# Patient Record
Sex: Female | Born: 1970 | Race: Black or African American | Hispanic: No | Smoking: Never smoker
Health system: Southern US, Community
[De-identification: ages and names within clinical notes are randomized; demographics above are authoritative.]

## PROBLEM LIST (undated history)

## (undated) DIAGNOSIS — E739 Lactose intolerance, unspecified: Secondary | ICD-10-CM

## (undated) DIAGNOSIS — I10 Essential (primary) hypertension: Secondary | ICD-10-CM

## (undated) DIAGNOSIS — K429 Umbilical hernia without obstruction or gangrene: Secondary | ICD-10-CM

## (undated) DIAGNOSIS — D259 Leiomyoma of uterus, unspecified: Secondary | ICD-10-CM

## (undated) DIAGNOSIS — Z86010 Personal history of colonic polyps: Secondary | ICD-10-CM

## (undated) DIAGNOSIS — G43109 Migraine with aura, not intractable, without status migrainosus: Secondary | ICD-10-CM

## (undated) DIAGNOSIS — D126 Benign neoplasm of colon, unspecified: Secondary | ICD-10-CM

## (undated) DIAGNOSIS — R109 Unspecified abdominal pain: Secondary | ICD-10-CM

## (undated) DIAGNOSIS — K589 Irritable bowel syndrome without diarrhea: Secondary | ICD-10-CM

## (undated) DIAGNOSIS — D649 Anemia, unspecified: Secondary | ICD-10-CM

## (undated) DIAGNOSIS — A749 Chlamydial infection, unspecified: Secondary | ICD-10-CM

## (undated) DIAGNOSIS — K219 Gastro-esophageal reflux disease without esophagitis: Secondary | ICD-10-CM

## (undated) DIAGNOSIS — K449 Diaphragmatic hernia without obstruction or gangrene: Secondary | ICD-10-CM

## (undated) DIAGNOSIS — A599 Trichomoniasis, unspecified: Secondary | ICD-10-CM

## (undated) DIAGNOSIS — J45909 Unspecified asthma, uncomplicated: Secondary | ICD-10-CM

## (undated) DIAGNOSIS — T7840XA Allergy, unspecified, initial encounter: Secondary | ICD-10-CM

## (undated) DIAGNOSIS — G4700 Insomnia, unspecified: Secondary | ICD-10-CM

## (undated) DIAGNOSIS — K5792 Diverticulitis of intestine, part unspecified, without perforation or abscess without bleeding: Secondary | ICD-10-CM

## (undated) DIAGNOSIS — K5909 Other constipation: Secondary | ICD-10-CM

## (undated) HISTORY — PX: DILATION AND CURETTAGE OF UTERUS: SHX78

## (undated) HISTORY — DX: Unspecified asthma, uncomplicated: J45.909

## (undated) HISTORY — PX: COLONOSCOPY: SHX174

## (undated) HISTORY — DX: Diverticulitis of intestine, part unspecified, without perforation or abscess without bleeding: K57.92

## (undated) HISTORY — DX: Gastro-esophageal reflux disease without esophagitis: K21.9

## (undated) HISTORY — DX: Unspecified abdominal pain: R10.9

## (undated) HISTORY — DX: Umbilical hernia without obstruction or gangrene: K42.9

## (undated) HISTORY — PX: TUBAL LIGATION: SHX77

## (undated) HISTORY — DX: Lactose intolerance, unspecified: E73.9

## (undated) HISTORY — DX: Migraine with aura, not intractable, without status migrainosus: G43.109

## (undated) HISTORY — DX: Chlamydial infection, unspecified: A74.9

## (undated) HISTORY — DX: Personal history of colonic polyps: Z86.010

## (undated) HISTORY — DX: Essential (primary) hypertension: I10

## (undated) HISTORY — DX: Anemia, unspecified: D64.9

## (undated) HISTORY — PX: APPENDECTOMY: SHX54

## (undated) HISTORY — DX: Trichomoniasis, unspecified: A59.9

## (undated) HISTORY — DX: Irritable bowel syndrome, unspecified: K58.9

## (undated) HISTORY — DX: Allergy, unspecified, initial encounter: T78.40XA

## (undated) HISTORY — DX: Diaphragmatic hernia without obstruction or gangrene: K44.9

## (undated) HISTORY — DX: Other constipation: K59.09

## (undated) HISTORY — DX: Benign neoplasm of colon, unspecified: D12.6

## (undated) HISTORY — DX: Leiomyoma of uterus, unspecified: D25.9

---

## 2006-06-07 ENCOUNTER — Emergency Department (HOSPITAL_COMMUNITY): Admission: EM | Admit: 2006-06-07 | Discharge: 2006-06-08 | Payer: Self-pay | Admitting: Emergency Medicine

## 2006-07-14 ENCOUNTER — Ambulatory Visit: Payer: Self-pay | Admitting: Obstetrics & Gynecology

## 2006-07-22 ENCOUNTER — Emergency Department (HOSPITAL_COMMUNITY): Admission: EM | Admit: 2006-07-22 | Discharge: 2006-07-22 | Payer: Self-pay | Admitting: Emergency Medicine

## 2006-08-13 ENCOUNTER — Ambulatory Visit: Payer: Self-pay | Admitting: Gastroenterology

## 2006-08-13 LAB — CONVERTED CEMR LAB
AST: 19 units/L (ref 0–37)
BUN: 10 mg/dL (ref 6–23)
Basophils Absolute: 0.1 10*3/uL (ref 0.0–0.1)
CO2: 27 meq/L (ref 19–32)
Calcium: 9.1 mg/dL (ref 8.4–10.5)
Chloride: 107 meq/L (ref 96–112)
Creatinine, Ser: 0.7 mg/dL (ref 0.4–1.2)
Folate: 9.2 ng/mL
GFR calc non Af Amer: 101 mL/min
Glucose, Bld: 111 mg/dL — ABNORMAL HIGH (ref 70–99)
Hemoglobin: 12.5 g/dL (ref 12.0–15.0)
MCV: 84.3 fL (ref 78.0–100.0)
Potassium: 3.8 meq/L (ref 3.5–5.1)
RBC: 4.34 M/uL (ref 3.87–5.11)
RDW: 12.8 % (ref 11.5–14.6)
Sed Rate: 29 mm/hr — ABNORMAL HIGH (ref 0–25)
Sodium: 141 meq/L (ref 135–145)
TSH: 1.19 microintl units/mL (ref 0.35–5.50)
Vitamin B-12: 300 pg/mL (ref 211–911)

## 2006-10-21 ENCOUNTER — Ambulatory Visit: Payer: Self-pay | Admitting: Obstetrics & Gynecology

## 2006-10-21 ENCOUNTER — Other Ambulatory Visit: Admission: RE | Admit: 2006-10-21 | Discharge: 2006-10-21 | Payer: Self-pay | Admitting: Obstetrics & Gynecology

## 2006-10-22 ENCOUNTER — Encounter: Payer: Self-pay | Admitting: Obstetrics & Gynecology

## 2006-11-19 ENCOUNTER — Ambulatory Visit: Payer: Self-pay | Admitting: Obstetrics & Gynecology

## 2007-01-05 ENCOUNTER — Ambulatory Visit: Payer: Self-pay | Admitting: Obstetrics and Gynecology

## 2007-01-10 ENCOUNTER — Ambulatory Visit (HOSPITAL_COMMUNITY): Admission: RE | Admit: 2007-01-10 | Discharge: 2007-01-10 | Payer: Self-pay | Admitting: Obstetrics & Gynecology

## 2007-05-11 DIAGNOSIS — R109 Unspecified abdominal pain: Secondary | ICD-10-CM | POA: Insufficient documentation

## 2007-05-11 DIAGNOSIS — K5909 Other constipation: Secondary | ICD-10-CM

## 2009-04-24 ENCOUNTER — Ambulatory Visit: Payer: Self-pay | Admitting: Obstetrics and Gynecology

## 2010-03-13 ENCOUNTER — Emergency Department (HOSPITAL_COMMUNITY): Payer: Medicaid Other

## 2010-03-13 ENCOUNTER — Emergency Department (HOSPITAL_COMMUNITY)
Admission: EM | Admit: 2010-03-13 | Discharge: 2010-03-13 | Disposition: A | Discharge status: Home / Self Care | Payer: Medicaid Other | Attending: Emergency Medicine | Admitting: Emergency Medicine

## 2010-03-13 DIAGNOSIS — M79609 Pain in unspecified limb: Secondary | ICD-10-CM | POA: Insufficient documentation

## 2010-03-13 DIAGNOSIS — X58XXXA Exposure to other specified factors, initial encounter: Secondary | ICD-10-CM | POA: Insufficient documentation

## 2010-03-13 DIAGNOSIS — M6580 Other synovitis and tenosynovitis, unspecified site: Secondary | ICD-10-CM | POA: Insufficient documentation

## 2010-06-10 NOTE — Assessment & Plan Note (Signed)
[facility]                         GASTROENTEROLOGY OFFICE NOTE   NAME:Briana Wade, Briana Wade                     MRN:          [id number]  DATE:08/13/2006                            DOB:          Oct 20, 1970    NEW PATIENT CONSULTATION   Briana Wade is a 40 year old African-American female referred by Dr.  Penne Lash for evaluation of lower abdominal discomfort.   HISTORY OF PRESENT ILLNESS:  This patient has had recurrent spells of  crampy lower abdominal pain with gas, bloating, mild constipation for  the last several years.  She has apparently been in the emergency room  on multiple occasions without any diagnosis being determined.  She has  recently been seen at the [facility] by Dr. Elsie Lincoln on July 14, 2006.  She was felt to have some uterine fibroids.  She placed her  on Depo-Provera as a symptomatic trial.  The patient apparently has had  recent pelvic ultrasound, which has shown fibroids.   The patient is status post appendectomy some 20 years ago and has had a  tubal ligation.  She never had other gastrointestinal surgeries,  endoscopic procedures, or x-rays.  She has some mild acid reflux  symptoms and nausea, but has had no associated dysphagia, clay-colored  stools, dark urine, icterus, et Karie Soda.  She denies any history of  hepatitis or pancreatitis.  She follows a regular diet, but does have  known lactose intolerance.  She denies use of sorbitol or fructose.  She  has had no anorexia, weight loss, fever, chills, skin rashes, joint  pains, or other systemic symptoms.   PAST MEDICAL HISTORY:  Otherwise, noncontributory.   MEDICATIONS:  None.   No allergies.   FAMILY HISTORY:  Noncontributory.   SOCIAL HISTORY:  The patient is single and has 3 children.  She denies  ever using alcohol or cigarettes.  She gives no history of drug  dependency.   REVIEW OF SYSTEMS:  Noncontributory, except for some painful periods and  deconditioning with shortness of breath on exertion.   EXAMINATION:  She is a healthy-appearing black female appearing her  stated age in no distress.  I cannot appreciate stigmata of chronic  liver disease.  There is no thyromegaly or lymphadenopathy.  She is 5 feet 2 inches tall and weighs 197 pounds.  Blood pressure  120/84, pulse 80 and regular.  CHEST:  Entirely clear and she was in a regular rhythm without murmurs,  gallops, or rubs.  ABDOMEN:  Obese, but I could appreciate no definite organomegaly, mass,  or significant tenderness.  Peripheral extremities were unremarkable.  Mental status was clear.  Inspection of the rectum was unremarkable, as was rectal exam.  Soft  stool present.  Guaiac negative.   ASSESSMENT:  I think Mrs. 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   RECOMMENDATIONS:  1. High fiber diet with daily Benefiber and liberal p.o. fluids.  2. Scheduled her for outpatient colonoscopy exam.  3. Check screening laboratory parameters.  4. Trial of p.r.n. Bentyl 10 mg every 6 to 8 hours.  5. P.r.n.  Lactaid tablet use with major dairy products.  6. Continue other medications per Dr. Penne Lash.     Vania Rea. Jarold Motto, MD, Caleen Essex, FAGA  Electronically Signed    DRP/MedQ  DD: 08/13/2006  DT: 08/13/2006  Job #: [id number]   cc:   Lesly Dukes, M.D.

## 2010-06-10 NOTE — Group Therapy Note (Signed)
NAME:  Briana Wade, Briana Wade              ACCOUNT NO.:  [id number]   MEDICAL RECORD NO.:  [id number]          PATIENT TYPE:  WOC   LOCATION:  [facility]                   FACILITY:  [facility]   PHYSICIAN:  Johnella Moloney, MD        DATE OF BIRTH:  [birth year]   DATE OF SERVICE:  10/21/2006                                  CLINIC NOTE   HISTORY OF PRESENT ILLNESS:  Patient is a 40 year old G 10, P 3-0-7-3,  who presents for followup.  She was last seen by Dr. Elsie Lincoln on  July 14, 2006, for pelvic pain.  Of note, patient had an ultrasound  which showed multiple uterine fibroids, the largest measuring 2 cm, and  a posterior uterine fundus.  At disposition was complaining of getting  very bloated and had alternating episodes of constipation and diarrhea.  An appointment was made for her with a gastroenterologist, and patient  did follow up on this appointment but was told to follow up for a  colonoscopy, which she did not follow up for.  Patient denies any  further pelvic pain or gastrointestinal symptoms at this visit.  However, given her pelvic pain, the patient was started on Depo-Provera  and got Depo-Provera 150 mg at her last visit.  She was told to come  back in six weeks for evaluation, but patient missed this visit.  Patient now reports that she has been having irregular bleeding for the  past five weeks where she has bleeding every day, it has been running  from spotting to using 3-4 pads a day.  She feels like this has  something to do with her Depo-Provera; this bleeding is not associated  with any pain or any other symptoms.  No interval change in medical  history.   PHYSICAL EXAMINATION:  VITAL SIGNS:  Temperature is 98.1, pulse 82,  blood pressure 134/90, weight 200.4 pounds.  GENERAL:  No apparent distress.  ABDOMEN:  Soft, obese, nontender.   Given patient's irregular bleeding, she was counseled regarding an  endometrial biopsy, and all questions were answered, informed  consent  was signed.   PELVIC:  She had normal external female genitalia.  Cervix was closed,  nontender.  Uterine size difficult to appreciate secondary to habitus.   ENDOMETRIAL BIOPSY PROCEDURE:  Patient was consented for endometrial  biopsy.  The 3-mm Pipelle was introduced into the patient's cervix to a  depth of 9 cm.  One pass was made, and a moderate amount of tissue was  recovered and sent to pathology for analysis.  Small amount of bleeding  noted at end of procedure.   ASSESSMENT AND PLAN:  Patient is a 40 year old with a fibroid uterus,  now with irregular bleeding.  Patient is now status post endometrial  biopsy.  Will follow up results.  If there is not a concern for  endometrial hyperplasia or further concerns on the endometrial biopsy,  patient will be a good candidate for the Mirena IUD.  She was given  information to review at home regarding the Mirena IUD and will decide  by her next visit if she wants this to  be placed.  The other options  that were discussed with the patient were continuing on her Depo-  Provera, further  hormonal treatment, or surgical management.  She will think about these  options and decide by her next visit.  Of note, patient reported that  she was not currently bleeding at this visit.           ______________________________  Johnella Moloney, MD     UD/MEDQ  D:  10/21/2006  T:  10/22/2006  Job:  [id number]
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
## 2010-06-10 NOTE — Group Therapy Note (Signed)
NAME:  Briana Wade, Briana Wade              ACCOUNT NO.:  [id number]   MEDICAL RECORD NO.:  [id number]          PATIENT TYPE:  WOC   LOCATION:  [facility]                   FACILITY:  [facility]   PHYSICIAN:  Johnella Moloney, MD        DATE OF BIRTH:  1970/06/04   DATE OF SERVICE:  11/19/2006                                  CLINIC NOTE   CHIEF COMPLAINT:  Followup results of endometrial biopsy and pelvic  pain.   HISTORY OF PRESENT ILLNESS:  This is a 40 year old gravida 10, para 3-0-  7-3, presenting for followup.  She was seen last by Dr. Silas Flood on  October 21, 2006.  Since that time she has had one visit which she  failed to keep on November 04, 2006.  Patient has a history of uterine  fibroids, the largest measuring 2 cm.  Patient has been seen in the past  for pelvic pain related to her menstrual cycles and was started on Depo-  Provera in June, 2008.  Patient reports that after her Depo shot she  began bleeding every day for the next 5 weeks.  She was assessed by Dr.  Silas Flood with an endometrial biopsy showing fragmented proliferative type  endometrium with no hyperplasia or malignancy identified.  Patient  states she has not had any bleeding since she was last seen here in  clinic.  She just began spotting yesterday and believes she is beginning  her menstrual cycle.  Prior to being on Depo she was having monthly  cycles in which she would bleed for 5 days and uses 3 to 4 pads a day.  She states that she currently would like to see how her periods progress  after she has been off Depo for 1 month.  At this time she would not  like any interventions, including oral contraceptive pills, Mirena IUD  or surgery.  The patient states that she would like to follow up if her  bleeding continues to worsen or pelvic pain returns.   PAST MEDICAL HISTORY:  1. Uterine fibroids.  2. No other medical problems.  3. Medications:  Tylenol p.r.n.   ALLERGIES:  No known diagnosed allergies.   PHYSICAL  EXAMINATION:  GENERAL:  This is a well-appearing African-  American female in no distress.  VITALS:  Pulse of 77, blood pressure 130/83, weight is 206 pounds.  CARDIOVASCULAR:  Heart is regular rate and rhythm.  No murmurs, rubs or  gallops.  RESPIRATORY:  Lungs are clear to auscultation bilaterally.  ABDOMEN:  Soft, obese, nontender to palpation.  EXTREMITIES:  No tenderness or edema.   ASSESSMENT AND PLAN:  This is a 40 year old female with a fibroid uterus  with history of irregular bleeding after Depo-Provera.  She has now been  without bleeding for the past four weeks.  Patient states she would not  like to try any new interventions at this time but if her pelvic pain  returns or if she develops more regular bleeding would like to follow up  to discuss her options.  Options discussed today included hormonal  therapy, Mirena intrauterine  device, and the need to  follow up for consideration of surgery.  The  patient states she will follow up as needed based on her symptoms.     ______________________________  Karlton Lemon, MD    ______________________________  Johnella Moloney, MD    NS/MEDQ  D:  11/19/2006  T:  11/19/2006  Job:  [id number]

## 2010-06-10 NOTE — Group Therapy Note (Signed)
NAME:  Briana Wade, Briana Wade NO.:  [id number]   MEDICAL RECORD NO.:  [id number]          PATIENT TYPE:  WOC   LOCATION:  [facility]                   FACILITY:  [facility]   PHYSICIAN:  Argentina Donovan, MD        DATE OF BIRTH:  February 08, 1970   DATE OF SERVICE:                                  CLINIC NOTE   The patient is a 40 year old African American female gravida 10, para 3-  0-7-3 with a history of tubal ligation who has had intractable  menometrorrhagia for almost 1 year now.  She has been tried on Depo-  Provera, which seem to have made it worse.  She has had an ultrasound  which showed a normal-sized uterus with a couple of small leiomyomata,  the largest 2 cm in diameter.   She has no other medical problems in the past.  She also has been  complaining of some left upper quadrant pain.  In an examination, her  abdomen is distended, tympanic.  She has no history of diarrhea,  constipation.  She does not pass more than normal flatus.  Her diet does  not seem to be something that would increase her intestinal gas to that  degree.  We are going to get a CT of the abdomen.  We are scheduling the  patient after giving her the full literature discussing in detail the  possible failures and complications of endometrial ablation.  This  uterus looks pretty normal, so were going to schedule her for a NovaSure  to try and see if that will relieve that problem for her.  If we find  nothing on the CT, we would probably have to refer the patient to  gastroenterologist, as she says she has had this problem with bloating  for almost 2 years.   IMPRESSION:  1. Intractable menometrorrhagia.  2. Abdominal bloating.           ______________________________  Argentina Donovan, MD     PR/MEDQ  D:  01/05/2007  T:  01/06/2007  Job:  [id number]

## 2010-07-02 ENCOUNTER — Other Ambulatory Visit: Payer: Self-pay | Admitting: Obstetrics and Gynecology

## 2010-07-02 ENCOUNTER — Ambulatory Visit (INDEPENDENT_AMBULATORY_CARE_PROVIDER_SITE_OTHER): Payer: Medicaid Other | Admitting: Obstetrics and Gynecology

## 2010-07-02 DIAGNOSIS — Z01419 Encounter for gynecological examination (general) (routine) without abnormal findings: Secondary | ICD-10-CM

## 2010-07-02 DIAGNOSIS — Z124 Encounter for screening for malignant neoplasm of cervix: Secondary | ICD-10-CM

## 2010-07-02 DIAGNOSIS — Z1231 Encounter for screening mammogram for malignant neoplasm of breast: Secondary | ICD-10-CM

## 2010-07-03 NOTE — Group Therapy Note (Signed)
NAME:  Briana Wade, Briana Wade NO.:  [id number]  MEDICAL RECORD NO.:  [id number]           PATIENT TYPE:  A  LOCATION:  [facility]                   FACILITY:  [facility]  PHYSICIAN:  Argentina Donovan, MD        DATE OF BIRTH:  Oct 25, 1970  DATE OF SERVICE:  07/02/2010                                 CLINIC NOTE  The patient is a 40 year old African American female gravida 10, para 3- 0-7-3 in for annual examination.  Her biggest concern is abdominal bloating.  She was scheduled for a colonoscopy 2 years ago but never got it done because she got ill.  So, we are going to go ahead and reschedule her to see a gastroenterologist.  At that time, we told her she had irritable colon.  She has had no abnormal bleeding and no other significant complaints.  PHYSICAL EXAMINATION:  NECK:  Her thyroid is symmetrical with no dominant masses. BREASTS:  Symmetrical.  No dominant masses.  No nipple discharge.  No supraclavicular or axillary nodes. ABDOMEN:  Soft, obtunded, tympanic, nontender.  No masses or organomegaly palpated. GENITALIA:  External os is normal.  BUS within normal limits.  Vagina is clean and well rugated.  The cervix is clean and parous.  Pap smear was taken.  The uterus appears to be of normal size, shape, consistency.  The patient's blood pressure today is 145/105 and 145/97.  It has always been normal one taken before.  So, I am going to repeat that prior to her discharge and if necessary refer her to the medical clinic.  IMPRESSION:  Normal gynecological examination, possible hypertension.          ______________________________ Argentina Donovan, MD    PR/MEDQ  D:  07/02/2010  T:  07/03/2010  Job:  [id number]

## 2010-07-16 ENCOUNTER — Ambulatory Visit (HOSPITAL_COMMUNITY)
Admission: RE | Admit: 2010-07-16 | Discharge: 2010-07-16 | Disposition: A | Discharge status: Home / Self Care | Payer: Medicaid Other | Source: Ambulatory Visit | Attending: Obstetrics and Gynecology | Admitting: Obstetrics and Gynecology

## 2010-07-16 DIAGNOSIS — Z1231 Encounter for screening mammogram for malignant neoplasm of breast: Secondary | ICD-10-CM | POA: Insufficient documentation

## 2010-07-21 ENCOUNTER — Encounter: Payer: Self-pay | Admitting: Gastroenterology

## 2010-07-22 ENCOUNTER — Inpatient Hospital Stay (HOSPITAL_COMMUNITY)
Admission: EM | Admit: 2010-07-22 | Discharge: 2010-07-24 | DRG: 103 | Disposition: A | Discharge status: Home / Self Care | Payer: Medicaid Other | Attending: Internal Medicine | Admitting: Internal Medicine

## 2010-07-22 DIAGNOSIS — I10 Essential (primary) hypertension: Secondary | ICD-10-CM | POA: Diagnosis present

## 2010-07-22 DIAGNOSIS — G44209 Tension-type headache, unspecified, not intractable: Principal | ICD-10-CM | POA: Diagnosis present

## 2010-07-23 ENCOUNTER — Emergency Department (HOSPITAL_COMMUNITY): Payer: Medicaid Other

## 2010-07-23 ENCOUNTER — Encounter (HOSPITAL_COMMUNITY): Payer: Self-pay

## 2010-07-23 ENCOUNTER — Inpatient Hospital Stay (HOSPITAL_COMMUNITY): Payer: Medicaid Other

## 2010-07-23 LAB — DIFFERENTIAL
Basophils Relative: 0 % (ref 0–1)
Eosinophils Absolute: 0.1 10*3/uL (ref 0.0–0.7)
Eosinophils Relative: 2 % (ref 0–5)
Lymphs Abs: 2.4 10*3/uL (ref 0.7–4.0)
Monocytes Relative: 9 % (ref 3–12)

## 2010-07-23 LAB — BASIC METABOLIC PANEL
Calcium: 9.5 mg/dL (ref 8.4–10.5)
Creatinine, Ser: 0.77 mg/dL (ref 0.50–1.10)
Glucose, Bld: 98 mg/dL (ref 70–99)
Sodium: 135 mEq/L (ref 135–145)

## 2010-07-23 LAB — CBC
Hemoglobin: 11.9 g/dL — ABNORMAL LOW (ref 12.0–15.0)
MCH: 27.9 pg (ref 26.0–34.0)
MCHC: 33 g/dL (ref 30.0–36.0)
RDW: 13.3 % (ref 11.5–15.5)

## 2010-07-23 LAB — TSH: TSH: 2.52 u[IU]/mL (ref 0.350–4.500)

## 2010-07-23 MED ORDER — GADOBENATE DIMEGLUMINE 529 MG/ML IV SOLN
20.0000 mL | Freq: Once | INTRAVENOUS | Status: AC | PRN
  Administered 2010-07-23: 20 mL via INTRAVENOUS

## 2010-07-24 LAB — BASIC METABOLIC PANEL
GFR calc Af Amer: >60 mL/min (ref >60)
GFR calc non Af Amer: >60 mL/min (ref >60)
Glucose, Bld: 126 mg/dL — ABNORMAL HIGH (ref 70–99)
Sodium: 137 mEq/L (ref 135–145)

## 2010-07-26 NOTE — H&P (Signed)
  NAMEMACEY, WURTZ NO.:  [id number]  MEDICAL RECORD NO.:  [id number]  LOCATION:  1337                         FACILITY:  [facility]  PHYSICIAN:  Briana Siren, MD           DATE OF BIRTH:  [birth year]  DATE OF ADMISSION:  07/22/2010 DATE OF DISCHARGE:                             HISTORY & PHYSICAL   PRIMARY CARE PHYSICIAN:  None.  REASON FOR ADMISSION:  Intractable headache.  ADVANCE DIRECTIVE:  Full code.  HISTORY OF PRESENT ILLNESS:  [name] is a 40 year old female with a history of hypertension, on no chronic medication; migraine headache; question of prior CVA who presented to the emergency room with 4 days of vertex headache.  She also has mild nausea, but not having severe vomiting.  Her complaint overall was rather vague, but she complained of no focal weakness, stiff neck, visual changes, or slurred speech.  She has not had any fever or chills.  She denied any distant travel or any ill contacts.  Evaluation in the emergency room showed an old lacunar infarct with her head CT.  She has a normal creatinine and a normal Hegna count.  She was given pain medication; however, continued to have headache and nausea, and hospitalist was thus asked to admit her for intractable headache.  In the past, she has much milder headaches and much rarer.  PAST MEDICAL HISTORY:  Hypertension.  SOCIAL HISTORY:  She is currently unemployed.  She is not married.  She has children.  She denied tobacco, alcohol, or drug use.  ALLERGIES:  No known drug allergies.  CURRENT MEDICATIONS:  On no chronic medications.  FAMILY HISTORY:  Noncontributory.  PHYSICAL EXAMINATION:  VITAL SIGNS:  Blood pressure 115 over 103, original blood pressure 136/85; pulse 80, respiratory rate of 16; temperature 98.3. GENERAL:  She is alert and conversing. HEENT:  She has facial symmetry with her speech is slightly slurred. Tongue is midline.  Uvula elevated with phonation.  Pupils equal,  round, reactive to light and accommodation.  Funduscopic exam is benign. Throat is clear. NECK:  Supple. CARDIAC:  S1, S2.  Regular.  I did not hear any murmur, rub, or gallop. LUNGS:  Clear. ABDOMEN:  Soft, nondistended, nontender. EXTREMITIES:  No edema.  She has good distal pulses bilaterally.  Her strength equal. NEURO:  Cerebellar exam is intact.  OBJECTIVE FINDINGS:  As above.  IMPRESSION:  This is a 40 year old female with known history of migraine and hypertension presenting with intractable headache.  I suspect that she has status migrainous. It is highly unlikely that she has an intracranial infection.  We will get her admitted for pain control.  I like to get an MRI with and without contrast of her head, given that her pattern of her migraine has changed.  We will admit her to [facility].  She is stable.     Briana Siren, MD     PL/MEDQ  D:  07/23/2010  T:  07/23/2010  Job:  [id number]  Electronically Signed by Briana Wade  on 07/26/2010 01:22:22 AM

## 2010-08-05 ENCOUNTER — Emergency Department (HOSPITAL_COMMUNITY): Payer: Medicaid Other

## 2010-08-05 ENCOUNTER — Emergency Department (HOSPITAL_COMMUNITY)
Admission: EM | Admit: 2010-08-05 | Discharge: 2010-08-05 | Disposition: A | Discharge status: Home / Self Care | Payer: Medicaid Other | Attending: Emergency Medicine | Admitting: Emergency Medicine

## 2010-08-05 DIAGNOSIS — I10 Essential (primary) hypertension: Secondary | ICD-10-CM | POA: Insufficient documentation

## 2010-08-05 DIAGNOSIS — R05 Cough: Secondary | ICD-10-CM | POA: Insufficient documentation

## 2010-08-05 DIAGNOSIS — G4440 Drug-induced headache, not elsewhere classified, not intractable: Secondary | ICD-10-CM | POA: Insufficient documentation

## 2010-08-05 DIAGNOSIS — R059 Cough, unspecified: Secondary | ICD-10-CM | POA: Insufficient documentation

## 2010-08-05 DIAGNOSIS — R0602 Shortness of breath: Secondary | ICD-10-CM | POA: Insufficient documentation

## 2010-08-21 ENCOUNTER — Encounter: Payer: Self-pay | Admitting: *Deleted

## 2010-08-26 ENCOUNTER — Ambulatory Visit (INDEPENDENT_AMBULATORY_CARE_PROVIDER_SITE_OTHER): Payer: Medicaid Other | Admitting: Gastroenterology

## 2010-08-26 ENCOUNTER — Other Ambulatory Visit: Payer: Medicaid Other

## 2010-08-26 ENCOUNTER — Other Ambulatory Visit (INDEPENDENT_AMBULATORY_CARE_PROVIDER_SITE_OTHER): Payer: Medicaid Other

## 2010-08-26 ENCOUNTER — Other Ambulatory Visit: Payer: Self-pay | Admitting: *Deleted

## 2010-08-26 ENCOUNTER — Encounter: Payer: Self-pay | Admitting: Gastroenterology

## 2010-08-26 DIAGNOSIS — E739 Lactose intolerance, unspecified: Secondary | ICD-10-CM

## 2010-08-26 DIAGNOSIS — R141 Gas pain: Secondary | ICD-10-CM

## 2010-08-26 DIAGNOSIS — R143 Flatulence: Secondary | ICD-10-CM

## 2010-08-26 DIAGNOSIS — R140 Abdominal distension (gaseous): Secondary | ICD-10-CM | POA: Insufficient documentation

## 2010-08-26 DIAGNOSIS — K589 Irritable bowel syndrome without diarrhea: Secondary | ICD-10-CM

## 2010-08-26 DIAGNOSIS — R109 Unspecified abdominal pain: Secondary | ICD-10-CM

## 2010-08-26 LAB — BASIC METABOLIC PANEL
BUN: 11 mg/dL (ref 6–23)
CO2: 28 mEq/L (ref 19–32)
Chloride: 103 mEq/L (ref 96–112)
Glucose, Bld: 82 mg/dL (ref 70–99)
Potassium: 4 mEq/L (ref 3.5–5.1)

## 2010-08-26 LAB — HEPATIC FUNCTION PANEL
AST: 17 U/L (ref 0–37)
Albumin: 4 g/dL (ref 3.5–5.2)
Alkaline Phosphatase: 58 U/L (ref 39–117)
Total Protein: 7.2 g/dL (ref 6.0–8.3)

## 2010-08-26 LAB — FERRITIN: Ferritin: 36.3 ng/mL (ref 10.0–291.0)

## 2010-08-26 LAB — CBC WITH DIFFERENTIAL/PLATELET
Basophils Absolute: 0 10*3/uL (ref 0.0–0.1)
Eosinophils Absolute: 0.1 10*3/uL (ref 0.0–0.7)
HCT: 40.7 % (ref 36.0–46.0)
Lymphocytes Relative: 36.8 % (ref 12.0–46.0)
Lymphs Abs: 1.6 10*3/uL (ref 0.7–4.0)
Monocytes Relative: 7.9 % (ref 3.0–12.0)
Platelets: 298 10*3/uL (ref 150.0–400.0)
RDW: 14.3 % (ref 11.5–14.6)

## 2010-08-26 LAB — AMYLASE: Amylase: 64 U/L (ref 27–131)

## 2010-08-26 LAB — LIPASE: Lipase: 30 U/L (ref 11.0–59.0)

## 2010-08-26 MED ORDER — RABEPRAZOLE SODIUM 20 MG PO TBEC: 20.0000 mg | DELAYED_RELEASE_TABLET | Freq: Every day | ORAL | Status: DC

## 2010-08-26 MED ORDER — ESOMEPRAZOLE MAGNESIUM 40 MG PO CPDR: 40.0000 mg | DELAYED_RELEASE_CAPSULE | Freq: Every day | ORAL | Status: DC

## 2010-08-26 MED ORDER — PEG-KCL-NACL-NASULF-NA ASC-C 100 G PO SOLR: 1.0000 | Freq: Once | ORAL | Status: DC

## 2010-08-26 MED ORDER — HYOSCYAMINE SULFATE 0.125 MG SL SUBL: 0.1250 mg | SUBLINGUAL_TABLET | SUBLINGUAL | Status: AC | PRN

## 2010-08-26 NOTE — Telephone Encounter (Signed)
Insurance will not cover aciphex but will cover Neixum and omeprazole. I have sent a rx for Nexium.

## 2010-08-26 NOTE — Progress Notes (Signed)
History of Present Illness:  This is a very nice 40 year old African American female self-referred for evaluation of diffuse abdominal pain with gas and bloating over the last several years. She was initially seen in consultation for her by Dr. Elsie Lincoln. At that time she was felt to have constipation predominant IBS, but did not keep her colonoscopy appointment. She is status post appendectomy and tubal ligation. Reviewing her record, she has had multiple emergency room visits for abdominal pain and recurrent headaches. Her history is positive for lactose intolerance. Previous labs were unremarkable. She now describes diffuse abdominal bloating, gas, and episodes of nausea with some vomiting. She has minimal reflux symptoms and denies dysphagia, or any specific hepatobiliary complaints. Her appetite is good her weight is stable. She does avoid major lactose products. There is no history of melena, hematochezia, fever, chills, or other systemic complaints. Family history is noncontributory. She denies abuse of NSAIDs, alcohol, or cigarettes. She is on regular oxycodone for headaches. Also Norvasc 10 mg a day for essential hypertension.    I have reviewed this patient's present history, medical and surgical past history, allergies and medications.     ROS: The remainder of the 10 point ROS is negative.. she does complain of chronic back pain, frequent headaches, and deconditioning with some peripheral edema. I cannot elicit any other specific cardiovascular, pulmonary, neurologic or genitourinary problems. Her last menstrual period was over yesterday.  Past Medical History  Diagnosis Date  . Other constipation   . Abdominal pain, unspecified site   . Hypertension   . Migraine   . IBS (irritable bowel syndrome)   . Umbilical hernia   . Uterine fibroid   . Chlamydia    Past Surgical History  Procedure Date  . Appendectomy   . Tubal ligation     reports that she has never smoked. She has  never used smokeless tobacco. She reports that she does not drink alcohol or use illicit drugs. family history is negative for Colon cancer. No Known Allergies      Physical Exam: General well developed well nourished patient in no acute distress, appearing her stated age..obese Eyes PERRLA, no icterus, fundoscopic exam per opthamologist Skin no lesions noted Neck supple, no adenopathy, no thyroid enlargement, no tenderness Chest clear to percussion and auscultation Heart no significant murmurs, gallops or rubs noted Abdomen no hepatosplenomegaly masses or tenderness, BS normal.  Rectal inspection normal no fissures, or fistulae noted.  No masses or tenderness on digital exam. Stool guaiac negative. Extremities no acute joint lesions, edema, phlebitis or evidence of cellulitis. Neurologic patient oriented x 3, cranial nerves intact, no focal neurologic deficits noted. Psychological mental status normal and normal affect.  Assessment and plan: Rule out cholelithiasis with associated biliary colic. I suspect she has lactose intolerance with irritable bowel syndrome and alternating diarrhea and constipation. I have scheduled upper abdominal ultrasound exam, laboratory review, and also followup colonoscopy and endoscopy as previously recommended. She's been placed on AcipHex 20 mg a day with when necessary sublingual Levsin to use, and a lactose free diet. Have also asked her to avoid sorbitol and fructose in her diet. Her procedures will be done with propofol because of her chronic oxycodone use. Patient also has morbid obesity and we may need to consider bariatric surgery.  Encounter Diagnoses  Name Primary?  . Abdominal pain   . Bloating

## 2010-08-26 NOTE — Patient Instructions (Signed)
Your procedure has been scheduled for 09/12/2010, please follow the seperate instructions.  Please go to the basement today for your labs.  Your prescription(s) have been sent to you pharmacy.  Your abdominal ultrasound is scheduled for 08/28/2010 arrive at [facility] at 9:00am, please have nothing to eat or drink after midnight.   Lactose Free Diet Lactose is a carbohydrate that is found mainly in milk and milk products, as well as in foods with added milk or whey. Lactose must be digested by the enzyme in order to be used by the body. Lactose intolerance occurs when there is a shortage of lactase. When your body is not able to digest lactose, you may feel sick to your stomach (nausea), bloating, cramping, gas and diarrhea. TYPES OF LACTASE DEFICIENCY  Primary lactase deficiency. This is the most common type. It is characterized by a slow decrease in lactase activity.   Secondary lactase deficiency. This occurs following injury to the small intestinal mucosa as a result of diseases such as celiac disease, nontropical sprue, infectious gastroenteritis (stomach virus), malnutrition, parasites, or inflammatory bowel disease. It can also occur after treatment with medications that kill germs (antibiotics) or cancer drugs, or as a result of surgery.  Tolerance to lactose varies widely, and each person must determine how much milk can be consumed without developing symptoms. Drinking smaller portions of milk throughout the day may be helpful. Some studies suggest that slowing gastric emptying may help increase tolerance of milk products. This may be done by:  Consuming milk or milk products with a meal rather than alone.   Using milk with a higher fat content.  There are many dairy products that may be tolerated better than milk by some people:  Cheese (especially aged cheese) - the lactose content is much lower than in milk.   The use of cultured dairy products such as yogurt, buttermilk,  cottage cheese, and sweet acidophilus milk (Kefir) for lactase-deficient individuals is usually well tolerated. This is because the healthy bacteria help digest lactose.   Lactose-hydrolyzed milk (Lactaid) contains 40-90% less lactose than milk and may also be well tolerated.  ADEQUACY These diets may be deficient in calcium, riboflavin, and vitamin D, according to the Recommended Dietary Allowances of the [employer]. Depending on individual tolerances and the use of milk substitutes, milk, or other dairy products, these recommendations may be met. SPECIAL NOTES  Lactose is a carbohydrates. The major food source is dairy products. Reading food labels is important. Many products contain lactose even when they are not made from milk. Look for the following words: whey, milk solids, dry milk solids, nonfat dry milk powder. Typical sources of lactose other than dairy products include breads, candies, cold cuts, prepared and processed foods, and commercial sauces and gravies.   All foods must be prepared without milk, cream, or other dairy foods.   A vitamin/mineral supplement may be necessary. Consult your physician or Registered Dietitian.   Lactose also is found in many prescription and over-the-counter medications.   Soy milk and lactose-free supplements may be used as an alternative to milk.  FOOD GROUP ALLOWED/RECOMMENDED AVOID/USE SPARINGLY  BREADS / STARCHES 4 servings or more* Breads and rolls made without milk. [place], [place], or [place] bread. Breads and rolls that contain milk. Prepared mixes such as muffins, biscuits, waffles, pancakes. Sweet rolls, donuts, [place] toast (if made with milk or lactose).  Crackers: Soda crackers, graham crackers. Any crackers prepared without lactose. Zwieback crackers, corn curls, or any that  contain lactose.  Cereals: Cooked or dry cereals prepared without lactose (read labels). Cooked or dry cereals prepared with lactose (read labels).  Total, Cocoa Krispies. Special K.  Potatoes / Pasta / Rice: Any prepared without milk or lactose. Popcorn. Instant potatoes, frozen [place] fries, scalloped or au gratin potatoes.  VEGETABLES 2 servings or more Fresh, frozen, and canned vegetables. Creamed or breaded vegetables. Vegetables in a cheese sauce or with lactose-containing margarines.  FRUIT 2 servings or more All fresh, canned, or frozen fruits that are not processed with lactose. Any canned or frozen fruits processed with lactose.  MEAT & SUBSTITUTES 2 servings or more (4 to 6 oz. total per day) Plain beef, chicken, fish, [place], lamb, veal, pork, or ham. Kosher prepared meat products. Strained or junior meats that do not contain milk. Eggs, soy meat substitutes, nuts. Scrambled eggs, omelets, and souffles that contain milk. Creamed or breaded meat, fish, or fowl. Sausage products such as wieners, liver sausage, or cold cuts that contain milk solids. Cheese, cottage cheese, or cheese spreads.  MILK None. (See "BEVERAGES" for milk substitutes. See "DESSERTS" for ice cream and frozen desserts.) Milk (whole, 2%, skim, or chocolate). Evaporated, powdered, or condensed milk; malted milk.  SOUPS & COMBINATION FOODS Bouillon, broth, vegetable soups, clear soups, consomms. Homemade soups made with allowed ingredients. Combination or prepared foods that do not contain milk or milk products (read labels). Cream soups, chowders, commercially prepared soups containing lactose. Macaroni and cheese, pizza. Combination or prepared foods that contain milk or milk products.  DESSERTS & SWEETS In moderation Water and fruit ices; gelatin; angel food cake. Homemade cookies, pies, or cakes made from allowed ingredients. Pudding (if made with water or a milk substitute). Lactose-free tofu desserts. Sugar, honey, corn syrup, jam, jelly; marmalade; molasses (beet sugar); Pure sugar candy; marshmallows. Ice cream, ice milk, sherbet, custard, pudding, frozen  yogurt. Commercial cake and cookie mixes. Desserts that contain chocolate. Pie crust made with milk-containing margarine; reduced-calorie desserts made with a sugar substitute that contains lactose. Toffee, peppermint, butterscotch, chocolate, caramels.  FATS & OILS In moderation Butter (as tolerated; contains very small amounts of lactose). Margarines and dressings that do not contain milk, Vegetable oils, shortening, Miracle Whip, mayonnaise, nondairy cream & whipped toppings without lactose or milk solids added (examples: Coffee Rich, Carnation Coffeemate, Rich's Whipped Topping, PolyRich). Tomasa Blase. Margarines and salad dressings containing milk; cream, cream cheese; peanut butter with added milk solids, sour cream, chip dips, made with sour cream.  BEVERAGES Carbonated drinks; tea; coffee and freeze-dried coffee; some instant coffees (check labels). Fruit drinks; fruit and vegetable juice; Rice or Soy milk. Ovaltine, hot chocolate. Some cocoas; some instant coffees; instant iced teas; powdered fruit drinks (read labels).   CONDIMENTS / MISCELLANEOUS Soy sauce, carob powder, olives, gravy made with water, baker's cocoa, pickles, pure seasonings and spices, wine, pure monosodium glutamate, catsup, mustard. Some chewing gums, chocolate, some cocoas. Certain antibiotics and vitamin / mineral preparations. Spice blends if they contain milk products. MSG extender. Artificial sweeteners that contain lactose such as Equal (Nutra-Sweet) and Sweet 'n Low. Some nondairy creamers (read labels).  * These amounts indicate the minimum number of servings needed from the basic food groups to provide a variety of nutrients essential to good health. A maximum amount is listed if intake of certain foods must be controlled. Combination foods may count as full or partial servings from the food groups. Dark green, leafy, or orange vegetables are recommended 3 or 4 times weekly to provide vitamin A.  A good source of vitamin  C is recommended daily. Potatoes may be included as a serving of vegetables. SAMPLE MENU*  Breakfast   Orange Juice.  Banana.   Bran flakes.   Nondairy Creamer.  Vienna Bread (toasted).   Butter or milk-free margarine.   Coffee or tea.    Noon Meal   Chicken Breast.  Rice.   Green beans.   Butter or milk-free margarine.  Fresh melon.   Coffee or tea.    Evening Meal   Roast Beef.  Baked potato.   Butter or milk-free margarine.   Broccoli.   Lettuce salad with vinegar and oil dressing.  [employer].   Coffee or tea.   Document Released: 07/04/2001 Document Re-Released: 07/11/2007 [facility] Patient Information 2011 [place], [place].

## 2010-08-27 ENCOUNTER — Telehealth: Payer: Self-pay | Admitting: *Deleted

## 2010-08-27 ENCOUNTER — Encounter: Payer: Self-pay | Admitting: *Deleted

## 2010-08-27 LAB — CELIAC PANEL 10
Gliadin IgG: 28.2 U/mL — ABNORMAL HIGH (ref <20)
Tissue Transglut Ab: 11.6 U/mL (ref <20)

## 2010-08-27 NOTE — Telephone Encounter (Signed)
Informed pt she needs to follow a Gluten Free Diet for 2 months and then f/u with Dr Jarold Motto on 10/21/10 at 10:30am. Pt stated understanding.

## 2010-08-27 NOTE — Telephone Encounter (Signed)
Message copied by Florene Glen on Wed Aug 27, 2010  4:42 PM ------      Message from: Jarold Motto, DAVID R      Created: Wed Aug 27, 2010  3:21 PM       GLUTEN SENSITIVITY...TRIAL OF GLUTEN FREE DIET FOR 2 MOS,THEN SEE ME.Marland Kitchen

## 2010-08-28 ENCOUNTER — Ambulatory Visit (HOSPITAL_COMMUNITY)
Admission: RE | Admit: 2010-08-28 | Discharge: 2010-08-28 | Disposition: A | Discharge status: Home / Self Care | Payer: Medicaid Other | Source: Ambulatory Visit | Attending: Gastroenterology | Admitting: Gastroenterology

## 2010-08-28 DIAGNOSIS — R140 Abdominal distension (gaseous): Secondary | ICD-10-CM

## 2010-08-28 DIAGNOSIS — R109 Unspecified abdominal pain: Secondary | ICD-10-CM | POA: Insufficient documentation

## 2010-09-12 ENCOUNTER — Ambulatory Visit (AMBULATORY_SURGERY_CENTER): Payer: Medicaid Other | Admitting: Gastroenterology

## 2010-09-12 DIAGNOSIS — Z860100 Personal history of colon polyps, unspecified: Secondary | ICD-10-CM

## 2010-09-12 DIAGNOSIS — R141 Gas pain: Secondary | ICD-10-CM

## 2010-09-12 DIAGNOSIS — D126 Benign neoplasm of colon, unspecified: Secondary | ICD-10-CM

## 2010-09-12 DIAGNOSIS — R143 Flatulence: Secondary | ICD-10-CM

## 2010-09-12 DIAGNOSIS — K589 Irritable bowel syndrome without diarrhea: Secondary | ICD-10-CM

## 2010-09-12 DIAGNOSIS — E739 Lactose intolerance, unspecified: Secondary | ICD-10-CM

## 2010-09-12 DIAGNOSIS — R109 Unspecified abdominal pain: Secondary | ICD-10-CM

## 2010-09-12 DIAGNOSIS — R140 Abdominal distension (gaseous): Secondary | ICD-10-CM

## 2010-09-12 HISTORY — DX: Personal history of colonic polyps: Z86.010

## 2010-09-12 HISTORY — DX: Personal history of colon polyps, unspecified: Z86.0100

## 2010-09-12 MED ORDER — SODIUM CHLORIDE 0.9 % IV SOLN: 500.0000 mL | INTRAVENOUS | Status: DC

## 2010-09-12 NOTE — Patient Instructions (Signed)
Please review discharge instructions (blue and green sheets)  Call Hulan Saas at Dr. Norval Gable office on Monday  Follow up appointment with Dr. Jarold Motto on 10-21-10 at 3:45  No Aspirin or NSAIDS for 2 weeks

## 2010-09-15 ENCOUNTER — Telehealth: Payer: Self-pay | Admitting: *Deleted

## 2010-09-15 NOTE — Telephone Encounter (Signed)
Follow up Call- Patient questions:  Do you have a fever, pain , or abdominal swelling? no Pain Score  0 *  Have you tolerated food without any problems? yes  Have you been able to return to your normal activities? yes  Do you have any questions about your discharge instructions: Diet   no Medications  no Follow up visit  no  Do you have questions or concerns about your Care? no  Actions: * If pain score is 4 or above: No action needed, pain <4. Patient questioned that she has not had a bowel movement, denies pain and is able to eat .Informed to call if no bowel movement by Tuesday.

## 2010-09-17 ENCOUNTER — Encounter: Payer: Self-pay | Admitting: Gastroenterology

## 2010-10-15 ENCOUNTER — Encounter: Payer: Self-pay | Admitting: *Deleted

## 2010-10-21 ENCOUNTER — Ambulatory Visit: Payer: Medicaid Other | Admitting: Gastroenterology

## 2010-10-21 ENCOUNTER — Ambulatory Visit (INDEPENDENT_AMBULATORY_CARE_PROVIDER_SITE_OTHER): Payer: Medicaid Other | Admitting: Gastroenterology

## 2010-10-21 ENCOUNTER — Encounter: Payer: Self-pay | Admitting: Gastroenterology

## 2010-10-21 VITALS — BP 124/88 | HR 84 | Ht 61.0 in | Wt 203.0 lb

## 2010-10-21 DIAGNOSIS — K900 Celiac disease: Secondary | ICD-10-CM

## 2010-10-21 DIAGNOSIS — K9041 Non-celiac gluten sensitivity: Secondary | ICD-10-CM

## 2010-10-21 DIAGNOSIS — Z860100 Personal history of colon polyps, unspecified: Secondary | ICD-10-CM | POA: Insufficient documentation

## 2010-10-21 DIAGNOSIS — K219 Gastro-esophageal reflux disease without esophagitis: Secondary | ICD-10-CM | POA: Insufficient documentation

## 2010-10-21 NOTE — Progress Notes (Signed)
History of Present Illness: This is a 40 year old African American female with positive IgG celiac serologies who has markedly improved in terms of her bowel irregularity, gas and bloating with a gluten-free diet. She also is on Nexium 40 mg a day for GERD, denies current symptoms. Colonoscopy revealed multiple adenomatous colon polyps which were removed without difficulty. Family history is noncontributory.    Current Medications, Allergies, Past Medical History, Past Surgical History, Family History and Social History were reviewed in [employer] record.   Assessment and plan: View of her colonoscopy shows a large adenomatous polyp. Because of her young age, I would recommend followup in 1 years. I've talked with her about gluten sensitivity and we will continue a gluten-free diet, and at the time of her followup colonoscopy recommended also endoscopy and small bowel biopsy. Also I reviewed her reflux regime with her and we'll continue daily Nexium. Encounter Diagnoses  Name Primary?  . GERD (gastroesophageal reflux disease) Yes  . Gluten intolerance   . Personal history of colonic polyps

## 2010-10-21 NOTE — Patient Instructions (Signed)
When you are out of refills for your Nexium please contact your pharmacy.   Celiac Disease Celiac disease is a digestive disease that causes your body's natural defense system (immune system) to react against its own cells. It interferes with taking in (absorbing) nutrients from food. Celiac disease is also known as celiac sprue, nontropical sprue, and gluten-sensitive enteropathy. People who have celiac disease cannot tolerate gluten. Gluten is a protein found in wheat, rye, and barley. With time, celiac disease will damage the cells lining the small intestine. This leads to being unable to absorb nutrients from food (malabsorption), diarrhea, and nutritional problems. CAUSES Celiac disease is genetic. This means you have a higher likelihood of getting the disease if someone in your family has or has had it. Up to 10% of your close relatives (parent, sibling, child) may also have the disease.  People with celiac disease tend to have other autoimmune diseases. The link may be genetic. These diseases include:  Dermatitis herpetiformis.  Thyroid disease.   Systemic lupus erythematosis.   Type 1 diabetes.   Liver disease.  Collagen vascular disease.   Rheumatoid arthritis.   Sjgren's syndrome.   SYMPTOMS The symptoms of celiac disease vary from person to person. The symptoms are generally digestive or nutritional. Digestive symptoms include:  Recurring belly (abdominal) bloating and pain.  Gas.   Long-term (chronic) diarrhea.  Pale, bad-smelling, greasy, or oily stool.   Nutritional symptoms include:  Failure to thrive in infants.  Delayed growth in children.   Weight loss in children and adults.   Missed menstrual periods (often due to extreme weight loss).   Anemia.   Weakening bones (osteoporosis).  Fatigue and weakness.   Tingling or other signs of nerve damage (peripheral neuropathy).   Depression.   DIAGNOSIS If your symptoms and physical exam suggest that a  digestive disorder or malnutrition is present, your caregiver may suspect celiac disease. You may have already begun a gluten-free diet. If symptoms persist, testing may be needed to confirm the diagnosis. Some tests are best done while you are on a normal, unrestricted diet. Tests may include:  Blood tests to check for nutritional deficiencies.   Blood tests to look for evidence that the body is producing antibodies against its own small intestine cells.   Taking a tissue sample (biopsy) from the small bowel for evaluation.   X-rays of the small bowel.   Evaluating the stool for fat.   Tests to check for nutrient absorption from the intestine.  TREATMENT It is important to seek treatment. Untreated celiac disease can cause growth problems (in children), anemia, osteoporosis, and possible nerve problems. A pregnant patient with untreated celiac disease has a higher risk of miscarriage, and the fetus has an increased risk of low birth weight and other growth problems. If celiac disease is diagnosed in the early stages, treatment can allow you to live a long, nearly symptom-free life. Treatment includes following a gluten-free diet. This means avoiding all foods that contain gluten. Eating even a small amount of gluten can damage your intestine. For most people, following this diet will stop symptoms. It will heal existing intestinal damage and prevent further damage. Improvements begin within days of starting the diet. The small intestine is usually completely healed within 3 to 6 months, or it may take up to 2 years for older adults. A small percentage of people do not improve on the gluten-free diet. Depending on your age and the stage at which you were diagnosed, some problems  such as delayed growth and discolored teeth may not improve. Sometimes, damaged intestines cannot heal. If your intestines are not absorbing enough nutrients, you may need to receive nutrition supplements through an  intravenous (IV) tube. Drug treatments are being tested for unresponsive celiac disease. In this case, you may need to be evaluated for complications of the disease. Your caregiver may also recommend:  A pneumonia vaccination.   Nutrients and other treatments for any nutritional deficiencies.  Your caregiver can provide you with more information on a gluten-free diet. Discussion with a dietician skilled in this illness will be valuable. Support groups may also be helpful. HOME CARE INSTRUCTIONS  Focus on a gluten-free diet. This diet must become a way of life.   Monitor your response to the gluten-free diet and treat any nutritional deficiencies.   Prepare ahead of time if you decide to eat outside the home.   Make and keep your regular follow-up visits with your caregiver.   Suggest to family members that they get screened for early signs of the disease.  SEEK MEDICAL CARE IF:  You continue to have digestive symptoms (gas, cramping, diarrhea) despite a proper diet.   You have trouble sticking to the gluten-free diet.   You develop an itchy rash with groups of tiny blisters.   You develop severe weakness, balance problems, menstrual problems, or depression.  Document Released: 01/12/2005 Document Re-Released: 07/02/2009 [facility] Patient Information 2011 [place], [place].    Polyps, Colon  A polyp is extra tissue that grows inside your body. Colon polyps grow in the large intestine. The large intestine, also called the colon, is part of your digestive system. It is a long, hollow tube at the end of your digestive tract where your body makes and stores stool. Most polyps are not dangerous. They are benign. This means they are not cancerous. But over time, some types of polyps can turn into cancer. Polyps that are smaller than a pea are usually not harmful. But larger polyps could someday become or may already be cancerous. To be safe, doctors remove all polyps and test them.  WHO GETS  POLYPS? Anyone can get polyps, but certain people are more likely than others. You may have a greater chance of getting polyps if:  You are over 50.   You have had polyps before.   Someone in your family has had polyps.   Someone in your family has had cancer of the large intestine.   Find out if someone in your family has had polyps. You may also be more likely to get polyps if you:   Eat a lot of fatty foods   Smoke   Drink alcohol   Do not exercise  Eat too much  SYMPTOMS Most small polyps do not cause symptoms. People often do not know they have one until their caregiver finds it during a regular checkup or while testing them for something else. Some people do have symptoms like these:  Bleeding from the anus. You might notice blood on your underwear or on toilet paper after you have had a bowel movement.   Constipation or diarrhea that lasts more than a week.   Blood in the stool. Blood can make stool look black or it can show up as red streaks in the stool.  If you have any of these symptoms, see your caregiver. HOW DOES THE DOCTOR TEST FOR POLYPS? The doctor can use four tests to check for polyps:  Digital rectal exam. The caregiver wears  gloves and checks your rectum (the last part of the large intestine) to see if it feels normal. This test would find polyps only in the rectum. Your caregiver may need to do one of the other tests listed below to find polyps higher up in the intestine.   Barium enema. The caregiver puts a liquid called barium into your rectum before taking x-rays of your large intestine. Barium makes your intestine look [place] in the pictures. Polyps are dark, so they are easy to see.   Sigmoidoscopy. With this test, the caregiver can see inside your large intestine. A thin flexible tube is placed into your rectum. The device is called a sigmoidoscope, which has a light and a tiny video camera in it. The caregiver uses the sigmoidoscope to look at the last  third of your large intestine.   Colonoscopy. This test is like sigmoidoscopy, but the caregiver looks at all of the large intestine. It usually requires sedation. This is the most common method for finding and removing polyps.  TREATMENT  The caregiver will remove the polyp during sigmoidoscopy or colonoscopy. The polyp is then tested for cancer.   If you have had polyps, your caregiver may want you to get tested regularly in the future.  PREVENTION There is not one sure way to prevent polyps. You might be able to lower your risk of getting them if you:  Eat more fruits and vegetables and less fatty food.   Do not smoke.   Avoid alcohol.   Exercise every day.   Lose weight if you are overweight.   Eating more calcium and folate can also lower your risk of getting polyps. Some foods that are rich in calcium are milk, cheese, and broccoli. Some foods that are rich in folate are chickpeas, kidney beans, and spinach.   Aspirin might help prevent polyps. Studies are under way.  Document Released: 10/09/2003 Document Re-Released: 07/02/2009 [facility] Patient Information 2011 [place], [place].

## 2010-10-31 ENCOUNTER — Emergency Department (HOSPITAL_COMMUNITY)
Admission: EM | Admit: 2010-10-31 | Discharge: 2010-11-01 | Discharge status: Against Medical Advice | Payer: Medicaid Other | Attending: Emergency Medicine | Admitting: Emergency Medicine

## 2010-10-31 DIAGNOSIS — R109 Unspecified abdominal pain: Secondary | ICD-10-CM | POA: Insufficient documentation

## 2010-11-01 ENCOUNTER — Inpatient Hospital Stay (HOSPITAL_COMMUNITY)
Admission: EM | Admit: 2010-11-01 | Discharge: 2010-11-11 | DRG: 392 | Disposition: A | Discharge status: Home / Self Care | Payer: Medicaid Other | Attending: Internal Medicine | Admitting: Internal Medicine

## 2010-11-01 ENCOUNTER — Emergency Department (HOSPITAL_COMMUNITY): Payer: Medicaid Other

## 2010-11-01 DIAGNOSIS — K219 Gastro-esophageal reflux disease without esophagitis: Secondary | ICD-10-CM | POA: Diagnosis present

## 2010-11-01 DIAGNOSIS — Z860100 Personal history of colon polyps, unspecified: Secondary | ICD-10-CM

## 2010-11-01 DIAGNOSIS — A5901 Trichomonal vulvovaginitis: Secondary | ICD-10-CM | POA: Diagnosis present

## 2010-11-01 DIAGNOSIS — K449 Diaphragmatic hernia without obstruction or gangrene: Secondary | ICD-10-CM | POA: Diagnosis present

## 2010-11-01 DIAGNOSIS — I10 Essential (primary) hypertension: Secondary | ICD-10-CM | POA: Diagnosis present

## 2010-11-01 DIAGNOSIS — K439 Ventral hernia without obstruction or gangrene: Secondary | ICD-10-CM | POA: Diagnosis present

## 2010-11-01 DIAGNOSIS — E871 Hypo-osmolality and hyponatremia: Secondary | ICD-10-CM | POA: Diagnosis present

## 2010-11-01 DIAGNOSIS — R112 Nausea with vomiting, unspecified: Secondary | ICD-10-CM | POA: Diagnosis present

## 2010-11-01 DIAGNOSIS — K5900 Constipation, unspecified: Secondary | ICD-10-CM | POA: Diagnosis present

## 2010-11-01 DIAGNOSIS — B370 Candidal stomatitis: Secondary | ICD-10-CM | POA: Diagnosis not present

## 2010-11-01 DIAGNOSIS — K5732 Diverticulitis of large intestine without perforation or abscess without bleeding: Principal | ICD-10-CM | POA: Diagnosis present

## 2010-11-01 DIAGNOSIS — K900 Celiac disease: Secondary | ICD-10-CM | POA: Diagnosis present

## 2010-11-01 DIAGNOSIS — E669 Obesity, unspecified: Secondary | ICD-10-CM | POA: Diagnosis present

## 2010-11-01 LAB — WET PREP, GENITAL

## 2010-11-01 LAB — URINALYSIS, ROUTINE W REFLEX MICROSCOPIC
Bilirubin Urine: NEGATIVE
Hgb urine dipstick: NEGATIVE
Specific Gravity, Urine: 1.012 (ref 1.005–1.030)
Urobilinogen, UA: 1 mg/dL (ref 0.0–1.0)

## 2010-11-01 LAB — DIFFERENTIAL
Basophils Relative: 0 % (ref 0–1)
Eosinophils Relative: 1 % (ref 0–5)
Monocytes Absolute: 0.9 10*3/uL (ref 0.1–1.0)
Monocytes Relative: 9 % (ref 3–12)
Neutro Abs: 7.8 10*3/uL — ABNORMAL HIGH (ref 1.7–7.7)

## 2010-11-01 LAB — CBC
MCV: 84.8 fL (ref 78.0–100.0)
Platelets: 286 10*3/uL (ref 150–400)
RDW: 13 % (ref 11.5–15.5)
WBC: 10.4 10*3/uL (ref 4.0–10.5)

## 2010-11-01 LAB — COMPREHENSIVE METABOLIC PANEL
AST: 11 U/L (ref 0–37)
Albumin: 3.5 g/dL (ref 3.5–5.2)
Chloride: 97 mEq/L (ref 96–112)
Creatinine, Ser: 0.59 mg/dL (ref 0.50–1.10)
Sodium: 133 mEq/L — ABNORMAL LOW (ref 135–145)
Total Bilirubin: 0.6 mg/dL (ref 0.3–1.2)

## 2010-11-01 LAB — POCT PREGNANCY, URINE: Preg Test, Ur: NEGATIVE

## 2010-11-02 LAB — BASIC METABOLIC PANEL
CO2: 24 mEq/L (ref 19–32)
Chloride: 98 mEq/L (ref 96–112)
Glucose, Bld: 103 mg/dL — ABNORMAL HIGH (ref 70–99)
Potassium: 3.8 mEq/L (ref 3.5–5.1)
Sodium: 131 mEq/L — ABNORMAL LOW (ref 135–145)

## 2010-11-02 LAB — DIFFERENTIAL
Basophils Absolute: 0 10*3/uL (ref 0.0–0.1)
Basophils Relative: 0 % (ref 0–1)
Neutro Abs: 6.7 10*3/uL (ref 1.7–7.7)
Neutrophils Relative %: 76 % (ref 43–77)

## 2010-11-02 LAB — CBC
Hemoglobin: 11.3 g/dL — ABNORMAL LOW (ref 12.0–15.0)
RBC: 3.98 MIL/uL (ref 3.87–5.11)

## 2010-11-02 MED ORDER — IOHEXOL 300 MG/ML  SOLN
100.0000 mL | Freq: Once | INTRAMUSCULAR | Status: AC | PRN
  Administered 2010-11-02: 100 mL via INTRAVENOUS

## 2010-11-03 LAB — GC/CHLAMYDIA PROBE AMP, GENITAL: Chlamydia, DNA Probe: NEGATIVE

## 2010-11-03 LAB — CBC
Hemoglobin: 10.2 g/dL — ABNORMAL LOW (ref 12.0–15.0)
MCH: 28.1 pg (ref 26.0–34.0)
MCV: 85.7 fL (ref 78.0–100.0)
RBC: 3.63 MIL/uL — ABNORMAL LOW (ref 3.87–5.11)

## 2010-11-03 LAB — POCT PREGNANCY, URINE: Preg Test, Ur: NEGATIVE

## 2010-11-03 NOTE — H&P (Signed)
NAME:  Briana Wade, Briana Wade NO.:  [id number]  MEDICAL RECORD NO.:  [id number]  LOCATION:  WLED                         FACILITY:  [facility]  PHYSICIAN:  Della Goo, M.D. DATE OF BIRTH:  Nov 09, 1970  DATE OF ADMISSION:  11/01/2010 DATE OF DISCHARGE:                             HISTORY & PHYSICAL   PRIMARY CARE PHYSICIAN:  Unassigned.  CHIEF COMPLAINT:  Left lower quadrant abdominal pain.  HISTORY OF PRESENT ILLNESS:  This is a 40 year old female who presents to the emergency department with complaints of severe left lower quadrant abdominal pain for the past 3 days.  The pain was described as crampy and intermittent and she rates the pain as being 10/10 at the worst.  The patient has had nausea, but no vomiting and no diarrhea.   She does report having some constipation.  She also denies having fevers or chills.  The patient denies having any similar illness to this previously.  The patient was seen and evaluated in the emergency department and a CT scan of abdomen and pelvis was performed results of which did reveal large bowel, diverticulosis, focal segment, thickening of the colon wall involving the descending colon and the sigmoid colon junction.  The patient was referred for medical admission to the hospitalist service. She was placed on IV ciprofloxacin and Flagyl therapy.  PAST MEDICAL HISTORY:  Significant for hypertension and obesity.  PAST SURGICAL HISTORY:  History of an appendectomy and bilateral tubal ligation.  MEDICATIONS:  Will need to be further verified.  ALLERGIES:  No known drug allergies.  SOCIAL HISTORY:  The patient is home with her children.  She is a nonsmoker, nondrinker.  No history of illicit drug usage.  FAMILY HISTORY:  Noncontributory.  REVIEW OF SYSTEMS:  Pertinent as mentioned above.  PHYSICAL EXAMINATION FINDINGS:  GENERAL:  This is an obese 40 year old African American female who is in discomfort, but no acute  distress. VITAL SIGNS:  Temperature 99.3, blood pressure 152/123, heart rate 101, respirations 18, O2 sats 100%. HEENT:  Normocephalic, atraumatic.  Pupils are equally round and reactive to light.  Extraocular movements are intact.  Funduscopic benign.  There is no scleral icterus.  Nares are patent bilaterally. Oropharynx is clear. NECK:  Supple full range of motion.  No thyromegaly, adenopathy, jugular venous distention. CARDIOVASCULAR:  Regular rate and rhythm.  No murmurs, gallops, or rubs appreciated. LUNGS:  Clear to auscultation bilaterally.  No rales, rhonchi, or wheezes.  Chest wall is nontender.  Chest wall excursion is symmetric and breathing is unlabored. ABDOMEN:  Positive bowel sounds, obese, soft, tender in the left lower quadrant area.  There is no rebound or guarding. EXTREMITIES:  Without cyanosis, clubbing, or edema. NEUROLOGIC:  Nonfocal.  LABORATORY STUDIES:  [place] blood cell count 10.4, hemoglobin 11.9, hematocrit 35.6, platelets 286, neutrophils 75%, lymphocytes 15%. Sodium 133, potassium 3.5, chloride 97, carbon dioxide 24, BUN 7, creatinine 0.59, and glucose 95.  Urinalysis negative except urine microscopic reveals many Trichomonas, many clue cells, and many [place] blood cells.  The urinalysis is negative.  CT scan of the abdomen and pelvis as mentioned above.  Urine hCG negative.  ASSESSMENT:  A 40 year old female being admitted with: 1.  Left lower quadrant abdominal pain. 2. Colitis. 3. Most likely diverticulitis as well. 4. Hypertension. 5. Obesity. 6. Trichomonas vaginitis.  PLAN:  The patient will be admitted to a MedSurg area.  She will be placed on IV antibiotic therapy of ciprofloxacin and Flagyl.  IV fluids have been ordered for maintenance therapy.  The patient will be placed on clear liquids.  Pain control therapy has also been ordered.  The results of the urinalysis reveals trichomonas vaginitis.  For the trichomonas vaginitis, the patient  is covered with the IV Flagyl for now.  However, this may be changed to the single dose treatment, once the patient has been weaned off the IV Flagyl therapy.  The patient has been counseled in regard to her partner being also treated for this since it is a sexually transmitted disease.  DVT prophylaxis has been ordered and further workup will ensue pending results of the patient's clinical course.     Della Goo, M.D.     HJ/MEDQ  D:  11/02/2010  T:  11/02/2010  Job:  [id number]  Electronically Signed by Della Goo M.D. on 11/03/2010 02:22:01 AM

## 2010-11-04 ENCOUNTER — Inpatient Hospital Stay (HOSPITAL_COMMUNITY): Payer: Medicaid Other

## 2010-11-04 LAB — BASIC METABOLIC PANEL
CO2: 26 mEq/L (ref 19–32)
Calcium: 8.5 mg/dL (ref 8.4–10.5)
Chloride: 103 mEq/L (ref 96–112)
Sodium: 135 mEq/L (ref 135–145)

## 2010-11-06 LAB — DIFFERENTIAL
Basophils Relative: 0 % (ref 0–1)
Monocytes Relative: 10 % (ref 3–12)
Neutro Abs: 2.4 10*3/uL (ref 1.7–7.7)
Neutrophils Relative %: 58 % (ref 43–77)

## 2010-11-06 LAB — BASIC METABOLIC PANEL
CO2: 25 mEq/L (ref 19–32)
Calcium: 8.5 mg/dL (ref 8.4–10.5)
Chloride: 101 mEq/L (ref 96–112)
Glucose, Bld: 93 mg/dL (ref 70–99)
Potassium: 3.5 mEq/L (ref 3.5–5.1)
Sodium: 133 mEq/L — ABNORMAL LOW (ref 135–145)

## 2010-11-06 LAB — CBC
Hemoglobin: 10.5 g/dL — ABNORMAL LOW (ref 12.0–15.0)
MCH: 27.9 pg (ref 26.0–34.0)
RBC: 3.76 MIL/uL — ABNORMAL LOW (ref 3.87–5.11)
WBC: 4.1 10*3/uL (ref 4.0–10.5)

## 2010-11-07 ENCOUNTER — Inpatient Hospital Stay (HOSPITAL_COMMUNITY): Payer: Medicaid Other

## 2010-11-07 LAB — DIFFERENTIAL
Lymphocytes Relative: 28 % (ref 12–46)
Lymphs Abs: 1.6 10*3/uL (ref 0.7–4.0)
Monocytes Relative: 9 % (ref 3–12)
Neutro Abs: 3.3 10*3/uL (ref 1.7–7.7)
Neutrophils Relative %: 61 % (ref 43–77)

## 2010-11-07 LAB — CBC
Hemoglobin: 10.8 g/dL — ABNORMAL LOW (ref 12.0–15.0)
MCH: 27.9 pg (ref 26.0–34.0)
MCV: 84.8 fL (ref 78.0–100.0)
RBC: 3.87 MIL/uL (ref 3.87–5.11)
WBC: 5.5 10*3/uL (ref 4.0–10.5)

## 2010-11-07 LAB — BASIC METABOLIC PANEL
CO2: 23 mEq/L (ref 19–32)
Calcium: 8.8 mg/dL (ref 8.4–10.5)
Chloride: 101 mEq/L (ref 96–112)
Creatinine, Ser: 0.64 mg/dL (ref 0.50–1.10)
Glucose, Bld: 85 mg/dL (ref 70–99)

## 2010-11-08 ENCOUNTER — Inpatient Hospital Stay (HOSPITAL_COMMUNITY): Payer: Medicaid Other

## 2010-11-08 DIAGNOSIS — K5732 Diverticulitis of large intestine without perforation or abscess without bleeding: Secondary | ICD-10-CM

## 2010-11-08 DIAGNOSIS — R110 Nausea: Secondary | ICD-10-CM

## 2010-11-08 DIAGNOSIS — R1032 Left lower quadrant pain: Secondary | ICD-10-CM

## 2010-11-08 LAB — CBC
Hemoglobin: 11.2 g/dL — ABNORMAL LOW (ref 12.0–15.0)
MCH: 28 pg (ref 26.0–34.0)
MCHC: 33.3 g/dL (ref 30.0–36.0)
MCV: 84 fL (ref 78.0–100.0)
RBC: 4 MIL/uL (ref 3.87–5.11)

## 2010-11-08 LAB — DIFFERENTIAL
Basophils Relative: 0 % (ref 0–1)
Eosinophils Absolute: 0.1 10*3/uL (ref 0.0–0.7)
Lymphs Abs: 1.4 10*3/uL (ref 0.7–4.0)
Monocytes Absolute: 0.6 10*3/uL (ref 0.1–1.0)
Monocytes Relative: 11 % (ref 3–12)
Neutro Abs: 3.6 10*3/uL (ref 1.7–7.7)

## 2010-11-08 LAB — BASIC METABOLIC PANEL
CO2: 23 mEq/L (ref 19–32)
Calcium: 8.7 mg/dL (ref 8.4–10.5)
Creatinine, Ser: 0.63 mg/dL (ref 0.50–1.10)
GFR calc non Af Amer: >90 mL/min (ref >90)
Glucose, Bld: 83 mg/dL (ref 70–99)
Sodium: 131 mEq/L — ABNORMAL LOW (ref 135–145)

## 2010-11-08 MED ORDER — IOHEXOL 300 MG/ML  SOLN
100.0000 mL | Freq: Once | INTRAMUSCULAR | Status: AC | PRN
  Administered 2010-11-08: 100 mL via INTRAVENOUS

## 2010-11-09 LAB — DIFFERENTIAL
Basophils Absolute: 0 10*3/uL (ref 0.0–0.1)
Basophils Relative: 0 % (ref 0–1)
Eosinophils Absolute: 0 10*3/uL (ref 0.0–0.7)
Eosinophils Relative: 1 % (ref 0–5)
Monocytes Absolute: 0.7 10*3/uL (ref 0.1–1.0)
Monocytes Relative: 13 % — ABNORMAL HIGH (ref 3–12)
Neutro Abs: 3.5 10*3/uL (ref 1.7–7.7)

## 2010-11-09 LAB — CBC
Hemoglobin: 11.3 g/dL — ABNORMAL LOW (ref 12.0–15.0)
MCH: 28 pg (ref 26.0–34.0)
MCHC: 33.4 g/dL (ref 30.0–36.0)

## 2010-11-09 LAB — BASIC METABOLIC PANEL
BUN: 4 mg/dL — ABNORMAL LOW (ref 6–23)
Calcium: 9.2 mg/dL (ref 8.4–10.5)
GFR calc non Af Amer: >90 mL/min (ref >90)
Glucose, Bld: 83 mg/dL (ref 70–99)
Sodium: 134 mEq/L — ABNORMAL LOW (ref 135–145)

## 2010-11-10 DIAGNOSIS — K5732 Diverticulitis of large intestine without perforation or abscess without bleeding: Secondary | ICD-10-CM

## 2010-11-10 DIAGNOSIS — R110 Nausea: Secondary | ICD-10-CM

## 2010-11-10 DIAGNOSIS — R1032 Left lower quadrant pain: Secondary | ICD-10-CM

## 2010-11-10 LAB — BASIC METABOLIC PANEL
BUN: 3 mg/dL — ABNORMAL LOW (ref 6–23)
Creatinine, Ser: 0.67 mg/dL (ref 0.50–1.10)
GFR calc Af Amer: >90 mL/min (ref >90)
GFR calc non Af Amer: >90 mL/min (ref >90)
Potassium: 4 mEq/L (ref 3.5–5.1)

## 2010-11-11 DIAGNOSIS — R110 Nausea: Secondary | ICD-10-CM

## 2010-11-11 DIAGNOSIS — R1032 Left lower quadrant pain: Secondary | ICD-10-CM

## 2010-11-11 DIAGNOSIS — K5732 Diverticulitis of large intestine without perforation or abscess without bleeding: Secondary | ICD-10-CM

## 2010-11-11 LAB — BASIC METABOLIC PANEL
Calcium: 9.4 mg/dL (ref 8.4–10.5)
Creatinine, Ser: 0.59 mg/dL (ref 0.50–1.10)
GFR calc Af Amer: >90 mL/min (ref >90)

## 2010-11-12 LAB — POCT PREGNANCY, URINE: Preg Test, Ur: NEGATIVE

## 2010-11-22 NOTE — Discharge Summary (Signed)
NAMEBIONCA, [name]:  [id number]  MEDICAL RECORD NO.:  [id number]  LOCATION:  1535                         FACILITY:  [facility]  PHYSICIAN:  Ramiro Harvest, MD    DATE OF BIRTH:  1970-07-05  DATE OF ADMISSION:  11/01/2010 DATE OF DISCHARGE:  11/11/2010                        DISCHARGE SUMMARY    PRIMARY CARE PHYSICIAN:  Unknown.  The patient is going to call the [facility] to find a PCP.  Gastroenterologist; [facility], Dr. Rhea Belton.  DISCHARGE DIAGNOSES: 1. Acute refractory diverticulitis, improved. 2. Oral thrush. 3. Hypertension. 4. Hypokalemia. 5. Trichomonas. 6. Gluten intolerance.  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 7. History of colonic polyps. 8. Gastroesophageal reflux disease. 9. Hiatal hernia per prior colonoscopy.  DISCHARGE MEDICATIONS: 1. Augmentin 875 mg p.o. b.i.d. x5 days. 2. Diflucan 100 mg p.o. daily x6 days. 3. Norvasc 5 mg p.o. daily. 4. Nystatin 5 cc swish and spit 4 times daily x6 days. 5. Ibuprofen 600 mg p.o. t.i.d. p.r.n. 6. Oxycodone/acetaminophen 5/325 mg 1-2 tablets p.o. q.4 hours p.r.n. 7. Sleep aid called doxylamine succinate 25 mg 1-2 tablets p.o. at     bedtime p.r.n. 8. Albuterol inhaler 2 puffs q.6 hours p.r.n.  DISPOSITION/FOLLOWUP:  The patient will be discharged home.  The patient is to follow up with Dr. Rhea Belton, [facility] in 2 weeks, to follow up on acute refractory diverticulitis as well as her gluten intolerance.  The patient will need her BMET checked to follow up on her electrolytes. The patient will be discharged home on 5 more days of antibiotic therapy to complete a 2-week course.  The patient is going to call the [facility] number to pick a PCP.  She will need to follow up with PCP in 1- 2 weeks.  On followup PCP, will need to check BMET, to follow up on electrolytes and renal function and also to manage her blood pressure.  CONSULTATIONS DONE:  GI  consultation was done.  The patient was seen in consultation by Dr. Rhea Belton on November 08, 2010.  PROCEDURES PERFORMED:  CT of the abdomen and pelvis was done on November 02, 2010, that showed findings in keeping with acute diverticulitis centered at the descending colon/sigmoid colon junction, surrounding inflammatory changes and small amount of free fluid.  No loculated fluid collection to suggest abscess.  Consider followup colonoscopy to exclude an underlying lesion following treatment.  Acute abdominal series done November 04, 2010, shows no acute abnormality in the chest or abdomen. Acute abdominal series on November 07, 2010,  shows borderline enlargement of cardiac silhouette.  No acute abnormalities.  CT of the abdomen and pelvis done on November 08, 2010, shows improving changes of diverticulitis of the proximal sigmoid colon since prior study.  No developing abscess.  Small amount of free fluid in the pelvis is likely reactive, small esophageal hiatal hernia, small ventral umbilical hernia.  BRIEF ADMISSION HISTORY AND PHYSICAL:  Ms. Merrillyn Wade is a 40 year old African American female who presented to the ED with complaints of severe left lower quadrant abdominal pain x3 days.  The patient's pain was described as crampy and intermittent and rated the pain  as being at 10/10 at the worse.  The patient had some nausea, but no vomiting.  No diarrhea.  Did report having some constipation.  The patient also denied having fevers or chills.  The patient denied having any similar symptoms to that previously.  The patient was seen and evaluated in the ED and CT scan of the abdomen and pelvis was performed with results which did reveal large bowel diverticulosis, thickening of the colonic wall involving the descending colon and sigmoid colon junction.  The patient was referred for medical admission to the hospitalist service. The patient was placed on IV Cipro and Flagyl therapy.  For  rest of admission history and physical, please see H and P dictated by Dr. Lovell Sheehan of job [phone].  HOSPITAL COURSE: 1. Acute refractory diverticulitis.  The patient was admitted with     diverticulitis/colitis.  She was placed on IV fluids, placed on     clear liquids and placed on IV ciprofloxacin and Flagyl.  The     patient was monitored and followed and also pain management was     placed.  Due to urinalysis that revealed a Trichomonas vaginitis,     the patient was on Flagyl, which would have covered for that.  The     patient was monitored; however, the patient did not improve     significantly during the hospitalization.  CT of the abdomen and     pelvis were obtained on admission.  The patient continued to have     some nausea with some emesis.  The patient continued to have     abdominal pain when she was initially transitioned to oral     medications.  She was subsequently placed back on IV Cipro and     Flagyl and monitored and continued supportive care.  The patient     did not improve significantly in the first few early days of the     hospitalization.  She continued to have abdominal pain.  She     continued to have nausea.  Her diet was tried to advance to full     liquids; however, the patient was unable to tolerate this and she     was placed back on n.p.o.  Due to her refractory symptoms, a GI     consultation was obtained.  The patient was seen in consultation by     Dr. Rhea Belton on November 08, 2010, at which point in time, it was     recommended to repeat a CT of the abdomen and pelvis to rule out     any abscess.  Her Flagyl was discontinued and was felt IV     antibiotics may need to be brought in.  The patient was placed on a     gluten-free diet and she was monitored.     Once the Flagyl was discontinued and she was started on the gluten-     free diet, she improved slowly but clinically.  IV Cipro was     subsequently discontinued.  She was placed on IV Zosyn.   The     patient was monitored.  The patient's nausea resolved.  Emesis also     resolved.  Her diet was advanced and she was subsequently placed on     a gluten-free diet, which the patient tolerated.  The patient did     not have any further nausea or vomiting.  Her abdominal pain     resolved and  as of day of discharge, on November 11, 2010, the     patient had improved clinically and significantly.  Repeat CT of     the abdomen and pelvis which was done was negative for abscess.     The patient improved clinically and she will be discharged home on     oral Augmentin for 5 more days to complete a 2-week course of     antibiotic therapy.  The patient will follow up with Dr. Rhea Belton of     [facility] as an outpatient.  The patient will be discharged in a     stable and improved condition. 2. Oral thrush.  During the hospitalization, the patient was noted to     have oral thrush.  She was placed on some nystatin swish and spit.     Diflucan was also added to her regimen.  The patient will be     discharged home on 6 days of oral Diflucan to complete a 1-week     course of therapy. 3. Hypertension.  During the hospitalization, the patient's blood     pressure was maintained on lisinopril.  The patient's blood     pressure remained stable.  However the patient noted to [place] on the     day of discharge that she was discontinued from her lisinopril was     secondary to ACE inhibitor-induced cough.  Subsequently she states     that she was on Norvasc at home; however, this was never ever     recorded on the medical reconciliation form.  The patient was     discharged home on 5 mg of Norvasc and will follow up with her PCP     for further blood pressure management. 4. Trichomonas.  On admission, a urinalysis was positive for     Trichomonas.  The patient during the hospitalization received 6-7     days of IV Flagyl and Trichomonas has subsequently been fully     treated. 5. Hypokalemia.  During  the hospitalization, the patient was noted to     be hypokalemic which was felt to be likely secondary to GI losses.     The patient's potassium was repleted.  The patient will need to     follow up with PCP as an outpatient.  She will need a repeat BMET     done in approximately a week.  The patient will be discharged in a     stable and improved condition. The rest of the patient's chronic medical issues have remained stable throughout the hospitalization and the patient will be discharged in stable and improved condition.  On day of discharge, vital signs; temperature 98.2, pulse of 68, blood pressure 120/81, respirations 16, satting 97% on room air.  DISCHARGE LABS:  Sodium 136, potassium 3.3, chloride 101, bicarb 25, BUN less than 3, glucose 108, creatinine of 0.59, and a calcium of 9.4.  The patient's potassium was repleted prior to discharge.  It has been a pleasure taking care of Briana Wade.     Ramiro Harvest, MD     DT/MEDQ  D:  11/11/2010  T:  11/11/2010  Job:  [id number]  cc:   Erick Blinks, MD Fax: [phone]  Hedwig Morton. Juanda Chance, MD [address] [place] [address]  Electronically Signed by Ramiro Harvest MD on 11/22/2010 05:21:40 PM

## 2011-04-27 ENCOUNTER — Encounter (HOSPITAL_COMMUNITY): Payer: Self-pay | Admitting: *Deleted

## 2011-04-27 ENCOUNTER — Emergency Department (HOSPITAL_COMMUNITY)
Admission: EM | Admit: 2011-04-27 | Discharge: 2011-04-27 | Disposition: A | Discharge status: Home / Self Care | Payer: Medicaid Other | Attending: Emergency Medicine | Admitting: Emergency Medicine

## 2011-04-27 DIAGNOSIS — K589 Irritable bowel syndrome without diarrhea: Secondary | ICD-10-CM | POA: Insufficient documentation

## 2011-04-27 DIAGNOSIS — I10 Essential (primary) hypertension: Secondary | ICD-10-CM | POA: Insufficient documentation

## 2011-04-27 DIAGNOSIS — H9210 Otorrhea, unspecified ear: Secondary | ICD-10-CM | POA: Insufficient documentation

## 2011-04-27 DIAGNOSIS — H9209 Otalgia, unspecified ear: Secondary | ICD-10-CM | POA: Insufficient documentation

## 2011-04-27 DIAGNOSIS — R51 Headache: Secondary | ICD-10-CM | POA: Insufficient documentation

## 2011-04-27 DIAGNOSIS — H6590 Unspecified nonsuppurative otitis media, unspecified ear: Secondary | ICD-10-CM

## 2011-04-27 MED ORDER — AMOXICILLIN-POT CLAVULANATE 875-125 MG PO TABS: 1.0000 | ORAL_TABLET | Freq: Two times a day (BID) | ORAL | Status: AC

## 2011-04-27 MED ORDER — AMOXICILLIN-POT CLAVULANATE 875-125 MG PO TABS
1.0000 | ORAL_TABLET | Freq: Once | ORAL | Status: AC
  Administered 2011-04-27: 1 via ORAL
  Filled 2011-04-27: qty 1

## 2011-04-27 MED ORDER — KETOROLAC TROMETHAMINE 30 MG/ML IJ SOLN
30.0000 mg | Freq: Once | INTRAMUSCULAR | Status: AC
  Administered 2011-04-27: 30 mg via INTRAMUSCULAR
  Filled 2011-04-27: qty 1

## 2011-04-27 MED ORDER — HYDROCODONE-ACETAMINOPHEN 5-500 MG PO TABS: 1.0000 | ORAL_TABLET | Freq: Four times a day (QID) | ORAL | Status: AC | PRN

## 2011-04-27 NOTE — ED Notes (Signed)
Pt. States she has 10/10 ear pain today. She has noticed a bad odor and  decrease hearing in rt ear. She is dabbing a small amount of clear liquid from her rt ear with tissue.

## 2011-04-27 NOTE — ED Provider Notes (Signed)
History     CSN: [id number]  Arrival date & time 04/27/11  0416   First MD Initiated Contact with Patient 04/27/11 [phone]      No chief complaint on file.   (Consider location/radiation/quality/duration/timing/severity/associated sxs/prior treatment) HPI Comments: She developed a right earache 36 hours ago with slight rhinitis no sore throat.  Approximately 8 hours ago.  She noticed clear drainage from her right ear that has an odor at that time.  She also developed a headache.  Denies fever  The history is provided by the patient.    Past Medical History  Diagnosis Date  . Other constipation   . Abdominal pain, unspecified site   . Hypertension   . Migraine   . IBS (irritable bowel syndrome)   . Umbilical hernia   . Uterine fibroid   . Chlamydia   . Hiatal hernia   . Esophageal reflux   . Personal history of colonic polyps 09/12/2010    tubular adenoma  . Lactose intolerance     Past Surgical History  Procedure Date  . Appendectomy   . Tubal ligation   . Appendectomy     Family History  Problem Relation Age of Onset  . Colon cancer Neg Hx   . Hypertension Mother   . Cancer Mother     not sure of type    History  Substance Use Topics  . Smoking status: Never Smoker   . Smokeless tobacco: Never Used  . Alcohol Use: Yes     occasionally    OB History    Grav Para Term Preterm Abortions TAB SAB Ect Mult Living                  Review of Systems  Constitutional: Negative for fever.  HENT: Positive for ear pain, congestion and ear discharge. Negative for hearing loss, sore throat, trouble swallowing and neck stiffness.   Respiratory: Negative for cough.   Cardiovascular: Negative for chest pain.  Neurological: Positive for headaches. Negative for dizziness and weakness.    Allergies  Review of patient's allergies indicates no known allergies.  Home Medications   Current Outpatient Rx  Name Route Sig Dispense Refill  . AMLODIPINE BESYLATE 10 MG PO  TABS Oral Take 10 mg by mouth daily.      Marland Kitchen ESOMEPRAZOLE MAGNESIUM 40 MG PO CPDR Oral Take 1 capsule (40 mg total) by mouth daily. 30 capsule 6  . IBUPROFEN 600 MG PO TABS Oral Take 600 mg by mouth every 6 (six) hours as needed.      . AMOXICILLIN-POT CLAVULANATE 875-125 MG PO TABS Oral Take 1 tablet by mouth 2 (two) times daily. 20 tablet 0  . HYDROCODONE-ACETAMINOPHEN 5-500 MG PO TABS Oral Take 1-2 tablets by mouth every 6 (six) hours as needed for pain. 15 tablet 0    BP 142/94  Pulse 81  Temp(Src) 98 F (36.7 C) (Oral)  Resp 16  Ht 5\' 1"  (1.549 m)  Wt 210 lb (95.255 kg)  BMI 39.68 kg/m2  SpO2 100%  LMP 04/25/2011  Physical Exam  Constitutional: She is oriented to person, place, and time. She appears well-developed and well-nourished.  HENT:  Right Ear: Hearing normal. There is drainage and tenderness. No mastoid tenderness. Tympanic membrane is perforated. Tympanic membrane is not injected. A middle ear effusion is present.  Left Ear: Hearing, tympanic membrane and ear canal normal.  Eyes: Pupils are equal, round, and reactive to light.  Neck: Normal range of motion.  Cardiovascular:  Normal rate.   Pulmonary/Chest: Effort normal.  Abdominal: Soft.  Musculoskeletal: Normal range of motion.  Neurological: She is alert and oriented to person, place, and time.  Skin: Skin is warm.    ED Course  Procedures (including critical care time)  Labs Reviewed - No data to display No results found.   1. Serous otitis media with rupture of tympanic membrane       MDM  Serous otitis        Arman Filter, NP 04/27/11 0456  Arman Filter, NP 04/27/11 [phone]

## 2011-04-27 NOTE — ED Notes (Addendum)
Pt sts she has been having intense right ear pain and a headache since early yesterday morning. Sts she has taken 1 ibuprofen yesterday morning with no relief. Patient sts her headache is 10/10, ear pain 10/10. Pt sts there is a clear colorless liquid draining out of pt ear. Also sts that it has an odor.

## 2011-04-27 NOTE — ED Provider Notes (Signed)
Medical screening examination/treatment/procedure(s) were performed by non-physician practitioner and as supervising physician I was immediately available for consultation/collaboration.   Dione Booze, MD 04/27/11 [phone]

## 2011-06-01 ENCOUNTER — Encounter (HOSPITAL_COMMUNITY): Payer: Self-pay | Admitting: Emergency Medicine

## 2011-06-01 ENCOUNTER — Emergency Department (HOSPITAL_COMMUNITY): Payer: Medicaid Other

## 2011-06-01 ENCOUNTER — Emergency Department (HOSPITAL_COMMUNITY)
Admission: EM | Admit: 2011-06-01 | Discharge: 2011-06-02 | Disposition: A | Discharge status: Home / Self Care | Payer: Medicaid Other | Attending: Emergency Medicine | Admitting: Emergency Medicine

## 2011-06-01 DIAGNOSIS — I10 Essential (primary) hypertension: Secondary | ICD-10-CM | POA: Insufficient documentation

## 2011-06-01 DIAGNOSIS — K5732 Diverticulitis of large intestine without perforation or abscess without bleeding: Secondary | ICD-10-CM | POA: Insufficient documentation

## 2011-06-01 DIAGNOSIS — R110 Nausea: Secondary | ICD-10-CM | POA: Insufficient documentation

## 2011-06-01 DIAGNOSIS — Z79899 Other long term (current) drug therapy: Secondary | ICD-10-CM | POA: Insufficient documentation

## 2011-06-01 DIAGNOSIS — K5792 Diverticulitis of intestine, part unspecified, without perforation or abscess without bleeding: Secondary | ICD-10-CM

## 2011-06-01 DIAGNOSIS — K219 Gastro-esophageal reflux disease without esophagitis: Secondary | ICD-10-CM | POA: Insufficient documentation

## 2011-06-01 DIAGNOSIS — R109 Unspecified abdominal pain: Secondary | ICD-10-CM | POA: Insufficient documentation

## 2011-06-01 DIAGNOSIS — K589 Irritable bowel syndrome without diarrhea: Secondary | ICD-10-CM | POA: Insufficient documentation

## 2011-06-01 LAB — URINALYSIS, ROUTINE W REFLEX MICROSCOPIC
Bilirubin Urine: NEGATIVE
Hgb urine dipstick: NEGATIVE
Protein, ur: NEGATIVE mg/dL
Urobilinogen, UA: 1 mg/dL (ref 0.0–1.0)

## 2011-06-01 LAB — BASIC METABOLIC PANEL
CO2: 23 mEq/L (ref 19–32)
Calcium: 8.7 mg/dL (ref 8.4–10.5)
Creatinine, Ser: 0.68 mg/dL (ref 0.50–1.10)
Glucose, Bld: 105 mg/dL — ABNORMAL HIGH (ref 70–99)

## 2011-06-01 MED ORDER — HYDROMORPHONE HCL PF 1 MG/ML IJ SOLN
1.0000 mg | Freq: Once | INTRAMUSCULAR | Status: AC
  Administered 2011-06-01: 1 mg via INTRAVENOUS
  Filled 2011-06-01: qty 1

## 2011-06-01 MED ORDER — METRONIDAZOLE 500 MG PO TABS: 500.0000 mg | ORAL_TABLET | Freq: Two times a day (BID) | ORAL | Status: DC

## 2011-06-01 MED ORDER — METRONIDAZOLE 500 MG PO TABS
500.0000 mg | ORAL_TABLET | Freq: Once | ORAL | Status: AC
  Administered 2011-06-01: 500 mg via ORAL
  Filled 2011-06-01: qty 1

## 2011-06-01 MED ORDER — CIPROFLOXACIN HCL 500 MG PO TABS
500.0000 mg | ORAL_TABLET | Freq: Once | ORAL | Status: AC
Start: 2011-06-01 — End: 2011-06-01
  Administered 2011-06-01: 500 mg via ORAL
  Filled 2011-06-01: qty 1

## 2011-06-01 MED ORDER — ONDANSETRON HCL 4 MG/2ML IJ SOLN
4.0000 mg | Freq: Once | INTRAMUSCULAR | Status: AC
  Administered 2011-06-01: 4 mg via INTRAVENOUS
  Filled 2011-06-01: qty 2

## 2011-06-01 MED ORDER — CIPROFLOXACIN HCL 500 MG PO TABS: 500.0000 mg | ORAL_TABLET | Freq: Two times a day (BID) | ORAL | Status: DC

## 2011-06-01 MED ORDER — SODIUM CHLORIDE 0.9 % IV SOLN
Freq: Once | INTRAVENOUS | Status: AC
  Administered 2011-06-01: 21:00:00 via INTRAVENOUS

## 2011-06-01 MED ORDER — ONDANSETRON 8 MG PO TBDP: 8.0000 mg | ORAL_TABLET | Freq: Three times a day (TID) | ORAL | Status: DC | PRN

## 2011-06-01 MED ORDER — IOHEXOL 300 MG/ML  SOLN
100.0000 mL | Freq: Once | INTRAMUSCULAR | Status: AC | PRN
  Administered 2011-06-01: 100 mL via INTRAVENOUS

## 2011-06-01 MED ORDER — OXYCODONE-ACETAMINOPHEN 5-325 MG PO TABS: 1.0000 | ORAL_TABLET | ORAL | Status: DC | PRN

## 2011-06-01 NOTE — ED Notes (Signed)
Lab at bedside to obtain blood 

## 2011-06-01 NOTE — ED Notes (Signed)
Pt is c/o left flank pain that started a couple days ago and has progressively gotten worse  Pt states has nausea without vomiting  Pt states she was hospitalized in Sept with diverticulitis  States pain feels the same

## 2011-06-01 NOTE — ED Provider Notes (Signed)
History     CSN: [id number]  Arrival date & time 06/01/11  1908   First MD Initiated Contact with Patient 06/01/11 2027      Chief Complaint  Patient presents with  . Flank Pain    (Consider location/radiation/quality/duration/timing/severity/associated sxs/prior treatment) The history is provided by the patient.   the patient reports 10 days of worsening left lower quadrant pain with nausea.  She denies vomiting or diarrhea.  She denies melena or hematochezia.  She has no dysuria or urinary frequency.  She does report occasional radiation of her pain to her left flank but denies left flank pain or tenderness at this time.  She reports this feels similar to the episode of diverticulitis that hospitalized her in the past.  She never required intervention such as percutaneous drainage per the patient.  Her pain is moderate at this time.  Her pain is constant.  Her pain is worsened by movement and palpation.  Nothing improves her pain.  Past Medical History  Diagnosis Date  . Other constipation   . Abdominal pain, unspecified site   . Hypertension   . Migraine   . IBS (irritable bowel syndrome)   . Umbilical hernia   . Uterine fibroid   . Chlamydia   . Hiatal hernia   . Esophageal reflux   . Personal history of colonic polyps 09/12/2010    tubular adenoma  . Lactose intolerance     Past Surgical History  Procedure Date  . Appendectomy   . Tubal ligation   . Appendectomy     Family History  Problem Relation Age of Onset  . Colon cancer Neg Hx   . Hypertension Mother   . Cancer Mother     not sure of type    History  Substance Use Topics  . Smoking status: Never Smoker   . Smokeless tobacco: Never Used  . Alcohol Use: Yes     occasionally    OB History    Grav Para Term Preterm Abortions TAB SAB Ect Mult Living                  Review of Systems  Genitourinary: Positive for flank pain.  All other systems reviewed and are negative.    Allergies  Review  of patient's allergies indicates no known allergies.  Home Medications   Current Outpatient Rx  Name Route Sig Dispense Refill  . AMLODIPINE BESYLATE 10 MG PO TABS Oral Take 10 mg by mouth daily.    Marland Kitchen ESOMEPRAZOLE MAGNESIUM 40 MG PO CPDR Oral Take 40 mg by mouth daily before breakfast.    . IBUPROFEN 600 MG PO TABS Oral Take 600 mg by mouth every 6 (six) hours as needed. Pain      BP 137/92  Pulse 90  Temp(Src) 98 F (36.7 C) (Oral)  Resp 16  Wt 210 lb (95.255 kg)  SpO2 100%  LMP 04/25/2011  Physical Exam  Nursing note and vitals reviewed. Constitutional: She is oriented to person, place, and time. She appears well-developed and well-nourished. No distress.  HENT:  Head: Normocephalic and atraumatic.  Eyes: EOM are normal.  Neck: Normal range of motion.  Cardiovascular: Normal rate, regular rhythm and normal heart sounds.   Pulmonary/Chest: Effort normal and breath sounds normal.  Abdominal: Soft. She exhibits no distension.       Mild tenderness in left lower quadrant without guarding or rebound.  No left CVA tenderness  Musculoskeletal: Normal range of motion.  Neurological: She is  alert and oriented to person, place, and time.  Skin: Skin is warm and dry.  Psychiatric: She has a normal mood and affect. Judgment normal.    ED Course  Procedures (including critical care time)  Labs Reviewed  BASIC METABOLIC PANEL - Abnormal; Notable for the following:    Glucose, Bld 105 (*)    All other components within normal limits  URINALYSIS, ROUTINE W REFLEX MICROSCOPIC  PREGNANCY, URINE  CBC   Ct Abdomen Pelvis W Contrast  06/01/2011  *RADIOLOGY REPORT*  Clinical Data: Left lower quadrant and flank pain.  CT ABDOMEN AND PELVIS WITH CONTRAST  Technique:  Multidetector CT imaging of the abdomen and pelvis was performed following the standard protocol during bolus administration of intravenous contrast.  Contrast: OMNIPAQUE IOHEXOL 300 MG/ML  SOLN  Comparison: 11/08/2010   Findings: No focal abnormalities seen in the liver or spleen.  The stomach, duodenum, pancreas, gallbladder, adrenal glands, and kidneys are unremarkable.  No abdominal aortic aneurysm.  No free fluid or lymphadenopathy in the abdomen.  Small umbilical hernia contains only fat.  Imaging through the pelvis shows a trace amount of intraperitoneal free fluid.  There is no pelvic sidewall lymphadenopathy.  Bladder is unremarkable.  Uterus has normal imaging features.  There is no adnexal mass.  The short segmental wall thickening is identified in the proximal sigmoid colon.  This is associated with pericolonic edema/inflammation in several lymph nodes within the sigmoid mesocolon.  These changes are superimposed on a background of colonic diverticulitis.  The terminal ileum is normal. Nonvisualization of the appendix is consistent with the reported history of appendectomy.  Bone windows reveal no worrisome lytic or sclerotic osseous lesions.  Wedge deformity of the L1 vertebral body is stable.  IMPRESSION: Short segment of wall thickening with pericolonic edema/inflammation in the proximal sigmoid colon.  Imaging features are most likely related to diverticulitis without perforation or abscess.  Close follow-up is recommended as colonic neoplasm can present with similar imaging features.  Original Report Authenticated By: ERIC A. MANSELL, M.D.     1. Diverticulitis       MDM  Her symptoms may represent diverticulitis.  Labs and CT scan pending.  UA pending.  My suspicion for ureteral stone is lower.  We'll treat her pain at this time  11:42 PM The patient feels better at this time.  Discharge home with Cipro and Flagyl for diverticulitis.  Home with pain medicine and antinausea medicine.  She understands to return the emergency department for new worsening symptoms        Lyanne Co, MD 06/01/11 2343

## 2011-06-01 NOTE — Discharge Instructions (Signed)
Diverticulitis A diverticulum is a small pouch or sac on the colon. Diverticulosis is the presence of these diverticula on the colon. Diverticulitis is the irritation (inflammation) or infection of diverticula. CAUSES  The colon and its diverticula contain bacteria. If food particles block the tiny opening to a diverticulum, the bacteria inside can grow and cause an increase in pressure. This leads to infection and inflammation and is called diverticulitis. SYMPTOMS   Abdominal pain and tenderness. Usually, the pain is located on the left side of your abdomen. However, it could be located elsewhere.   Fever.   Bloating.   Feeling sick to your stomach (nausea).   Throwing up (vomiting).   Abnormal stools.  DIAGNOSIS  Your caregiver will take a history and perform a physical exam. Since many things can cause abdominal pain, other tests may be necessary. Tests may include:  Blood tests.   Urine tests.   X-ray of the abdomen.   CT scan of the abdomen.  Sometimes, surgery is needed to determine if diverticulitis or other conditions are causing your symptoms. TREATMENT  Most of the time, you can be treated without surgery. Treatment includes:  Resting the bowels by only having liquids for a few days. As you improve, you will need to eat a low-fiber diet.   Intravenous (IV) fluids if you are losing body fluids (dehydrated).   Antibiotic medicines that treat infections may be given.   Pain and nausea medicine, if needed.   Surgery if the inflamed diverticulum has burst.  HOME CARE INSTRUCTIONS   Try a clear liquid diet (broth, tea, or water for as long as directed by your caregiver). You may then gradually begin a low-fiber diet as tolerated. A low-fiber diet is a diet with less than 10 grams of fiber. Choose the foods below to reduce fiber in the diet:   Boehme breads, cereals, rice, and pasta.   Cooked fruits and vegetables or soft fresh fruits and vegetables without the skin.     Ground or well-cooked tender beef, ham, veal, lamb, pork, or poultry.   Eggs and seafood.   After your diverticulitis symptoms have improved, your caregiver may put you on a high-fiber diet. A high-fiber diet includes 14 grams of fiber for every 1000 calories consumed. For a standard 2000 calorie diet, you would need 28 grams of fiber. Follow these diet guidelines to help you increase the fiber in your diet. It is important to slowly increase the amount fiber in your diet to avoid gas, constipation, and bloating.   Choose whole-grain breads, cereals, pasta, and brown rice.   Choose fresh fruits and vegetables with the skin on. Do not overcook vegetables because the more vegetables are cooked, the more fiber is lost.   Choose more nuts, seeds, legumes, dried peas, beans, and lentils.   Look for food products that have greater than 3 grams of fiber per serving on the Nutrition Facts label.   Take all medicine as directed by your caregiver.   If your caregiver has given you a follow-up appointment, it is very important that you go. Not going could result in lasting (chronic) or permanent injury, pain, and disability. If there is any problem keeping the appointment, call to reschedule.  SEEK MEDICAL CARE IF:   Your pain does not improve.   You have a hard time advancing your diet beyond clear liquids.   Your bowel movements do not return to normal.  SEEK IMMEDIATE MEDICAL CARE IF:   Your pain becomes   worse.   You have an oral temperature above 102 F (38.9 C), not controlled by medicine.   You have repeated vomiting.   You have bloody or black, tarry stools.   Symptoms that brought you to your caregiver become worse or are not getting better.  MAKE SURE YOU:   Understand these instructions.   Will watch your condition.   Will get help right away if you are not doing well or get worse.  Document Released: 10/22/2004 Document Revised: 01/01/2011 Document Reviewed:  02/17/2010 [employer] Patient Information 2012 [employer]. 

## 2011-06-02 ENCOUNTER — Inpatient Hospital Stay (HOSPITAL_COMMUNITY)
Admission: EM | Admit: 2011-06-02 | Discharge: 2011-06-07 | DRG: 392 | Disposition: A | Discharge status: Home / Self Care | Payer: Medicaid Other | Attending: Internal Medicine | Admitting: Internal Medicine

## 2011-06-02 ENCOUNTER — Encounter (HOSPITAL_COMMUNITY): Payer: Self-pay | Admitting: Family Medicine

## 2011-06-02 DIAGNOSIS — I5022 Chronic systolic (congestive) heart failure: Secondary | ICD-10-CM

## 2011-06-02 DIAGNOSIS — I10 Essential (primary) hypertension: Secondary | ICD-10-CM | POA: Diagnosis present

## 2011-06-02 DIAGNOSIS — E860 Dehydration: Secondary | ICD-10-CM

## 2011-06-02 DIAGNOSIS — K5732 Diverticulitis of large intestine without perforation or abscess without bleeding: Principal | ICD-10-CM | POA: Diagnosis present

## 2011-06-02 DIAGNOSIS — K219 Gastro-esophageal reflux disease without esophagitis: Secondary | ICD-10-CM | POA: Diagnosis present

## 2011-06-02 DIAGNOSIS — K5909 Other constipation: Secondary | ICD-10-CM | POA: Diagnosis present

## 2011-06-02 DIAGNOSIS — K5792 Diverticulitis of intestine, part unspecified, without perforation or abscess without bleeding: Secondary | ICD-10-CM

## 2011-06-02 DIAGNOSIS — R112 Nausea with vomiting, unspecified: Secondary | ICD-10-CM

## 2011-06-02 DIAGNOSIS — H6690 Otitis media, unspecified, unspecified ear: Secondary | ICD-10-CM | POA: Diagnosis present

## 2011-06-02 DIAGNOSIS — D649 Anemia, unspecified: Secondary | ICD-10-CM

## 2011-06-02 LAB — POCT I-STAT, CHEM 8
BUN: 4 mg/dL — ABNORMAL LOW (ref 6–23)
Chloride: 101 mEq/L (ref 96–112)
Creatinine, Ser: 0.7 mg/dL (ref 0.50–1.10)
Glucose, Bld: 120 mg/dL — ABNORMAL HIGH (ref 70–99)
Potassium: 3.8 mEq/L (ref 3.5–5.1)
Sodium: 139 mEq/L (ref 135–145)

## 2011-06-02 LAB — CBC
HCT: 35.3 % — ABNORMAL LOW (ref 36.0–46.0)
Hemoglobin: 11.6 g/dL — ABNORMAL LOW (ref 12.0–15.0)
MCHC: 32.9 g/dL (ref 30.0–36.0)
RBC: 4.17 MIL/uL (ref 3.87–5.11)
WBC: 10.2 10*3/uL (ref 4.0–10.5)

## 2011-06-02 MED ORDER — SENNA 8.6 MG PO TABS
2.0000 | ORAL_TABLET | Freq: Every evening | ORAL | Status: DC | PRN
  Administered 2011-06-06: 17.2 mg via ORAL
  Filled 2011-06-02: qty 2

## 2011-06-02 MED ORDER — ACETAMINOPHEN 325 MG PO TABS: 650.0000 mg | ORAL_TABLET | Freq: Four times a day (QID) | ORAL | Status: DC | PRN

## 2011-06-02 MED ORDER — LORAZEPAM 2 MG/ML IJ SOLN
1.0000 mg | Freq: Once | INTRAMUSCULAR | Status: AC
  Administered 2011-06-02: 1 mg via INTRAVENOUS
  Filled 2011-06-02: qty 1

## 2011-06-02 MED ORDER — ALUM & MAG HYDROXIDE-SIMETH 200-200-20 MG/5ML PO SUSP: 30.0000 mL | Freq: Four times a day (QID) | ORAL | Status: DC | PRN

## 2011-06-02 MED ORDER — HYDROCODONE-ACETAMINOPHEN 5-325 MG PO TABS
1.0000 | ORAL_TABLET | ORAL | Status: DC | PRN
  Filled 2011-06-02: qty 2

## 2011-06-02 MED ORDER — DOCUSATE SODIUM 100 MG PO CAPS
100.0000 mg | ORAL_CAPSULE | Freq: Two times a day (BID) | ORAL | Status: DC
  Administered 2011-06-03 – 2011-06-07 (×5): 100 mg via ORAL
  Filled 2011-06-02 (×11): qty 1

## 2011-06-02 MED ORDER — ALBUTEROL SULFATE (5 MG/ML) 0.5% IN NEBU: 2.5000 mg | INHALATION_SOLUTION | RESPIRATORY_TRACT | Status: DC | PRN

## 2011-06-02 MED ORDER — AMLODIPINE BESYLATE 10 MG PO TABS
10.0000 mg | ORAL_TABLET | Freq: Every day | ORAL | Status: DC
  Administered 2011-06-02 – 2011-06-07 (×6): 10 mg via ORAL
  Filled 2011-06-02 (×6): qty 1

## 2011-06-02 MED ORDER — HYDROMORPHONE HCL PF 1 MG/ML IJ SOLN
1.0000 mg | Freq: Once | INTRAMUSCULAR | Status: AC
  Administered 2011-06-02: 1 mg via INTRAVENOUS
  Filled 2011-06-02: qty 1

## 2011-06-02 MED ORDER — LIDOCAINE HCL 2 % IJ SOLN
INTRAMUSCULAR | Status: AC
  Administered 2011-06-02: 13:00:00
  Filled 2011-06-02: qty 1

## 2011-06-02 MED ORDER — HYDROMORPHONE HCL PF 1 MG/ML IJ SOLN
1.0000 mg | INTRAMUSCULAR | Status: DC | PRN
  Administered 2011-06-02 – 2011-06-04 (×6): 1 mg via INTRAVENOUS
  Filled 2011-06-02 (×6): qty 1

## 2011-06-02 MED ORDER — ONDANSETRON 8 MG PO TBDP
ORAL_TABLET | ORAL | Status: AC
  Administered 2011-06-02: 04:00:00
  Filled 2011-06-02: qty 1

## 2011-06-02 MED ORDER — HYDROMORPHONE HCL PF 1 MG/ML IJ SOLN: 1.0000 mg | INTRAMUSCULAR | Status: DC | PRN

## 2011-06-02 MED ORDER — SODIUM CHLORIDE 0.9 % IV BOLUS (SEPSIS)
1000.0000 mL | Freq: Once | INTRAVENOUS | Status: AC
  Administered 2011-06-02: 1000 mL via INTRAVENOUS

## 2011-06-02 MED ORDER — METRONIDAZOLE IN NACL 5-0.79 MG/ML-% IV SOLN
500.0000 mg | Freq: Once | INTRAVENOUS | Status: AC
  Administered 2011-06-02: 500 mg via INTRAVENOUS
  Filled 2011-06-02: qty 100

## 2011-06-02 MED ORDER — ONDANSETRON HCL 4 MG/2ML IJ SOLN
4.0000 mg | Freq: Once | INTRAMUSCULAR | Status: AC
  Administered 2011-06-02: 4 mg via INTRAVENOUS
  Filled 2011-06-02: qty 2

## 2011-06-02 MED ORDER — HYDROMORPHONE HCL PF 2 MG/ML IJ SOLN
2.0000 mg | Freq: Once | INTRAMUSCULAR | Status: AC
  Administered 2011-06-02: 2 mg via INTRAVENOUS
  Filled 2011-06-02: qty 1

## 2011-06-02 MED ORDER — MORPHINE SULFATE 4 MG/ML IJ SOLN
4.0000 mg | Freq: Once | INTRAMUSCULAR | Status: AC
  Administered 2011-06-02: 4 mg via INTRAVENOUS
  Filled 2011-06-02: qty 1

## 2011-06-02 MED ORDER — PANTOPRAZOLE SODIUM 40 MG IV SOLR
40.0000 mg | Freq: Every day | INTRAVENOUS | Status: DC
  Administered 2011-06-02 – 2011-06-06 (×5): 40 mg via INTRAVENOUS
  Filled 2011-06-02 (×6): qty 40

## 2011-06-02 MED ORDER — METRONIDAZOLE IN NACL 5-0.79 MG/ML-% IV SOLN
500.0000 mg | Freq: Three times a day (TID) | INTRAVENOUS | Status: DC
  Administered 2011-06-02 – 2011-06-06 (×12): 500 mg via INTRAVENOUS
  Filled 2011-06-02 (×14): qty 100

## 2011-06-02 MED ORDER — POTASSIUM CHLORIDE IN NACL 20-0.9 MEQ/L-% IV SOLN
INTRAVENOUS | Status: DC
  Administered 2011-06-02: 17:00:00 via INTRAVENOUS
  Administered 2011-06-03: 1000 mL via INTRAVENOUS
  Administered 2011-06-03 – 2011-06-07 (×7): via INTRAVENOUS
  Filled 2011-06-02 (×16): qty 1000

## 2011-06-02 MED ORDER — HYDROMORPHONE HCL 1 MG/ML PO LIQD: 1.0000 mg | Freq: Once | ORAL | Status: DC

## 2011-06-02 MED ORDER — ACETAMINOPHEN 650 MG RE SUPP: 650.0000 mg | Freq: Four times a day (QID) | RECTAL | Status: DC | PRN

## 2011-06-02 MED ORDER — ONDANSETRON HCL 4 MG/2ML IJ SOLN
4.0000 mg | Freq: Four times a day (QID) | INTRAMUSCULAR | Status: DC | PRN
  Administered 2011-06-02 – 2011-06-05 (×5): 4 mg via INTRAVENOUS
  Filled 2011-06-02 (×5): qty 2

## 2011-06-02 MED ORDER — SODIUM CHLORIDE 0.9 % IV SOLN: INTRAVENOUS | Status: DC

## 2011-06-02 MED ORDER — ONDANSETRON HCL 4 MG/2ML IJ SOLN: 4.0000 mg | Freq: Three times a day (TID) | INTRAMUSCULAR | Status: DC | PRN

## 2011-06-02 MED ORDER — CIPROFLOXACIN IN D5W 400 MG/200ML IV SOLN
400.0000 mg | Freq: Once | INTRAVENOUS | Status: AC
  Administered 2011-06-02: 400 mg via INTRAVENOUS
  Filled 2011-06-02: qty 200

## 2011-06-02 MED ORDER — CIPROFLOXACIN IN D5W 400 MG/200ML IV SOLN
400.0000 mg | Freq: Two times a day (BID) | INTRAVENOUS | Status: DC
  Administered 2011-06-02 – 2011-06-06 (×8): 400 mg via INTRAVENOUS
  Filled 2011-06-02 (×11): qty 200

## 2011-06-02 MED ORDER — ONDANSETRON HCL 4 MG PO TABS: 4.0000 mg | ORAL_TABLET | Freq: Four times a day (QID) | ORAL | Status: DC | PRN

## 2011-06-02 NOTE — ED Notes (Signed)
Attempt x1 to call report to 3E will call back shortly.

## 2011-06-02 NOTE — ED Notes (Signed)
Pt reports having left-sided abdominal pain x3 days with N/V. Reports she was seen here last night for same and dx with diverticulitis. Reports hx of same.

## 2011-06-02 NOTE — Progress Notes (Signed)
Pt's registered medicaid card does not contain the name of a pcp Offered pt a list of available medicaid providers She needs to have her case worker add medicaid provider name prior to seeing a new provider.  Pt made aware of this

## 2011-06-02 NOTE — H&P (Addendum)
PCP:   No primary provider on file.   Chief Complaint:  Abdominal pain, nausea and vomiting.  HPI: 41 year old African American female patient with history of hypertension, diverticulitis in September of 2012, constipation, irritable bowel syndrome who presents to the emergency department with abdominal pain, nausea and vomiting. She indicates that her symptoms first started approximately 2 weeks ago with intermittent left lower quadrant abdominal pain which was mild and rates it at 4/10. However in the last 3 days the symptoms have gotten worse with pain now 10/10 in severity, nonradiating, associated with fevers (temperature not checked) and chills and intractable nausea and vomiting. Vomitus consists of yellowish clear liquids without any blood or coffee grounds. She is constipated and her last BM was 2-3 days ago. She is passing flatus. She presented to the emergency department with these symptoms on 5/6. Her CT revealed sigmoid diverticulitis. She was discharged home on oral Cipro, Flagyl, pain medications and nausea medications. She however was unable to fill her antibiotics. Today she returns with worsening/persistent above symptoms. The hospitalist service was requested to admit for further evaluation and management.  Past Medical History: Past Medical History  Diagnosis Date  . Other constipation   . Abdominal pain, unspecified site   . Hypertension   . Migraine   . IBS (irritable bowel syndrome)   . Umbilical hernia   . Uterine fibroid   . Chlamydia   . Hiatal hernia   . Esophageal reflux   . Personal history of colonic polyps 09/12/2010    tubular adenoma  . Lactose intolerance     Past Surgical History: Past Surgical History  Procedure Date  . Appendectomy   . Tubal ligation   . Appendectomy     Allergies:  No Known Allergies  Medications: Prior to Admission medications   Medication Sig Start Date End Date Taking? Authorizing Provider  amLODipine (NORVASC) 10 MG  tablet Take 10 mg by mouth daily.   Yes Historical Provider, MD  esomeprazole (NEXIUM) 40 MG capsule Take 40 mg by mouth daily before breakfast.   Yes Historical Provider, MD  ibuprofen (ADVIL,MOTRIN) 600 MG tablet Take 600 mg by mouth every 6 (six) hours as needed. Pain   Yes Historical Provider, MD  ciprofloxacin (CIPRO) 500 MG tablet Take 1 tablet (500 mg total) by mouth 2 (two) times daily. 06/01/11 06/11/11  Lyanne Co, MD  metroNIDAZOLE (FLAGYL) 500 MG tablet Take 1 tablet (500 mg total) by mouth 2 (two) times daily. 06/01/11 06/11/11  Lyanne Co, MD  ondansetron (ZOFRAN ODT) 8 MG disintegrating tablet Take 1 tablet (8 mg total) by mouth every 8 (eight) hours as needed for nausea. 06/01/11 06/08/11  Lyanne Co, MD  oxyCODONE-acetaminophen (PERCOCET) 5-325 MG per tablet Take 1 tablet by mouth every 4 (four) hours as needed for pain. 06/01/11 06/11/11  Lyanne Co, MD    Family History: Family History  Problem Relation Age of Onset  . Colon cancer Neg Hx   . Hypertension Mother   . Cancer Mother     not sure of type    Social History:  reports that she has never smoked. She has never used smokeless tobacco. She reports that she drinks alcohol. She reports that she does not use illicit drugs. patient lives with her to children and is independent of activities of daily living. She was supposed to start a new job today.  Review of Systems:  Patient gives approximately 2 months history of intermittent pain and drainage from  her right ear which is foul smelling. She denies deafness or headache. She was seen in the emergency department on 4/1 and assessed to have serous otitis media with rupture of tympanic membranes. Her symptoms seem to be persisting.  Physical Exam: Filed Vitals:   06/02/11 0921 06/02/11 1234 06/02/11 1337  BP: 138/81 138/81 141/86  Pulse: 100 94 99  Temp: 98.6 F (37 C) 98.1 F (36.7 C)   TempSrc: Oral Oral   Resp: 16 16 18   Height: 5\' 1"  (1.549 m)    Weight:  95.255 kg (210 lb)    SpO2: 98% 95% 99%   General appearance: Moderately built and nourished female patient who is lying comfortably in the gurney and is in intermittent painful distress. She is somnolent from pain medications but is arousable to provide appropriate history. Head: Nontraumatic and normocephalic.  Eyes: Pupils equally reacting to light and accommodation.  Ears: Patient has a swab in her right ear which was removed. This was slightly soiled with tan colored drainage. Mild erythema of the deep external auditory canal. The tympanic membrane seems to be perforated between the 9-12 o'clock region. Hearing seems to be normal.  Nose: No acute findings. No sinus tenderness.  Throat: Mucosa is dry. No oral thrush.  Neck: Supple. No JVD or carotid bruit. Lymph nodes: No lymphadenopathy.  Resp: Clear to auscultation. No increased work of breathing.  Cardio: First and second heart sounds heard, regular rate and rythm. No murmurs or JVD or gallop or pedal edema.   GI: Non distended. Tender in the left lower quadrant and suprapubic region but abdomen is generally soft without any rigidity guarding or rebound. No organomegaly or masses appreciated. Normal bowel sounds heard.  Extremities: symmetric 5/5 power. Skin: No other acute findings.  Neurologic: Somnolent but easily arousable and oriented. No focal neurological deficits.   Labs on Admission:   [facility] 06/02/11 1238 06/01/11 2100  NA 139 135  K 3.8 4.4  CL 101 103  CO2 -- 23  GLUCOSE 120* 105*  BUN 4* 8  CREATININE 0.70 0.68  CALCIUM -- 8.7  MG -- --  PHOS -- --   No results found for this basename: AST:2,ALT:2,ALKPHOS:2,BILITOT:2,PROT:2,ALBUMIN:2 in the last 72 hours No results found for this basename: LIPASE:2,AMYLASE:2 in the last 72 hours  Basename 06/02/11 1238 06/02/11 1155  WBC -- 10.2  NEUTROABS -- --  HGB 12.9 11.6*  HCT 38.0 35.3*  MCV -- 84.7  PLT -- 286   No results found for this basename:  CKTOTAL:3,CKMB:3,CKMBINDEX:3,TROPONINI:3 in the last 72 hours No results found for this basename: TSH,T4TOTAL,FREET3,T3FREE,THYROIDAB in the last 72 hours No results found for this basename: VITAMINB12:2,FOLATE:2,FERRITIN:2,TIBC:2,IRON:2,RETICCTPCT:2 in the last 72 hours  Other lab data:  #1. Urine pregnancy test done on 5/6: Negative. #2. Urine analysis done on 5/6: Negative for features of urinary tract infection.  Radiological Exams on Admission: Ct Abdomen Pelvis W Contrast  06/01/2011  *RADIOLOGY REPORT*  Clinical Data: Left lower quadrant and flank pain.  CT ABDOMEN AND PELVIS WITH CONTRAST  Technique:  Multidetector CT imaging of the abdomen and pelvis was performed following the standard protocol during bolus administration of intravenous contrast.  Contrast: OMNIPAQUE IOHEXOL 300 MG/ML  SOLN  Comparison: 11/08/2010  Findings: No focal abnormalities seen in the liver or spleen.  The stomach, duodenum, pancreas, gallbladder, adrenal glands, and kidneys are unremarkable.  No abdominal aortic aneurysm.  No free fluid or lymphadenopathy in the abdomen.  Small umbilical hernia contains only fat.  Imaging  through the pelvis shows a trace amount of intraperitoneal free fluid.  There is no pelvic sidewall lymphadenopathy.  Bladder is unremarkable.  Uterus has normal imaging features.  There is no adnexal mass.  The short segmental wall thickening is identified in the proximal sigmoid colon.  This is associated with pericolonic edema/inflammation in several lymph nodes within the sigmoid mesocolon.  These changes are superimposed on a background of colonic diverticulitis.  The terminal ileum is normal. Nonvisualization of the appendix is consistent with the reported history of appendectomy.  Bone windows reveal no worrisome lytic or sclerotic osseous lesions.  Wedge deformity of the L1 vertebral body is stable.  IMPRESSION: Short segment of wall thickening with pericolonic edema/inflammation in the  proximal sigmoid colon.  Imaging features are most likely related to diverticulitis without perforation or abscess.  Close follow-up is recommended as colonic neoplasm can present with similar imaging features.  Original Report Authenticated By: ERIC A. MANSELL, M.D.      Assessment/Plan Present on Admission:  .Diverticulitis of sigmoid colon .Nausea and vomiting .Otitis .HTN (hypertension) .CONSTIPATION, CHRONIC .GERD (gastroesophageal reflux disease)  1. Acute sigmoid diverticulitis: Admit patient. Treat empirically with intravenous ciprofloxacin, metronidazole and pain and nausea medications. IV fluids. Clear liquids orally as tolerated. Monitor clinically. This is probably precipitated by chronic constipation. Once her acute symptoms have resolved she should be on a bowel regimen to avoid constipation. Also she needs to be seen by gastroenterologist for possible colonoscopy a few weeks of her acute episode. This is advised to the patient and she verbalizes understanding.  2. Intractable nausea and vomiting: Secondary to problem #1. Oral clear liquids as tolerated and antinausea medications. Placed on IV Protonix. 3. Dehydration: secondary to poor oral intake and nausea and vomiting: IV fluids. 4. Hypertension: Mildly uncontrolled. Continue home amlodipine. 5. Right otitis media: Cipro and Flagyl should cover this. Patient is advised to see the ENT M.D. on discharge as outpatient. She verbalizes understanding. 6. Mild anemia: Stable. Follow CBC tomorrow. 7. GERD. IV PPI. 8. Full code    Briana Wade 06/02/2011, 3:11 PM

## 2011-06-02 NOTE — ED Notes (Signed)
Pt ambulated from triage to room. NAD noted at this time. Pt getting undressed, changing into gown.

## 2011-06-02 NOTE — ED Notes (Signed)
Attempt x1 to start IV by this RN, unsuccessful. Melanie, RN will attempt IV start.

## 2011-06-02 NOTE — ED Notes (Signed)
MD at bedside. 

## 2011-06-02 NOTE — Progress Notes (Signed)
Noted medicaid pt without pcp noted Spoke with pt who was dozing in and out She states she does not have a pcp and prefers to see [facility]

## 2011-06-02 NOTE — ED Provider Notes (Signed)
History     CSN: [id number]  Arrival date & time 06/02/11  0910   First MD Initiated Contact with Patient 06/02/11 [phone]      Chief Complaint  Patient presents with  . Abdominal Pain  . Nausea  . Emesis    (Consider location/radiation/quality/duration/timing/severity/associated sxs/prior treatment) HPI Complains of left-sided abdominal pain for 3 days nonradiating severe sharp in quality. Patient seen here yesterday had CT scan consistent with diverticulitis prescribed Zofran, Percocet Cipro and Flagyl, continues to vomit has not filled her prescriptions feels worse today. Vomited 3 times this morning. No fever no other complaint Past Medical History  Diagnosis Date  . Other constipation   . Abdominal pain, unspecified site   . Hypertension   . Migraine   . IBS (irritable bowel syndrome)   . Umbilical hernia   . Uterine fibroid   . Chlamydia   . Hiatal hernia   . Esophageal reflux   . Personal history of colonic polyps 09/12/2010    tubular adenoma  . Lactose intolerance    Diverticulitis Past Surgical History  Procedure Date  . Appendectomy   . Tubal ligation   . Appendectomy     Family History  Problem Relation Age of Onset  . Colon cancer Neg Hx   . Hypertension Mother   . Cancer Mother     not sure of type    History  Substance Use Topics  . Smoking status: Never Smoker   . Smokeless tobacco: Never Used  . Alcohol Use: Yes     occasionally    OB History    Grav Para Term Preterm Abortions TAB SAB Ect Mult Living                  Review of Systems  Constitutional: Negative.   HENT: Negative.   Respiratory: Negative.   Cardiovascular: Negative.   Gastrointestinal: Positive for nausea, vomiting and abdominal pain.  Musculoskeletal: Negative.   Skin: Negative.   Neurological: Negative.   Hematological: Negative.   Psychiatric/Behavioral: Negative.   All other systems reviewed and are negative.    Allergies  Review of patient's allergies  indicates no known allergies.  Home Medications   Current Outpatient Rx  Name Route Sig Dispense Refill  . AMLODIPINE BESYLATE 10 MG PO TABS Oral Take 10 mg by mouth daily.    Marland Kitchen ESOMEPRAZOLE MAGNESIUM 40 MG PO CPDR Oral Take 40 mg by mouth daily before breakfast.    . IBUPROFEN 600 MG PO TABS Oral Take 600 mg by mouth every 6 (six) hours as needed. Pain    . CIPROFLOXACIN HCL 500 MG PO TABS Oral Take 1 tablet (500 mg total) by mouth 2 (two) times daily. 14 tablet 0  . METRONIDAZOLE 500 MG PO TABS Oral Take 1 tablet (500 mg total) by mouth 2 (two) times daily. 14 tablet 0  . ONDANSETRON 8 MG PO TBDP Oral Take 1 tablet (8 mg total) by mouth every 8 (eight) hours as needed for nausea. 10 tablet 0  . OXYCODONE-ACETAMINOPHEN 5-325 MG PO TABS Oral Take 1 tablet by mouth every 4 (four) hours as needed for pain. 25 tablet 0    BP 138/81  Pulse 100  Temp(Src) 98.6 F (37 C) (Oral)  Resp 16  Ht 5\' 1"  (1.549 m)  Wt 210 lb (95.255 kg)  BMI 39.68 kg/m2  SpO2 98%  LMP 04/25/2011  Physical Exam  Nursing note and vitals reviewed. Constitutional: She appears well-developed and well-nourished.  HENT:  Head: Normocephalic and atraumatic.  Eyes: Conjunctivae are normal. Pupils are equal, round, and reactive to light.  Neck: Neck supple. No tracheal deviation present. No thyromegaly present.  Cardiovascular: Normal rate and regular rhythm.   No murmur heard. Pulmonary/Chest: Effort normal and breath sounds normal.  Abdominal: Soft. Bowel sounds are normal. She exhibits no distension and no mass. There is tenderness. There is no rebound and no guarding.       Obese, tender at left lower quadrant  Musculoskeletal: Normal range of motion. She exhibits no edema and no tenderness.  Neurological: She is alert. Coordination normal.  Skin: Skin is warm and dry. No rash noted.  Psychiatric: She has a normal mood and affect.    ED Course  Procedures (including critical care time) Nursing unable to  draw blood or to obtain peripheral IV access Angiocath insertion Performed by: Doug Sou  Consent: Verbal consent obtained. Risks and benefits: risks, benefits and alternatives were discussed Time out: Immediately prior to procedure a "time out" was called to verify the correct patient, procedure, equipment, support staff and site/side marked as required.  Preparation: Patient was prepped and draped in the usual sterile fashion.  skin anesthetized locally with 2%  Vein Location: Left external jugular vein      Gauge: 20  Normal blood return and flush without difficulty Patient tolerance: Patient tolerated the procedure well with no immediate complications.   Labs Reviewed - No data to display Ct Abdomen Pelvis W Contrast  06/01/2011  *RADIOLOGY REPORT*  Clinical Data: Left lower quadrant and flank pain.  CT ABDOMEN AND PELVIS WITH CONTRAST  Technique:  Multidetector CT imaging of the abdomen and pelvis was performed following the standard protocol during bolus administration of intravenous contrast.  Contrast: OMNIPAQUE IOHEXOL 300 MG/ML  SOLN  Comparison: 11/08/2010  Findings: No focal abnormalities seen in the liver or spleen.  The stomach, duodenum, pancreas, gallbladder, adrenal glands, and kidneys are unremarkable.  No abdominal aortic aneurysm.  No free fluid or lymphadenopathy in the abdomen.  Small umbilical hernia contains only fat.  Imaging through the pelvis shows a trace amount of intraperitoneal free fluid.  There is no pelvic sidewall lymphadenopathy.  Bladder is unremarkable.  Uterus has normal imaging features.  There is no adnexal mass.  The short segmental wall thickening is identified in the proximal sigmoid colon.  This is associated with pericolonic edema/inflammation in several lymph nodes within the sigmoid mesocolon.  These changes are superimposed on a background of colonic diverticulitis.  The terminal ileum is normal. Nonvisualization of the appendix is  consistent with the reported history of appendectomy.  Bone windows reveal no worrisome lytic or sclerotic osseous lesions.  Wedge deformity of the L1 vertebral body is stable.  IMPRESSION: Short segment of wall thickening with pericolonic edema/inflammation in the proximal sigmoid colon.  Imaging features are most likely related to diverticulitis without perforation or abscess.  Close follow-up is recommended as colonic neoplasm can present with similar imaging features.  Original Report Authenticated By: ERIC A. MANSELL, M.D.   Results for orders placed during the hospital encounter of 06/02/11  CBC      Component Value Range   WBC 10.2  4.0 - 10.5 (K/uL)   RBC 4.17  3.87 - 5.11 (MIL/uL)   Hemoglobin 11.6 (*) 12.0 - 15.0 (g/dL)   HCT 16.1 (*) 09.6 - 46.0 (%)   MCV 84.7  78.0 - 100.0 (fL)   MCH 27.8  26.0 - 34.0 (pg)   MCHC 32.9  30.0 - 36.0 (g/dL)   RDW 16.1  09.6 - 04.5 (%)   Platelets 286  150 - 400 (K/uL)  POCT I-STAT, CHEM 8      Component Value Range   Sodium 139  135 - 145 (mEq/L)   Potassium 3.8  3.5 - 5.1 (mEq/L)   Chloride 101  96 - 112 (mEq/L)   BUN 4 (*) 6 - 23 (mg/dL)   Creatinine, Ser 4.09  0.50 - 1.10 (mg/dL)   Glucose, Bld 811 (*) 70 - 99 (mg/dL)   Calcium, Ion 9.14  7.82 - 1.32 (mmol/L)   TCO2 28  0 - 100 (mmol/L)   Hemoglobin 12.9  12.0 - 15.0 (g/dL)   HCT 95.6  21.3 - 08.6 (%)   Ct Abdomen Pelvis W Contrast  06/01/2011  *RADIOLOGY REPORT*  Clinical Data: Left lower quadrant and flank pain.  CT ABDOMEN AND PELVIS WITH CONTRAST  Technique:  Multidetector CT imaging of the abdomen and pelvis was performed following the standard protocol during bolus administration of intravenous contrast.  Contrast: OMNIPAQUE IOHEXOL 300 MG/ML  SOLN  Comparison: 11/08/2010  Findings: No focal abnormalities seen in the liver or spleen.  The stomach, duodenum, pancreas, gallbladder, adrenal glands, and kidneys are unremarkable.  No abdominal aortic aneurysm.  No free fluid or  lymphadenopathy in the abdomen.  Small umbilical hernia contains only fat.  Imaging through the pelvis shows a trace amount of intraperitoneal free fluid.  There is no pelvic sidewall lymphadenopathy.  Bladder is unremarkable.  Uterus has normal imaging features.  There is no adnexal mass.  The short segmental wall thickening is identified in the proximal sigmoid colon.  This is associated with pericolonic edema/inflammation in several lymph nodes within the sigmoid mesocolon.  These changes are superimposed on a background of colonic diverticulitis.  The terminal ileum is normal. Nonvisualization of the appendix is consistent with the reported history of appendectomy.  Bone windows reveal no worrisome lytic or sclerotic osseous lesions.  Wedge deformity of the L1 vertebral body is stable.  IMPRESSION: Short segment of wall thickening with pericolonic edema/inflammation in the proximal sigmoid colon.  Imaging features are most likely related to diverticulitis without perforation or abscess.  Close follow-up is recommended as colonic neoplasm can present with similar imaging features.  Original Report Authenticated By: ERIC A. MANSELL, M.D.     No diagnosis found.  12:55 PM patient states pain is 9 on scale 1-10 however she declines further pain medicine. She is requesting additional medicine for nausea, Zofran ordered   MDM  Spoke with Dr.Hongalgi. Plan admit medical surgical floor clear liquid diet IV hydration symptomatic control intravenous antibiotics Diagnosis acute diverticulitis        Doug Sou, MD 06/02/11 1342

## 2011-06-03 LAB — CBC
HCT: 33.4 % — ABNORMAL LOW (ref 36.0–46.0)
Hemoglobin: 10.9 g/dL — ABNORMAL LOW (ref 12.0–15.0)
RDW: 13.5 % (ref 11.5–15.5)
WBC: 8.6 10*3/uL (ref 4.0–10.5)

## 2011-06-03 LAB — BASIC METABOLIC PANEL
Chloride: 101 mEq/L (ref 96–112)
GFR calc Af Amer: >90 mL/min (ref >90)
Potassium: 3.4 mEq/L — ABNORMAL LOW (ref 3.5–5.1)

## 2011-06-03 NOTE — Progress Notes (Signed)
Subjective: Not eating much. Still c/o LLQ abdominal pain.  Objective: Vital signs in last 24 hours: Temp:  [97.4 F (36.3 C)-98.5 F (36.9 C)] 97.4 F (36.3 C) (05/08 0445) Pulse Rate:  [67-99] 67  (05/08 0445) Resp:  [13-21] 15  (05/08 0445) BP: (123-141)/(66-86) 130/83 mmHg (05/08 0445) SpO2:  [96 %-99 %] 98 % (05/08 0445) Weight change:  Last BM Date: 05/30/11  Intake/Output from previous day: 05/07 0701 - 05/08 0700 In: 2900 [P.O.:700; I.V.:1797; IV Piggyback:403] Out: 350 [Emesis/NG output:350]     Physical Exam: General: Alert, awake, oriented x3, in no acute distress. HEENT: No bruits, no goiter. Heart: Regular rate and rhythm, without murmurs, rubs, gallops. Lungs: Clear to auscultation bilaterally. Abdomen: Soft, TTP LLQ, nondistended, positive bowel sounds. Extremities: No clubbing cyanosis or edema with positive pedal pulses. Neuro: Grossly intact, nonfocal.    Lab Results: Basic Metabolic Panel:  Basename 06/03/11 0359 06/02/11 1238 06/01/11 2100  NA 134* 139 --  K 3.4* 3.8 --  CL 101 101 --  CO2 24 -- 23  GLUCOSE 102* 120* --  BUN 6 4* --  CREATININE 0.72 0.70 --  CALCIUM 8.6 -- 8.7  MG -- -- --  PHOS -- -- --   CBC:  Basename 06/03/11 0359 06/02/11 1238 06/02/11 1155  WBC 8.6 -- 10.2  NEUTROABS -- -- --  HGB 10.9* 12.9 --  HCT 33.4* 38.0 --  MCV 85.6 -- 84.7  PLT 267 -- 286   Urinalysis:  Basename 06/01/11 2022  COLORURINE YELLOW  LABSPEC 1.022  PHURINE 6.0  GLUCOSEU NEGATIVE  HGBUR NEGATIVE  BILIRUBINUR NEGATIVE  KETONESUR NEGATIVE  PROTEINUR NEGATIVE  UROBILINOGEN 1.0  NITRITE NEGATIVE  LEUKOCYTESUR NEGATIVE    Studies/Results: Ct Abdomen Pelvis W Contrast  06/01/2011  *RADIOLOGY REPORT*  Clinical Data: Left lower quadrant and flank pain.  CT ABDOMEN AND PELVIS WITH CONTRAST  Technique:  Multidetector CT imaging of the abdomen and pelvis was performed following the standard protocol during bolus administration of intravenous  contrast.  Contrast: OMNIPAQUE IOHEXOL 300 MG/ML  SOLN  Comparison: 11/08/2010  Findings: No focal abnormalities seen in the liver or spleen.  The stomach, duodenum, pancreas, gallbladder, adrenal glands, and kidneys are unremarkable.  No abdominal aortic aneurysm.  No free fluid or lymphadenopathy in the abdomen.  Small umbilical hernia contains only fat.  Imaging through the pelvis shows a trace amount of intraperitoneal free fluid.  There is no pelvic sidewall lymphadenopathy.  Bladder is unremarkable.  Uterus has normal imaging features.  There is no adnexal mass.  The short segmental wall thickening is identified in the proximal sigmoid colon.  This is associated with pericolonic edema/inflammation in several lymph nodes within the sigmoid mesocolon.  These changes are superimposed on a background of colonic diverticulitis.  The terminal ileum is normal. Nonvisualization of the appendix is consistent with the reported history of appendectomy.  Bone windows reveal no worrisome lytic or sclerotic osseous lesions.  Wedge deformity of the L1 vertebral body is stable.  IMPRESSION: Short segment of wall thickening with pericolonic edema/inflammation in the proximal sigmoid colon.  Imaging features are most likely related to diverticulitis without perforation or abscess.  Close follow-up is recommended as colonic neoplasm can present with similar imaging features.  Original Report Authenticated By: ERIC A. MANSELL, M.D.    Medications: Scheduled Meds:   . amLODipine  10 mg Oral Daily  . ciprofloxacin  400 mg Intravenous Once  . ciprofloxacin  400 mg Intravenous Q12H  .  docusate sodium  100 mg Oral BID  .  HYDROmorphone (DILAUDID) injection  2 mg Intravenous Once  . metronidazole  500 mg Intravenous Once  . metronidazole  500 mg Intravenous Q8H  . ondansetron  4 mg Intravenous Once  . pantoprazole (PROTONIX) IV  40 mg Intravenous QHS  . DISCONTD: sodium chloride   Intravenous STAT   Continuous  Infusions:   . 0.9 % NaCl with KCl 20 mEq / L 1,000 mL (06/03/11 0519)   PRN Meds:.acetaminophen, acetaminophen, albuterol, alum & mag hydroxide-simeth, HYDROcodone-acetaminophen, HYDROmorphone (DILAUDID) injection, ondansetron (ZOFRAN) IV, ondansetron, senna, DISCONTD:  HYDROmorphone (DILAUDID) injection, DISCONTD: ondansetron (ZOFRAN) IV  Assessment/Plan:  Principal Problem:  *Diverticulitis of sigmoid colon Active Problems:  CONSTIPATION, CHRONIC  GERD (gastroesophageal reflux disease)  Nausea and vomiting  Otitis  HTN (hypertension)   #1 Sigmoid Diverticulitis: Continue cipro/flagyl. Advance diet as tolerated. Pain meds as needed.  #2 Right otitis media: Cipro should cover. F/U with ENT outpatient after DC.  #3 Dispo: Once tolerating food with minimal pain, may DC home.   LOS: 1 day   [facility] Pager: [phone] 06/03/2011, 12:44 PM

## 2011-06-04 ENCOUNTER — Inpatient Hospital Stay (HOSPITAL_COMMUNITY): Payer: Medicaid Other

## 2011-06-04 DIAGNOSIS — I10 Essential (primary) hypertension: Secondary | ICD-10-CM

## 2011-06-04 DIAGNOSIS — E860 Dehydration: Secondary | ICD-10-CM

## 2011-06-04 DIAGNOSIS — K5732 Diverticulitis of large intestine without perforation or abscess without bleeding: Secondary | ICD-10-CM

## 2011-06-04 DIAGNOSIS — R112 Nausea with vomiting, unspecified: Secondary | ICD-10-CM

## 2011-06-04 LAB — BASIC METABOLIC PANEL
BUN: 4 mg/dL — ABNORMAL LOW (ref 6–23)
Chloride: 100 mEq/L (ref 96–112)
GFR calc Af Amer: >90 mL/min (ref >90)
Glucose, Bld: 91 mg/dL (ref 70–99)
Potassium: 3.6 mEq/L (ref 3.5–5.1)
Sodium: 135 mEq/L (ref 135–145)

## 2011-06-04 LAB — CBC
HCT: 34.3 % — ABNORMAL LOW (ref 36.0–46.0)
Hemoglobin: 11.2 g/dL — ABNORMAL LOW (ref 12.0–15.0)
MCHC: 32.7 g/dL (ref 30.0–36.0)
RBC: 4.08 MIL/uL (ref 3.87–5.11)

## 2011-06-04 MED ORDER — MORPHINE SULFATE 2 MG/ML IJ SOLN
2.0000 mg | INTRAMUSCULAR | Status: DC | PRN
Start: 2011-06-04 — End: 2011-06-07
  Administered 2011-06-04 – 2011-06-05 (×4): 2 mg via INTRAVENOUS
  Filled 2011-06-04 (×3): qty 1

## 2011-06-04 MED ORDER — MORPHINE SULFATE 2 MG/ML IJ SOLN
INTRAMUSCULAR | Status: AC
  Filled 2011-06-04: qty 1

## 2011-06-04 NOTE — Progress Notes (Signed)
Subjective: Still complaining of significant left lower quadrant pain, I have observed her walking around her bedroom and she is hunched over because of the pain. She has not eaten anything, not even liquids, since her admission.  Objective: Vital signs in last 24 hours: Temp:  [97.7 F (36.5 C)-98.7 F (37.1 C)] 98.1 F (36.7 C) (05/09 0430) Pulse Rate:  [65-84] 72  (05/09 0430) Resp:  [15-17] 15  (05/09 0430) BP: (122-136)/(62-88) 127/81 mmHg (05/09 0430) SpO2:  [96 %-98 %] 96 % (05/09 0430) Weight change:  Last BM Date: 05/30/11  Intake/Output from previous day: 05/08 0701 - 05/09 0700 In: 2465.4 [P.O.:480; I.V.:1585.4; IV Piggyback:400] Out: -  Total I/O In: 100 [P.O.:100] Out: -    Physical Exam: General: Alert, awake, oriented x3,. HEENT: No bruits, no goiter. Heart: Regular rate and rhythm, without murmurs, rubs, gallops. Lungs: Clear to auscultation bilaterally. Abdomen: Soft, exquisitely tender to palpation of the left lower quadrant with voluntary guarding and rebound. Extremities: No clubbing cyanosis or edema with positive pedal pulses. Neuro: Grossly intact, nonfocal.    Lab Results: Basic Metabolic Panel:  Basename 06/04/11 0340 06/03/11 0359  NA 135 134*  K 3.6 3.4*  CL 100 101  CO2 25 24  GLUCOSE 91 102*  BUN 4* 6  CREATININE 0.70 0.72  CALCIUM 8.5 8.6  MG -- --  PHOS -- --   CBC:  Basename 06/04/11 0340 06/03/11 0359  WBC 5.9 8.6  NEUTROABS -- --  HGB 11.2* 10.9*  HCT 34.3* 33.4*  MCV 84.1 85.6  PLT 244 267   Urinalysis:  Basename 06/01/11 2022  COLORURINE YELLOW  LABSPEC 1.022  PHURINE 6.0  GLUCOSEU NEGATIVE  HGBUR NEGATIVE  BILIRUBINUR NEGATIVE  KETONESUR NEGATIVE  PROTEINUR NEGATIVE  UROBILINOGEN 1.0  NITRITE NEGATIVE  LEUKOCYTESUR NEGATIVE    Studies/Results: No results found.  Medications: Scheduled Meds:   . amLODipine  10 mg Oral Daily  . ciprofloxacin  400 mg Intravenous Q12H  . docusate sodium  100 mg Oral  BID  . metronidazole  500 mg Intravenous Q8H  . pantoprazole (PROTONIX) IV  40 mg Intravenous QHS   Continuous Infusions:   . 0.9 % NaCl with KCl 20 mEq / L 125 mL/hr at 06/04/11 0419   PRN Meds:.acetaminophen, acetaminophen, albuterol, alum & mag hydroxide-simeth, HYDROcodone-acetaminophen, HYDROmorphone (DILAUDID) injection, ondansetron (ZOFRAN) IV, ondansetron, senna  Assessment/Plan:  Principal Problem:  *Diverticulitis of sigmoid colon Active Problems:  CONSTIPATION, CHRONIC  GERD (gastroesophageal reflux disease)  Nausea and vomiting  Otitis  HTN (hypertension)   #1 sigmoid diverticulitis: She has been on IV Cipro and Flagyl since admission. She has been frequently using nausea and pain medications and has been unable to tolerate anything by mouth since admission. Today she states that her pain has increased, with her significant rebound, guarding and left quadrant pain, I think it is prudent to repeat a CT abdomen pelvis to make sure that she has not had a perforation of her diverticulitis.  #2 right otitis media: Cipro should cover. She knows to followup with ENT as an outpatient following discharge.  #3 disposition: Not medically ready for discharge.   LOS: 2 days   [facility] Pager: [phone] 06/04/2011, 1:09 PM

## 2011-06-05 DIAGNOSIS — I10 Essential (primary) hypertension: Secondary | ICD-10-CM

## 2011-06-05 DIAGNOSIS — E860 Dehydration: Secondary | ICD-10-CM

## 2011-06-05 DIAGNOSIS — K5732 Diverticulitis of large intestine without perforation or abscess without bleeding: Secondary | ICD-10-CM

## 2011-06-05 DIAGNOSIS — R112 Nausea with vomiting, unspecified: Secondary | ICD-10-CM

## 2011-06-05 LAB — CBC
HCT: 36 % (ref 36.0–46.0)
Hemoglobin: 12 g/dL (ref 12.0–15.0)
MCH: 28.1 pg (ref 26.0–34.0)
MCHC: 33.3 g/dL (ref 30.0–36.0)
MCV: 84.3 fL (ref 78.0–100.0)
RBC: 4.27 MIL/uL (ref 3.87–5.11)

## 2011-06-05 LAB — BASIC METABOLIC PANEL
BUN: 6 mg/dL (ref 6–23)
CO2: 25 mEq/L (ref 19–32)
GFR calc non Af Amer: >90 mL/min (ref >90)
Glucose, Bld: 95 mg/dL (ref 70–99)
Potassium: 3.8 mEq/L (ref 3.5–5.1)

## 2011-06-05 MED ORDER — ZOLPIDEM TARTRATE 5 MG PO TABS
5.0000 mg | ORAL_TABLET | Freq: Every evening | ORAL | Status: DC | PRN
  Administered 2011-06-06 (×2): 5 mg via ORAL
  Filled 2011-06-05 (×2): qty 1

## 2011-06-05 NOTE — Progress Notes (Deleted)
Pt vomited about 20ml of yellowish-green liquid emesis. Pt has mostly been dry heaving and spitting up clear liquids, however. Pt instructed to save any subsequent vomitus for the nurse to see and document.  

## 2011-06-05 NOTE — Progress Notes (Signed)
Subjective: States pain and nausea is improved. Wants to advance diet today.  Objective: Vital signs in last 24 hours: Temp:  [97.7 F (36.5 C)-98.6 F (37 C)] 98.3 F (36.8 C) (05/10 0615) Pulse Rate:  [69-77] 77  (05/10 0615) Resp:  [15-18] 15  (05/10 0615) BP: (126-151)/(71-101) 135/83 mmHg (05/10 0615) SpO2:  [97 %-99 %] 97 % (05/10 0615) Weight change:  Last BM Date: 05/30/11  Intake/Output from previous day: 05/09 0701 - 05/10 0700 In: 6046.2 [P.O.:580; I.V.:4766.2; IV Piggyback:700] Out: -      Physical Exam: General: Alert, awake, oriented x3, in no acute distress. HEENT: No bruits, no goiter. Heart: Regular rate and rhythm, without murmurs, rubs, gallops. Lungs: Clear to auscultation bilaterally. Abdomen: Soft, tender to deep palpation of left lower quadrant, nondistended, positive bowel sounds. Extremities: No clubbing cyanosis or edema with positive pedal pulses. Neuro: Grossly intact, nonfocal.    Lab Results: Basic Metabolic Panel:  Basename 06/05/11 0655 06/04/11 0340  NA 132* 135  K 3.8 3.6  CL 99 100  CO2 25 25  GLUCOSE 95 91  BUN 6 4*  CREATININE 0.70 0.70  CALCIUM 8.6 8.5  MG -- --  PHOS -- --   CBC:  Basename 06/05/11 0655 06/04/11 0340  WBC 5.6 5.9  NEUTROABS -- --  HGB 12.0 11.2*  HCT 36.0 34.3*  MCV 84.3 84.1  PLT 277 244    Studies/Results: Ct Abdomen Pelvis Wo Contrast  06/04/2011  *RADIOLOGY REPORT*  Clinical Data: Left lower quadrant pain with nausea and vomiting  CT ABDOMEN AND PELVIS WITHOUT CONTRAST  Technique:  Multidetector CT imaging of the abdomen and pelvis was performed following the standard protocol without intravenous contrast.  Comparison: Jun 01, 2011  Findings: The lung bases are clear.  There is a small hiatal hernia.  Vicarious excretion of contrast material within the gallbladder is noted from recent prior CT scan.  The liver, spleen, pancreas, adrenal glands, kidneys, urinary bladder, uterus and adnexa have an  unremarkable noncontrasted appearance. There is a small umbilical hernia containing fat only.  L1 anterior compression is stable.  Again noted is a thick-walled segment of sigmoid colon which appears slightly improved compared with the recent prior study. There is mild persistent pericolonic stranding and a small amount of adjacent free fluid, as well as free fluid within the cul-de- sac.  There is no evidence of pneumoperitoneum or abscess. Diverticulosis is noted involving the remainder of the left colon.  IMPRESSION: Slight interval improvement in appearance of sigmoid diverticulitis.  No pneumoperitoneum or abscess.  Original Report Authenticated By: Brandon Melnick, M.D.    Medications: Scheduled Meds:   . amLODipine  10 mg Oral Daily  . ciprofloxacin  400 mg Intravenous Q12H  . docusate sodium  100 mg Oral BID  . metronidazole  500 mg Intravenous Q8H  . morphine      . pantoprazole (PROTONIX) IV  40 mg Intravenous QHS   Continuous Infusions:   . 0.9 % NaCl with KCl 20 mEq / L 125 mL/hr at 06/05/11 0409   PRN Meds:.acetaminophen, acetaminophen, albuterol, alum & mag hydroxide-simeth, HYDROcodone-acetaminophen, morphine injection, ondansetron (ZOFRAN) IV, ondansetron, senna, DISCONTD:  HYDROmorphone (DILAUDID) injection  Assessment/Plan:  Principal Problem:  *Diverticulitis of sigmoid colon Active Problems:  CONSTIPATION, CHRONIC  GERD (gastroesophageal reflux disease)  Nausea and vomiting  Otitis  HTN (hypertension)   #1 sigmoid diverticulitis: Continue IV Cipro and Flagyl. Repeat CT scan shows actual improvement. I have explained to patient that there  is no reason why she should not start to feel better. We'll advance her diet. If she tolerates diet we'll transition her antibiotics to the by mouth route and anticipate discharge in the next 24-48 hours.  #2 right otitis media: Cipro should be sufficient coverage. She also followup with ENT as an outpatient following  discharge.  #3 disposition: Anticipate discharge in 24-48 hours.   LOS: 3 days   HERNANDEZ ACOSTA,[place] Deleo [facility] Pager: [phone] 06/05/2011, 2:06 PM

## 2011-06-05 NOTE — Progress Notes (Signed)
Pt vomited about 20ml of yellowish-green liquid emesis. Pt has mostly been dry heaving and spitting up clear liquids, however. Pt instructed to save any subsequent vomitus for the nurse to see and document.

## 2011-06-06 DIAGNOSIS — K5732 Diverticulitis of large intestine without perforation or abscess without bleeding: Secondary | ICD-10-CM

## 2011-06-06 DIAGNOSIS — I10 Essential (primary) hypertension: Secondary | ICD-10-CM

## 2011-06-06 DIAGNOSIS — E860 Dehydration: Secondary | ICD-10-CM

## 2011-06-06 DIAGNOSIS — R112 Nausea with vomiting, unspecified: Secondary | ICD-10-CM

## 2011-06-06 LAB — BASIC METABOLIC PANEL
BUN: 7 mg/dL (ref 6–23)
CO2: 26 mEq/L (ref 19–32)
Calcium: 8.5 mg/dL (ref 8.4–10.5)
Creatinine, Ser: 0.79 mg/dL (ref 0.50–1.10)
GFR calc non Af Amer: >90 mL/min (ref >90)
Glucose, Bld: 110 mg/dL — ABNORMAL HIGH (ref 70–99)
Sodium: 136 mEq/L (ref 135–145)

## 2011-06-06 LAB — CBC
MCH: 28.1 pg (ref 26.0–34.0)
MCHC: 33.7 g/dL (ref 30.0–36.0)
MCV: 83.4 fL (ref 78.0–100.0)
Platelets: 288 10*3/uL (ref 150–400)
RDW: 13.2 % (ref 11.5–15.5)

## 2011-06-06 MED ORDER — CIPROFLOXACIN HCL 500 MG PO TABS
500.0000 mg | ORAL_TABLET | Freq: Two times a day (BID) | ORAL | Status: DC
  Administered 2011-06-06 – 2011-06-07 (×2): 500 mg via ORAL
  Filled 2011-06-06 (×5): qty 1

## 2011-06-06 MED ORDER — METRONIDAZOLE 500 MG PO TABS
500.0000 mg | ORAL_TABLET | Freq: Three times a day (TID) | ORAL | Status: DC
  Administered 2011-06-06 – 2011-06-07 (×2): 500 mg via ORAL
  Filled 2011-06-06 (×6): qty 1

## 2011-06-06 NOTE — Progress Notes (Signed)
Subjective: Feels a little better. Less abdominal pain, ate some cereal this morning.  Objective: Vital signs in last 24 hours: Temp:  [97.6 F (36.4 C)-98.3 F (36.8 C)] 97.6 F (36.4 C) (05/11 0600) Pulse Rate:  [68-79] 68  (05/11 0600) Resp:  [14-18] 14  (05/11 0600) BP: (109-139)/(70-82) 109/70 mmHg (05/11 0915) SpO2:  [95 %-98 %] 96 % (05/11 0600) Weight change:  Last BM Date: 05/30/11  Intake/Output from previous day: 05/10 0701 - 05/11 0700 In: 2645 [P.O.:720; I.V.:1125; IV Piggyback:800] Out: -      Physical Exam: General: Alert, awake, oriented x3, in no acute distress. HEENT: No bruits, no goiter. Heart: Regular rate and rhythm, without murmurs, rubs, gallops. Lungs: Clear to auscultation bilaterally. Abdomen: Soft, tender to palpation of left lower quadrant., nondistended, positive bowel sounds. Extremities: No clubbing cyanosis or edema with positive pedal pulses. Neuro: Grossly intact, nonfocal.    Lab Results: Basic Metabolic Panel:  Basename 06/06/11 0405 06/05/11 0655  NA 136 132*  K 3.5 3.8  CL 101 99  CO2 26 25  GLUCOSE 110* 95  BUN 7 6  CREATININE 0.79 0.70  CALCIUM 8.5 8.6  MG -- --  PHOS -- --   CBC:  Basename 06/06/11 0405 06/05/11 0655  WBC 6.6 5.6  NEUTROABS -- --  HGB 12.5 12.0  HCT 37.1 36.0  MCV 83.4 84.3  PLT 288 277    Studies/Results: Ct Abdomen Pelvis Wo Contrast  06/04/2011  *RADIOLOGY REPORT*  Clinical Data: Left lower quadrant pain with nausea and vomiting  CT ABDOMEN AND PELVIS WITHOUT CONTRAST  Technique:  Multidetector CT imaging of the abdomen and pelvis was performed following the standard protocol without intravenous contrast.  Comparison: Jun 01, 2011  Findings: The lung bases are clear.  There is a small hiatal hernia.  Vicarious excretion of contrast material within the gallbladder is noted from recent prior CT scan.  The liver, spleen, pancreas, adrenal glands, kidneys, urinary bladder, uterus and adnexa have an  unremarkable noncontrasted appearance. There is a small umbilical hernia containing fat only.  L1 anterior compression is stable.  Again noted is a thick-walled segment of sigmoid colon which appears slightly improved compared with the recent prior study. There is mild persistent pericolonic stranding and a small amount of adjacent free fluid, as well as free fluid within the cul-de- sac.  There is no evidence of pneumoperitoneum or abscess. Diverticulosis is noted involving the remainder of the left colon.  IMPRESSION: Slight interval improvement in appearance of sigmoid diverticulitis.  No pneumoperitoneum or abscess.  Original Report Authenticated By: Brandon Melnick, M.D.    Medications: Scheduled Meds:   . amLODipine  10 mg Oral Daily  . ciprofloxacin  400 mg Intravenous Q12H  . docusate sodium  100 mg Oral BID  . metronidazole  500 mg Intravenous Q8H  . pantoprazole (PROTONIX) IV  40 mg Intravenous QHS   Continuous Infusions:   . 0.9 % NaCl with KCl 20 mEq / L 125 mL/hr at 06/06/11 0452   PRN Meds:.acetaminophen, acetaminophen, albuterol, alum & mag hydroxide-simeth, HYDROcodone-acetaminophen, morphine injection, ondansetron (ZOFRAN) IV, ondansetron, senna, zolpidem  Assessment/Plan:  Principal Problem:  *Diverticulitis of sigmoid colon Active Problems:  CONSTIPATION, CHRONIC  GERD (gastroesophageal reflux disease)  Nausea and vomiting  Otitis  HTN (hypertension)   #1 sigmoid diverticulitis: Change Cipro Flagyl to the by mouth route. Advance diet as tolerated. Likely discharge home in the morning.  #2 right otitis media: Cipro should be sufficient coverage. Patient  also followup with ENT as an outpatient following discharge.   LOS: 4 days   [facility] Pager: [phone] 06/06/2011, 12:59 PM

## 2011-06-07 DIAGNOSIS — R112 Nausea with vomiting, unspecified: Secondary | ICD-10-CM

## 2011-06-07 DIAGNOSIS — K5732 Diverticulitis of large intestine without perforation or abscess without bleeding: Secondary | ICD-10-CM

## 2011-06-07 DIAGNOSIS — E860 Dehydration: Secondary | ICD-10-CM

## 2011-06-07 DIAGNOSIS — I10 Essential (primary) hypertension: Secondary | ICD-10-CM

## 2011-06-07 LAB — CBC
MCH: 27.8 pg (ref 26.0–34.0)
MCV: 83.8 fL (ref 78.0–100.0)
Platelets: 314 10*3/uL (ref 150–400)
RBC: 4.25 MIL/uL (ref 3.87–5.11)
RDW: 13.5 % (ref 11.5–15.5)
WBC: 6 10*3/uL (ref 4.0–10.5)

## 2011-06-07 LAB — BASIC METABOLIC PANEL
CO2: 24 mEq/L (ref 19–32)
Calcium: 8.6 mg/dL (ref 8.4–10.5)
Creatinine, Ser: 0.72 mg/dL (ref 0.50–1.10)
GFR calc non Af Amer: >90 mL/min (ref >90)
Sodium: 134 mEq/L — ABNORMAL LOW (ref 135–145)

## 2011-06-07 MED ORDER — METRONIDAZOLE 500 MG PO TABS: 500.0000 mg | ORAL_TABLET | Freq: Three times a day (TID) | ORAL | Status: AC

## 2011-06-07 MED ORDER — CIPROFLOXACIN HCL 500 MG PO TABS: 500.0000 mg | ORAL_TABLET | Freq: Two times a day (BID) | ORAL | Status: AC

## 2011-06-07 MED ORDER — HYDROCODONE-ACETAMINOPHEN 5-325 MG PO TABS: 1.0000 | ORAL_TABLET | ORAL | Status: AC | PRN

## 2011-06-07 MED ORDER — ONDANSETRON HCL 4 MG PO TABS: 4.0000 mg | ORAL_TABLET | Freq: Four times a day (QID) | ORAL | Status: AC | PRN

## 2011-06-07 MED ORDER — PANTOPRAZOLE SODIUM 40 MG PO TBEC: 40.0000 mg | DELAYED_RELEASE_TABLET | Freq: Every day | ORAL | Status: DC

## 2011-06-07 NOTE — Progress Notes (Signed)
Discharge teaching completed. Handouts given and reviewed on Diverticulitis, Zofran, Norco, Cipro, Flagyl. Told to call to schedule follow up appointment. Prescriptions given for Cipr, Flagyl, Norco, and Zofran. Reviewed MD discharge instructions. Answered questions. Family here for discharge.

## 2011-06-07 NOTE — Progress Notes (Signed)
Pt. Left via ambulation with family, no resp. Distress noted.

## 2011-06-07 NOTE — Progress Notes (Signed)
This patient is receiving IV Protonix. Based on criteria approved by the Pharmacy and Therapeutics Committee, this medication is being converted to the equivalent oral dose form. These criteria include: . The patient is eating (either orally or per tube) and/or has been taking other orally administered medications for at least 24 hours. . This patient has no evidence of active gastrointestinal bleeding or impaired GI absorption (gastrectomy, short bowel, patient on TNA or NPO).  If you have questions about this conversion, please contact the pharmacy department. Thank you.  Clance Boll, PharmD, BCPS Pager: [phone] 06/07/2011 10:42 AM

## 2011-06-07 NOTE — Discharge Summary (Signed)
Physician Discharge Summary  Patient ID: Briana Wade MRN: [id number] DOB/AGE: 10/21/1970 41 y.o.  Admit date: 06/02/2011 Discharge date: 06/07/2011  Primary Care Physician:  No primary provider on file.   Discharge Diagnoses:    Principal Problem:  *Diverticulitis of sigmoid colon Active Problems:  CONSTIPATION, CHRONIC  GERD (gastroesophageal reflux disease)  Nausea and vomiting  Otitis  HTN (hypertension)    Medication List  As of 06/07/2011 12:12 PM   STOP taking these medications         ondansetron 8 MG disintegrating tablet      oxyCODONE-acetaminophen 5-325 MG per tablet         TAKE these medications         amLODipine 10 MG tablet   Commonly known as: NORVASC   Take 10 mg by mouth daily.      ciprofloxacin 500 MG tablet   Commonly known as: CIPRO   Take 1 tablet (500 mg total) by mouth 2 (two) times daily.      esomeprazole 40 MG capsule   Commonly known as: NEXIUM   Take 40 mg by mouth daily before breakfast.      HYDROcodone-acetaminophen 5-325 MG per tablet   Commonly known as: NORCO   Take 1-2 tablets by mouth every 4 (four) hours as needed.      ibuprofen 600 MG tablet   Commonly known as: ADVIL,MOTRIN   Take 600 mg by mouth every 6 (six) hours as needed. Pain      metroNIDAZOLE 500 MG tablet   Commonly known as: FLAGYL   Take 1 tablet (500 mg total) by mouth every 8 (eight) hours.      ondansetron 4 MG tablet   Commonly known as: ZOFRAN   Take 1 tablet (4 mg total) by mouth every 6 (six) hours as needed for nausea.             Disposition and Follow-up:  Patient will be discharged home today in stable and improved condition. Will need to followup with her primary care physician in 2-3 weeks.  Consults:  None    Significant Diagnostic Studies:   Ct Abdomen Pelvis Wo Contrast  06/04/2011  *RADIOLOGY REPORT*  Clinical Data: Left lower quadrant pain with nausea and vomiting  CT ABDOMEN AND PELVIS WITHOUT CONTRAST  Technique:   Multidetector CT imaging of the abdomen and pelvis was performed following the standard protocol without intravenous contrast.  Comparison: Jun 01, 2011  Findings: The lung bases are clear.  There is a small hiatal hernia.  Vicarious excretion of contrast material within the gallbladder is noted from recent prior CT scan.  The liver, spleen, pancreas, adrenal glands, kidneys, urinary bladder, uterus and adnexa have an unremarkable noncontrasted appearance. There is a small umbilical hernia containing fat only.  L1 anterior compression is stable.  Again noted is a thick-walled segment of sigmoid colon which appears slightly improved compared with the recent prior study. There is mild persistent pericolonic stranding and a small amount of adjacent free fluid, as well as free fluid within the cul-de- sac.  There is no evidence of pneumoperitoneum or abscess. Diverticulosis is noted involving the remainder of the left colon.  IMPRESSION: Slight interval improvement in appearance of sigmoid diverticulitis.  No pneumoperitoneum or abscess.  Original Report Authenticated By: Brandon Melnick, M.D.   Ct Abdomen Pelvis W Contrast  06/01/2011  *RADIOLOGY REPORT*  Clinical Data: Left lower quadrant and flank pain.  CT ABDOMEN AND PELVIS WITH CONTRAST  Technique:  Multidetector CT imaging of the abdomen and pelvis was performed following the standard protocol during bolus administration of intravenous contrast.  Contrast: OMNIPAQUE IOHEXOL 300 MG/ML  SOLN  Comparison: 11/08/2010  Findings: No focal abnormalities seen in the liver or spleen.  The stomach, duodenum, pancreas, gallbladder, adrenal glands, and kidneys are unremarkable.  No abdominal aortic aneurysm.  No free fluid or lymphadenopathy in the abdomen.  Small umbilical hernia contains only fat.  Imaging through the pelvis shows a trace amount of intraperitoneal free fluid.  There is no pelvic sidewall lymphadenopathy.  Bladder is unremarkable.  Uterus has normal  imaging features.  There is no adnexal mass.  The short segmental wall thickening is identified in the proximal sigmoid colon.  This is associated with pericolonic edema/inflammation in several lymph nodes within the sigmoid mesocolon.  These changes are superimposed on a background of colonic diverticulitis.  The terminal ileum is normal. Nonvisualization of the appendix is consistent with the reported history of appendectomy.  Bone windows reveal no worrisome lytic or sclerotic osseous lesions.  Wedge deformity of the L1 vertebral body is stable.  IMPRESSION: Short segment of wall thickening with pericolonic edema/inflammation in the proximal sigmoid colon.  Imaging features are most likely related to diverticulitis without perforation or abscess.  Close follow-up is recommended as colonic neoplasm can present with similar imaging features.  Original Report Authenticated By: ERIC A. MANSELL, M.D.   Brief H and P: For complete details please refer to admission H and P, but in brief patient is a 41 year old African American female patient with history of hypertension, diverticulitis in September of 2012, constipation, irritable bowel syndrome who presents to the emergency department with abdominal pain, nausea and vomiting. She indicates that her symptoms first started approximately 2 weeks ago with intermittent left lower quadrant abdominal pain which was mild and rates it at 4/10. However in the last 3 days the symptoms have gotten worse with pain now 10/10 in severity, nonradiating, associated with fevers (temperature not checked) and chills and intractable nausea and vomiting. Vomitus consists of yellowish clear liquids without any blood or coffee grounds. She is constipated and her last BM was 2-3 days ago. She is passing flatus. She presented to the emergency department with these symptoms on 5/6. Her CT revealed sigmoid diverticulitis. She was discharged home on oral Cipro, Flagyl, pain medications and  nausea medications. She however was unable to fill her antibiotics. Today she returns with worsening/persistent above symptoms. The hospitalist service was requested to admit for further evaluation and management.     Hospital Course:  Principal Problem:  *Diverticulitis of sigmoid colon Active Problems:  CONSTIPATION, CHRONIC  GERD (gastroesophageal reflux disease)  Nausea and vomiting  Otitis  HTN (hypertension)   #1 sigmoid colon diverticulitis: Pain improved, tolerating diet. Will be discharged with a three-week course of Cipro and Flagyl. I have also provided her with prescriptions for Vicodin and Zofran. Is instructed to followup with her primary care physician in 2-3 weeks.  #2 right otitis media: Cipro should be sufficient coverage. Patient has been advised to secure ENT followup upon discharge.  Rest of chronic conditions have been stable this hospitalization and her home medications have not been altered.    Time spent on Discharge: Greater than 30 minutes.  SignedChaya Jan [facility] Pager: [phone] 06/07/2011, 12:12 PM

## 2011-12-28 ENCOUNTER — Emergency Department (HOSPITAL_COMMUNITY)
Admission: EM | Admit: 2011-12-28 | Discharge: 2011-12-28 | Disposition: A | Discharge status: Home / Self Care | Payer: Medicaid Other | Attending: Emergency Medicine | Admitting: Emergency Medicine

## 2011-12-28 ENCOUNTER — Emergency Department (HOSPITAL_COMMUNITY): Payer: Medicaid Other

## 2011-12-28 DIAGNOSIS — R0602 Shortness of breath: Secondary | ICD-10-CM | POA: Insufficient documentation

## 2011-12-28 DIAGNOSIS — J3489 Other specified disorders of nose and nasal sinuses: Secondary | ICD-10-CM | POA: Insufficient documentation

## 2011-12-28 DIAGNOSIS — IMO0001 Reserved for inherently not codable concepts without codable children: Secondary | ICD-10-CM | POA: Insufficient documentation

## 2011-12-28 DIAGNOSIS — J069 Acute upper respiratory infection, unspecified: Secondary | ICD-10-CM | POA: Insufficient documentation

## 2011-12-28 DIAGNOSIS — Z8669 Personal history of other diseases of the nervous system and sense organs: Secondary | ICD-10-CM | POA: Insufficient documentation

## 2011-12-28 DIAGNOSIS — R079 Chest pain, unspecified: Secondary | ICD-10-CM | POA: Insufficient documentation

## 2011-12-28 DIAGNOSIS — Z8619 Personal history of other infectious and parasitic diseases: Secondary | ICD-10-CM | POA: Insufficient documentation

## 2011-12-28 DIAGNOSIS — Z860100 Personal history of colon polyps, unspecified: Secondary | ICD-10-CM | POA: Insufficient documentation

## 2011-12-28 DIAGNOSIS — I10 Essential (primary) hypertension: Secondary | ICD-10-CM | POA: Insufficient documentation

## 2011-12-28 DIAGNOSIS — R509 Fever, unspecified: Secondary | ICD-10-CM | POA: Insufficient documentation

## 2011-12-28 DIAGNOSIS — K589 Irritable bowel syndrome without diarrhea: Secondary | ICD-10-CM | POA: Insufficient documentation

## 2011-12-28 DIAGNOSIS — Z8719 Personal history of other diseases of the digestive system: Secondary | ICD-10-CM | POA: Insufficient documentation

## 2011-12-28 DIAGNOSIS — Z79899 Other long term (current) drug therapy: Secondary | ICD-10-CM | POA: Insufficient documentation

## 2011-12-28 DIAGNOSIS — Z9889 Other specified postprocedural states: Secondary | ICD-10-CM | POA: Insufficient documentation

## 2011-12-28 DIAGNOSIS — R51 Headache: Secondary | ICD-10-CM | POA: Insufficient documentation

## 2011-12-28 DIAGNOSIS — R6889 Other general symptoms and signs: Secondary | ICD-10-CM

## 2011-12-28 MED ORDER — IPRATROPIUM BROMIDE 0.03 % NA SOLN: 2.0000 | Freq: Two times a day (BID) | NASAL | Status: DC

## 2011-12-28 MED ORDER — ALBUTEROL SULFATE (5 MG/ML) 0.5% IN NEBU
2.5000 mg | INHALATION_SOLUTION | Freq: Once | RESPIRATORY_TRACT | Status: AC
  Administered 2011-12-28: 5 mg via RESPIRATORY_TRACT
  Filled 2011-12-28: qty 1

## 2011-12-28 MED ORDER — GUAIFENESIN ER 600 MG PO TB12: 1200.0000 mg | ORAL_TABLET | Freq: Two times a day (BID) | ORAL | Status: DC

## 2011-12-28 MED ORDER — ACETAMINOPHEN 325 MG PO TABS
975.0000 mg | ORAL_TABLET | Freq: Once | ORAL | Status: AC
  Administered 2011-12-28: 975 mg via ORAL
  Filled 2011-12-28: qty 3

## 2011-12-28 MED ORDER — HYDROCODONE-HOMATROPINE 5-1.5 MG/5ML PO SYRP: 5.0000 mL | ORAL_SOLUTION | Freq: Four times a day (QID) | ORAL | Status: DC | PRN

## 2011-12-28 MED ORDER — ALBUTEROL SULFATE HFA 108 (90 BASE) MCG/ACT IN AERS: 2.0000 | INHALATION_SPRAY | RESPIRATORY_TRACT | Status: DC | PRN

## 2011-12-28 NOTE — ED Provider Notes (Signed)
History     CSN: [id number]  Arrival date & time 12/28/11  1727   First MD Initiated Contact with Patient 12/28/11 2039      Chief Complaint  Patient presents with  . Flu symptoms     (Consider location/radiation/quality/duration/timing/severity/associated sxs/prior treatment) The history is provided by the patient and medical records.    Briana Wade is a 41 y.o. female  with a hx of HTN presents to the Emergency Department complaining of gradual, persistent, progressively worsening cough onset 2 weeks ago. Associated symptoms include rhinorrhea, chest pain, headache, subjective fever, chills, shortness of breath, post tussive vomiting, myalgias.  Chest pain is worse with coughing and moving, described as tightness and rated at a 2/10.  Nothing makes it better and nothing makes it worse.  Pt denies neck pain, abdominal pain, nausea, diarrhea, weakness, dizziness, rash.  Pt states it is hard to sleep 2/2 all the coughing.  No known sick contacts.  Lisinopril Rx by a PCP doctor and she felt this was 2/2 to the medication therefore she stopped taking it.     Past Medical History  Diagnosis Date  . Other constipation   . Abdominal pain, right lateral   . Hypertension   . Migraine   . IBS (irritable bowel syndrome)   . Umbilical hernia   . Uterine fibroid   . Chlamydia   . Hiatal hernia   . Esophageal reflux   . Personal history of colonic polyps 09/12/2010    tubular adenoma  . Lactose intolerance     Past Surgical History  Procedure Date  . Appendectomy   . Tubal ligation   . Appendectomy     Family History  Problem Relation Age of Onset  . Colon cancer Neg Hx   . Hypertension Mother   . Cancer Mother     not sure of type    History  Substance Use Topics  . Smoking status: Never Smoker   . Smokeless tobacco: Never Used  . Alcohol Use: Yes     Comment: occasionally    OB History    Grav Para Term Preterm Abortions TAB SAB Ect Mult Living                   Review of Systems  Constitutional: Positive for fever, chills and fatigue. Negative for diaphoresis, appetite change and unexpected weight change.  HENT: Positive for congestion, rhinorrhea, postnasal drip and sinus pressure. Negative for mouth sores, neck pain and neck stiffness.   Eyes: Negative for visual disturbance.  Respiratory: Positive for cough, chest tightness and shortness of breath. Negative for wheezing.   Cardiovascular: Positive for chest pain.  Gastrointestinal: Positive for vomiting. Negative for nausea, abdominal pain, diarrhea and constipation.  Genitourinary: Negative for dysuria, urgency, frequency and hematuria.  Musculoskeletal: Negative for back pain.  Skin: Negative for rash.  Neurological: Positive for headaches. Negative for syncope and light-headedness.  Psychiatric/Behavioral: Positive for sleep disturbance. The patient is not nervous/anxious.   All other systems reviewed and are negative.    Allergies  Review of patient's allergies indicates no known allergies.  Home Medications   Current Outpatient Rx  Name  Route  Sig  Dispense  Refill  . ESOMEPRAZOLE MAGNESIUM 40 MG PO CPDR   Oral   Take 40 mg by mouth daily before breakfast.         . LISINOPRIL-HYDROCHLOROTHIAZIDE 20-25 MG PO TABS   Oral   Take 1 tablet by mouth daily.         Marland Kitchen  ALBUTEROL SULFATE HFA 108 (90 BASE) MCG/ACT IN AERS   Inhalation   Inhale 2 puffs into the lungs every 4 (four) hours as needed for wheezing.   3.7 g   0   . GUAIFENESIN ER 600 MG PO TB12   Oral   Take 2 tablets (1,200 mg total) by mouth 2 (two) times daily.   20 tablet   0   . HYDROCODONE-HOMATROPINE 5-1.5 MG/5ML PO SYRP   Oral   Take 5 mLs by mouth every 6 (six) hours as needed for cough.   120 mL   0   . IPRATROPIUM BROMIDE 0.03 % NA SOLN   Nasal   Place 2 sprays into the nose every 12 (twelve) hours.   30 mL   12     BP 142/94  Pulse 80  Temp 98.5 F (36.9 C) (Oral)  Resp 17  SpO2  100%  LMP 12/21/2011  Physical Exam  Nursing note and vitals reviewed. Constitutional: She appears well-developed and well-nourished. No distress.  HENT:  Head: Normocephalic and atraumatic.  Right Ear: Tympanic membrane, external ear and ear canal normal.  Left Ear: Tympanic membrane, external ear and ear canal normal.  Nose: Mucosal edema and rhinorrhea present. Right sinus exhibits no maxillary sinus tenderness and no frontal sinus tenderness. Left sinus exhibits no maxillary sinus tenderness and no frontal sinus tenderness.  Mouth/Throat: Uvula is midline, oropharynx is clear and moist and mucous membranes are normal. No oropharyngeal exudate, posterior oropharyngeal edema, posterior oropharyngeal erythema or tonsillar abscesses.  Eyes: Conjunctivae normal are normal. Pupils are equal, round, and reactive to light. No scleral icterus.  Neck: Normal range of motion. Neck supple.  Cardiovascular: Normal rate, regular rhythm, normal heart sounds and intact distal pulses.  Exam reveals no gallop and no friction rub.   No murmur heard. Pulmonary/Chest: Effort normal. No respiratory distress. She has decreased breath sounds (throughout). She has no wheezes. She has no rhonchi. She has no rales.  Abdominal: Soft. Bowel sounds are normal. She exhibits no distension and no mass. There is no tenderness. There is no rebound and no guarding.  Musculoskeletal: Normal range of motion. She exhibits no edema and no tenderness.  Lymphadenopathy:    She has no cervical adenopathy.  Neurological: She is alert. She exhibits normal muscle tone. Coordination normal.       Speech is clear and goal oriented Moves extremities without ataxia  Skin: Skin is warm and dry. No rash noted. She is not diaphoretic. No erythema.  Psychiatric: She has a normal mood and affect.    ED Course  Procedures (including critical care time)  Labs Reviewed - No data to display Dg Chest 2 View  12/28/2011  *RADIOLOGY REPORT*   Clinical Data: Cough and chest pain  CHEST - 2 VIEW  Comparison: None.  Findings: The heart size and mediastinal contours are within normal limits.  Both lungs are clear.  The visualized skeletal structures are unremarkable.  IMPRESSION: No active cardiopulmonary abnormalities.   Original Report Authenticated By: Signa Kell, M.D.      1. URI (upper respiratory infection)   2. HTN (hypertension)   3. Flu-like symptoms       MDM  Briana Wade presents with URI and viral symptoms.  Pt CXR negative for acute infiltrate. Patient states she feels much better after albuterol treatment and Tylenol. She states she is breathing much better and her chest pain and tightness have resolved completely.   On exam lungs clear to  auscultation bilaterally and without persistent decreased breath sounds. Patients symptoms are consistent with URI, likely viral etiology. Discussed that antibiotics are not indicated for viral infections. Pt will be discharged with symptomatic treatment.  Verbalizes understanding and is agreeable with plan. Pt is hemodynamically stable & in NAD prior to dc.  1. Medications: atrovent nasal spray, hycodan for coughing, mucinex for mucus production, albuterol for shortness of breath, usual home medications 2. Treatment: rest, drink plenty of fluids,  3. Follow Up: Please followup with your primary doctor for discussion of your diagnoses and further evaluation after today's visit; if you do not have a primary care doctor use the resource guide provided to find one;    Dierdre Forth, PA-C 12/29/11 0222

## 2011-12-28 NOTE — ED Notes (Signed)
Pt states she was on some blood pressure pills which have caused her to cough. Now pt states cough has gotten worse over the last week and pt has headache, body aches now. Pt states she is coughing up some [place] phelgm.

## 2011-12-31 NOTE — ED Provider Notes (Signed)
Medical screening examination/treatment/procedure(s) were performed by non-physician practitioner and as supervising physician I was immediately available for consultation/collaboration.   Joya Gaskins, MD 12/31/11 [phone]

## 2012-02-26 ENCOUNTER — Other Ambulatory Visit (HOSPITAL_COMMUNITY)
Admission: RE | Admit: 2012-02-26 | Discharge: 2012-02-26 | Disposition: A | Discharge status: Home / Self Care | Payer: Medicaid Other | Source: Ambulatory Visit | Attending: Medical | Admitting: Medical

## 2012-02-26 ENCOUNTER — Encounter: Payer: Self-pay | Admitting: Medical

## 2012-02-26 ENCOUNTER — Ambulatory Visit (INDEPENDENT_AMBULATORY_CARE_PROVIDER_SITE_OTHER): Payer: Medicaid Other | Admitting: Medical

## 2012-02-26 VITALS — BP 125/83 | HR 82 | Temp 97.6°F | Resp 20 | Ht 60.5 in | Wt 214.9 lb

## 2012-02-26 DIAGNOSIS — Z01419 Encounter for gynecological examination (general) (routine) without abnormal findings: Secondary | ICD-10-CM

## 2012-02-26 DIAGNOSIS — N760 Acute vaginitis: Secondary | ICD-10-CM | POA: Insufficient documentation

## 2012-02-26 DIAGNOSIS — N898 Other specified noninflammatory disorders of vagina: Secondary | ICD-10-CM

## 2012-02-26 DIAGNOSIS — Z7251 High risk heterosexual behavior: Secondary | ICD-10-CM

## 2012-02-26 DIAGNOSIS — Z113 Encounter for screening for infections with a predominantly sexual mode of transmission: Secondary | ICD-10-CM | POA: Insufficient documentation

## 2012-02-26 LAB — HIV ANTIBODY (ROUTINE TESTING W REFLEX): HIV: NONREACTIVE

## 2012-02-26 LAB — HEPATITIS C ANTIBODY: HCV Ab: NEGATIVE

## 2012-02-26 LAB — RPR

## 2012-02-26 LAB — HEPATITIS B SURFACE ANTIGEN: Hepatitis B Surface Ag: NEGATIVE

## 2012-02-26 NOTE — Patient Instructions (Addendum)
Pap Test A Pap test checks the cells on the surface of your cervix. Your doctor will look for cell changes that are not normal, an infection, or cancer. If the cells no longer look normal, it is called dysplasia. Dysplasia can turn into cancer. Regular Pap tests are important to stop cancer from developing. BEFORE THE PROCEDURE  Ask your doctor when to schedule your Pap test. Timing the test around your period may be important.  Do not douche or have sex (intercourse) for 24 hours before the test.  Do not put creams on your vagina or use tampons for 24 hours before the test.  Go pee (urinate) just before the test. PROCEDURE  You will lie on an exam table with your feet in stirrups.  A warm metal or plastic tool (speculum) will be put in your vagina to open it up.  Your doctor will use a small, plastic brush or wooden spatula to take cells from your cervix.  The cells will be put in a lab container.  The cells will be checked under a microscope to see if they are normal or not. AFTER THE PROCEDURE Get your test results. If they are abnormal, you may need more tests. Document Released: 02/14/2010 Document Revised: 04/06/2011 Document Reviewed: 01/08/2011 [employer] Patient Information 2013 [employer].  

## 2012-02-26 NOTE — Progress Notes (Signed)
Last Pap 06/2010- negative

## 2012-02-26 NOTE — Progress Notes (Signed)
Patient ID: Briana Wade Wade, female   DOB: 1970/10/15, 42 y.o.   MRN: [id number]  History:  [name] Wade  is a 42 y.o. [id number] who presents to clinic today for annual exam. The patient's last pap smear was obtained in 2012 and was normal. She has never had an abnormal pap. She does desire STD testing today. The patient is currently sexually active with 2 regular partners. She states that she uses condoms with one but not with the other. Both partners are female. She has had a BTL. She denies abnormal discharge, irregular bleeding, pelvic pain but does state that she has noticed a vaginal odor for the last few weeks. She does not have any concerns about her breasts today. She will call the breast center to schedule her next mammogram when it is due later this year.    The following portions of the patient's history were reviewed and updated as appropriate: allergies, current medications, past family history, past medical history, past social history, past surgical history and problem list.  Review of Systems:  Pertinent items are noted in HPI.  Objective:  Physical Exam BP 125/83  Pulse 82  Temp 97.6 F (36.4 C) (Oral)  Resp 20  Ht 5' 0.5" (1.537 m)  Wt 214 lb 14.4 oz (97.478 kg)  BMI 41.28 kg/m2  LMP 02/02/2012 GENERAL: Well-developed, obese female in no acute distress.  HEENT: Normocephalic, atraumatic.  LUNGS: Normal rate. Clear to auscultation bilaterally.  HEART: Regular rate and rhythm with no adventitious sounds.  BREASTS: Symmetric in size. No masses, skin changes, nipple drainage, or lymphadenopathy. ABDOMEN: Soft, nontender, nondistended. No organomegaly. Normal bowel sounds appreciated in all quadrants.  PELVIC: Normal external female genitalia. Vagina is pink and rugated.  Normal discharge. Normal cervix contour. Aptima obtained. Uterus is normal in size. No adnexal mass or tenderness.  EXTREMITIES: No cyanosis, clubbing, or edema, 2+ distal pulses.   Labs and  Imaging Aptima sent HIV, RPR, Hep B and Hep C drawn today  Assessment & Plan:  Assessment: 1. Annual exam, normal 2. STD testing  Plans: Aptima swab obtained today for GC/CT, Trich, BV and yeast HIV, Hep B, Hep C and RPR drawn today Patient will be notified with abnormal results only Patient will contact breast center to schedule annual mammogram later this year Patient will return to clinic in 1 year for annual exam or sooner as needed. Next pap smear is due in 2015.   Freddi Starr, PA-C 02/26/2012 11:21 AM

## 2012-03-01 ENCOUNTER — Other Ambulatory Visit: Payer: Self-pay | Admitting: Medical

## 2012-03-01 DIAGNOSIS — A599 Trichomoniasis, unspecified: Secondary | ICD-10-CM

## 2012-03-01 DIAGNOSIS — B9689 Other specified bacterial agents as the cause of diseases classified elsewhere: Secondary | ICD-10-CM

## 2012-03-01 MED ORDER — METRONIDAZOLE 500 MG PO TABS: 500.0000 mg | ORAL_TABLET | Freq: Two times a day (BID) | ORAL | Status: DC

## 2012-03-02 ENCOUNTER — Telehealth: Payer: Self-pay | Admitting: General Practice

## 2012-03-02 NOTE — Telephone Encounter (Signed)
Message copied by Kathee Delton on Wed Mar 02, 2012  9:11 AM ------      Message from: Freddi Starr      Created: Tue Mar 01, 2012  3:09 PM       Patient has BV and Trich. Please inform the patient of both diagnoses.Rx for Flagyl sent to her pharmacy today. Thanks!

## 2012-03-02 NOTE — Telephone Encounter (Signed)
Called patient and informed her of BV & trich and that the medicine to treat both is at [facility] on [address] in [place] and that she will have to take the medicine twice a day for a week. Told patient that trich was an STD and that both her and her partner will need treatment for this and to avoid intercourse for one week while they're being treated. Patient verbalized understanding and had no further questions

## 2012-05-05 ENCOUNTER — Encounter: Payer: Self-pay | Admitting: Gastroenterology

## 2012-05-05 NOTE — Telephone Encounter (Signed)
error 

## 2012-05-06 ENCOUNTER — Emergency Department (HOSPITAL_COMMUNITY): Payer: Medicaid Other

## 2012-05-06 ENCOUNTER — Emergency Department (HOSPITAL_COMMUNITY)
Admission: EM | Admit: 2012-05-06 | Discharge: 2012-05-07 | Disposition: A | Discharge status: Home / Self Care | Payer: Medicaid Other | Attending: Emergency Medicine | Admitting: Emergency Medicine

## 2012-05-06 ENCOUNTER — Encounter (HOSPITAL_COMMUNITY): Payer: Self-pay | Admitting: Emergency Medicine

## 2012-05-06 DIAGNOSIS — Z8719 Personal history of other diseases of the digestive system: Secondary | ICD-10-CM | POA: Insufficient documentation

## 2012-05-06 DIAGNOSIS — Z8619 Personal history of other infectious and parasitic diseases: Secondary | ICD-10-CM | POA: Insufficient documentation

## 2012-05-06 DIAGNOSIS — Z860100 Personal history of colon polyps, unspecified: Secondary | ICD-10-CM | POA: Insufficient documentation

## 2012-05-06 DIAGNOSIS — Z3202 Encounter for pregnancy test, result negative: Secondary | ICD-10-CM | POA: Insufficient documentation

## 2012-05-06 DIAGNOSIS — K219 Gastro-esophageal reflux disease without esophagitis: Secondary | ICD-10-CM | POA: Insufficient documentation

## 2012-05-06 DIAGNOSIS — R109 Unspecified abdominal pain: Secondary | ICD-10-CM

## 2012-05-06 DIAGNOSIS — Z8679 Personal history of other diseases of the circulatory system: Secondary | ICD-10-CM | POA: Insufficient documentation

## 2012-05-06 DIAGNOSIS — Z8742 Personal history of other diseases of the female genital tract: Secondary | ICD-10-CM | POA: Insufficient documentation

## 2012-05-06 DIAGNOSIS — Z862 Personal history of diseases of the blood and blood-forming organs and certain disorders involving the immune mechanism: Secondary | ICD-10-CM | POA: Insufficient documentation

## 2012-05-06 DIAGNOSIS — I10 Essential (primary) hypertension: Secondary | ICD-10-CM | POA: Insufficient documentation

## 2012-05-06 DIAGNOSIS — Z79899 Other long term (current) drug therapy: Secondary | ICD-10-CM | POA: Insufficient documentation

## 2012-05-06 DIAGNOSIS — Z8639 Personal history of other endocrine, nutritional and metabolic disease: Secondary | ICD-10-CM | POA: Insufficient documentation

## 2012-05-06 LAB — POCT I-STAT, CHEM 8
BUN: 12 mg/dL (ref 6–23)
Chloride: 105 mEq/L (ref 96–112)
HCT: 40 % (ref 36.0–46.0)
Potassium: 5.9 mEq/L — ABNORMAL HIGH (ref 3.5–5.1)

## 2012-05-06 LAB — CBC WITH DIFFERENTIAL/PLATELET
Basophils Absolute: 0 10*3/uL (ref 0.0–0.1)
Basophils Relative: 0 % (ref 0–1)
Eosinophils Absolute: 0.2 10*3/uL (ref 0.0–0.7)
Hemoglobin: 13.3 g/dL (ref 12.0–15.0)
MCH: 28.6 pg (ref 26.0–34.0)
MCHC: 33.8 g/dL (ref 30.0–36.0)
Monocytes Relative: 7 % (ref 3–12)
Neutro Abs: 2.7 10*3/uL (ref 1.7–7.7)
Neutrophils Relative %: 47 % (ref 43–77)
RDW: 13.6 % (ref 11.5–15.5)

## 2012-05-06 LAB — URINALYSIS, ROUTINE W REFLEX MICROSCOPIC
Bilirubin Urine: NEGATIVE
Hgb urine dipstick: NEGATIVE
Nitrite: NEGATIVE
Protein, ur: NEGATIVE mg/dL
Specific Gravity, Urine: 1.027 (ref 1.005–1.030)
Urobilinogen, UA: 0.2 mg/dL (ref 0.0–1.0)

## 2012-05-06 LAB — PREGNANCY, URINE: Preg Test, Ur: NEGATIVE

## 2012-05-06 MED ORDER — SODIUM CHLORIDE 0.9 % IV BOLUS (SEPSIS)
1000.0000 mL | Freq: Once | INTRAVENOUS | Status: AC
  Administered 2012-05-06: 1000 mL via INTRAVENOUS

## 2012-05-06 MED ORDER — ONDANSETRON HCL 4 MG/2ML IJ SOLN
4.0000 mg | Freq: Once | INTRAMUSCULAR | Status: AC
  Administered 2012-05-06: 4 mg via INTRAVENOUS
  Filled 2012-05-06: qty 2

## 2012-05-06 MED ORDER — IOHEXOL 300 MG/ML  SOLN
100.0000 mL | Freq: Once | INTRAMUSCULAR | Status: AC | PRN
  Administered 2012-05-06: 100 mL via INTRAVENOUS

## 2012-05-06 MED ORDER — MORPHINE SULFATE 4 MG/ML IJ SOLN
6.0000 mg | Freq: Once | INTRAMUSCULAR | Status: AC
  Administered 2012-05-06: 6 mg via INTRAVENOUS
  Filled 2012-05-06: qty 2

## 2012-05-06 MED ORDER — IOHEXOL 300 MG/ML  SOLN
50.0000 mL | Freq: Once | INTRAMUSCULAR | Status: AC | PRN
  Administered 2012-05-06: 50 mL via ORAL

## 2012-05-06 NOTE — ED Notes (Signed)
Pt is c/o right flank pain that started about 3 days ago  Pt states pain is worse when she walks  Pt has nausea without vomiting or diarrhea

## 2012-05-06 NOTE — ED Notes (Signed)
Attempted to access IV for meds and blood draws, unable to obtain. IV  Team paged.

## 2012-05-07 LAB — COMPREHENSIVE METABOLIC PANEL
ALT: 14 U/L (ref 0–35)
AST: 17 U/L (ref 0–37)
Albumin: 4 g/dL (ref 3.5–5.2)
Alkaline Phosphatase: 89 U/L (ref 39–117)
BUN: 9 mg/dL (ref 6–23)
CO2: 29 mEq/L (ref 19–32)
Calcium: 9.3 mg/dL (ref 8.4–10.5)
Chloride: 100 mEq/L (ref 96–112)
Creatinine, Ser: 0.8 mg/dL (ref 0.50–1.10)
GFR calc Af Amer: >90 mL/min (ref >90)
GFR calc non Af Amer: 90 mL/min — ABNORMAL LOW (ref >90)
Glucose, Bld: 93 mg/dL (ref 70–99)
Potassium: 4.1 mEq/L (ref 3.5–5.1)
Sodium: 137 mEq/L (ref 135–145)
Total Bilirubin: 0.2 mg/dL — ABNORMAL LOW (ref 0.3–1.2)
Total Protein: 8.2 g/dL (ref 6.0–8.3)

## 2012-05-07 MED ORDER — PROMETHAZINE HCL 25 MG PO TABS: 25.0000 mg | ORAL_TABLET | Freq: Four times a day (QID) | ORAL | Status: DC | PRN

## 2012-05-07 MED ORDER — HYDROCODONE-ACETAMINOPHEN 5-325 MG PO TABS
1.0000 | ORAL_TABLET | Freq: Four times a day (QID) | ORAL | Status: DC | PRN
Start: 2012-05-07 — End: 2012-10-11

## 2012-05-07 NOTE — ED Provider Notes (Signed)
History     CSN: [id number]  Arrival date & time 05/06/12  2039   First MD Initiated Contact with Patient 05/06/12 2055      Chief Complaint  Patient presents with  . Flank Pain    (Consider location/radiation/quality/duration/timing/severity/associated sxs/prior treatment) HPI Patient presents to the emergency department with right flank pain.  Patient, states it began 3 days, ago.  Has gotten persistently worse.  Patient denies vomiting, diarrhea, chest pain, shortness of breath, fever, dizziness, syncope, rash, headache, dysuria, or back pain.  Patient, states, that this feels, like diverticulitis that she has had in the past.  Patient did not take anything prior to arrival for her symptoms.  Patient, states, that palpation makes her pain, worse Past Medical History  Diagnosis Date  . Other constipation   . Abdominal pain, unspecified site   . Hypertension   . Migraine   . IBS (irritable bowel syndrome)   . Umbilical hernia   . Uterine fibroid   . Chlamydia   . Esophageal reflux   . Personal history of colonic polyps 09/12/2010    tubular adenoma  . Lactose intolerance   . Hiatal hernia   . Diverticulitis     Past Surgical History  Procedure Laterality Date  . Appendectomy    . Tubal ligation    . Appendectomy      Family History  Problem Relation Age of Onset  . Colon cancer Neg Hx   . Hypertension Mother   . Cancer Mother     not sure of type    History  Substance Use Topics  . Smoking status: Never Smoker   . Smokeless tobacco: Never Used  . Alcohol Use: Yes     Comment: occasionally    OB History   Grav Para Term Preterm Abortions TAB SAB Ect Mult Living   7 3 3  4 3 1   3       Review of Systems All other systems negative except as documented in the HPI. All pertinent positives and negatives as reviewed in the HPI. Allergies  Review of patient's allergies indicates no known allergies.  Home Medications   Current Outpatient Rx  Name  Route   Sig  Dispense  Refill  . esomeprazole (NEXIUM) 40 MG capsule   Oral   Take 40 mg by mouth daily as needed. For acid reflux         . ipratropium (ATROVENT) 0.03 % nasal spray   Nasal   Place 2 sprays into the nose 2 (two) times daily as needed for rhinitis. For nasal drainage         . lisinopril-hydrochlorothiazide (PRINZIDE,ZESTORETIC) 20-25 MG per tablet   Oral   Take 1 tablet by mouth daily.         Marland Kitchen albuterol (PROVENTIL HFA;VENTOLIN HFA) 108 (90 BASE) MCG/ACT inhaler   Inhalation   Inhale 2 puffs into the lungs every 4 (four) hours as needed for wheezing.   3.7 g   0     BP 150/96  Pulse 75  Temp(Src) 98 F (36.7 C)  SpO2 98%  LMP 04/27/2012  Physical Exam  Nursing note and vitals reviewed. Constitutional: She is oriented to person, place, and time. She appears well-developed and well-nourished. No distress.  HENT:  Head: Normocephalic and atraumatic.  Mouth/Throat: Oropharynx is clear and moist.  Eyes: Pupils are equal, round, and reactive to light.  Neck: Normal range of motion. Neck supple.  Cardiovascular: Normal rate, regular rhythm and normal  heart sounds.  Exam reveals no gallop and no friction rub.   No murmur heard. Pulmonary/Chest: Effort normal and breath sounds normal.  Abdominal: Soft. Bowel sounds are normal. There is tenderness. There is no rigidity, no rebound and no guarding. No hernia.    Neurological: She is alert and oriented to person, place, and time.  Skin: Skin is warm and dry. No rash noted.    ED Course  Procedures (including critical care time)  Labs Reviewed  COMPREHENSIVE METABOLIC PANEL - Abnormal; Notable for the following:    Total Bilirubin 0.2 (*)    GFR calc non Af Amer 90 (*)    All other components within normal limits  POCT I-STAT, CHEM 8 - Abnormal; Notable for the following:    Sodium 133 (*)    Potassium 5.9 (*)    Calcium, Ion 0.91 (*)    All other components within normal limits  CBC WITH DIFFERENTIAL    URINALYSIS, ROUTINE W REFLEX MICROSCOPIC  PREGNANCY, URINE   Ct Abdomen Pelvis W Contrast  05/06/2012  *RADIOLOGY REPORT*  Clinical Data: 42 year old female with right flank pain starting 3 days ago.  Nausea.  History of appendectomy.  CT ABDOMEN AND PELVIS WITH CONTRAST  Technique:  Multidetector CT imaging of the abdomen and pelvis was performed following the standard protocol during bolus administration of intravenous contrast.  Contrast: OMNIPAQUE IOHEXOL 300 MG/ML  SOLN, 50mL OMNIPAQUE IOHEXOL 300 MG/ML  SOLN  Comparison: 06/04/2011 and earlier.  Findings: Lower lung volumes.  Lung base ground-glass opacity probably relate to atelectasis.  No pericardial or pleural effusion. Stable visualized osseous structures.  Trace pelvic free fluid.  Uterus and adnexa appear stable within normal limits. Negative rectum.  No residual sigmoid colon inflammation.  Occasional left colon diverticula again noted.  Retained stool in the colon.  Oral contrast has not yet reached the terminal ileum.  No dilated small bowel.  Stable small fat containing umbilical hernia.  Small hiatal hernia.  Otherwise negative stomach and duodenum.  Liver, gallbladder, spleen, pancreas and adrenal glands are within normal limits.  Portal venous system and major arterial structures in the abdomen and pelvis are within normal limits.  No abdominal free fluid.  Normal kidneys.  Normal ureters.  Bladder unremarkable.  No lymphadenopathy.  IMPRESSION: Interval resolved sigmoid diverticulitis.  No acute or inflammatory findings identified in the abdomen or pelvis.  Mild lung base atelectasis.   Original Report Authenticated By: Erskine Speed, M.D.    Patient be treated for right flank pain.  The patient does not have any significant findings on her CT scan.  Patient does have resolved diverticulitis.  Patient be referred back to her primary care she is told to return to the emergency department for any worsening in her condition.     MDM    MDM Reviewed: vitals and nursing note Interpretation: labs and CT scan          Carlyle Dolly, PA-C 05/07/12 0116

## 2012-05-08 ENCOUNTER — Emergency Department (HOSPITAL_COMMUNITY)
Admission: EM | Admit: 2012-05-08 | Discharge: 2012-05-09 | Disposition: A | Discharge status: Home / Self Care | Payer: Medicaid Other | Attending: Emergency Medicine | Admitting: Emergency Medicine

## 2012-05-08 ENCOUNTER — Encounter (HOSPITAL_COMMUNITY): Payer: Self-pay | Admitting: *Deleted

## 2012-05-08 DIAGNOSIS — Z860100 Personal history of colon polyps, unspecified: Secondary | ICD-10-CM | POA: Insufficient documentation

## 2012-05-08 DIAGNOSIS — K219 Gastro-esophageal reflux disease without esophagitis: Secondary | ICD-10-CM | POA: Insufficient documentation

## 2012-05-08 DIAGNOSIS — Z8719 Personal history of other diseases of the digestive system: Secondary | ICD-10-CM | POA: Insufficient documentation

## 2012-05-08 DIAGNOSIS — Z8679 Personal history of other diseases of the circulatory system: Secondary | ICD-10-CM | POA: Insufficient documentation

## 2012-05-08 DIAGNOSIS — R109 Unspecified abdominal pain: Secondary | ICD-10-CM | POA: Insufficient documentation

## 2012-05-08 DIAGNOSIS — Z8742 Personal history of other diseases of the female genital tract: Secondary | ICD-10-CM | POA: Insufficient documentation

## 2012-05-08 DIAGNOSIS — Z8619 Personal history of other infectious and parasitic diseases: Secondary | ICD-10-CM | POA: Insufficient documentation

## 2012-05-08 DIAGNOSIS — R51 Headache: Secondary | ICD-10-CM | POA: Insufficient documentation

## 2012-05-08 DIAGNOSIS — I10 Essential (primary) hypertension: Secondary | ICD-10-CM | POA: Insufficient documentation

## 2012-05-08 DIAGNOSIS — E739 Lactose intolerance, unspecified: Secondary | ICD-10-CM | POA: Insufficient documentation

## 2012-05-08 DIAGNOSIS — N898 Other specified noninflammatory disorders of vagina: Secondary | ICD-10-CM | POA: Insufficient documentation

## 2012-05-08 DIAGNOSIS — Z79899 Other long term (current) drug therapy: Secondary | ICD-10-CM | POA: Insufficient documentation

## 2012-05-08 DIAGNOSIS — Z3202 Encounter for pregnancy test, result negative: Secondary | ICD-10-CM | POA: Insufficient documentation

## 2012-05-08 LAB — CBC WITH DIFFERENTIAL/PLATELET
Basophils Absolute: 0 10*3/uL (ref 0.0–0.1)
Basophils Relative: 0 % (ref 0–1)
HCT: 40.1 % (ref 36.0–46.0)
Hemoglobin: 13.4 g/dL (ref 12.0–15.0)
Lymphocytes Relative: 25 % (ref 12–46)
Monocytes Absolute: 0.5 10*3/uL (ref 0.1–1.0)
Monocytes Relative: 11 % (ref 3–12)
Neutro Abs: 3.1 10*3/uL (ref 1.7–7.7)
Neutrophils Relative %: 64 % (ref 43–77)
RDW: 13.3 % (ref 11.5–15.5)
WBC: 4.9 10*3/uL (ref 4.0–10.5)

## 2012-05-08 LAB — URINALYSIS, ROUTINE W REFLEX MICROSCOPIC
Bilirubin Urine: NEGATIVE
Ketones, ur: NEGATIVE mg/dL
Leukocytes, UA: NEGATIVE
Nitrite: NEGATIVE
Protein, ur: NEGATIVE mg/dL

## 2012-05-08 LAB — POCT I-STAT, CHEM 8
BUN: 5 mg/dL — ABNORMAL LOW (ref 6–23)
Calcium, Ion: 1.15 mmol/L (ref 1.12–1.23)
Chloride: 102 mEq/L (ref 96–112)
HCT: 43 % (ref 36.0–46.0)
Potassium: 4 mEq/L (ref 3.5–5.1)

## 2012-05-08 LAB — WET PREP, GENITAL: Clue Cells Wet Prep HPF POC: NONE SEEN

## 2012-05-08 MED ORDER — HYDROCODONE-ACETAMINOPHEN 5-325 MG PO TABS
1.0000 | ORAL_TABLET | Freq: Once | ORAL | Status: AC
  Administered 2012-05-08: 1 via ORAL
  Filled 2012-05-08: qty 1

## 2012-05-08 MED ORDER — IBUPROFEN 200 MG PO TABS
600.0000 mg | ORAL_TABLET | Freq: Once | ORAL | Status: AC
  Administered 2012-05-08: 600 mg via ORAL
  Filled 2012-05-08: qty 1

## 2012-05-08 MED ORDER — ONDANSETRON 8 MG PO TBDP
8.0000 mg | ORAL_TABLET | Freq: Once | ORAL | Status: AC
  Administered 2012-05-08: 8 mg via ORAL
  Filled 2012-05-08: qty 1

## 2012-05-08 NOTE — ED Notes (Signed)
In additional to chief compaint . Off and on having fluttering pains in chest -

## 2012-05-08 NOTE — ED Provider Notes (Addendum)
History     CSN: [id number]  Arrival date & time 05/08/12  5621   First MD Initiated Contact with Patient 05/08/12 1812      Chief Complaint  Patient presents with  . Flank Pain  . Headache    (Consider location/radiation/quality/duration/timing/severity/associated sxs/prior treatment) HPI Comments: Pt comes in with cc of abd pain. Pt has hx of diverticular dz, and was seen 2 days ago. CT abd didn't show diverticulitis. Pt was sent home with pain meds and nausea meds, which she hasn't filled up. She returns today with the same abd pain - it is right sided, lateral pain, level of the umbilicus. There is no specific aggravating or relieving factors, and no emesis, fevers, chills. Pt is nauseated. She has no UTI like sx. Denies any vaginal bleeding, discharge.  Patient is a 42 y.o. female presenting with flank pain and headaches. The history is provided by the patient and medical records.  Flank Pain Associated symptoms include abdominal pain and headaches. Pertinent negatives include no chest pain and no shortness of breath.  Headache Associated symptoms: abdominal pain   Associated symptoms: no cough, no diarrhea, no nausea, no neck pain and no vomiting     Past Medical History  Diagnosis Date  . Other constipation   . Abdominal pain, unspecified site   . Hypertension   . Migraine   . IBS (irritable bowel syndrome)   . Umbilical hernia   . Uterine fibroid   . Chlamydia   . Esophageal reflux   . Personal history of colonic polyps 09/12/2010    tubular adenoma  . Lactose intolerance   . Hiatal hernia   . Diverticulitis     Past Surgical History  Procedure Laterality Date  . Appendectomy    . Tubal ligation    . Appendectomy      Family History  Problem Relation Age of Onset  . Colon cancer Neg Hx   . Hypertension Mother   . Cancer Mother     not sure of type    History  Substance Use Topics  . Smoking status: Never Smoker   . Smokeless tobacco: Never Used   . Alcohol Use: Yes     Comment: occasionally    OB History   Grav Para Term Preterm Abortions TAB SAB Ect Mult Living   7 3 3  4 3 1   3       Review of Systems  Constitutional: Negative for activity change.  HENT: Negative for facial swelling and neck pain.   Respiratory: Negative for cough, shortness of breath and wheezing.   Cardiovascular: Negative for chest pain.  Gastrointestinal: Positive for abdominal pain. Negative for nausea, vomiting, diarrhea, constipation, blood in stool and abdominal distention.  Genitourinary: Positive for flank pain. Negative for dysuria, hematuria and difficulty urinating.  Skin: Negative for color change.  Neurological: Positive for headaches. Negative for speech difficulty.  Hematological: Does not bruise/bleed easily.  Psychiatric/Behavioral: Negative for confusion.    Allergies  Review of patient's allergies indicates no known allergies.  Home Medications   Current Outpatient Rx  Name  Route  Sig  Dispense  Refill  . albuterol (PROVENTIL HFA;VENTOLIN HFA) 108 (90 BASE) MCG/ACT inhaler   Inhalation   Inhale 2 puffs into the lungs every 4 (four) hours as needed for wheezing.   3.7 g   0   . esomeprazole (NEXIUM) 40 MG capsule   Oral   Take 40 mg by mouth daily as needed. For acid reflux         .  ipratropium (ATROVENT) 0.03 % nasal spray   Nasal   Place 2 sprays into the nose 2 (two) times daily as needed for rhinitis. For nasal drainage         . HYDROcodone-acetaminophen (NORCO/VICODIN) 5-325 MG per tablet   Oral   Take 1 tablet by mouth every 6 (six) hours as needed for pain.   15 tablet   0   . lisinopril-hydrochlorothiazide (PRINZIDE,ZESTORETIC) 20-25 MG per tablet   Oral   Take 1 tablet by mouth daily.         . promethazine (PHENERGAN) 25 MG tablet   Oral   Take 1 tablet (25 mg total) by mouth every 6 (six) hours as needed for nausea.   10 tablet   0     BP 141/91  Pulse 85  Temp(Src) 98.1 F (36.7 C)  (Oral)  Resp 18  SpO2 99%  LMP 04/27/2012  Physical Exam  Nursing note and vitals reviewed. Constitutional: She is oriented to person, place, and time. She appears well-developed and well-nourished.  HENT:  Head: Normocephalic and atraumatic.  Eyes: Conjunctivae and EOM are normal. Pupils are equal, round, and reactive to light.  Neck: Normal range of motion. Neck supple.  Cardiovascular: Normal rate, regular rhythm, normal heart sounds and intact distal pulses.   No murmur heard. Pulmonary/Chest: Effort normal. No respiratory distress. She has no wheezes.  Abdominal: Soft. Bowel sounds are normal. She exhibits no distension. There is tenderness. There is no rebound and no guarding.  Right lateral tenderness, no rebound or guarding  Genitourinary: Vagina normal and uterus normal.  External exam - normal, no lesions Speculum exam: Pt has some Teagarden discharge, no blood from cervix Bimanual exam: Patient has no CMT, no adnexal tenderness or fullness and cervical os is closed  Neurological: She is alert and oriented to person, place, and time.  Skin: Skin is warm and dry.    ED Course  Procedures (including critical care time)  Labs Reviewed  GC/CHLAMYDIA PROBE AMP  WET PREP, GENITAL  URINALYSIS, ROUTINE W REFLEX MICROSCOPIC  POCT PREGNANCY, URINE   Ct Abdomen Pelvis W Contrast  05/06/2012  *RADIOLOGY REPORT*  Clinical Data: 42 year old female with right flank pain starting 3 days ago.  Nausea.  History of appendectomy.  CT ABDOMEN AND PELVIS WITH CONTRAST  Technique:  Multidetector CT imaging of the abdomen and pelvis was performed following the standard protocol during bolus administration of intravenous contrast.  Contrast: OMNIPAQUE IOHEXOL 300 MG/ML  SOLN, 50mL OMNIPAQUE IOHEXOL 300 MG/ML  SOLN  Comparison: 06/04/2011 and earlier.  Findings: Lower lung volumes.  Lung base ground-glass opacity probably relate to atelectasis.  No pericardial or pleural effusion. Stable  visualized osseous structures.  Trace pelvic free fluid.  Uterus and adnexa appear stable within normal limits. Negative rectum.  No residual sigmoid colon inflammation.  Occasional left colon diverticula again noted.  Retained stool in the colon.  Oral contrast has not yet reached the terminal ileum.  No dilated small bowel.  Stable small fat containing umbilical hernia.  Small hiatal hernia.  Otherwise negative stomach and duodenum.  Liver, gallbladder, spleen, pancreas and adrenal glands are within normal limits.  Portal venous system and major arterial structures in the abdomen and pelvis are within normal limits.  No abdominal free fluid.  Normal kidneys.  Normal ureters.  Bladder unremarkable.  No lymphadenopathy.  IMPRESSION: Interval resolved sigmoid diverticulitis.  No acute or inflammatory findings identified in the abdomen or pelvis.  Mild lung base  atelectasis.   Original Report Authenticated By: Erskine Speed, M.D.      No diagnosis found.    MDM  Pt comes in with cc of abd pain. Pt has right sided abd pain, constant. CT done 2 days ago - no renal stones, no diverticulitis, no acute inflammatory or infectious process. Pt's physical exam was non peritoneal. Pelvic exam is also reassuring.  Will recheck Cr, UA and cluture urine. No fevers, chills, but positive nausea - so pyelonephritis possible.   Derwood Kaplan, MD 05/08/12 0981  Derwood Kaplan, MD 05/08/12 2219

## 2012-05-08 NOTE — ED Notes (Signed)
Pt states was here 2 days ago for nausea/vomiting, headache, R flank pain, states still having same symptoms.

## 2012-05-08 NOTE — ED Provider Notes (Signed)
Medical screening examination/treatment/procedure(s) were performed by non-physician practitioner and as supervising physician I was immediately available for consultation/collaboration.  Brode Sculley R. Leanah Kolander, MD 05/08/12 0726 

## 2012-10-11 ENCOUNTER — Encounter (HOSPITAL_COMMUNITY): Payer: Self-pay | Admitting: Emergency Medicine

## 2012-10-11 ENCOUNTER — Emergency Department (HOSPITAL_COMMUNITY)
Admission: EM | Admit: 2012-10-11 | Discharge: 2012-10-11 | Disposition: A | Discharge status: Home / Self Care | Payer: Medicaid Other | Attending: Emergency Medicine | Admitting: Emergency Medicine

## 2012-10-11 DIAGNOSIS — Z862 Personal history of diseases of the blood and blood-forming organs and certain disorders involving the immune mechanism: Secondary | ICD-10-CM | POA: Insufficient documentation

## 2012-10-11 DIAGNOSIS — M549 Dorsalgia, unspecified: Secondary | ICD-10-CM

## 2012-10-11 DIAGNOSIS — Z8619 Personal history of other infectious and parasitic diseases: Secondary | ICD-10-CM | POA: Insufficient documentation

## 2012-10-11 DIAGNOSIS — Z860100 Personal history of colon polyps, unspecified: Secondary | ICD-10-CM | POA: Insufficient documentation

## 2012-10-11 DIAGNOSIS — I10 Essential (primary) hypertension: Secondary | ICD-10-CM | POA: Insufficient documentation

## 2012-10-11 DIAGNOSIS — R109 Unspecified abdominal pain: Secondary | ICD-10-CM

## 2012-10-11 DIAGNOSIS — Z3202 Encounter for pregnancy test, result negative: Secondary | ICD-10-CM | POA: Insufficient documentation

## 2012-10-11 DIAGNOSIS — Z8639 Personal history of other endocrine, nutritional and metabolic disease: Secondary | ICD-10-CM | POA: Insufficient documentation

## 2012-10-11 DIAGNOSIS — Z79899 Other long term (current) drug therapy: Secondary | ICD-10-CM | POA: Insufficient documentation

## 2012-10-11 DIAGNOSIS — K219 Gastro-esophageal reflux disease without esophagitis: Secondary | ICD-10-CM | POA: Insufficient documentation

## 2012-10-11 DIAGNOSIS — R1032 Left lower quadrant pain: Secondary | ICD-10-CM | POA: Insufficient documentation

## 2012-10-11 DIAGNOSIS — M546 Pain in thoracic spine: Secondary | ICD-10-CM | POA: Insufficient documentation

## 2012-10-11 DIAGNOSIS — R1012 Left upper quadrant pain: Secondary | ICD-10-CM | POA: Insufficient documentation

## 2012-10-11 DIAGNOSIS — Z8742 Personal history of other diseases of the female genital tract: Secondary | ICD-10-CM | POA: Insufficient documentation

## 2012-10-11 LAB — COMPREHENSIVE METABOLIC PANEL
ALT: 10 U/L (ref 0–35)
AST: 12 U/L (ref 0–37)
Alkaline Phosphatase: 81 U/L (ref 39–117)
CO2: 25 mEq/L (ref 19–32)
Calcium: 9.4 mg/dL (ref 8.4–10.5)
Chloride: 101 mEq/L (ref 96–112)
GFR calc Af Amer: >90 mL/min (ref >90)
GFR calc non Af Amer: >90 mL/min (ref >90)
Glucose, Bld: 104 mg/dL — ABNORMAL HIGH (ref 70–99)
Sodium: 135 mEq/L (ref 135–145)
Total Bilirubin: 0.3 mg/dL (ref 0.3–1.2)

## 2012-10-11 LAB — CBC WITH DIFFERENTIAL/PLATELET
Basophils Absolute: 0 10*3/uL (ref 0.0–0.1)
Basophils Relative: 0 % (ref 0–1)
Eosinophils Absolute: 0.2 10*3/uL (ref 0.0–0.7)
MCH: 28.1 pg (ref 26.0–34.0)
MCHC: 33.2 g/dL (ref 30.0–36.0)
Monocytes Relative: 8 % (ref 3–12)
Neutro Abs: 3.8 10*3/uL (ref 1.7–7.7)
Neutrophils Relative %: 65 % (ref 43–77)
Platelets: 278 10*3/uL (ref 150–400)
RDW: 13.6 % (ref 11.5–15.5)

## 2012-10-11 LAB — URINALYSIS, ROUTINE W REFLEX MICROSCOPIC
Hgb urine dipstick: NEGATIVE
Nitrite: NEGATIVE
Protein, ur: NEGATIVE mg/dL
Specific Gravity, Urine: 1.014 (ref 1.005–1.030)
Urobilinogen, UA: 0.2 mg/dL (ref 0.0–1.0)

## 2012-10-11 LAB — LIPASE, BLOOD: Lipase: 22 U/L (ref 11–59)

## 2012-10-11 MED ORDER — HYDROCODONE-ACETAMINOPHEN 5-325 MG PO TABS: 1.0000 | ORAL_TABLET | Freq: Four times a day (QID) | ORAL | Status: DC | PRN

## 2012-10-11 MED ORDER — ONDANSETRON HCL 4 MG/2ML IJ SOLN
4.0000 mg | Freq: Once | INTRAMUSCULAR | Status: AC
  Administered 2012-10-11: 4 mg via INTRAVENOUS
  Filled 2012-10-11: qty 2

## 2012-10-11 MED ORDER — SODIUM CHLORIDE 0.9 % IV BOLUS (SEPSIS)
1000.0000 mL | Freq: Once | INTRAVENOUS | Status: AC
  Administered 2012-10-11: 1000 mL via INTRAVENOUS

## 2012-10-11 MED ORDER — HYDROMORPHONE HCL PF 1 MG/ML IJ SOLN
1.0000 mg | Freq: Once | INTRAMUSCULAR | Status: AC
  Administered 2012-10-11: 1 mg via INTRAVENOUS
  Filled 2012-10-11: qty 1

## 2012-10-11 MED ORDER — IBUPROFEN 600 MG PO TABS: 600.0000 mg | ORAL_TABLET | Freq: Three times a day (TID) | ORAL | Status: AC

## 2012-10-11 MED ORDER — DIAZEPAM 5 MG PO TABS: 5.0000 mg | ORAL_TABLET | Freq: Two times a day (BID) | ORAL | Status: AC

## 2012-10-11 MED ORDER — DIAZEPAM 5 MG PO TABS
5.0000 mg | ORAL_TABLET | Freq: Once | ORAL | Status: AC
  Administered 2012-10-11: 5 mg via ORAL
  Filled 2012-10-11: qty 1

## 2012-10-11 NOTE — ED Notes (Signed)
Pain medications given IM per Dr. Priscille Loveless verbal order.

## 2012-10-11 NOTE — ED Notes (Signed)
Unsuccessfully attempted to obtain blood for labs x2.RN Aram Beecham made aware

## 2012-10-11 NOTE — ED Notes (Signed)
IV nurse at bedside.

## 2012-10-11 NOTE — ED Notes (Signed)
Pain medications not given due to loss of IV access.  Nurse tried to establish access x 2 and Victorino Dike RN attempted IV using ultrasound.  Line established temporarily, but then when fluids hung, area began to swell and infiltrate.  Dr. Jeraldine Loots notified.

## 2012-10-11 NOTE — ED Provider Notes (Addendum)
CSN: [id number]     Arrival date & time 10/11/12  0849 History   First MD Initiated Contact with Patient 10/11/12 [phone]     Chief Complaint  Patient presents with  . Back Pain   (Consider location/radiation/quality/duration/timing/severity/associated sxs/prior Treatment) HPI Patient presents with new back and flank pain. Pain began yesterday, initially with pain in the left flank. Since onset patient has been persistent, and now includes the mid back. There is no new associated lower extremity weakness, lower extremity pain, incontinence, urinary complaints, fever, cough vomiting. And patient does have notable history of diverticulitis, hypertension. There has been no relief with OTC medication. No clear exacerbating factors. Notably, the patient does work in a [employer], and performs heavy labor during the day.  Past Medical History  Diagnosis Date  . Other constipation   . Abdominal pain, unspecified site   . Hypertension   . Migraine   . IBS (irritable bowel syndrome)   . Umbilical hernia   . Uterine fibroid   . Chlamydia   . Esophageal reflux   . Personal history of colonic polyps 09/12/2010    tubular adenoma  . Lactose intolerance   . Hiatal hernia   . Diverticulitis    Past Surgical History  Procedure Laterality Date  . Appendectomy    . Tubal ligation    . Appendectomy     Family History  Problem Relation Age of Onset  . Colon cancer Neg Hx   . Hypertension Mother   . Cancer Mother     not sure of type   History  Substance Use Topics  . Smoking status: Never Smoker   . Smokeless tobacco: Never Used  . Alcohol Use: Yes     Comment: occasionally   OB History   Grav Para Term Preterm Abortions TAB SAB Ect Mult Living   7 3 3  4 3 1   3      Review of Systems  Constitutional:       Per HPI, otherwise negative  HENT:       Per HPI, otherwise negative  Respiratory:       Per HPI, otherwise negative  Cardiovascular:       Per HPI,  otherwise negative  Gastrointestinal: Negative for vomiting.  Endocrine:       Negative aside from HPI  Genitourinary:       Neg aside from HPI   Musculoskeletal:       Per HPI, otherwise negative  Skin: Negative.   Neurological: Negative for syncope.    Allergies  Review of patient's allergies indicates no known allergies.  Home Medications   Current Outpatient Rx  Name  Route  Sig  Dispense  Refill  . albuterol (PROVENTIL HFA;VENTOLIN HFA) 108 (90 BASE) MCG/ACT inhaler   Inhalation   Inhale 2 puffs into the lungs every 4 (four) hours as needed for wheezing.   3.7 g   0   . esomeprazole (NEXIUM) 40 MG capsule   Oral   Take 40 mg by mouth daily as needed. For acid reflux         . HYDROcodone-acetaminophen (NORCO/VICODIN) 5-325 MG per tablet   Oral   Take 1 tablet by mouth every 6 (six) hours as needed for pain.   15 tablet   0   . ipratropium (ATROVENT) 0.03 % nasal spray   Nasal   Place 2 sprays into the nose 2 (two) times daily as needed for rhinitis. For nasal drainage         .  lisinopril-hydrochlorothiazide (PRINZIDE,ZESTORETIC) 20-25 MG per tablet   Oral   Take 1 tablet by mouth daily.         . promethazine (PHENERGAN) 25 MG tablet   Oral   Take 1 tablet (25 mg total) by mouth every 6 (six) hours as needed for nausea.   10 tablet   0    BP 152/97  Pulse 83  Temp(Src) 98.3 F (36.8 C) (Oral)  Resp 18  SpO2 100% Physical Exam  Nursing note and vitals reviewed. Constitutional: She is oriented to person, place, and time. She appears well-developed and well-nourished. No distress.  HENT:  Head: Normocephalic and atraumatic.  Eyes: Conjunctivae and EOM are normal.  Cardiovascular: Normal rate and regular rhythm.   Pulmonary/Chest: Effort normal and breath sounds normal. No stridor. No respiratory distress.  Abdominal: Normal appearance and bowel sounds are normal. She exhibits no distension. There is tenderness in the left upper quadrant and  left lower quadrant. There is no rigidity, no rebound, no guarding and no CVA tenderness.  Musculoskeletal: She exhibits no edema.       Arms: Neurological: She is alert and oriented to person, place, and time. She displays no atrophy and no tremor. No cranial nerve deficit or sensory deficit. She exhibits normal muscle tone. She displays no seizure activity. Coordination and gait normal.  Skin: Skin is warm and dry.  Psychiatric: She has a normal mood and affect.    ED Course  Procedures (including critical care time) Labs Review Labs Reviewed  COMPREHENSIVE METABOLIC PANEL  CBC WITH DIFFERENTIAL  URINALYSIS, ROUTINE W REFLEX MICROSCOPIC  LIPASE, BLOOD   Imaging Review No results found. Pulse oximetry 99% room air normal   1:18 PM On repeat exam the patient continues to be afebrile, with hemodynamically stable.  We reviewed all lab results.  We also reviewed her history of 4 CT abdomen pelvis within the past 4 years.  Absent current peritonitis, fever, leukocytosis, there is low suspicion for acute intra-abdominal pathology, though she does have a history of diverticulitis.  Additional analgesia provided, patient will be reassessed.  Marland Kitchenupdate: Patient explicitly denies similarity between this pain and her prior diverticulitis. MDM  No diagnosis found. Patient presents with back pain.  Notably the patient has multiple medical problems, including chronic abdominal pain, irritable bowel syndrome, and presents today after pain began following a period of heavy lifting at work. On exam she is awake alert, with no peritoneal abdomen, no fever, stable vital signs. Labs are reassuring.  I had a lengthy discussion with the patient and her companion about the possible causes of abdominal pain, back pain.  Absent distress, vomiting, diarrhea, and with her history of chronic pain, there is low suspicion for acute new intra-abdominal pathology. In addition, patient has a 4 CT abdomen pelvis within  the past 2 years, and there is some concern for increased radiation exposure given the absence of peritoneal findings, fever, leukocytosis. Patient was started on a course of muscle relaxants, analgesics and discharged in stable condition to follow up with her primary care physician.  Return precautions provided as well the    Gerhard Munch, MD 10/11/12 1408  Gerhard Munch, MD 10/11/12 [phone]

## 2012-10-11 NOTE — ED Notes (Signed)
IV team called. 

## 2012-10-11 NOTE — ED Notes (Signed)
Patient reports back pain and pain under her left ribs.  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  Patient alert and oriented, patient not able to take deep breaths due to pain.  Denies nausea, vomiting, fevers.

## 2012-10-12 ENCOUNTER — Emergency Department (HOSPITAL_COMMUNITY)
Admission: EM | Admit: 2012-10-12 | Discharge: 2012-10-12 | Disposition: A | Discharge status: Home / Self Care | Payer: Medicaid Other | Attending: Emergency Medicine | Admitting: Emergency Medicine

## 2012-10-12 ENCOUNTER — Encounter (HOSPITAL_COMMUNITY): Payer: Self-pay

## 2012-10-12 DIAGNOSIS — Z860100 Personal history of colon polyps, unspecified: Secondary | ICD-10-CM | POA: Insufficient documentation

## 2012-10-12 DIAGNOSIS — Y9289 Other specified places as the place of occurrence of the external cause: Secondary | ICD-10-CM | POA: Insufficient documentation

## 2012-10-12 DIAGNOSIS — Z8639 Personal history of other endocrine, nutritional and metabolic disease: Secondary | ICD-10-CM | POA: Insufficient documentation

## 2012-10-12 DIAGNOSIS — IMO0002 Reserved for concepts with insufficient information to code with codable children: Secondary | ICD-10-CM | POA: Insufficient documentation

## 2012-10-12 DIAGNOSIS — K219 Gastro-esophageal reflux disease without esophagitis: Secondary | ICD-10-CM | POA: Insufficient documentation

## 2012-10-12 DIAGNOSIS — Z8619 Personal history of other infectious and parasitic diseases: Secondary | ICD-10-CM | POA: Insufficient documentation

## 2012-10-12 DIAGNOSIS — Y9389 Activity, other specified: Secondary | ICD-10-CM | POA: Insufficient documentation

## 2012-10-12 DIAGNOSIS — Z862 Personal history of diseases of the blood and blood-forming organs and certain disorders involving the immune mechanism: Secondary | ICD-10-CM | POA: Insufficient documentation

## 2012-10-12 DIAGNOSIS — Z8742 Personal history of other diseases of the female genital tract: Secondary | ICD-10-CM | POA: Insufficient documentation

## 2012-10-12 DIAGNOSIS — I10 Essential (primary) hypertension: Secondary | ICD-10-CM | POA: Insufficient documentation

## 2012-10-12 DIAGNOSIS — X503XXA Overexertion from repetitive movements, initial encounter: Secondary | ICD-10-CM | POA: Insufficient documentation

## 2012-10-12 DIAGNOSIS — Z79899 Other long term (current) drug therapy: Secondary | ICD-10-CM | POA: Insufficient documentation

## 2012-10-12 DIAGNOSIS — T148XXA Other injury of unspecified body region, initial encounter: Secondary | ICD-10-CM

## 2012-10-12 DIAGNOSIS — R110 Nausea: Secondary | ICD-10-CM | POA: Insufficient documentation

## 2012-10-12 MED ORDER — OXYCODONE-ACETAMINOPHEN 5-325 MG PO TABS
1.0000 | ORAL_TABLET | Freq: Once | ORAL | Status: AC
  Administered 2012-10-12: 1 via ORAL
  Filled 2012-10-12: qty 1

## 2012-10-12 MED ORDER — METHOCARBAMOL 500 MG PO TABS
500.0000 mg | ORAL_TABLET | Freq: Once | ORAL | Status: AC
  Administered 2012-10-12: 500 mg via ORAL
  Filled 2012-10-12: qty 1

## 2012-10-12 MED ORDER — METHOCARBAMOL 500 MG PO TABS: 500.0000 mg | ORAL_TABLET | Freq: Two times a day (BID) | ORAL | Status: DC | PRN

## 2012-10-12 MED ORDER — KETOROLAC TROMETHAMINE 60 MG/2ML IM SOLN
60.0000 mg | Freq: Once | INTRAMUSCULAR | Status: AC
  Administered 2012-10-12: 60 mg via INTRAMUSCULAR
  Filled 2012-10-12: qty 2

## 2012-10-12 NOTE — Progress Notes (Signed)
[facility] ED CM noted pt visits x 4 in last 6 months CM spoke with pt on 10/11/12 (for back pain) and was informed her pcp was the [facility].  During 10/12/12 visit for back pain pt confirms this is the pcp but report her medicaid is connected to her children and [facility] of her medical concerns Pt reports her lisinopril had been initiated by [facility] but she has not been seen at [facility] "in a while" CM discussed with pt that medicaid pt's are generally assigned pcps depending on type and reason for the medicaid coverage (in this case her underage children) CM encouraged pt to go to DSS to discuss her need for a pcp and request another assigned provider.  Pt informed Cm she will f/u with DSS.  CM discussed the importance of a pcp for managing ongoing medical concerns and a Games developer to specialists as needed Pt voiced understanding CM provided pt with a list of available [place]icaid accepting providers pending she has DSS staff assist to change to one of the providers in [place] data system.  DSS contact information also provided  She also voiced concern of not wanting her children to be seen by [place] [facility] providers CM also re discussed that she would have to get DSS staff to offer another medicaid provider if [facility] is not her preference of provider She voiced understanding

## 2012-10-12 NOTE — ED Notes (Signed)
Pt alert and oriented x4. Respirations even and unlabored, bilateral symmetrical rise and fall of chest. Skin warm and dry. In no acute distress. Denies needs.   

## 2012-10-12 NOTE — ED Provider Notes (Signed)
CSN: [id number]     Arrival date & time 10/12/12  0846 History   First MD Initiated Contact with Patient 10/12/12 [phone]     Chief Complaint  Patient presents with  . Back Pain  . Abdominal Pain   (Consider location/radiation/quality/duration/timing/severity/associated sxs/prior Treatment) HPI Patient seen yesterday in the emergency department for right back pain. Pain started after working 2 days ago. 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 She is right-handed. The patient had labs drawn yesterday of which were normal. She was treated for muscle strain and discharged home. Patient does have history of diverticulitis. She complains of no abdominal pain at this time. She had mild brief nausea earlier but denies vomiting or diarrhea. She denies fevers or chills. She states she's been taking medication as prescribed but with little improvement. States she's not receive a work note yesterday. Past Medical History  Diagnosis Date  . Other constipation   . Abdominal pain, unspecified site   . Hypertension   . Migraine   . IBS (irritable bowel syndrome)   . Umbilical hernia   . Uterine fibroid   . Chlamydia   . Esophageal reflux   . Personal history of colonic polyps 09/12/2010    tubular adenoma  . Lactose intolerance   . Hiatal hernia   . Diverticulitis    Past Surgical History  Procedure Laterality Date  . Appendectomy    . Tubal ligation    . Appendectomy     Family History  Problem Relation Age of Onset  . Colon cancer Neg Hx   . Hypertension Mother   . Cancer Mother     not sure of type   History  Substance Use Topics  . Smoking status: Never Smoker   . Smokeless tobacco: Never Used  . Alcohol Use: Yes     Comment: occasionally   OB History   Grav Para Term Preterm Abortions TAB SAB Ect Mult Living   7 3 3  4 3 1   3      Review of Systems  Constitutional: Negative for fever and chills.  HENT: Negative for neck pain.   Respiratory: Negative for shortness of  breath.   Cardiovascular: Negative for chest pain.  Gastrointestinal: Positive for nausea. Negative for vomiting, abdominal pain, diarrhea and constipation.  Genitourinary: Negative for dysuria and flank pain.  Musculoskeletal: Positive for myalgias and back pain.  Skin: Negative for rash and wound.  Neurological: Negative for dizziness, weakness, numbness and headaches.  All other systems reviewed and are negative.    Allergies  Review of patient's allergies indicates no known allergies.  Home Medications   Current Outpatient Rx  Name  Route  Sig  Dispense  Refill  . diazepam (VALIUM) 5 MG tablet   Oral   Take 1 tablet (5 mg total) by mouth 2 (two) times daily.   10 tablet   0   . esomeprazole (NEXIUM) 40 MG capsule   Oral   Take 40 mg by mouth daily as needed. For acid reflux         . HYDROcodone-acetaminophen (NORCO/VICODIN) 5-325 MG per tablet   Oral   Take 1 tablet by mouth every 6 (six) hours as needed for pain.   15 tablet   0   . ibuprofen (ADVIL,MOTRIN) 600 MG tablet   Oral   Take 1 tablet (600 mg total) by mouth 3 (three) times daily.   12 tablet   0   . lisinopril-hydrochlorothiazide (PRINZIDE,ZESTORETIC) 20-25 MG per  tablet   Oral   Take 1 tablet by mouth daily.          BP 122/81  Pulse 87  Temp(Src) 98.1 F (36.7 C) (Oral)  Resp 20  SpO2 95%  LMP 09/19/2012 Physical Exam  Nursing note and vitals reviewed. Constitutional: She is oriented to person, place, and time. She appears well-developed and well-nourished. No distress.  HENT:  Head: Normocephalic and atraumatic.  Mouth/Throat: Oropharynx is clear and moist.  Eyes: EOM are normal. Pupils are equal, round, and reactive to light.  Neck: Normal range of motion. Neck supple.  Cardiovascular: Normal rate and regular rhythm.   Pulmonary/Chest: Effort normal and breath sounds normal. No respiratory distress. She has no wheezes. She has no rales. She exhibits no tenderness.  Abdominal: Soft.  Bowel sounds are normal. She exhibits no distension and no mass. There is no tenderness. There is no rebound and no guarding.  Musculoskeletal: Normal range of motion. She exhibits tenderness. She exhibits no edema.  Patient has tenderness to palpation over the right rhomboid and supraspinatus muscles of the thoracic back. There is no evidence of trauma. No deformity. She has full range of motion of both shoulders. Distal pulses are intact  Neurological: She is alert and oriented to person, place, and time.  Patient moves all extremities without deficit. There is no gross sensory deficit.  Skin: Skin is warm and dry. No rash noted. No erythema.  Psychiatric: She has a normal mood and affect. Her behavior is normal.    ED Course  Procedures (including critical care time) Labs Review Labs Reviewed - No data to display Imaging Review No results found.  MDM  I do not suspect an intra-abdominal process. The patient complains of no abdominal pain. Her labs performed yesterday were within normal limits including her Spaeth blood cell count and liver function test. Patient's pain is completely reproduced with palpation over the muscles of her thoracic back. Exam and history are consistent with muscle strain. We'll treat in the emergency department and discharged home with a work excuse note. I reevaluated the patient. She states that her thoracic back pain is improving she again denies having any current abdominal pain. When questioned about her abdominal pain she states the pain is episodic and cramping in nature. She has not had a bowel movement for several days. She states this is definitely different than her previous episodes of diverticulitis which were gradual and constant. I again examined the patient's abdomen she has no abdominal tenderness whatsoever.  Patient states she felt better. We'll discharge home with return precautions.  Loren Racer, MD 10/12/12 1144

## 2012-10-12 NOTE — ED Notes (Signed)
Per md pt allowed to have water.

## 2012-10-12 NOTE — ED Notes (Signed)
Pt escorted to discharge window. Pt verbalized understanding discharge instructions. In no acute distress. Pt reports she will wait in lobby for her ride.

## 2012-10-12 NOTE — ED Notes (Signed)
Pt c/o LLQ abdominal pain and mid R side back pain x 2 days and nausea x 1 day.  Pain score 10/10.  Pt was seen at [facility] yesterday for same complaint.  Sts she has been taking prescribed medication w/o relief.

## 2012-12-16 ENCOUNTER — Emergency Department (HOSPITAL_COMMUNITY)
Admission: EM | Admit: 2012-12-16 | Discharge: 2012-12-16 | Disposition: A | Discharge status: Home / Self Care | Payer: Medicaid Other | Source: Home / Self Care

## 2012-12-16 ENCOUNTER — Encounter (HOSPITAL_COMMUNITY): Payer: Self-pay | Admitting: Emergency Medicine

## 2012-12-16 DIAGNOSIS — M79601 Pain in right arm: Secondary | ICD-10-CM

## 2012-12-16 DIAGNOSIS — M778 Other enthesopathies, not elsewhere classified: Secondary | ICD-10-CM

## 2012-12-16 DIAGNOSIS — M79609 Pain in unspecified limb: Secondary | ICD-10-CM

## 2012-12-16 DIAGNOSIS — M65839 Other synovitis and tenosynovitis, unspecified forearm: Secondary | ICD-10-CM

## 2012-12-16 NOTE — ED Notes (Signed)
Right arm pain, hand pain.  Movement of right thumb causes pain in hand at base of thumb, and now pain is moving up right hand.  No known injury.  Patient is right handed

## 2012-12-16 NOTE — ED Notes (Signed)
Patient and child sharing treatment room, both being evaluated .

## 2012-12-16 NOTE — ED Provider Notes (Signed)
CSN: [id number]     Arrival date & time 12/16/12  1534 History   First MD Initiated Contact with Patient 12/16/12 1706     Chief Complaint  Patient presents with  . Arm Pain   (Consider location/radiation/quality/duration/timing/severity/associated sxs/prior Treatment) HPI Comments: 42 year old female presents with pain to the right, wrist and distal forearm. Pain started approximately 2 weeks ago. She denies any known injury. She currently does not work he is unaware of any particular repetitive movements. Pain radiates from the extensor surface of the proximal thumb along the extensor pollicis longus to the forearm.   Past Medical History  Diagnosis Date  . Other constipation   . Abdominal pain, unspecified site   . Hypertension   . Migraine   . IBS (irritable bowel syndrome)   . Umbilical hernia   . Uterine fibroid   . Chlamydia   . Esophageal reflux   . Personal history of colonic polyps 09/12/2010    tubular adenoma  . Lactose intolerance   . Hiatal hernia   . Diverticulitis    Past Surgical History  Procedure Laterality Date  . Appendectomy    . Tubal ligation    . Appendectomy     Family History  Problem Relation Age of Onset  . Colon cancer Neg Hx   . Hypertension Mother   . Cancer Mother     not sure of type   History  Substance Use Topics  . Smoking status: Never Smoker   . Smokeless tobacco: Never Used  . Alcohol Use: Yes     Comment: occasionally   OB History   Grav Para Term Preterm Abortions TAB SAB Ect Mult Living   7 3 3  4 3 1   3      Review of Systems  Constitutional: Negative for fever, chills and activity change.  HENT: Negative.   Respiratory: Negative.   Cardiovascular: Negative.   Musculoskeletal:       As per HPI  Skin: Negative for color change, pallor and rash.  Neurological: Negative.     Allergies  Review of patient's allergies indicates no known allergies.  Home Medications   Current Outpatient Rx  Name  Route  Sig   Dispense  Refill  . esomeprazole (NEXIUM) 40 MG capsule   Oral   Take 40 mg by mouth daily as needed. For acid reflux         . HYDROcodone-acetaminophen (NORCO/VICODIN) 5-325 MG per tablet   Oral   Take 1 tablet by mouth every 6 (six) hours as needed for pain.   15 tablet   0   . lisinopril-hydrochlorothiazide (PRINZIDE,ZESTORETIC) 20-25 MG per tablet   Oral   Take 1 tablet by mouth daily.         . methocarbamol (ROBAXIN) 500 MG tablet   Oral   Take 1 tablet (500 mg total) by mouth 2 (two) times daily as needed.   20 tablet   0    BP 139/95  Pulse 85  Temp(Src) 98 F (36.7 C) (Oral)  Resp 19  SpO2 98%  LMP 11/15/2012 Physical Exam  Nursing note and vitals reviewed. Constitutional: She is oriented to person, place, and time. She appears well-developed and well-nourished. No distress.  HENT:  Head: Normocephalic and atraumatic.  Eyes: EOM are normal.  Neck: Normal range of motion. Neck supple.  Pulmonary/Chest: Effort normal. No respiratory distress.  Musculoskeletal: Normal range of motion. She exhibits tenderness. She exhibits no edema.  Positive Finklestein's sign, Tenderness along  the extensor pollicus longus. No deformity or discoloration  Neurological: She is alert and oriented to person, place, and time. No cranial nerve deficit.  Skin: Skin is warm and dry.  Psychiatric: She has a normal mood and affect.    ED Course  Procedures (including critical care time) Labs Review Labs Reviewed - No data to display Imaging Review No results found.      MDM   1. Arm pain, right   2. Tendinitis of thumb   3. Tendinitis of right wrist      Ice, rest, wrist splint for 1 week, maybe more, Limit use of the thumb  Hayden Rasmussen, NP 12/16/12 1735

## 2012-12-26 NOTE — ED Provider Notes (Signed)
Medical screening examination/treatment/procedure(s) were performed by resident physician or non-physician practitioner and as supervising physician I was immediately available for consultation/collaboration.   Barkley Bruns MD.   Linna Hoff, MD 12/26/12 2142

## 2013-01-02 ENCOUNTER — Encounter (HOSPITAL_COMMUNITY): Payer: Self-pay | Admitting: Emergency Medicine

## 2013-01-02 ENCOUNTER — Emergency Department (HOSPITAL_COMMUNITY)
Admission: EM | Admit: 2013-01-02 | Discharge: 2013-01-03 | Disposition: A | Discharge status: Home / Self Care | Payer: Medicaid Other | Attending: Emergency Medicine | Admitting: Emergency Medicine

## 2013-01-02 ENCOUNTER — Emergency Department (HOSPITAL_COMMUNITY): Payer: Medicaid Other

## 2013-01-02 DIAGNOSIS — Z860100 Personal history of colon polyps, unspecified: Secondary | ICD-10-CM | POA: Insufficient documentation

## 2013-01-02 DIAGNOSIS — R059 Cough, unspecified: Secondary | ICD-10-CM | POA: Insufficient documentation

## 2013-01-02 DIAGNOSIS — T465X5A Adverse effect of other antihypertensive drugs, initial encounter: Secondary | ICD-10-CM | POA: Insufficient documentation

## 2013-01-02 DIAGNOSIS — Z8742 Personal history of other diseases of the female genital tract: Secondary | ICD-10-CM | POA: Insufficient documentation

## 2013-01-02 DIAGNOSIS — R51 Headache: Secondary | ICD-10-CM | POA: Insufficient documentation

## 2013-01-02 DIAGNOSIS — R05 Cough: Secondary | ICD-10-CM | POA: Insufficient documentation

## 2013-01-02 DIAGNOSIS — Z79899 Other long term (current) drug therapy: Secondary | ICD-10-CM | POA: Insufficient documentation

## 2013-01-02 DIAGNOSIS — Z8619 Personal history of other infectious and parasitic diseases: Secondary | ICD-10-CM | POA: Insufficient documentation

## 2013-01-02 DIAGNOSIS — K219 Gastro-esophageal reflux disease without esophagitis: Secondary | ICD-10-CM | POA: Insufficient documentation

## 2013-01-02 DIAGNOSIS — R079 Chest pain, unspecified: Secondary | ICD-10-CM

## 2013-01-02 DIAGNOSIS — R0789 Other chest pain: Secondary | ICD-10-CM | POA: Insufficient documentation

## 2013-01-02 DIAGNOSIS — I10 Essential (primary) hypertension: Secondary | ICD-10-CM | POA: Insufficient documentation

## 2013-01-02 MED ORDER — KETOROLAC TROMETHAMINE 30 MG/ML IJ SOLN
30.0000 mg | Freq: Once | INTRAMUSCULAR | Status: AC
  Administered 2013-01-02: 30 mg via INTRAVENOUS
  Filled 2013-01-02: qty 1

## 2013-01-02 MED ORDER — SODIUM CHLORIDE 0.9 % IV BOLUS (SEPSIS)
1000.0000 mL | Freq: Once | INTRAVENOUS | Status: AC
  Administered 2013-01-02: 1000 mL via INTRAVENOUS

## 2013-01-02 MED ORDER — METOCLOPRAMIDE HCL 5 MG/ML IJ SOLN
10.0000 mg | Freq: Once | INTRAMUSCULAR | Status: AC
  Administered 2013-01-02: 10 mg via INTRAVENOUS
  Filled 2013-01-02: qty 2

## 2013-01-02 NOTE — ED Notes (Signed)
Patient is alert and oriented x3.  She is complaining of a Cough, chest pain, headache and lower back pain.  She was recently started on lisinopril when the cough began about 3 days ago.

## 2013-01-02 NOTE — ED Notes (Signed)
Pt started on BP pills and took them for 3 days and started coughing that has been going on for 5 days. Pt states that th cough makes her back chest and head hurt. Pt reports taking night quil to help with cough.

## 2013-01-02 NOTE — ED Provider Notes (Addendum)
TIME SEEN: 11:18 PM  CHIEF COMPLAINT: Headache, chest pain, dry cough  HPI: Patient is a 42 year old female with a history of hypertension who has been lisinopril for the past 2 weeks who began having a dry cough 3 days ago. She states that she has coughed so much that she has diffuse, throbbing headache and diffuse chest pain. She denies any shortness of breath. No neck pain or neck stiffness. No numbness, tingling or focal weakness. She states she is having chills and diarrhea. No recent sick contacts. She is on her flu vaccination. No recent international travel.  ROS: See HPI Constitutional: no fever  Eyes: no drainage  ENT: no runny nose   Cardiovascular:   chest pain  Resp: no SOB  GI: no vomiting GU: no dysuria Integumentary: no rash  Allergy: no hives  Musculoskeletal: no leg swelling  Neurological: no slurred speech ROS otherwise negative  PAST MEDICAL HISTORY/PAST SURGICAL HISTORY:  Past Medical History  Diagnosis Date  . Other constipation   . Abdominal pain, unspecified site   . Hypertension   . Migraine   . IBS (irritable bowel syndrome)   . Umbilical hernia   . Uterine fibroid   . Chlamydia   . Esophageal reflux   . Personal history of colonic polyps 09/12/2010    tubular adenoma  . Lactose intolerance   . Hiatal hernia   . Diverticulitis     MEDICATIONS:  Prior to Admission medications   Medication Sig Start Date End Date Taking? Authorizing Provider  esomeprazole (NEXIUM) 40 MG capsule Take 40 mg by mouth daily as needed. For acid reflux   Yes Historical Provider, MD  ibuprofen (ADVIL,MOTRIN) 200 MG tablet Take 200 mg by mouth every 6 (six) hours as needed (pain).   Yes Historical Provider, MD  lisinopril-hydrochlorothiazide (PRINZIDE,ZESTORETIC) 20-25 MG per tablet Take 1 tablet by mouth daily.   Yes Historical Provider, MD    ALLERGIES:  No Known Allergies  SOCIAL HISTORY:  History  Substance Use Topics  . Smoking status: Never Smoker   .  Smokeless tobacco: Never Used  . Alcohol Use: Yes     Comment: occasionally    FAMILY HISTORY: Family History  Problem Relation Age of Onset  . Colon cancer Neg Hx   . Hypertension Mother   . Cancer Mother     not sure of type    EXAM: BP 145/92  Pulse 96  Temp(Src) 98.1 F (36.7 C) (Oral)  Resp 18  SpO2 99%  LMP 11/26/2012 CONSTITUTIONAL: Alert and oriented and responds appropriately to questions. Well-appearing; well-nourished HEAD: Normocephalic EYES: Conjunctivae clear, PERRL ENT: normal nose; no rhinorrhea; moist mucous membranes; pharynx without lesions noted NECK: Supple, no meningismus, no LAD  CARD: RRR; S1 and S2 appreciated; no murmurs, no clicks, no rubs, no gallops RESP: Normal chest excursion without splinting or tachypnea; breath sounds clear and equal bilaterally; no wheezes, no rhonchi, no rales,  ABD/GI: Normal bowel sounds; non-distended; soft, non-tender, no rebound, no guarding BACK:  The back appears normal and is non-tender to palpation, there is no CVA tenderness EXT: Normal ROM in all joints; non-tender to palpation; no edema; normal capillary refill; no cyanosis    SKIN: Normal color for age and race; warm NEURO: Moves all extremities equally; cranial nerves II through XII intact, sensation to light touch intact diffusely PSYCH: The patient's mood and manner are appropriate. Grooming and personal hygiene are appropriate.  MEDICAL DECISION MAKING: Patient with headache and chest pain after coughing for the  past 3 days. She has a dry cough she contributes to taking lisinopril. She does have mild chills and diarrhea but no other infectious symptoms. Her lungs are clear. No respiratory distress. She is hemodynamically stable. No risk factors for pulmonary embolus. Chest pain is only present with coughing. Suspect symptoms secondary to taking lisinopril. We'll however obtain chest x-ray to rule out infiltrate, edema. Will give Toradol, Reglan and IV fluids for  symptom relief.  ED PROGRESS: Patient's labs are reassuring. Troponin negative. Chest x-ray is clear. Patient reports her symptoms are better after IV fluids, Toradol and Reglan. We'll discharge home with return precautions and PCP followup. Have instructed patient to hold her lisinopril. Her doctor has started her on a new blood pressure medication but she cannot recall the name. We'll also send with Rx for cough syrup for symptom control at home that she may get some rest. Patient verbalizes understanding and is comfortable plan.   EKG Interpretation    Date/Time:  Monday January 02 2013 22:50:07 EST Ventricular Rate:  101 PR Interval:  146 QRS Duration: 66 QT Interval:  360 QTC Calculation: 466 R Axis:   -11 Text Interpretation:  Sinus tachycardia Nonspecific T wave abnormality Abnormal ECG No significant change since last tracing Confirmed by POLLINA  MD, CHRISTOPHER ([id number]) on 01/02/2013 11:08:24 PM             Layla Maw Lionardo Haze, DO 01/03/13 0055  Layla Maw Montel Vanderhoof, DO 01/03/13 9604

## 2013-01-03 LAB — BASIC METABOLIC PANEL
CO2: 27 mEq/L (ref 19–32)
Calcium: 9.2 mg/dL (ref 8.4–10.5)
Chloride: 102 mEq/L (ref 96–112)
Creatinine, Ser: 0.84 mg/dL (ref 0.50–1.10)
GFR calc Af Amer: >90 mL/min (ref >90)
Sodium: 138 mEq/L (ref 135–145)

## 2013-01-03 LAB — CBC WITH DIFFERENTIAL/PLATELET
Basophils Absolute: 0 10*3/uL (ref 0.0–0.1)
Basophils Relative: 0 % (ref 0–1)
Eosinophils Relative: 4 % (ref 0–5)
Lymphocytes Relative: 35 % (ref 12–46)
MCHC: 33.4 g/dL (ref 30.0–36.0)
MCV: 85.2 fL (ref 78.0–100.0)
Monocytes Absolute: 0.6 10*3/uL (ref 0.1–1.0)
Neutro Abs: 4.1 10*3/uL (ref 1.7–7.7)
Platelets: 297 10*3/uL (ref 150–400)
RDW: 13 % (ref 11.5–15.5)
WBC: 7.7 10*3/uL (ref 4.0–10.5)

## 2013-01-03 LAB — POCT I-STAT TROPONIN I: Troponin i, poc: 0.01 ng/mL (ref 0.00–0.08)

## 2013-01-03 MED ORDER — HYDROCOD POLST-CHLORPHEN POLST 10-8 MG/5ML PO LQCR: 5.0000 mL | Freq: Every evening | ORAL | Status: DC | PRN

## 2013-01-03 NOTE — ED Notes (Signed)
Patient is alert and oriented x3.  She was given DC instructions and follow up visit instructions.  Patient gave verbal understanding. She was DC ambulatory under her own power to home.  V/S stable.  He was not showing any signs of distress on DC 

## 2013-01-12 ENCOUNTER — Encounter (HOSPITAL_COMMUNITY): Payer: Self-pay | Admitting: Emergency Medicine

## 2013-01-12 ENCOUNTER — Emergency Department (HOSPITAL_COMMUNITY)
Admission: EM | Admit: 2013-01-12 | Discharge: 2013-01-12 | Disposition: A | Discharge status: Home / Self Care | Payer: Medicaid Other | Attending: Emergency Medicine | Admitting: Emergency Medicine

## 2013-01-12 DIAGNOSIS — J40 Bronchitis, not specified as acute or chronic: Secondary | ICD-10-CM

## 2013-01-12 DIAGNOSIS — J209 Acute bronchitis, unspecified: Secondary | ICD-10-CM | POA: Insufficient documentation

## 2013-01-12 DIAGNOSIS — K219 Gastro-esophageal reflux disease without esophagitis: Secondary | ICD-10-CM | POA: Insufficient documentation

## 2013-01-12 DIAGNOSIS — Z862 Personal history of diseases of the blood and blood-forming organs and certain disorders involving the immune mechanism: Secondary | ICD-10-CM | POA: Insufficient documentation

## 2013-01-12 DIAGNOSIS — Z8639 Personal history of other endocrine, nutritional and metabolic disease: Secondary | ICD-10-CM | POA: Insufficient documentation

## 2013-01-12 DIAGNOSIS — I10 Essential (primary) hypertension: Secondary | ICD-10-CM | POA: Insufficient documentation

## 2013-01-12 DIAGNOSIS — Z8742 Personal history of other diseases of the female genital tract: Secondary | ICD-10-CM | POA: Insufficient documentation

## 2013-01-12 DIAGNOSIS — Z8619 Personal history of other infectious and parasitic diseases: Secondary | ICD-10-CM | POA: Insufficient documentation

## 2013-01-12 DIAGNOSIS — Z79899 Other long term (current) drug therapy: Secondary | ICD-10-CM | POA: Insufficient documentation

## 2013-01-12 DIAGNOSIS — Z860100 Personal history of colon polyps, unspecified: Secondary | ICD-10-CM | POA: Insufficient documentation

## 2013-01-12 MED ORDER — AZITHROMYCIN 250 MG PO TABS: 250.0000 mg | ORAL_TABLET | Freq: Every day | ORAL | Status: AC

## 2013-01-12 MED ORDER — DIAZEPAM 2 MG PO TABS: 2.0000 mg | ORAL_TABLET | Freq: Two times a day (BID) | ORAL | Status: DC

## 2013-01-12 MED ORDER — AZITHROMYCIN 250 MG PO TABS: 500.0000 mg | ORAL_TABLET | Freq: Once | ORAL | Status: DC

## 2013-01-12 MED ORDER — DIAZEPAM 2 MG PO TABS: 2.0000 mg | ORAL_TABLET | Freq: Once | ORAL | Status: DC

## 2013-01-12 NOTE — ED Notes (Signed)
Pt states she has had a cough for 2 weeks. Pt states it is a dry cough. Pt also states she has a headache also. Pt has been taking Motrin for headache, but it is not helping much. Pt with no acute distress.

## 2013-01-12 NOTE — ED Provider Notes (Signed)
CSN: [id number]     Arrival date & time 01/12/13  2148 History   First MD Initiated Contact with Patient 01/12/13 2310     Chief Complaint  Patient presents with  . Cough  . Headache   (Consider location/radiation/quality/duration/timing/severity/associated sxs/prior Treatment) HPI Patient presents to the ED for cough that she has had for two weeks. She has tried many of the OTC medications but they have not helped. Her cough comes in waves and she sometimes has post tussive vomiting. She is on Lisinopril and is afraid that is the cause of her cough because her PCP said that it could cause cough. She has also had decrease in energy and headache. Denies fever. She has had no chest pains, nausea, diarrhea, abdominal pain, SOB,.  Past Medical History  Diagnosis Date  . Other constipation   . Abdominal pain, unspecified site   . Hypertension   . Migraine   . IBS (irritable bowel syndrome)   . Umbilical hernia   . Uterine fibroid   . Chlamydia   . Esophageal reflux   . Personal history of colonic polyps 09/12/2010    tubular adenoma  . Lactose intolerance   . Hiatal hernia   . Diverticulitis    Past Surgical History  Procedure Laterality Date  . Appendectomy    . Tubal ligation    . Appendectomy     Family History  Problem Relation Age of Onset  . Colon cancer Neg Hx   . Hypertension Mother   . Cancer Mother     not sure of type   History  Substance Use Topics  . Smoking status: Never Smoker   . Smokeless tobacco: Never Used  . Alcohol Use: Yes     Comment: occasionally   OB History   Grav Para Term Preterm Abortions TAB SAB Ect Mult Living   7 3 3  4 3 1   3      Review of Systems The patient denies anorexia, fever, weight loss,, vision loss, decreased hearing, hoarseness, chest pain, syncope, dyspnea on exertion, peripheral edema, balance deficits, hemoptysis, abdominal pain, melena, hematochezia, severe indigestion/heartburn, hematuria, incontinence, genital sores,  muscle weakness, suspicious skin lesions, transient blindness, difficulty walking, depression, unusual weight change, abnormal bleeding, enlarged lymph nodes, angioedema, and breast masses.  Allergies  Review of patient's allergies indicates no known allergies.  Home Medications   Current Outpatient Rx  Name  Route  Sig  Dispense  Refill  . azithromycin (ZITHROMAX) 250 MG tablet   Oral   Take 1 tablet (250 mg total) by mouth daily. 1 every day until finished.   6 tablet   0   . chlorpheniramine-HYDROcodone (TUSSIONEX PENNKINETIC ER) 10-8 MG/5ML LQCR   Oral   Take 5 mLs by mouth at bedtime as needed for cough.   115 mL   0   . diazepam (VALIUM) 2 MG tablet   Oral   Take 1 tablet (2 mg total) by mouth 2 (two) times daily.   6 tablet   0   . esomeprazole (NEXIUM) 40 MG capsule   Oral   Take 40 mg by mouth daily as needed. For acid reflux         . ibuprofen (ADVIL,MOTRIN) 200 MG tablet   Oral   Take 200 mg by mouth every 6 (six) hours as needed (pain).         Marland Kitchen lisinopril-hydrochlorothiazide (PRINZIDE,ZESTORETIC) 20-25 MG per tablet   Oral   Take 1 tablet by mouth daily.  BP 122/59  Pulse 99  Temp(Src) 98.5 F (36.9 C) (Oral)  Resp 16  SpO2 98%  LMP 12/19/2012 Physical Exam  Nursing note and vitals reviewed. Constitutional: She appears well-developed and well-nourished. No distress.  HENT:  Head: Normocephalic and atraumatic.  Nose: Rhinorrhea present. Right sinus exhibits maxillary sinus tenderness and frontal sinus tenderness. Left sinus exhibits maxillary sinus tenderness and frontal sinus tenderness.  Eyes: Pupils are equal, round, and reactive to light.  Neck: Normal range of motion. Neck supple.  Cardiovascular: Normal rate and regular rhythm.   Pulmonary/Chest: Effort normal and breath sounds normal.  Abdominal: Soft.  Neurological: She is alert.  Skin: Skin is warm and dry.    ED Course  Procedures (including critical care time) Labs  Review Labs Reviewed - No data to display Imaging Review No results found.  EKG Interpretation   None       MDM   1. Bronchitis    Rx Azithromycin.  42 y.o.Michaelene Dutan Lingenfelter's evaluation in the Emergency Department is complete. It has been determined that no acute conditions requiring further emergency intervention are present at this time. The patient/guardian have been advised of the diagnosis and plan. We have discussed signs and symptoms that warrant return to the ED, such as changes or worsening in symptoms.  Vital signs are stable at discharge. Filed Vitals:   01/12/13 2233  BP: 122/59  Pulse: 99  Temp: 98.5 F (36.9 C)  Resp: 16    Patient/guardian has voiced understanding and agreed to follow-up with the PCP or specialist.     Dorthula Matas, PA-C 01/15/13 2231

## 2013-01-16 NOTE — ED Provider Notes (Signed)
Medical screening examination/treatment/procedure(s) were performed by non-physician practitioner and as supervising physician I was immediately available for consultation/collaboration.    Vida Roller, MD 01/16/13 [phone]

## 2013-02-21 ENCOUNTER — Ambulatory Visit (INDEPENDENT_AMBULATORY_CARE_PROVIDER_SITE_OTHER): Payer: Medicaid Other | Admitting: Gastroenterology

## 2013-02-21 ENCOUNTER — Encounter: Payer: Self-pay | Admitting: Gastroenterology

## 2013-02-21 VITALS — BP 120/90 | HR 72 | Ht 61.0 in | Wt 215.8 lb

## 2013-02-21 DIAGNOSIS — R109 Unspecified abdominal pain: Secondary | ICD-10-CM

## 2013-02-21 DIAGNOSIS — R059 Cough, unspecified: Secondary | ICD-10-CM

## 2013-02-21 DIAGNOSIS — K219 Gastro-esophageal reflux disease without esophagitis: Secondary | ICD-10-CM

## 2013-02-21 DIAGNOSIS — K625 Hemorrhage of anus and rectum: Secondary | ICD-10-CM

## 2013-02-21 DIAGNOSIS — K429 Umbilical hernia without obstruction or gangrene: Secondary | ICD-10-CM

## 2013-02-21 DIAGNOSIS — Z8719 Personal history of other diseases of the digestive system: Secondary | ICD-10-CM

## 2013-02-21 DIAGNOSIS — R05 Cough: Secondary | ICD-10-CM

## 2013-02-21 DIAGNOSIS — Z86010 Personal history of colonic polyps: Secondary | ICD-10-CM

## 2013-02-21 MED ORDER — NA SULFATE-K SULFATE-MG SULF 17.5-3.13-1.6 GM/177ML PO SOLN: ORAL | Status: DC

## 2013-02-21 MED ORDER — DEXLANSOPRAZOLE 60 MG PO CPDR: 60.0000 mg | DELAYED_RELEASE_CAPSULE | Freq: Every day | ORAL | Status: DC

## 2013-02-21 NOTE — Patient Instructions (Signed)
You have been scheduled for an endoscopy and colonoscopy with propofol. Please follow the written instructions given to you at your visit today. Please pick up your prep at the pharmacy within the next 1-3 days. If you use inhalers (even only as needed), please bring them with you on the day of your procedure. Your physician has requested that you go to www.startemmi.com and enter the access code given to you at your visit today. This web site gives a general overview about your procedure. However, you should still follow specific instructions given to you by our office regarding your preparation for the procedure.  We have given you samples of the following medication to take: Dexilant 60 mg, please take one capsule by mouth once daily

## 2013-02-21 NOTE — Progress Notes (Signed)
History of Present Illness:  This is a 43 year old African American female whose has several years now of a nonproductive cough probably from acid reflux.  She only uses Nexium on a when necessary basis.  She apparently seen numerous physicians for cough without any definite diagnosis been determined.  She does not smoke or use ethanol.  Other problems include periodic bright red blood per rectum,and she did have removal of a rather large adenomatous polyp removed with colonoscopy in the spring of 2012.  She was hospitalized in 2013 with acute diverticulitis.  Her colonoscopy previously did show a left colon diverticulosis, and she has a known umbilical hernia .  Other GI problems include gluten sensitivity, lactose intolerance, history of IBS.  She currently has mild constipation and goes the bathroom every 2-3 days.  Her rectal bleeding is mostly locally with wiping.  She's not apparently had any significant anemia other metabolic problems.  She does have essential hypertension, but apparently has not taken her medicines correctly, also has a Proventil inhaler which she uses when necessary.  She status post tubal ligation and appendectomy.  Family history is noncontributory.  I have reviewed this patient's present history, medical and surgical past history, allergies and medications.     ROS:   All systems were reviewed and are negative unless otherwise stated in the HPI.  No Known Allergies Outpatient Prescriptions Prior to Visit  Medication Sig Dispense Refill  . chlorpheniramine-HYDROcodone (TUSSIONEX PENNKINETIC ER) 10-8 MG/5ML LQCR Take 5 mLs by mouth at bedtime as needed for cough.  115 mL  0  . diazepam (VALIUM) 2 MG tablet Take 1 tablet (2 mg total) by mouth 2 (two) times daily.  6 tablet  0  . esomeprazole (NEXIUM) 40 MG capsule Take 40 mg by mouth daily as needed. For acid reflux      . ibuprofen (ADVIL,MOTRIN) 200 MG tablet Take 200 mg by mouth every 6 (six) hours as needed (pain).      Marland Kitchen  lisinopril-hydrochlorothiazide (PRINZIDE,ZESTORETIC) 20-25 MG per tablet Take 1 tablet by mouth daily.       No facility-administered medications prior to visit.   Past Medical History  Diagnosis Date  . Other constipation   . Abdominal pain, unspecified site   . Hypertension   . Migraine   . IBS (irritable bowel syndrome)   . Umbilical hernia   . Uterine fibroid   . Chlamydia   . Esophageal reflux   . Personal history of colonic polyps 09/12/2010    tubular adenoma  . Lactose intolerance   . Hiatal hernia   . Diverticulitis    Past Surgical History  Procedure Laterality Date  . Appendectomy    . Tubal ligation    . Appendectomy     History   Social History  . Marital Status: Single    Spouse Name: N/A    Number of Children: 3  . Years of Education: N/A   Occupational History  . Unemployed    Social History Main Topics  . Smoking status: Never Smoker   . Smokeless tobacco: Never Used  . Alcohol Use: Yes     Comment: occasionally  . Drug Use: No  . Sexual Activity: Yes    Birth Control/ Protection: Surgical   Other Topics Concern  . None   Social History Narrative  . None   Family History  Problem Relation Age of Onset  . Colon cancer Neg Hx   . Hypertension Mother   . Cancer Mother  not sure of type       Physical Exam: Blood pressure 120/90, pulse 72 and regular weight 215 pounds with a BMI of 40.80. General well developed well nourished patient in no acute distress, appearing their stated age Eyes PERRLA, no icterus, fundoscopic exam per opthamologist Skin no lesions noted Neck supple, no adenopathy, no thyroid enlargement, no tenderness Chest clear to percussion and auscultation.... no wheezes or rhonchi anteriorly or posteriorly.  Exam of the neck is unremarkable. Heart no significant murmurs, gallops or rubs noted Abdomen no hepatosplenomegaly masses or tenderness, BS normal.  Umbilical hernia present with some mild tenderness to palpation.   Her abdomen is somewhat obese to examine but there no definite masses or tenderness. Rectal inspection normal no fissures, or fistulae noted.  No masses or tenderness on digital exam. Stool guaiac negative.  Posterior skin tags noted with some palpable rectal hyperplastic papillae.  I cannot feel a rectal masses, and there is formed stool which is guaiac negative. Extremities no acute joint lesions, edema, phlebitis or evidence of cellulitis. Neurologic patient oriented x 3, cranial nerves intact, no focal neurologic deficits noted. Psychological mental status normal and normal affect.  Assessment and plan: This patient was placed to have followup colonoscopy one year after her last colonoscopy, and we will schedule that ASAP.  I suspect the rectal bleeding is locally from perhaps internal hemorrhoids, but certainly does not sound like diverticular hemorrhage, and I doubt she has recurrent colon polyps after her exam within the last 3 years showing no other polyps except for large adenoma removed endoscopically.  She does have an umbilical hernia which may need surgical consultation.  Her cough is undoubtedly related to her rather severe symptoms of acid reflux with typical regurgitation, and burning substernal chest pain and incorrect use of PPI.  She also is obese and needs to lose weight and follow standard antireflux maneuvers.  I've scheduled her for endoscopy at the time of her colonoscopy, and switched her to Dexilant 60 mg every morning.

## 2013-02-21 NOTE — Telephone Encounter (Signed)
Error

## 2013-02-22 ENCOUNTER — Ambulatory Visit (AMBULATORY_SURGERY_CENTER): Payer: Medicaid Other | Admitting: Gastroenterology

## 2013-02-22 ENCOUNTER — Encounter: Payer: Medicaid Other | Admitting: Gastroenterology

## 2013-02-22 ENCOUNTER — Encounter: Payer: Self-pay | Admitting: Gastroenterology

## 2013-02-22 VITALS — BP 154/99 | HR 64 | Temp 98.0°F | Resp 15 | Ht 61.0 in | Wt 215.0 lb

## 2013-02-22 DIAGNOSIS — K625 Hemorrhage of anus and rectum: Secondary | ICD-10-CM

## 2013-02-22 DIAGNOSIS — K219 Gastro-esophageal reflux disease without esophagitis: Secondary | ICD-10-CM

## 2013-02-22 DIAGNOSIS — R109 Unspecified abdominal pain: Secondary | ICD-10-CM

## 2013-02-22 DIAGNOSIS — R141 Gas pain: Secondary | ICD-10-CM

## 2013-02-22 DIAGNOSIS — R142 Eructation: Secondary | ICD-10-CM

## 2013-02-22 DIAGNOSIS — R05 Cough: Secondary | ICD-10-CM

## 2013-02-22 DIAGNOSIS — Z86010 Personal history of colonic polyps: Secondary | ICD-10-CM

## 2013-02-22 DIAGNOSIS — R140 Abdominal distension (gaseous): Secondary | ICD-10-CM

## 2013-02-22 DIAGNOSIS — R198 Other specified symptoms and signs involving the digestive system and abdomen: Secondary | ICD-10-CM

## 2013-02-22 DIAGNOSIS — R059 Cough, unspecified: Secondary | ICD-10-CM

## 2013-02-22 DIAGNOSIS — K5730 Diverticulosis of large intestine without perforation or abscess without bleeding: Secondary | ICD-10-CM

## 2013-02-22 DIAGNOSIS — R143 Flatulence: Secondary | ICD-10-CM

## 2013-02-22 MED ORDER — DEXLANSOPRAZOLE 60 MG PO CPDR: 60.0000 mg | DELAYED_RELEASE_CAPSULE | Freq: Every day | ORAL | Status: DC

## 2013-02-22 MED ORDER — SODIUM CHLORIDE 0.9 % IV SOLN: 500.0000 mL | INTRAVENOUS | Status: DC

## 2013-02-22 NOTE — Progress Notes (Addendum)
Pt..experienced some dizziness upon sitting up. Pt. Instructed by doctor Sharlett Iles to follow up with primary doctor concerning blood pressure. Pt. Hob elevated and allowed time to adjust, before sitting up. Pt. Stable upon discharge.

## 2013-02-22 NOTE — Patient Instructions (Signed)
YOU HAD AN ENDOSCOPIC PROCEDURE TODAY AT THE [facility]: Refer to the procedure report that was given to you for any specific questions about what was found during the examination.  If the procedure report does not answer your questions, please call your gastroenterologist to clarify.  If you requested that your care partner not be given the details of your procedure findings, then the procedure report has been included in a sealed envelope for you to review at your convenience later.  YOU SHOULD EXPECT: Some feelings of bloating in the abdomen. Passage of more gas than usual.  Walking can help get rid of the air that was put into your GI tract during the procedure and reduce the bloating. If you had a lower endoscopy (such as a colonoscopy or flexible sigmoidoscopy) you may notice spotting of blood in your stool or on the toilet paper. If you underwent a bowel prep for your procedure, then you may not have a normal bowel movement for a few days.  DIET: Your first meal following the procedure should be a light meal and then it is ok to progress to your normal diet.  A half-sandwich or bowl of soup is an example of a good first meal.  Heavy or fried foods are harder to digest and may make you feel nauseous or bloated.  Likewise meals heavy in dairy and vegetables can cause extra gas to form and this can also increase the bloating.  Drink plenty of fluids but you should avoid alcoholic beverages for 24 hours.  ACTIVITY: Your care partner should take you home directly after the procedure.  You should plan to take it easy, moving slowly for the rest of the day.  You can resume normal activity the day after the procedure however you should NOT DRIVE or use heavy machinery for 24 hours (because of the sedation medicines used during the test).    SYMPTOMS TO REPORT IMMEDIATELY: A gastroenterologist can be reached at any hour.  During normal business hours, 8:30 AM to 5:00 PM Monday through Friday,  call [phone].  After hours and on weekends, please call the GI answering service at [phone] who will take a message and have the physician on call contact you.   Following lower endoscopy (colonoscopy or flexible sigmoidoscopy):  Excessive amounts of blood in the stool  Significant tenderness or worsening of abdominal pains  Swelling of the abdomen that is new, acute  Fever of 100F or higher  Following upper endoscopy (EGD)  Vomiting of blood or coffee ground material  New chest pain or pain under the shoulder blades  Painful or persistently difficult swallowing  New shortness of breath  Fever of 100F or higher  Black, tarry-looking stools  FOLLOW UP: If any biopsies were taken you will be contacted by phone or by letter within the next 1-3 weeks.  Call your gastroenterologist if you have not heard about the biopsies in 3 weeks.  Our staff will call the home number listed on your records the next business day following your procedure to check on you and address any questions or concerns that you may have at that time regarding the information given to you following your procedure. This is a courtesy call and so if there is no answer at the home number and we have not heard from you through the emergency physician on call, we will assume that you have returned to your regular daily activities without incident.  SIGNATURES/CONFIDENTIALITY: You and/or your care   partner have signed paperwork which will be entered into your electronic medical record.  These signatures attest to the fact that that the information above on your After Visit Summary has been reviewed and is understood.  Full responsibility of the confidentiality of this discharge information lies with you and/or your care-partner.   Resume medications. Information given on GERD, Diverticulosis,Hemorrhoids and high fiber diet with discharge instructions.

## 2013-02-22 NOTE — Progress Notes (Signed)
Per the pt her cough is non-productive.  Her primary care md, Dr. Augusto Gamble, is aware of the cough and feels it is coming from her blood pressure med per the pt.  She also said she has not taken her b/p med for one month d/t cough and making her feel bad.  Per the pt she has a appointment tomorrow with Dr. Ardelia Mems and I advised her to talk about b/p med and cough.  Pt was notified her b/p elevated today.  maw

## 2013-02-22 NOTE — Progress Notes (Signed)
A/ox3 pleased with MAC, report to Berlinda Last RN

## 2013-02-22 NOTE — Op Note (Signed)
[place]   [address]. [place], [id number]   COLONOSCOPY PROCEDURE REPORT  PATIENT: Briana Wade, Briana Wade  MR#: [id number] BIRTHDATE: 1970/11/06 , 42  yrs. old GENDER: Female ENDOSCOPIST: Sable Feil, MD, [facility] REFERRED BY: PROCEDURE DATE:  02/22/2013 PROCEDURE:   Colonoscopy, screening First Screening Colonoscopy - Avg.  risk and is 50 yrs.  old or older - No.  Prior Negative Screening - Now for repeat screening. N/A ASA CLASS:   Class II INDICATIONS:Rectal Bleeding and Patient's personal history of adenomatous colon polyps. MEDICATIONS: propofol (Diprivan) 150mg  IV  DESCRIPTION OF PROCEDURE:   After the risks benefits and alternatives of the procedure were thoroughly explained, informed consent was obtained.  A digital rectal exam revealed no abnormalities of the rectum.   The LB [id number]  endoscope was introduced through the anus and advanced to the cecum, which was identified by both the appendix and ileocecal valve. No adverse events experienced.   The quality of the prep was excellent, using MoviPrep  The instrument was then slowly withdrawn as the colon was fully examined.      COLON FINDINGS: Mild diverticulosis was noted in the descending colon and sigmoid colon.   The colon was otherwise normal.  There was no diverticulosis, inflammation, polyps or cancers unless previously stated.  Retroflexed views revealed internal hemorrhoids. The time to cecum=2 minutes 25 seconds.  Withdrawal time=6 minutes 08 seconds.  The scope was withdrawn and the procedure completed. COMPLICATIONS: There were no complications.  ENDOSCOPIC IMPRESSION: 1.   Mild diverticulosis was noted in the descending colon and sigmoid colon 2.   The colon was otherwise normal ...hemorrhoids noted,no polyps noted.  RECOMMENDATIONS: 1.  Continue current medications 2.  High fiber diet with liberal fluid intake.   eSigned:  Sable Feil, MD, [facility] 02/22/2013  8:54 AM   cc:

## 2013-02-22 NOTE — Op Note (Signed)
[place]  [address]. [place], [address]   ENDOSCOPY PROCEDURE REPORT  PATIENT: Briana, Wade  MR#: [id number] BIRTHDATE: 09-Feb-1970 , 42  yrs. old GENDER: Female ENDOSCOPIST:Analysa Nutting Consuello Masse, MD, [facility] REFERRED BY: PROCEDURE DATE:  02/22/2013 PROCEDURE:   EGD, diagnostic ASA CLASS:    Class II INDICATIONS: History of esophageal reflux, Chest pain, and refractory cough. MEDICATION: There was residual sedation effect present from prior procedure and propofol (Diprivan) 150mg  IV TOPICAL ANESTHETIC:   Cetacaine Spray  DESCRIPTION OF PROCEDURE:   After the risks and benefits of the procedure were explained, informed consent was obtained.  The LB [id number]  endoscope was introduced through the mouth  and advanced to the second portion of the duodenum .  The instrument was slowly withdrawn as the mucosa was fully examined.      DUODENUM: The duodenal mucosa showed no abnormalities in the bulb and second portion of the duodenum.  STOMACH: The mucosa of the stomach appeared normal.  Linear erosions and severe free reflux noted.  There is a 4 cm Hiatial Hernia present also.   n] see pictures for documentation. There is a thickened crycoid membrane between the vocal cords consistent with granularity from chronic GERD.   The scope was then withdrawn from the patient and the procedure completed.  COMPLICATIONS: There were no complications.   ENDOSCOPIC IMPRESSION: 1.   The duodenal mucosa showed no abnormalities in the bulb and second portion of the duodenum 2.   The mucosa of the stomach appeared normal 3.   Linear erosions and severe free reflux noted.  There is a 4 cm Hiatial Hernia present also.  Her cough and reactive airway disease is certainly in large part related to untreated GERD.   RECOMMENDATIONS: 1.  Continue PPI 2.  Continue current meds 3. F/U Dr.Pyrtle in 6 weeks    _______________________________ eSigned:  Sable Feil, MD, [facility] 02/22/2013 9:09 AM   antireflux   PATIENT NAME:  Briana, Wade MR#: [id number]

## 2013-02-22 NOTE — Progress Notes (Signed)
Notified Osvaldo Angst, CRNA pt's blood pressure 162/117 left arm and 148/93 leg cuff pre-procedure.  Also that the pt c/o cough for 2 weeks.  Temperature 98.0 this am.  maw

## 2013-02-23 ENCOUNTER — Telehealth: Payer: Self-pay | Admitting: *Deleted

## 2013-02-23 NOTE — Telephone Encounter (Signed)
Via fax from pharmacy, patient needs prior auth for Dexilant Patient per chart has tried Omeprazole, Aciphex, and Nexium Spoke with Rodena Piety at [facility] office for prior [phone] Approval number is [id number]

## 2013-02-23 NOTE — Telephone Encounter (Signed)
Lm on vm of number given in admitting yesterday to return call if problems questions or concerns. ewm

## 2013-02-24 ENCOUNTER — Other Ambulatory Visit: Payer: Self-pay | Admitting: *Deleted

## 2013-02-27 ENCOUNTER — Telehealth: Payer: Self-pay | Admitting: *Deleted

## 2013-02-27 MED ORDER — PANTOPRAZOLE SODIUM 40 MG PO TBEC: 40.0000 mg | DELAYED_RELEASE_TABLET | Freq: Every day | ORAL | Status: DC

## 2013-02-27 NOTE — Telephone Encounter (Signed)
[facility] called, Dexilant still did not go through I called Medicaid back and spoke with Abigail Butts Per Abigail Butts Medicaid denied Dexilant because patient needs to try Pantoprazole for thirty days and fail it. Sent RX to pharmacy, left message for patient to call office back

## 2013-02-27 NOTE — Telephone Encounter (Signed)
Patient called back and I advised her what Medicaid said. I advised patient I will send in Pantoprazole

## 2013-03-01 ENCOUNTER — Encounter: Payer: Medicaid Other | Admitting: Gastroenterology

## 2013-03-07 ENCOUNTER — Encounter (HOSPITAL_COMMUNITY): Payer: Self-pay | Admitting: Emergency Medicine

## 2013-03-07 ENCOUNTER — Emergency Department (HOSPITAL_COMMUNITY)
Admission: EM | Admit: 2013-03-07 | Discharge: 2013-03-07 | Disposition: A | Discharge status: Home / Self Care | Payer: Medicaid Other | Attending: Emergency Medicine | Admitting: Emergency Medicine

## 2013-03-07 DIAGNOSIS — I10 Essential (primary) hypertension: Secondary | ICD-10-CM | POA: Insufficient documentation

## 2013-03-07 DIAGNOSIS — M25539 Pain in unspecified wrist: Secondary | ICD-10-CM | POA: Insufficient documentation

## 2013-03-07 DIAGNOSIS — Z79899 Other long term (current) drug therapy: Secondary | ICD-10-CM | POA: Insufficient documentation

## 2013-03-07 DIAGNOSIS — M79632 Pain in left forearm: Secondary | ICD-10-CM

## 2013-03-07 DIAGNOSIS — Z860100 Personal history of colon polyps, unspecified: Secondary | ICD-10-CM | POA: Insufficient documentation

## 2013-03-07 DIAGNOSIS — Z862 Personal history of diseases of the blood and blood-forming organs and certain disorders involving the immune mechanism: Secondary | ICD-10-CM | POA: Insufficient documentation

## 2013-03-07 DIAGNOSIS — K219 Gastro-esophageal reflux disease without esophagitis: Secondary | ICD-10-CM | POA: Insufficient documentation

## 2013-03-07 DIAGNOSIS — Z8742 Personal history of other diseases of the female genital tract: Secondary | ICD-10-CM | POA: Insufficient documentation

## 2013-03-07 DIAGNOSIS — Z8619 Personal history of other infectious and parasitic diseases: Secondary | ICD-10-CM | POA: Insufficient documentation

## 2013-03-07 DIAGNOSIS — J45909 Unspecified asthma, uncomplicated: Secondary | ICD-10-CM | POA: Insufficient documentation

## 2013-03-07 MED ORDER — NAPROXEN 500 MG PO TABS: 500.0000 mg | ORAL_TABLET | Freq: Two times a day (BID) | ORAL | Status: DC

## 2013-03-07 NOTE — Discharge Instructions (Signed)
Recommend Naproxen and bracing for symptoms. Follow up with neurology to schedule a nerve conduction study. Follow up with your primary doctor as well. Return if symptoms worsen.  Wrist Pain Wrist injuries are frequent in adults and children. A sprain is an injury to the ligaments that hold your bones together. A strain is an injury to muscle or muscle cord-like structures (tendons) from stretching or pulling. Generally, when wrists are moderately tender to touch following a fall or injury, a break in the bone (fracture) may be present. Most wrist sprains or strains are better in 3 to 5 days, but complete healing may take several weeks. HOME CARE INSTRUCTIONS   Put ice on the injured area.  Put ice in a plastic bag.  Place a towel between your skin and the bag.  Leave the ice on for 15-20 minutes, 03-04 times a day, for the first 2 days.  Keep your arm raised above the level of your heart whenever possible to reduce swelling and pain.  Rest the injured area for at least 48 hours or as directed by your caregiver.  If a splint or elastic bandage has been applied, use it for as long as directed by your caregiver or until seen by a caregiver for a follow-up exam.  Only take over-the-counter or prescription medicines for pain, discomfort, or fever as directed by your caregiver.  Keep all follow-up appointments. You may need to follow up with a specialist or have follow-up X-rays. Improvement in pain level is not a guarantee that you did not fracture a bone in your wrist. The only way to determine whether or not you have a broken bone is by X-ray. SEEK IMMEDIATE MEDICAL CARE IF:   Your fingers are swollen, very red, [place], or cold and blue.  Your fingers are numb or tingling.  You have increasing pain.  You have difficulty moving your fingers. MAKE SURE YOU:   Understand these instructions.  Will watch your condition.  Will get help right away if you are not doing well or get  worse. Document Released: 10/22/2004 Document Revised: 04/06/2011 Document Reviewed: 03/05/2010 [facility] Patient Information 2014 [place].

## 2013-03-07 NOTE — ED Provider Notes (Signed)
CSN: [id number]     Arrival date & time 03/07/13  1750 History  This chart was scribed for non-physician practitioner, Antonietta Breach, PA-C,working with Threasa Beards, MD, by Marlowe Kays, ED Scribe.  This patient was seen in room [place] and the patient's care was started at 8:14 PM.  Chief Complaint  Patient presents with  . Numbness   The history is provided by the patient. No language interpreter was used.   HPI Comments:  Briana Wade is a 43 y.o. female who presents to the Emergency Department complaining of mild intermittent "numbness" of her left hand that radiates up her arm stopping short of her shoulder that started earlier today; numbness associated with a pain of 9/10 on pain scale. Pt reports that she has had the same symptoms before but they resolved on its own. She states movement of the extremity makes the pain worse. She denies taking anything for the pain. She denies any injury, neck pain or left elbow pain. She denies fever, vision loss, hearing loss, speaking or swallowing difficulty, nausea and vomiting. Pt states she has a PCP but has not seen him for this issue. She reports h/o HTN, diverticulitis, and tendonitis of the right hand/wrist. She states she does not work and does not do any repetitive motion. Pt states she is right-hand dominant.   Past Medical History  Diagnosis Date  . Other constipation   . Abdominal pain, unspecified site   . Hypertension   . Migraine   . IBS (irritable bowel syndrome)   . Umbilical hernia   . Uterine fibroid   . Chlamydia   . Esophageal reflux   . Personal history of colonic polyps 09/12/2010    tubular adenoma  . Lactose intolerance   . Hiatal hernia   . Diverticulitis   . Allergy   . Anemia   . Asthma    Past Surgical History  Procedure Laterality Date  . Appendectomy    . Tubal ligation    . Appendectomy     Family History  Problem Relation Age of Onset  . Colon cancer Neg Hx   . Esophageal cancer Neg Hx    . Rectal cancer Neg Hx   . Stomach cancer Neg Hx   . Hypertension Mother   . Cancer Mother     not sure of type   History  Substance Use Topics  . Smoking status: Never Smoker   . Smokeless tobacco: Never Used  . Alcohol Use: Yes     Comment: occasionally   OB History   Grav Para Term Preterm Abortions TAB SAB Ect Mult Living   7 3 3  4 3 1   3      Review of Systems  Constitutional: Negative for fever.  HENT: Negative for trouble swallowing.   Eyes: Negative for visual disturbance.  Gastrointestinal: Negative for nausea and vomiting.  Musculoskeletal: Positive for myalgias (LUE).  Neurological: Positive for numbness.  All other systems reviewed and are negative.   Allergies  Review of patient's allergies indicates no known allergies.  Home Medications   Current Outpatient Rx  Name  Route  Sig  Dispense  Refill  . dexlansoprazole (DEXILANT) 60 MG capsule   Oral   Take 60 mg by mouth daily.         Marland Kitchen ibuprofen (ADVIL,MOTRIN) 200 MG tablet   Oral   Take 200 mg by mouth every 6 (six) hours as needed (pain).         Marland Kitchen  LOSARTAN POTASSIUM PO   Oral   Take 1 tablet by mouth daily.         Marland Kitchen EXPIRED: dexlansoprazole (DEXILANT) 60 MG capsule   Oral   Take 1 capsule (60 mg total) by mouth daily.   30 capsule   6   . pantoprazole (PROTONIX) 40 MG tablet   Oral   Take 1 tablet (40 mg total) by mouth daily.   90 tablet   3    Triage Vitals: BP 128/70  Pulse 91  Temp(Src) 98.4 F (36.9 C) (Oral)  Resp 17  SpO2 99%  LMP 02/07/2013  Physical Exam  Nursing note and vitals reviewed. Constitutional: She is oriented to person, place, and time. She appears well-developed and well-nourished. No distress.  HENT:  Head: Normocephalic and atraumatic.  Eyes: Conjunctivae and EOM are normal. No scleral icterus.  Neck: Normal range of motion. Neck supple.  No tenderness to palpation of the cervical midline. No bony deformities or step-offs palpated.   Cardiovascular: Normal rate, regular rhythm and intact distal pulses.   Distal radial pulses 2+ bilaterally. Capillary refill normal.  Pulmonary/Chest: Effort normal. No respiratory distress.  Musculoskeletal: Normal range of motion. She exhibits no edema and no tenderness.       Left elbow: Normal.       Left wrist: She exhibits swelling (mild just proximal to wrist on dorsal aspect of LUE). She exhibits normal range of motion, no tenderness, no effusion, no crepitus and no deformity.       Left upper arm: Normal.       Left forearm: Normal.       Left hand: Normal.  No erythema, pitting edema, red linear streaking, or heat to touch appreciated.  Neurological: She is alert and oriented to person, place, and time. She has normal reflexes. No cranial nerve deficit.  GCS 15. Speech is oriented. No gross sensory deficits appreciated. Patient moves extremities without ataxia. No cranial nerve deficits appreciated on physical exam. She is ambulatory with normal gait. Patient noted to have normal grip strength bilaterally as well as normal strength against resistance in bilateral upper extremities; she reflexively provides 5/5 strength against resistance of arms b/l but quickly switches to provide poor effort with her LUE, I believe, to feign weakness.  Skin: Skin is warm and dry. No rash noted. She is not diaphoretic. No erythema. No pallor.  Psychiatric: She has a normal mood and affect. Her behavior is normal.    ED Course  Procedures (including critical care time)  COORDINATION OF CARE: 8:23 PM- Advised pt to rest and ice the area. Will prescribe an NSAID. Will also speak with Dr. Canary Brim for any other indication for additional course of treatment. Pt verbalizes understanding and agrees to plan.  Labs Review Labs Reviewed - No data to display Imaging Review No results found.  EKG Interpretation   None       MDM   Final diagnoses:  Pain of left forearm    Uncomplicated pain of  left forearm. Pain atraumatic and onset. Patient describes the pain as a "numbness" associated with pain score of 9/10. Patient is neurovascularly intact on physical exam with normal sensation to light touch in her left upper extremity. I appreciate normal strength against resistance in her bilateral upper extremities as well as normal and equal grip strength. Suspect pain to be musculoskeletal in origin. Do not believe further emergent workup is indicated; however, will refer patient to neurologist to have nerve conduction study completed.  In the interim, have advised naproxen. Have also placed patient in a wrist splint for stability. Primary care followup advised and return precautions discussed. Patient agreeable to plan with no unaddressed concerns.  I personally performed the services described in this documentation, which was scribed in my presence. The recorded information has been reviewed and is accurate.    Antonietta Breach, PA-C 03/10/13 [phone]

## 2013-03-07 NOTE — ED Notes (Addendum)
Pt c/o intermittent lower L arm numbness starting this morning.  Pain score 9/10.  Pt sts "I've had this before, but I didn't see anyone and it resolved on its own."  Denies injury.

## 2013-03-11 NOTE — ED Provider Notes (Signed)
Medical screening examination/treatment/procedure(s) were performed by non-physician practitioner and as supervising physician I was immediately available for consultation/collaboration.  EKG Interpretation   None        Threasa Beards, MD 03/11/13 [phone]

## 2013-04-05 ENCOUNTER — Encounter: Payer: Self-pay | Admitting: Internal Medicine

## 2013-04-07 ENCOUNTER — Encounter: Payer: Self-pay | Admitting: Internal Medicine

## 2013-04-07 ENCOUNTER — Ambulatory Visit (INDEPENDENT_AMBULATORY_CARE_PROVIDER_SITE_OTHER): Payer: Medicaid Other | Admitting: Internal Medicine

## 2013-04-07 VITALS — BP 110/76 | HR 84 | Ht 60.25 in | Wt 218.2 lb

## 2013-04-07 DIAGNOSIS — K5730 Diverticulosis of large intestine without perforation or abscess without bleeding: Secondary | ICD-10-CM

## 2013-04-07 DIAGNOSIS — K449 Diaphragmatic hernia without obstruction or gangrene: Secondary | ICD-10-CM

## 2013-04-07 DIAGNOSIS — Z86010 Personal history of colonic polyps: Secondary | ICD-10-CM

## 2013-04-07 DIAGNOSIS — K219 Gastro-esophageal reflux disease without esophagitis: Secondary | ICD-10-CM

## 2013-04-07 DIAGNOSIS — K2100 Gastro-esophageal reflux disease with esophagitis, without bleeding: Secondary | ICD-10-CM

## 2013-04-07 MED ORDER — PANTOPRAZOLE SODIUM 40 MG PO TBEC: 40.0000 mg | DELAYED_RELEASE_TABLET | Freq: Every day | ORAL | Status: DC

## 2013-04-07 NOTE — Patient Instructions (Signed)
We have sent the following medications to your pharmacy for you to pick up at your convenience: protonix 40 mg daily Follow up with Dr. Hilarie Fredrickson in 6 months     Diet for Gastroesophageal Reflux Disease, Adult Reflux (acid reflux) is when acid from your stomach flows up into the esophagus. When acid comes in contact with the esophagus, the acid causes irritation and soreness (inflammation) in the esophagus. When reflux happens often or so severely that it causes damage to the esophagus, it is called gastroesophageal reflux disease (GERD). Nutrition therapy can help ease the discomfort of GERD. FOODS OR DRINKS TO AVOID OR LIMIT  Smoking or chewing tobacco. Nicotine is one of the most potent stimulants to acid production in the gastrointestinal tract.  Caffeinated and decaffeinated coffee and black tea.  Regular or low-calorie carbonated beverages or energy drinks (caffeine-free carbonated beverages are allowed).   Strong spices, such as black pepper, Veneziano pepper, red pepper, cayenne, curry powder, and chili powder.  Peppermint or spearmint.  Chocolate.  High-fat foods, including meats and fried foods. Extra added fats including oils, butter, salad dressings, and nuts. Limit these to less than 8 tsp per day.  Fruits and vegetables if they are not tolerated, such as citrus fruits or tomatoes.  Alcohol.  Any food that seems to aggravate your condition. If you have questions regarding your diet, call your caregiver or a registered dietitian. OTHER THINGS THAT MAY HELP GERD INCLUDE:   Eating your meals slowly, in a relaxed setting.  Eating 5 to 6 small meals per day instead of 3 large meals.  Eliminating food for a period of time if it causes distress.  Not lying down until 3 hours after eating a meal.  Keeping the head of your bed raised 6 to 9 inches (15 to 23 cm) by using a foam wedge or blocks under the legs of the bed. Lying flat may make symptoms worse.  Being physically  active. Weight loss may be helpful in reducing reflux in overweight or obese adults.  Wear loose fitting clothing EXAMPLE MEAL PLAN This meal plan is approximately 2,000 calories based on [email or web address] meal planning guidelines. Breakfast   cup cooked oatmeal.  1 cup strawberries.  1 cup low-fat milk.  1 oz almonds. Snack  1 cup cucumber slices.  6 oz yogurt (made from low-fat or fat-free milk). Lunch  2 slice whole-wheat bread.  2 oz sliced [place].  2 tsp mayonnaise.  1 cup blueberries.  1 cup snap peas. Snack  6 whole-wheat crackers.  1 oz string cheese. Dinner   cup brown rice.  1 cup mixed veggies.  1 tsp olive oil.  3 oz grilled fish. Document Released: 01/12/2005 Document Revised: 04/06/2011 Document Reviewed: 11/28/2010 [facility] Patient Information 2014 [place], [place].

## 2013-04-07 NOTE — Progress Notes (Signed)
Subjective:    Patient ID: Briana Wade, female    DOB: 08-Oct-1970, 43 y.o.   MRN: [id number]  HPI Briana Wade is a 43 year old female with a past medical history of GERD with reflux esophagitis, hiatal hernia, adenomatous colon polyp, diverticulosis with diverticulitis, umbilical hernia, IBS, lactose intolerance, hypertension and migraines who is seen in followup. She was previously followed by Dr. Sharlett Iles, prior to his retirement. She is here today with her significant other.  She had EGD colonoscopy on 02/22/2013.  EGD revealed reflux esophagitis with linear erosions, 4 cm hiatal hernia. Normal stomach and examined duodenum. Colonoscopy to the cecum was normal except for left-sided diverticulosis. Internal hemorrhoids were seen.  Today she reports she is feeling well. She is continue pantoprazole 40 mg daily. She denies reflux symptoms including heartburn and dysphagia. No nausea or vomiting. Appetite has been good. She very occasionally has some left upper epigastric pain which comes and goes.  This has been a problem for many years. It is not debilitating. Bowel movements have been regular without blood or melena. She denies significant diarrhea or constipation.  Review of Systems As per history of present illness, otherwise negative  Current Medications, Allergies, Past Medical History, Past Surgical History, Family History and Social History were reviewed in [employer] record.     Objective:   Physical Exam BP 110/76  Pulse 84  Ht 5' 0.25" (1.53 m)  Wt 218 lb 4 oz (98.998 kg)  BMI 42.29 kg/m2  LMP 03/17/2013 Constitutional: Well-developed and well-nourished. No distress. HEENT: Normocephalic and atraumatic.  Conjunctivae are normal.  No scleral icterus. Cardiovascular: Normal rate, regular rhythm and intact distal pulses. No M/R/G Pulmonary/chest: Effort normal and breath sounds normal. No wheezing, rales or rhonchi. Abdominal: Soft, umbilical  hernia without tenderness, nondistended. Bowel sounds active throughout.  Extremities: no clubbing, cyanosis, or edema Lymphadenopathy: No cervical adenopathy noted. Neurological: Alert and oriented to person place and time. Psychiatric: Normal mood and affect. Behavior is normal.  CBC    Component Value Date/Time   WBC 7.7 01/02/2013 2345   RBC 4.25 01/02/2013 2345   HGB 12.1 01/02/2013 2345   HCT 36.2 01/02/2013 2345   PLT 297 01/02/2013 2345   MCV 85.2 01/02/2013 2345   MCH 28.5 01/02/2013 2345   MCHC 33.4 01/02/2013 2345   RDW 13.0 01/02/2013 2345   LYMPHSABS 2.7 01/02/2013 2345   MONOABS 0.6 01/02/2013 2345   EOSABS 0.3 01/02/2013 2345   BASOSABS 0.0 01/02/2013 2345    CMP     Component Value Date/Time   NA 138 01/02/2013 2345   K 3.6 01/02/2013 2345   CL 102 01/02/2013 2345   CO2 27 01/02/2013 2345   GLUCOSE 108* 01/02/2013 2345   BUN 9 01/02/2013 2345   CREATININE 0.84 01/02/2013 2345   CALCIUM 9.2 01/02/2013 2345   PROT 7.4 10/11/2012 1200   ALBUMIN 3.6 10/11/2012 1200   AST 12 10/11/2012 1200   ALT 10 10/11/2012 1200   ALKPHOS 81 10/11/2012 1200   BILITOT 0.3 10/11/2012 1200   GFRNONAA 84* 01/02/2013 2345   GFRAA >90 01/02/2013 2345   CT ABDOMEN AND PELVIS WITH CONTRAST -- 04/2012   Technique:  Multidetector CT imaging of the abdomen and pelvis was performed following the standard protocol during bolus administration of intravenous contrast.   Contrast: 127mL OMNIPAQUE IOHEXOL 300 MG/ML  SOLN, 72mL OMNIPAQUE IOHEXOL 300 MG/ML  SOLN   Comparison: 06/04/2011 and earlier.   Findings: Lower lung volumes.  Lung  base ground-glass opacity probably relate to atelectasis.  No pericardial or pleural effusion. Stable visualized osseous structures.  Trace pelvic free fluid.  Uterus and adnexa appear stable within normal limits. Negative rectum.   No residual sigmoid colon inflammation.  Occasional left colon diverticula again noted.  Retained stool in the colon.  Oral contrast has not  yet reached the terminal ileum.  No dilated small bowel.  Stable small fat containing umbilical hernia.  Small hiatal hernia.  Otherwise negative stomach and duodenum.   Liver, gallbladder, spleen, pancreas and adrenal glands are within normal limits.  Portal venous system and major arterial structures in the abdomen and pelvis are within normal limits.  No abdominal free fluid.  Normal kidneys.  Normal ureters.  Bladder unremarkable.  No lymphadenopathy.   IMPRESSION: Interval resolved sigmoid diverticulitis.  No acute or inflammatory findings identified in the abdomen or pelvis.  Mild lung base atelectasis.        Assessment & Plan:  43 year old female with a past medical history of GERD with reflux esophagitis, hiatal hernia, adenomatous colon polyp, diverticulosis with diverticulitis, umbilical hernia, IBS, lactose intolerance, hypertension and migraines who is seen in followup  1.  GERD with reflux esophagitis/hiatal hernia -- we discussed GERD hygiene today including dietary and lifestyle modifications. We discussed how weight loss may improve her GERD symptoms. Her symptoms are well-controlled pantoprazole 40 mg daily. We will continue this medication for now. We did briefly discuss Nissen fundoplication as an option to repair her hernia and potentially control reflux. We did also discuss that 30-40% of patients require PPI after Nissen.  She is not interested in pursuing surgery at this time.  2.  History of adenomatous colon polyps -- no polyps on recent colonoscopy, I recommended a five-year surveillance interval. She would be due January 2020  3.  Diverticulosis -- history of diverticulitis, high fiber diet.  Return in 6 months, sooner if necessary

## 2013-09-08 ENCOUNTER — Other Ambulatory Visit (HOSPITAL_COMMUNITY): Payer: Self-pay | Admitting: Family

## 2013-09-08 DIAGNOSIS — Z1231 Encounter for screening mammogram for malignant neoplasm of breast: Secondary | ICD-10-CM

## 2013-10-10 ENCOUNTER — Ambulatory Visit (HOSPITAL_COMMUNITY): Admission: RE | Admit: 2013-10-10 | Payer: Medicaid Other | Source: Ambulatory Visit

## 2013-10-31 ENCOUNTER — Emergency Department (HOSPITAL_COMMUNITY)
Admission: EM | Admit: 2013-10-31 | Discharge: 2013-11-01 | Discharge status: Against Medical Advice | Payer: Medicaid Other | Attending: Emergency Medicine | Admitting: Emergency Medicine

## 2013-10-31 ENCOUNTER — Encounter (HOSPITAL_COMMUNITY): Payer: Self-pay | Admitting: Emergency Medicine

## 2013-10-31 DIAGNOSIS — R42 Dizziness and giddiness: Secondary | ICD-10-CM | POA: Insufficient documentation

## 2013-10-31 DIAGNOSIS — J45909 Unspecified asthma, uncomplicated: Secondary | ICD-10-CM | POA: Insufficient documentation

## 2013-10-31 DIAGNOSIS — R51 Headache: Secondary | ICD-10-CM | POA: Insufficient documentation

## 2013-10-31 DIAGNOSIS — I10 Essential (primary) hypertension: Secondary | ICD-10-CM | POA: Insufficient documentation

## 2013-10-31 NOTE — ED Notes (Signed)
Pt states a couple hours ago started having dizziness and a headache, states she took an aleve at home w/ no relief.

## 2013-11-27 ENCOUNTER — Encounter (HOSPITAL_COMMUNITY): Payer: Self-pay | Admitting: Emergency Medicine

## 2013-12-26 ENCOUNTER — Emergency Department (HOSPITAL_COMMUNITY)
Admission: EM | Admit: 2013-12-26 | Discharge: 2013-12-26 | Disposition: A | Discharge status: Home / Self Care | Payer: Medicaid Other | Attending: Emergency Medicine | Admitting: Emergency Medicine

## 2013-12-26 ENCOUNTER — Encounter (HOSPITAL_COMMUNITY): Payer: Self-pay

## 2013-12-26 DIAGNOSIS — J45909 Unspecified asthma, uncomplicated: Secondary | ICD-10-CM | POA: Insufficient documentation

## 2013-12-26 DIAGNOSIS — Z8742 Personal history of other diseases of the female genital tract: Secondary | ICD-10-CM | POA: Insufficient documentation

## 2013-12-26 DIAGNOSIS — Z9049 Acquired absence of other specified parts of digestive tract: Secondary | ICD-10-CM | POA: Insufficient documentation

## 2013-12-26 DIAGNOSIS — Z862 Personal history of diseases of the blood and blood-forming organs and certain disorders involving the immune mechanism: Secondary | ICD-10-CM | POA: Insufficient documentation

## 2013-12-26 DIAGNOSIS — Z3202 Encounter for pregnancy test, result negative: Secondary | ICD-10-CM | POA: Insufficient documentation

## 2013-12-26 DIAGNOSIS — Z86010 Personal history of colonic polyps: Secondary | ICD-10-CM | POA: Insufficient documentation

## 2013-12-26 DIAGNOSIS — K219 Gastro-esophageal reflux disease without esophagitis: Secondary | ICD-10-CM | POA: Insufficient documentation

## 2013-12-26 DIAGNOSIS — Z8639 Personal history of other endocrine, nutritional and metabolic disease: Secondary | ICD-10-CM | POA: Insufficient documentation

## 2013-12-26 DIAGNOSIS — Z9851 Tubal ligation status: Secondary | ICD-10-CM | POA: Insufficient documentation

## 2013-12-26 DIAGNOSIS — Z79899 Other long term (current) drug therapy: Secondary | ICD-10-CM | POA: Insufficient documentation

## 2013-12-26 DIAGNOSIS — R1084 Generalized abdominal pain: Secondary | ICD-10-CM | POA: Insufficient documentation

## 2013-12-26 DIAGNOSIS — I10 Essential (primary) hypertension: Secondary | ICD-10-CM | POA: Insufficient documentation

## 2013-12-26 DIAGNOSIS — Z791 Long term (current) use of non-steroidal anti-inflammatories (NSAID): Secondary | ICD-10-CM | POA: Insufficient documentation

## 2013-12-26 DIAGNOSIS — Z8619 Personal history of other infectious and parasitic diseases: Secondary | ICD-10-CM | POA: Insufficient documentation

## 2013-12-26 LAB — CBC WITH DIFFERENTIAL/PLATELET
Basophils Absolute: 0 10*3/uL (ref 0.0–0.1)
Basophils Relative: 0 % (ref 0–1)
EOS ABS: 0.1 10*3/uL (ref 0.0–0.7)
EOS PCT: 2 % (ref 0–5)
HCT: 40 % (ref 36.0–46.0)
HEMOGLOBIN: 12.9 g/dL (ref 12.0–15.0)
Lymphocytes Relative: 18 % (ref 12–46)
Lymphs Abs: 1.7 10*3/uL (ref 0.7–4.0)
MCH: 27.7 pg (ref 26.0–34.0)
MCHC: 32.3 g/dL (ref 30.0–36.0)
MCV: 86 fL (ref 78.0–100.0)
MONOS PCT: 9 % (ref 3–12)
Monocytes Absolute: 0.8 10*3/uL (ref 0.1–1.0)
Neutro Abs: 6.9 10*3/uL (ref 1.7–7.7)
Neutrophils Relative %: 71 % (ref 43–77)
PLATELETS: 302 10*3/uL (ref 150–400)
RBC: 4.65 MIL/uL (ref 3.87–5.11)
RDW: 13.3 % (ref 11.5–15.5)
WBC: 9.6 10*3/uL (ref 4.0–10.5)

## 2013-12-26 LAB — URINALYSIS, ROUTINE W REFLEX MICROSCOPIC
Bilirubin Urine: NEGATIVE
Glucose, UA: NEGATIVE mg/dL
Hgb urine dipstick: NEGATIVE
KETONES UR: NEGATIVE mg/dL
LEUKOCYTES UA: NEGATIVE
NITRITE: NEGATIVE
Protein, ur: NEGATIVE mg/dL
Specific Gravity, Urine: 1.011 (ref 1.005–1.030)
Urobilinogen, UA: 0.2 mg/dL (ref 0.0–1.0)
pH: 6 (ref 5.0–8.0)

## 2013-12-26 LAB — COMPREHENSIVE METABOLIC PANEL
ALT: 10 U/L (ref 0–35)
ANION GAP: 12 (ref 5–15)
AST: 15 U/L (ref 0–37)
Albumin: 3.7 g/dL (ref 3.5–5.2)
Alkaline Phosphatase: 86 U/L (ref 39–117)
BUN: 9 mg/dL (ref 6–23)
CALCIUM: 9.4 mg/dL (ref 8.4–10.5)
CO2: 25 mEq/L (ref 19–32)
Chloride: 100 mEq/L (ref 96–112)
Creatinine, Ser: 0.73 mg/dL (ref 0.50–1.10)
GFR calc Af Amer: >90 mL/min (ref >90)
GFR calc non Af Amer: >90 mL/min (ref >90)
Glucose, Bld: 96 mg/dL (ref 70–99)
Potassium: 4.1 mEq/L (ref 3.7–5.3)
Sodium: 137 mEq/L (ref 137–147)
TOTAL PROTEIN: 8.1 g/dL (ref 6.0–8.3)
Total Bilirubin: 0.7 mg/dL (ref 0.3–1.2)

## 2013-12-26 LAB — POC URINE PREG, ED: Preg Test, Ur: NEGATIVE

## 2013-12-26 LAB — WET PREP, GENITAL
Trich, Wet Prep: NONE SEEN
Yeast Wet Prep HPF POC: NONE SEEN

## 2013-12-26 LAB — LIPASE, BLOOD: LIPASE: 20 U/L (ref 11–59)

## 2013-12-26 MED ORDER — PANTOPRAZOLE SODIUM 40 MG PO TBEC: 40.0000 mg | DELAYED_RELEASE_TABLET | Freq: Every day | ORAL | Status: DC

## 2013-12-26 NOTE — ED Notes (Signed)
Per pt, abdominal pain starting yesterday.  No n/v/d  No fever.  States pain in lower abdomen and radiating to back.  Pt denies changes in urination or hx of kidney stones.

## 2013-12-26 NOTE — ED Provider Notes (Signed)
CSN: [id number]     Arrival date & time 12/26/13  0809 History   First MD Initiated Contact with Patient 12/26/13 0825     Chief Complaint  Patient presents with  . Abdominal Pain     (Consider location/radiation/quality/duration/timing/severity/associated sxs/prior Treatment) HPI Pt is a 43yo female w/ h/o IBS, reflux, and diverticulitis who presents with 2 days of intermittent lower abdominal pain and side pain. Pain is described as sharp and started yesterday and seems to come and go without cause. She denies n/v/d but does report a soft light colored stool yesterday.  Past Medical History  Diagnosis Date  . Other constipation   . Abdominal pain, unspecified site   . Hypertension   . Migraine   . IBS (irritable bowel syndrome)   . Umbilical hernia   . Uterine fibroid   . Chlamydia   . Esophageal reflux   . Personal history of colonic polyps 09/12/2010    tubular adenoma  . Lactose intolerance   . Hiatal hernia   . Diverticulitis   . Allergy   . Anemia   . Asthma    Past Surgical History  Procedure Laterality Date  . Appendectomy    . Tubal ligation    . Appendectomy     Family History  Problem Relation Age of Onset  . Colon cancer Neg Hx   . Esophageal cancer Neg Hx   . Rectal cancer Neg Hx   . Stomach cancer Neg Hx   . Hypertension Mother   . Cancer Mother     not sure of type   History  Substance Use Topics  . Smoking status: Never Smoker   . Smokeless tobacco: Never Used  . Alcohol Use: Yes     Comment: occasionally   OB History    Gravida Para Term Preterm AB TAB SAB Ectopic Multiple Living   7 3 3  4 3 1   3      Review of Systems  Constitutional: Negative for fever and chills.  Respiratory: Negative for shortness of breath.   Cardiovascular: Negative for chest pain.  Gastrointestinal: Positive for abdominal pain. Negative for nausea, vomiting, diarrhea, constipation and blood in stool.  Genitourinary: Positive for flank pain. Negative for  dysuria and frequency.  All other systems reviewed and are negative.     Allergies  Review of patient's allergies indicates no known allergies.  Home Medications   Prior to Admission medications   Medication Sig Start Date End Date Taking? Authorizing Provider  ibuprofen (ADVIL,MOTRIN) 200 MG tablet Take 200 mg by mouth every 6 (six) hours as needed (pain).    Historical Provider, MD  LOSARTAN POTASSIUM PO Take 1 tablet by mouth daily.    Historical Provider, MD  naproxen (NAPROSYN) 500 MG tablet Take 1 tablet (500 mg total) by mouth 2 (two) times daily. 03/07/13   Antonietta Breach, PA-C  pantoprazole (PROTONIX) 40 MG tablet Take 1 tablet (40 mg total) by mouth daily. 04/07/13   Jerene Bears, MD   BP 126/82 mmHg  Pulse 97  Temp(Src) 98 F (36.7 C) (Oral)  Resp 18  SpO2 100%  LMP 12/12/2013 Physical Exam  Constitutional: She is oriented to person, place, and time. She appears well-developed and well-nourished. No distress.  HENT:  Head: Normocephalic and atraumatic.  Eyes: Conjunctivae are normal. Right eye exhibits no discharge. Left eye exhibits no discharge. No scleral icterus.  Cardiovascular: Normal rate, regular rhythm, normal heart sounds and intact distal pulses.  Exam reveals no  gallop and no friction rub.   No murmur heard. Pulmonary/Chest: Effort normal and breath sounds normal. No respiratory distress. She has no wheezes. She has no rales. She exhibits no tenderness.  Abdominal: Soft. Bowel sounds are normal. She exhibits no distension and no mass. There is no hepatosplenomegaly. There is tenderness in the right lower quadrant, suprapubic area and left lower quadrant. There is no rigidity, no rebound, no guarding, no CVA tenderness and negative Murphy's sign.  Neurological: She is alert and oriented to person, place, and time.  Skin: Skin is warm and dry. No rash noted. She is not diaphoretic.  Psychiatric: She has a normal mood and affect. Her behavior is normal.  Nursing  note and vitals reviewed.   ED Course  Procedures (including critical care time) Labs Review Labs Reviewed  CBC WITH DIFFERENTIAL  COMPREHENSIVE METABOLIC PANEL  URINALYSIS, ROUTINE W REFLEX MICROSCOPIC    Imaging Review No results found.   EKG Interpretation None      MDM   Final diagnoses:  None   Abdominal pain in female w/ h/o diverticulitis, GERD, IBS. Will obtain UA, upreg, lipase, cbc, cmp.  Initial labs all normal. Likely functional abdominal pain given patient's history but will obtain pelvic exam with wet prep and GC/CT.  Pelvic exam normal. Wet prep with rare clue cells and rare WBCs, otherwise normal. GC/CT pending. Discharge home, recommend f/u with GI as patient is established with them ([facility]).      Frazier Richards, MD 12/26/13 [place], MD 12/28/13 [phone]

## 2013-12-26 NOTE — Discharge Instructions (Signed)
Abdominal Pain, Women °Abdominal (stomach, pelvic, or belly) pain can be caused by many things. It is important to tell your doctor: °· The location of the pain. °· Does it come and go or is it present all the time? °· Are there things that start the pain (eating certain foods, exercise)? °· Are there other symptoms associated with the pain (fever, nausea, vomiting, diarrhea)? °All of this is helpful to know when trying to find the cause of the pain. °CAUSES  °· Stomach: virus or bacteria infection, or ulcer. °· Intestine: appendicitis (inflamed appendix), regional ileitis (Crohn's disease), ulcerative colitis (inflamed colon), irritable bowel syndrome, diverticulitis (inflamed diverticulum of the colon), or cancer of the stomach or intestine. °· Gallbladder disease or stones in the gallbladder. °· Kidney disease, kidney stones, or infection. °· Pancreas infection or cancer. °· Fibromyalgia (pain disorder). °· Diseases of the female organs: °¨ Uterus: fibroid (non-cancerous) tumors or infection. °¨ Fallopian tubes: infection or tubal pregnancy. °¨ Ovary: cysts or tumors. °¨ Pelvic adhesions (scar tissue). °¨ Endometriosis (uterus lining tissue growing in the pelvis and on the pelvic organs). °¨ Pelvic congestion syndrome (female organs filling up with blood just before the menstrual period). °¨ Pain with the menstrual period. °¨ Pain with ovulation (producing an egg). °¨ Pain with an IUD (intrauterine device, birth control) in the uterus. °¨ Cancer of the female organs. °· Functional pain (pain not caused by a disease, may improve without treatment). °· Psychological pain. °· Depression. °DIAGNOSIS  °Your doctor will decide the seriousness of your pain by doing an examination. °· Blood tests. °· X-rays. °· Ultrasound. °· CT scan (computed tomography, special type of X-ray). °· MRI (magnetic resonance imaging). °· Cultures, for infection. °· Barium enema (dye inserted in the large intestine, to better view it with  X-rays). °· Colonoscopy (looking in intestine with a lighted tube). °· Laparoscopy (minor surgery, looking in abdomen with a lighted tube). °· Major abdominal exploratory surgery (looking in abdomen with a large incision). °TREATMENT  °The treatment will depend on the cause of the pain.  °· Many cases can be observed and treated at home. °· Over-the-counter medicines recommended by your caregiver. °· Prescription medicine. °· Antibiotics, for infection. °· Birth control pills, for painful periods or for ovulation pain. °· Hormone treatment, for endometriosis. °· Nerve blocking injections. °· Physical therapy. °· Antidepressants. °· Counseling with a psychologist or psychiatrist. °· Minor or major surgery. °HOME CARE INSTRUCTIONS  °· Do not take laxatives, unless directed by your caregiver. °· Take over-the-counter pain medicine only if ordered by your caregiver. Do not take aspirin because it can cause an upset stomach or bleeding. °· Try a clear liquid diet (broth or water) as ordered by your caregiver. Slowly move to a bland diet, as tolerated, if the pain is related to the stomach or intestine. °· Have a thermometer and take your temperature several times a day, and record it. °· Bed rest and sleep, if it helps the pain. °· Avoid sexual intercourse, if it causes pain. °· Avoid stressful situations. °· Keep your follow-up appointments and tests, as your caregiver orders. °· If the pain does not go away with medicine or surgery, you may try: °¨ Acupuncture. °¨ Relaxation exercises (yoga, meditation). °¨ Group therapy. °¨ Counseling. °SEEK MEDICAL CARE IF:  °· You notice certain foods cause stomach pain. °· Your home care treatment is not helping your pain. °· You need stronger pain medicine. °· You want your IUD removed. °· You feel faint or   lightheaded. °· You develop nausea and vomiting. °· You develop a rash. °· You are having side effects or an allergy to your medicine. °SEEK IMMEDIATE MEDICAL CARE IF:  °· Your  pain does not go away or gets worse. °· You have a fever. °· Your pain is felt only in portions of the abdomen. The right side could possibly be appendicitis. The left lower portion of the abdomen could be colitis or diverticulitis. °· You are passing blood in your stools (bright red or black tarry stools, with or without vomiting). °· You have blood in your urine. °· You develop chills, with or without a fever. °· You pass out. °MAKE SURE YOU:  °· Understand these instructions. °· Will watch your condition. °· Will get help right away if you are not doing well or get worse. °Document Released: 11/09/2006 Document Revised: 05/29/2013 Document Reviewed: 11/29/2008 °[employer]® Patient Information ©2015 [employer]. This information is not intended to replace advice given to you by your health care provider. Make sure you discuss any questions you have with your health care provider. ° °

## 2013-12-27 LAB — GC/CHLAMYDIA PROBE AMP
CT Probe RNA: NEGATIVE
GC Probe RNA: NEGATIVE

## 2014-01-21 ENCOUNTER — Encounter (HOSPITAL_COMMUNITY): Payer: Self-pay | Admitting: *Deleted

## 2014-01-21 ENCOUNTER — Emergency Department (HOSPITAL_COMMUNITY)
Admission: EM | Admit: 2014-01-21 | Discharge: 2014-01-21 | Disposition: A | Discharge status: Home / Self Care | Payer: Medicaid Other | Attending: Emergency Medicine | Admitting: Emergency Medicine

## 2014-01-21 DIAGNOSIS — Z86010 Personal history of colonic polyps: Secondary | ICD-10-CM | POA: Insufficient documentation

## 2014-01-21 DIAGNOSIS — Z8742 Personal history of other diseases of the female genital tract: Secondary | ICD-10-CM | POA: Insufficient documentation

## 2014-01-21 DIAGNOSIS — Z8639 Personal history of other endocrine, nutritional and metabolic disease: Secondary | ICD-10-CM | POA: Insufficient documentation

## 2014-01-21 DIAGNOSIS — G8929 Other chronic pain: Secondary | ICD-10-CM | POA: Insufficient documentation

## 2014-01-21 DIAGNOSIS — J45909 Unspecified asthma, uncomplicated: Secondary | ICD-10-CM | POA: Insufficient documentation

## 2014-01-21 DIAGNOSIS — R112 Nausea with vomiting, unspecified: Secondary | ICD-10-CM | POA: Insufficient documentation

## 2014-01-21 DIAGNOSIS — K219 Gastro-esophageal reflux disease without esophagitis: Secondary | ICD-10-CM | POA: Insufficient documentation

## 2014-01-21 DIAGNOSIS — Z9851 Tubal ligation status: Secondary | ICD-10-CM | POA: Insufficient documentation

## 2014-01-21 DIAGNOSIS — Z3202 Encounter for pregnancy test, result negative: Secondary | ICD-10-CM | POA: Insufficient documentation

## 2014-01-21 DIAGNOSIS — Z9089 Acquired absence of other organs: Secondary | ICD-10-CM | POA: Insufficient documentation

## 2014-01-21 DIAGNOSIS — R109 Unspecified abdominal pain: Secondary | ICD-10-CM | POA: Insufficient documentation

## 2014-01-21 DIAGNOSIS — Z862 Personal history of diseases of the blood and blood-forming organs and certain disorders involving the immune mechanism: Secondary | ICD-10-CM | POA: Insufficient documentation

## 2014-01-21 DIAGNOSIS — I10 Essential (primary) hypertension: Secondary | ICD-10-CM | POA: Insufficient documentation

## 2014-01-21 DIAGNOSIS — G43809 Other migraine, not intractable, without status migrainosus: Secondary | ICD-10-CM | POA: Insufficient documentation

## 2014-01-21 DIAGNOSIS — R197 Diarrhea, unspecified: Secondary | ICD-10-CM | POA: Insufficient documentation

## 2014-01-21 DIAGNOSIS — Z791 Long term (current) use of non-steroidal anti-inflammatories (NSAID): Secondary | ICD-10-CM | POA: Insufficient documentation

## 2014-01-21 DIAGNOSIS — Z79899 Other long term (current) drug therapy: Secondary | ICD-10-CM | POA: Insufficient documentation

## 2014-01-21 DIAGNOSIS — Z8619 Personal history of other infectious and parasitic diseases: Secondary | ICD-10-CM | POA: Insufficient documentation

## 2014-01-21 LAB — URINALYSIS, ROUTINE W REFLEX MICROSCOPIC
Bilirubin Urine: NEGATIVE
GLUCOSE, UA: NEGATIVE mg/dL
HGB URINE DIPSTICK: NEGATIVE
KETONES UR: NEGATIVE mg/dL
Leukocytes, UA: NEGATIVE
Nitrite: NEGATIVE
Protein, ur: NEGATIVE mg/dL
Specific Gravity, Urine: 1.025 (ref 1.005–1.030)
Urobilinogen, UA: 1 mg/dL (ref 0.0–1.0)
pH: 6 (ref 5.0–8.0)

## 2014-01-21 LAB — COMPREHENSIVE METABOLIC PANEL
ALT: 12 U/L (ref 0–35)
AST: 15 U/L (ref 0–37)
Albumin: 4.1 g/dL (ref 3.5–5.2)
Alkaline Phosphatase: 71 U/L (ref 39–117)
Anion gap: 8 (ref 5–15)
BUN: 13 mg/dL (ref 6–23)
CALCIUM: 9 mg/dL (ref 8.4–10.5)
CHLORIDE: 105 meq/L (ref 96–112)
CO2: 21 mmol/L (ref 19–32)
Creatinine, Ser: 0.57 mg/dL (ref 0.50–1.10)
GFR calc Af Amer: >90 mL/min (ref >90)
Glucose, Bld: 104 mg/dL — ABNORMAL HIGH (ref 70–99)
Potassium: 4.2 mmol/L (ref 3.5–5.1)
Sodium: 134 mmol/L — ABNORMAL LOW (ref 135–145)
Total Bilirubin: 0.7 mg/dL (ref 0.3–1.2)
Total Protein: 7.9 g/dL (ref 6.0–8.3)

## 2014-01-21 LAB — CBC WITH DIFFERENTIAL/PLATELET
Basophils Absolute: 0 10*3/uL (ref 0.0–0.1)
Basophils Relative: 0 % (ref 0–1)
EOS PCT: 0 % (ref 0–5)
Eosinophils Absolute: 0 10*3/uL (ref 0.0–0.7)
HCT: 41 % (ref 36.0–46.0)
HEMOGLOBIN: 13.4 g/dL (ref 12.0–15.0)
LYMPHS ABS: 0.5 10*3/uL — AB (ref 0.7–4.0)
Lymphocytes Relative: 7 % — ABNORMAL LOW (ref 12–46)
MCH: 28.5 pg (ref 26.0–34.0)
MCHC: 32.7 g/dL (ref 30.0–36.0)
MCV: 87 fL (ref 78.0–100.0)
MONOS PCT: 5 % (ref 3–12)
Monocytes Absolute: 0.4 10*3/uL (ref 0.1–1.0)
Neutro Abs: 6.2 10*3/uL (ref 1.7–7.7)
Neutrophils Relative %: 88 % — ABNORMAL HIGH (ref 43–77)
PLATELETS: 280 10*3/uL (ref 150–400)
RBC: 4.71 MIL/uL (ref 3.87–5.11)
RDW: 13.5 % (ref 11.5–15.5)
WBC: 7.1 10*3/uL (ref 4.0–10.5)

## 2014-01-21 LAB — POC URINE PREG, ED: Preg Test, Ur: NEGATIVE

## 2014-01-21 LAB — LIPASE, BLOOD: LIPASE: 21 U/L (ref 11–59)

## 2014-01-21 MED ORDER — ONDANSETRON HCL 8 MG PO TABS: 8.0000 mg | ORAL_TABLET | Freq: Three times a day (TID) | ORAL | Status: DC | PRN

## 2014-01-21 MED ORDER — SODIUM CHLORIDE 0.9 % IV BOLUS (SEPSIS): 1000.0000 mL | Freq: Once | INTRAVENOUS | Status: DC

## 2014-01-21 MED ORDER — OXYCODONE-ACETAMINOPHEN 5-325 MG PO TABS
1.0000 | ORAL_TABLET | ORAL | Status: DC | PRN
Start: 2014-01-21 — End: 2014-08-27

## 2014-01-21 MED ORDER — ONDANSETRON HCL 4 MG/2ML IJ SOLN
4.0000 mg | Freq: Once | INTRAMUSCULAR | Status: DC
  Filled 2014-01-21: qty 2

## 2014-01-21 MED ORDER — KETOROLAC TROMETHAMINE 30 MG/ML IJ SOLN
30.0000 mg | Freq: Once | INTRAMUSCULAR | Status: DC
  Filled 2014-01-21: qty 1

## 2014-01-21 NOTE — ED Provider Notes (Signed)
CSN: [id number]     Arrival date & time 01/21/14  1234 History   First MD Initiated Contact with Patient 01/21/14 1404     Chief Complaint  Patient presents with  . Headache  . Emesis  . Abdominal Pain     (Consider location/radiation/quality/duration/timing/severity/associated sxs/prior Treatment) HPI   Briana Wade is a 43 y.o. female presents for evaluation of headache which started this morning.  She has also had abdominal pain, left-sided, which feels like her diverticulitis, for about a day and a half.  She has some nausea with vomiting.  He has had some mild diarrhea, without bleeding.  She denies fever.  She cannot recall ever seeing a gastroenterologist.  She was in the ED several weeks ago with a similar process.  She has been seen in the past by gastroenterology who have diagnosed her with GERD, hiatal hernia, IBS, lactose intolerance, and diverticulosis with diverticulitis.  She has also had an adenomatous colon polyp.  Dr. Hilarie Fredrickson recommended a repeat colonoscopy in 2020.  She last saw him in the office in March 2015.  She is taking her usual medications.  There are no other known modifying factors.    Past Medical History  Diagnosis Date  . Other constipation   . Abdominal pain, unspecified site   . Hypertension   . Migraine   . IBS (irritable bowel syndrome)   . Umbilical hernia   . Uterine fibroid   . Chlamydia   . Esophageal reflux   . Personal history of colonic polyps 09/12/2010    tubular adenoma  . Lactose intolerance   . Hiatal hernia   . Diverticulitis   . Allergy   . Anemia   . Asthma    Past Surgical History  Procedure Laterality Date  . Appendectomy    . Tubal ligation    . Appendectomy     Family History  Problem Relation Age of Onset  . Colon cancer Neg Hx   . Esophageal cancer Neg Hx   . Rectal cancer Neg Hx   . Stomach cancer Neg Hx   . Hypertension Mother   . Cancer Mother     not sure of type   History  Substance Use Topics  .  Smoking status: Never Smoker   . Smokeless tobacco: Never Used  . Alcohol Use: Yes     Comment: occasionally   OB History    Gravida Para Term Preterm AB TAB SAB Ectopic Multiple Living   7 3 3  4 3 1   3      Review of Systems  All other systems reviewed and are negative.     Allergies  Review of patient's allergies indicates no known allergies.  Home Medications   Prior to Admission medications   Medication Sig Start Date End Date Taking? Authorizing Provider  albuterol (PROVENTIL HFA;VENTOLIN HFA) 108 (90 BASE) MCG/ACT inhaler Inhale 1-2 puffs into the lungs every 6 (six) hours as needed for wheezing or shortness of breath.   Yes Historical Provider, MD  ibuprofen (ADVIL,MOTRIN) 200 MG tablet Take 400-800 mg by mouth every 6 (six) hours as needed.   Yes Historical Provider, MD  losartan-hydrochlorothiazide (HYZAAR) 100-25 MG per tablet Take 1 tablet by mouth daily.   Yes Historical Provider, MD  pantoprazole (PROTONIX) 40 MG tablet Take 1 tablet (40 mg total) by mouth daily. 12/26/13  Yes Frazier Richards, MD  naproxen (NAPROSYN) 500 MG tablet Take 1 tablet (500 mg total) by mouth 2 (two)  times daily. Patient not taking: Reported on 12/26/2013 03/07/13   Antonietta Breach, PA-C  ondansetron (ZOFRAN) 8 MG tablet Take 1 tablet (8 mg total) by mouth every 8 (eight) hours as needed for nausea or vomiting. 01/21/14   Richarda Blade, MD  oxyCODONE-acetaminophen (PERCOCET/ROXICET) 5-325 MG per tablet Take 1 tablet by mouth every 4 (four) hours as needed for severe pain. 01/21/14   Richarda Blade, MD  pantoprazole (PROTONIX) 40 MG tablet Take 1 tablet (40 mg total) by mouth daily. Patient not taking: Reported on 12/26/2013 04/07/13   Jerene Bears, MD   BP 131/88 mmHg  Pulse 98  Temp(Src) 98.5 F (36.9 C) (Oral)  Resp 16  Ht 5\' 1"  (1.549 m)  Wt 207 lb (93.895 kg)  BMI 39.13 kg/m2  SpO2 100%  LMP 12/12/2013 Physical Exam  Constitutional: She is oriented to person, place, and time. She  appears well-developed and well-nourished. She appears distressed (she has a worried look on her face).  HENT:  Head: Normocephalic and atraumatic.  Right Ear: External ear normal.  Left Ear: External ear normal.  Eyes: Conjunctivae and EOM are normal. Pupils are equal, round, and reactive to light.  Neck: Normal range of motion and phonation normal. Neck supple.  No meningismus  Cardiovascular: Normal rate, regular rhythm and normal heart sounds.   Pulmonary/Chest: Effort normal and breath sounds normal. She exhibits no bony tenderness.  Abdominal: Soft. She exhibits no mass. There is tenderness (Left mid abdomen, mild). There is no rebound and no guarding.  Musculoskeletal: Normal range of motion.  Neurological: She is alert and oriented to person, place, and time. No cranial nerve deficit or sensory deficit. She exhibits normal muscle tone. Coordination normal.  Skin: Skin is warm, dry and intact.  Psychiatric: She has a normal mood and affect. Her behavior is normal. Judgment and thought content normal.  Nursing note and vitals reviewed.   ED Course  Procedures (including critical care time)  Medications - No data to display  Patient Vitals for the past 24 hrs:  BP Temp Temp src Pulse Resp SpO2 Height Weight  01/21/14 1618 131/88 mmHg - - 98 16 100 % - -  01/21/14 1245 138/89 mmHg 98.5 F (36.9 C) Oral 99 18 99 % 5\' 1"  (1.549 m) 207 lb (93.895 kg)    3:53 PM Reevaluation with update and discussion. After initial assessment and treatment, an updated evaluation reveals IV fluids and IV medications have been ordered, but nursing has been unable to obtain IV access.  At this time, the patient is interested in taking oral medications, and treating herself at home.Daleen Bo L    Labs Review Labs Reviewed  COMPREHENSIVE METABOLIC PANEL - Abnormal; Notable for the following:    Sodium 134 (*)    Glucose, Bld 104 (*)    All other components within normal limits  CBC WITH  DIFFERENTIAL - Abnormal; Notable for the following:    Neutrophils Relative % 88 (*)    Lymphocytes Relative 7 (*)    Lymphs Abs 0.5 (*)    All other components within normal limits  LIPASE, BLOOD  URINALYSIS, ROUTINE W REFLEX MICROSCOPIC  POC URINE PREG, ED    Imaging Review No results found.   EKG Interpretation None      MDM   Final diagnoses:  Abdominal pain, unspecified abdominal location    Ongoing chronic intermittent abdominal pain.  Reassuring clinical and laboratory evaluation.  No indication for further treatment, DVT, or hospital setting.  Nursing Notes Reviewed/ Care Coordinated Applicable Imaging Reviewed Interpretation of Laboratory Data incorporated into ED treatment  The patient appears reasonably screened and/or stabilized for discharge and I doubt any other medical condition or other [facility] requiring further screening, evaluation, or treatment in the ED at this time prior to discharge.  Plan: Home Medications- Percocet, Zofran; Home Treatments- rest; return here if the recommended treatment, does not improve the symptoms; Recommended follow up- PCP and GI asap   Richarda Blade, MD 01/21/14 [phone]

## 2014-01-21 NOTE — ED Notes (Signed)
Pt reports migraine headache, left sided abdominal pain, and vomited 1 hour ago. Pain 10/10.

## 2014-01-21 NOTE — ED Notes (Signed)
MD Wentz at bedside.  

## 2014-01-21 NOTE — ED Notes (Signed)
Denies dysuria, reports headache since this a.m. And side pain.

## 2014-01-21 NOTE — Discharge Instructions (Signed)

## 2014-01-21 NOTE — ED Notes (Signed)
Pt reports headache, left sided abdominal pain, and vomiting 1 hour ago. Pain 10/10.

## 2014-01-22 ENCOUNTER — Emergency Department (HOSPITAL_COMMUNITY)
Admission: EM | Admit: 2014-01-22 | Discharge: 2014-01-22 | Disposition: A | Discharge status: Home / Self Care | Payer: Medicaid Other | Attending: Emergency Medicine | Admitting: Emergency Medicine

## 2014-01-22 ENCOUNTER — Encounter (HOSPITAL_COMMUNITY): Payer: Self-pay | Admitting: *Deleted

## 2014-01-22 DIAGNOSIS — G43809 Other migraine, not intractable, without status migrainosus: Secondary | ICD-10-CM

## 2014-01-22 MED ORDER — METOCLOPRAMIDE HCL 5 MG/ML IJ SOLN
10.0000 mg | Freq: Once | INTRAMUSCULAR | Status: AC
  Administered 2014-01-22: 10 mg via INTRAVENOUS
  Filled 2014-01-22: qty 2

## 2014-01-22 MED ORDER — DEXAMETHASONE SODIUM PHOSPHATE 10 MG/ML IJ SOLN
10.0000 mg | Freq: Once | INTRAMUSCULAR | Status: AC
  Administered 2014-01-22: 10 mg via INTRAVENOUS
  Filled 2014-01-22: qty 1

## 2014-01-22 MED ORDER — DIPHENHYDRAMINE HCL 50 MG/ML IJ SOLN
12.5000 mg | Freq: Once | INTRAMUSCULAR | Status: DC
  Filled 2014-01-22: qty 1

## 2014-01-22 MED ORDER — SODIUM CHLORIDE 0.9 % IV SOLN: Freq: Once | INTRAVENOUS | Status: DC

## 2014-01-22 NOTE — Discharge Instructions (Signed)

## 2014-01-22 NOTE — ED Provider Notes (Signed)
CSN: [id number]     Arrival date & time 01/21/14  2351 History   First MD Initiated Contact with Patient 01/22/14 0018     Chief Complaint  Patient presents with  . Migraine     (Consider location/radiation/quality/duration/timing/severity/associated sxs/prior Treatment) Patient is a 43 y.o. female presenting with migraines. The history is provided by the patient. No language interpreter was used.  Migraine Associated symptoms include headaches, nausea and vomiting. Pertinent negatives include no chills or fever. Associated symptoms comments: She returns to the emergency department after being seen yesterday for headache similar to her migraine headaches she has had in the past. She was unable to fill the medications provided by prescription which included Percocet and Zofran. No fever. She presents for persistent headache pain, and nausea. .    Past Medical History  Diagnosis Date  . Other constipation   . Abdominal pain, unspecified site   . Hypertension   . Migraine   . IBS (irritable bowel syndrome)   . Umbilical hernia   . Uterine fibroid   . Chlamydia   . Esophageal reflux   . Personal history of colonic polyps 09/12/2010    tubular adenoma  . Lactose intolerance   . Hiatal hernia   . Diverticulitis   . Allergy   . Anemia   . Asthma    Past Surgical History  Procedure Laterality Date  . Appendectomy    . Tubal ligation    . Appendectomy     Family History  Problem Relation Age of Onset  . Colon cancer Neg Hx   . Esophageal cancer Neg Hx   . Rectal cancer Neg Hx   . Stomach cancer Neg Hx   . Hypertension Mother   . Cancer Mother     not sure of type   History  Substance Use Topics  . Smoking status: Never Smoker   . Smokeless tobacco: Never Used  . Alcohol Use: Yes     Comment: occasionally   OB History    Gravida Para Term Preterm AB TAB SAB Ectopic Multiple Living   7 3 3  4 3 1   3      Review of Systems  Constitutional: Negative for fever and  chills.  Respiratory: Negative.   Cardiovascular: Negative.   Gastrointestinal: Positive for nausea and vomiting.  Musculoskeletal: Negative.   Skin: Negative.   Neurological: Positive for headaches.      Allergies  Review of patient's allergies indicates no known allergies.  Home Medications   Prior to Admission medications   Medication Sig Start Date End Date Taking? Authorizing Provider  albuterol (PROVENTIL HFA;VENTOLIN HFA) 108 (90 BASE) MCG/ACT inhaler Inhale 1-2 puffs into the lungs every 6 (six) hours as needed for wheezing or shortness of breath.   Yes Historical Provider, MD  ibuprofen (ADVIL,MOTRIN) 200 MG tablet Take 400-800 mg by mouth every 6 (six) hours as needed for moderate pain.    Yes Historical Provider, MD  losartan-hydrochlorothiazide (HYZAAR) 100-25 MG per tablet Take 1 tablet by mouth daily.   Yes Historical Provider, MD  ondansetron (ZOFRAN) 8 MG tablet Take 1 tablet (8 mg total) by mouth every 8 (eight) hours as needed for nausea or vomiting. 01/21/14  Yes Richarda Blade, MD  oxyCODONE-acetaminophen (PERCOCET/ROXICET) 5-325 MG per tablet Take 1 tablet by mouth every 4 (four) hours as needed for severe pain. 01/21/14  Yes Richarda Blade, MD  pantoprazole (PROTONIX) 40 MG tablet Take 1 tablet (40 mg total) by mouth daily.  12/26/13  Yes Frazier Richards, MD  naproxen (NAPROSYN) 500 MG tablet Take 1 tablet (500 mg total) by mouth 2 (two) times daily. Patient not taking: Reported on 12/26/2013 03/07/13   Antonietta Breach, PA-C  pantoprazole (PROTONIX) 40 MG tablet Take 1 tablet (40 mg total) by mouth daily. Patient not taking: Reported on 12/26/2013 04/07/13   Jerene Bears, MD   BP 132/74 mmHg  Pulse 112  Temp(Src) 98.6 F (37 C) (Oral)  Resp 18  SpO2 97%  LMP 12/12/2013 Physical Exam  Constitutional: She is oriented to person, place, and time. She appears well-developed and well-nourished.  HENT:  Head: Normocephalic.  Neck: Normal range of motion. Neck supple.   Cardiovascular: Normal rate and regular rhythm.   Pulmonary/Chest: Effort normal and breath sounds normal.  Abdominal: Soft. Bowel sounds are normal. There is no tenderness. There is no rebound and no guarding.  Musculoskeletal: Normal range of motion.  Neurological: She is alert and oriented to person, place, and time.  CN's 3-12 grossly intact. Speech clear and focused. No deficits of coordination.   Skin: Skin is warm and dry. No rash noted.  Psychiatric: She has a normal mood and affect.    ED Course  Procedures (including critical care time) Labs Review Labs Reviewed - No data to display  Imaging Review No results found.   EKG Interpretation None      MDM   Final diagnoses:  None    1. Migraine headache  She has a non-focal neurologic exam. Pain is better after medications in ED. She reports she is ready for discharge home. She has Rx's for home medications.     Dewaine Oats, PA-C 01/22/14 0229  Wynetta Fines, MD 01/22/14 [phone]

## 2014-01-22 NOTE — ED Notes (Signed)
Pt states she was seen here earlier today w/ migraine, n/v/d, states has had migraine x 3 days and the n/v/d started this morning, states was given prescriptions and sent home, states the pharmacy closed before she could pick it up.

## 2014-08-27 ENCOUNTER — Emergency Department (HOSPITAL_COMMUNITY): Payer: Medicaid Other

## 2014-08-27 ENCOUNTER — Encounter (HOSPITAL_COMMUNITY): Payer: Self-pay | Admitting: Emergency Medicine

## 2014-08-27 ENCOUNTER — Emergency Department (HOSPITAL_COMMUNITY)
Admission: EM | Admit: 2014-08-27 | Discharge: 2014-08-27 | Disposition: A | Discharge status: Home / Self Care | Payer: Self-pay | Attending: Emergency Medicine | Admitting: Emergency Medicine

## 2014-08-27 ENCOUNTER — Emergency Department (HOSPITAL_COMMUNITY): Payer: Self-pay

## 2014-08-27 DIAGNOSIS — J45909 Unspecified asthma, uncomplicated: Secondary | ICD-10-CM | POA: Insufficient documentation

## 2014-08-27 DIAGNOSIS — G43909 Migraine, unspecified, not intractable, without status migrainosus: Secondary | ICD-10-CM | POA: Insufficient documentation

## 2014-08-27 DIAGNOSIS — R29898 Other symptoms and signs involving the musculoskeletal system: Secondary | ICD-10-CM

## 2014-08-27 DIAGNOSIS — Z86018 Personal history of other benign neoplasm: Secondary | ICD-10-CM | POA: Insufficient documentation

## 2014-08-27 DIAGNOSIS — Z862 Personal history of diseases of the blood and blood-forming organs and certain disorders involving the immune mechanism: Secondary | ICD-10-CM | POA: Insufficient documentation

## 2014-08-27 DIAGNOSIS — Z8619 Personal history of other infectious and parasitic diseases: Secondary | ICD-10-CM | POA: Insufficient documentation

## 2014-08-27 DIAGNOSIS — H538 Other visual disturbances: Secondary | ICD-10-CM | POA: Insufficient documentation

## 2014-08-27 DIAGNOSIS — G43109 Migraine with aura, not intractable, without status migrainosus: Secondary | ICD-10-CM

## 2014-08-27 DIAGNOSIS — R202 Paresthesia of skin: Secondary | ICD-10-CM

## 2014-08-27 DIAGNOSIS — Z79899 Other long term (current) drug therapy: Secondary | ICD-10-CM | POA: Insufficient documentation

## 2014-08-27 DIAGNOSIS — R531 Weakness: Secondary | ICD-10-CM | POA: Insufficient documentation

## 2014-08-27 DIAGNOSIS — I10 Essential (primary) hypertension: Secondary | ICD-10-CM | POA: Insufficient documentation

## 2014-08-27 HISTORY — DX: Migraine with aura, not intractable, without status migrainosus: G43.109

## 2014-08-27 LAB — I-STAT CHEM 8, ED
BUN: 14 mg/dL (ref 6–20)
Calcium, Ion: 1.12 mmol/L (ref 1.12–1.23)
Chloride: 102 mmol/L (ref 101–111)
Creatinine, Ser: 0.8 mg/dL (ref 0.44–1.00)
Glucose, Bld: 101 mg/dL — ABNORMAL HIGH (ref 65–99)
HCT: 43 % (ref 36.0–46.0)
Hemoglobin: 14.6 g/dL (ref 12.0–15.0)
Potassium: 3.9 mmol/L (ref 3.5–5.1)
SODIUM: 139 mmol/L (ref 135–145)
TCO2: 25 mmol/L (ref 0–100)

## 2014-08-27 LAB — CBC
HCT: 37.2 % (ref 36.0–46.0)
Hemoglobin: 12.4 g/dL (ref 12.0–15.0)
MCH: 28.7 pg (ref 26.0–34.0)
MCHC: 33.3 g/dL (ref 30.0–36.0)
MCV: 86.1 fL (ref 78.0–100.0)
Platelets: 203 10*3/uL (ref 150–400)
RBC: 4.32 MIL/uL (ref 3.87–5.11)
RDW: 13.7 % (ref 11.5–15.5)
WBC: 4.8 10*3/uL (ref 4.0–10.5)

## 2014-08-27 LAB — I-STAT TROPONIN, ED: TROPONIN I, POC: 0 ng/mL (ref 0.00–0.08)

## 2014-08-27 LAB — URINALYSIS, ROUTINE W REFLEX MICROSCOPIC
Bilirubin Urine: NEGATIVE
Glucose, UA: NEGATIVE mg/dL
Hgb urine dipstick: NEGATIVE
Ketones, ur: 15 mg/dL — AB
LEUKOCYTES UA: NEGATIVE
NITRITE: NEGATIVE
Protein, ur: NEGATIVE mg/dL
SPECIFIC GRAVITY, URINE: 1.034 — AB (ref 1.005–1.030)
Urobilinogen, UA: 0.2 mg/dL (ref 0.0–1.0)
pH: 5.5 (ref 5.0–8.0)

## 2014-08-27 LAB — COMPREHENSIVE METABOLIC PANEL
ALT: 16 U/L (ref 14–54)
AST: 28 U/L (ref 15–41)
Albumin: 3.8 g/dL (ref 3.5–5.0)
Alkaline Phosphatase: 84 U/L (ref 38–126)
Anion gap: 8 (ref 5–15)
BUN: 10 mg/dL (ref 6–20)
CO2: 26 mmol/L (ref 22–32)
CREATININE: 0.81 mg/dL (ref 0.44–1.00)
Calcium: 8.9 mg/dL (ref 8.9–10.3)
Chloride: 103 mmol/L (ref 101–111)
GFR calc non Af Amer: >60 mL/min (ref >60)
Glucose, Bld: 108 mg/dL — ABNORMAL HIGH (ref 65–99)
POTASSIUM: 3.6 mmol/L (ref 3.5–5.1)
SODIUM: 137 mmol/L (ref 135–145)
TOTAL PROTEIN: 7.5 g/dL (ref 6.5–8.1)
Total Bilirubin: 0.3 mg/dL (ref 0.3–1.2)

## 2014-08-27 LAB — DIFFERENTIAL
Basophils Absolute: 0 10*3/uL (ref 0.0–0.1)
Basophils Relative: 0 % (ref 0–1)
EOS ABS: 0.2 10*3/uL (ref 0.0–0.7)
Eosinophils Relative: 4 % (ref 0–5)
LYMPHS ABS: 1.7 10*3/uL (ref 0.7–4.0)
Lymphocytes Relative: 36 % (ref 12–46)
MONOS PCT: 9 % (ref 3–12)
Monocytes Absolute: 0.4 10*3/uL (ref 0.1–1.0)
NEUTROS PCT: 51 % (ref 43–77)
Neutro Abs: 2.4 10*3/uL (ref 1.7–7.7)

## 2014-08-27 LAB — RAPID URINE DRUG SCREEN, HOSP PERFORMED
Amphetamines: NOT DETECTED
BENZODIAZEPINES: NOT DETECTED
Barbiturates: NOT DETECTED
Cocaine: NOT DETECTED
OPIATES: NOT DETECTED
TETRAHYDROCANNABINOL: NOT DETECTED

## 2014-08-27 LAB — ETHANOL: Alcohol, Ethyl (B): <5 mg/dL (ref <5)

## 2014-08-27 LAB — CBG MONITORING, ED: GLUCOSE-CAPILLARY: 114 mg/dL — AB (ref 65–99)

## 2014-08-27 MED ORDER — ASPIRIN 81 MG PO CHEW
81.0000 mg | CHEWABLE_TABLET | Freq: Once | ORAL | Status: AC
  Administered 2014-08-27: 81 mg via ORAL
  Filled 2014-08-27: qty 1

## 2014-08-27 NOTE — ED Notes (Signed)
Pt reporting that she had her phone and a bag that is missing. The only belongings that this RN sees at bedside are a shirt, pants, shoes, and earrings.

## 2014-08-27 NOTE — ED Notes (Signed)
CBG = 114  RN Melissa informed of result.

## 2014-08-27 NOTE — Code Documentation (Signed)
44yo female arriving to [facility] via [place] at 28.  EMS reports that the patient was at work when she had sudden onset right arm weakness and numbness at 1030.  Patient also reports blurred vision that is resolving.  EMS called and activated a Code Stroke.  EMS reports that patient stated a h/o TIA and "mini stroke".  Stroke team at the bedside on arrival.  Complete lab draw unsuccessful and patient cleared by Dr. Mingo Amber.  Patient to CT.  NIHSS 1, see documentation for details and code stroke times.  Exam showing right arm drift, question if this finding is effort dependent.  Dr. Janann Colonel at the bedside.  Patient is too mild to treat at this time, however, she remains in the window to treat with tPA until 1500 should symptoms worsen.  Bedside handoff with ED RN Melissa.

## 2014-08-27 NOTE — ED Notes (Signed)
Pt transported to MRI 

## 2014-08-27 NOTE — ED Notes (Signed)
Pt ambulated to bathroom in room. Pt denies dizziness or wooziness. Pt returned to bed. Monitored by pulse ox, bp cuff, and 12-lead.

## 2014-08-27 NOTE — Discharge Instructions (Signed)
Contact Debbie Smothers tomorrow to schedule a follow-up visit in her office within the next few weeks. There is no evidence of a stroke today. Take aspirin 81 mg daily. Return if concern for any reason

## 2014-08-27 NOTE — ED Notes (Signed)
This RN attempted 2 IV attempts on patient with no success.  Additional RN paged for Ultrasound IV access.

## 2014-08-27 NOTE — Consult Note (Addendum)
Referring Physician: ED MD    Chief Complaint: right arm weakness  HPI:                                                                                                                                         Briana Wade is an 44 y.o. female with history of migraine HA. Patient was at work today when she noted her right arm became numb and then her left arm followed. She noted black spots in her vision.  EMS was called. ON arrival her symptoms had improved but she still felt her right arm had decreased sensation and weakness. CT head was obtained and showed no acute stroke or bleed. BP remained stable at 136/87.  Date last known well: Date: 08/27/2014 Time last known well: Time: 10:30 tPA Given: No: minimal symptoms NIHSS 1 Modified Rankin: Rankin Score=0    Past Medical History  Diagnosis Date  . Other constipation   . Abdominal pain, unspecified site   . Hypertension   . Migraine   . IBS (irritable bowel syndrome)   . Umbilical hernia   . Uterine fibroid   . Chlamydia   . Esophageal reflux   . Personal history of colonic polyps 09/12/2010    tubular adenoma  . Lactose intolerance   . Hiatal hernia   . Diverticulitis   . Allergy   . Anemia   . Asthma     Past Surgical History  Procedure Laterality Date  . Appendectomy    . Tubal ligation    . Appendectomy      Family History  Problem Relation Age of Onset  . Colon cancer Neg Hx   . Esophageal cancer Neg Hx   . Rectal cancer Neg Hx   . Stomach cancer Neg Hx   . Hypertension Mother   . Cancer Mother     not sure of type   Social History:  reports that she has never smoked. She has never used smokeless tobacco. She reports that she drinks alcohol. She reports that she does not use illicit drugs.  Allergies: No Known Allergies  Medications:                                                                                                                           No current facility-administered medications for  this encounter.   Current Outpatient  Prescriptions  Medication Sig Dispense Refill  . albuterol (PROVENTIL HFA;VENTOLIN HFA) 108 (90 BASE) MCG/ACT inhaler Inhale 1-2 puffs into the lungs every 6 (six) hours as needed for wheezing or shortness of breath.    Marland Kitchen ibuprofen (ADVIL,MOTRIN) 200 MG tablet Take 400-800 mg by mouth every 6 (six) hours as needed for moderate pain.     Marland Kitchen losartan-hydrochlorothiazide (HYZAAR) 100-25 MG per tablet Take 1 tablet by mouth daily.    . naproxen (NAPROSYN) 500 MG tablet Take 1 tablet (500 mg total) by mouth 2 (two) times daily. (Patient not taking: Reported on 12/26/2013) 30 tablet 0  . ondansetron (ZOFRAN) 8 MG tablet Take 1 tablet (8 mg total) by mouth every 8 (eight) hours as needed for nausea or vomiting. 20 tablet 0  . oxyCODONE-acetaminophen (PERCOCET/ROXICET) 5-325 MG per tablet Take 1 tablet by mouth every 4 (four) hours as needed for severe pain. 20 tablet 0  . pantoprazole (PROTONIX) 40 MG tablet Take 1 tablet (40 mg total) by mouth daily. (Patient not taking: Reported on 12/26/2013) 90 tablet 3  . pantoprazole (PROTONIX) 40 MG tablet Take 1 tablet (40 mg total) by mouth daily. 30 tablet 0     ROS:                                                                                                                                       History obtained from the patient  General ROS: negative for - chills, fatigue, fever, night sweats, weight gain or weight loss Psychological ROS: negative for - behavioral disorder, hallucinations, memory difficulties, mood swings or suicidal ideation Ophthalmic ROS: negative for - blurry vision, double vision, eye pain or loss of vision ENT ROS: negative for - epistaxis, nasal discharge, oral lesions, sore throat, tinnitus or vertigo Allergy and Immunology ROS: negative for - hives or itchy/watery eyes Hematological and Lymphatic ROS: negative for - bleeding problems, bruising or swollen lymph nodes Endocrine ROS: negative for  - galactorrhea, hair pattern changes, polydipsia/polyuria or temperature intolerance Respiratory ROS: negative for - cough, hemoptysis, shortness of breath or wheezing Cardiovascular ROS: negative for - chest pain, dyspnea on exertion, edema or irregular heartbeat Gastrointestinal ROS: negative for - abdominal pain, diarrhea, hematemesis, nausea/vomiting or stool incontinence Genito-Urinary ROS: negative for - dysuria, hematuria, incontinence or urinary frequency/urgency Musculoskeletal ROS: negative for - joint swelling or muscular weakness Neurological ROS: as noted in HPI Dermatological ROS: negative for rash and skin lesion changes  Neurologic Examination:  Height 5\' 1"  (1.549 m), weight 95.709 kg (211 lb), SpO2 96 %.  HEENT-  Normocephalic, no lesions, without obvious abnormality.  Normal external eye and conjunctiva.  Normal TM's bilaterally.  Normal auditory canals and external ears. Normal external nose, mucus membranes and septum.  Normal pharynx. Cardiovascular- S1, S2 normal, pulses palpable throughout   Lungs- chest clear, no wheezing, rales, normal symmetric air entry Abdomen- normal findings: bowel sounds normal Extremities- no edema Lymph-no adenopathy palpable Musculoskeletal-no joint tenderness, deformity or swelling Skin-warm and dry, no hyperpigmentation, vitiligo, or suspicious lesions  Neurological Examination Mental Status: Alert, oriented, thought content appropriate.  Speech fluent without evidence of aphasia.  Able to follow 3 step commands without difficulty. Cranial Nerves: II: Discs flat bilaterally; Visual fields grossly normal, pupils equal, round, reactive to light and accommodation III,IV, VI: ptosis not present, extra-ocular motions intact bilaterally V,VII: smile symmetric, facial light touch sensation normal bilaterally VIII: hearing normal  bilaterally IX,X: uvula rises symmetrically XI: bilateral shoulder shrug XII: midline tongue extension Motor: Right : Upper extremity   4/5 (poor effort)  Left:     Upper extremity   5/5  Lower extremity   5/5     Lower extremity   5/5 Tone and bulk:normal tone throughout; no atrophy noted Sensory: Pinprick and light touch intact throughout, bilaterally Deep Tendon Reflexes: 2+ and symmetric throughout Plantars: Right: downgoing   Left: downgoing Cerebellar: normal finger-to-nose and normal heel-to-shin test Gait: not tested due to multiple leads       Lab Results: Basic Metabolic Panel:  Recent Labs Lab 08/27/14 1126  NA 139  K 3.9  CL 102  GLUCOSE 101*  BUN 14  CREATININE 0.80    Liver Function Tests: No results for input(s): AST, ALT, ALKPHOS, BILITOT, PROT, ALBUMIN in the last 168 hours. No results for input(s): LIPASE, AMYLASE in the last 168 hours. No results for input(s): AMMONIA in the last 168 hours.  CBC:  Recent Labs Lab 08/27/14 1126  HGB 14.6  HCT 43.0    Cardiac Enzymes: No results for input(s): CKTOTAL, CKMB, CKMBINDEX, TROPONINI in the last 168 hours.  Lipid Panel: No results for input(s): CHOL, TRIG, HDL, CHOLHDL, VLDL, LDLCALC in the last 168 hours.  CBG: No results for input(s): GLUCAP in the last 168 hours.  Microbiology: Results for orders placed or performed during the hospital encounter of 12/26/13  Wet prep, genital     Status: Abnormal   Collection Time: 12/26/13 11:01 AM  Result Value Ref Range Status   Yeast Wet Prep HPF POC NONE SEEN NONE SEEN Final   Trich, Wet Prep NONE SEEN NONE SEEN Final   Clue Cells Wet Prep HPF POC FEW (A) NONE SEEN Final   WBC, Wet Prep HPF POC FEW (A) NONE SEEN Final  GC/Chlamydia Probe Amp (multiple spec sources)     Status: None   Collection Time: 12/26/13 11:01 AM  Result Value Ref Range Status   CT Probe RNA NEGATIVE NEGATIVE Final   GC Probe RNA NEGATIVE NEGATIVE Final    Comment:  (NOTE)                                                                                       **  Normal Reference Range: Negative**      Assay performed using the Gen-Probe APTIMA COMBO2 (R) Assay. Acceptable specimen types for this assay include APTIMA Swabs (Unisex, endocervical, urethral, or vaginal), first void urine, and ThinPrep liquid based cytology samples. Performed at [facility]     Coagulation Studies: No results for input(s): LABPROT, INR in the last 72 hours.  Imaging: No results found.     Assessment and plan discussed with with attending physician and they are in agreement.    Etta Quill PA-C [facility] [phone]  08/27/2014, 11:37 AM   Assessment: 44 y.o. female hx of hypertension and migraine headacehs presenting with right arm weakness and decreased sensation. Exam is notable for poor effort on formal exam of her right arm.  Per patient her symptoms were improving while in ED. tPA not given due to improving and minimal symptoms.    Recommend: 1) MRI brain. If negative no further stroke workup indicated at this time 2) Suggest ASA 81mg  daily

## 2014-08-27 NOTE — ED Notes (Signed)
Pt belongings found in [place].

## 2014-08-27 NOTE — ED Provider Notes (Addendum)
CSN: [id number]     Arrival date & time 08/27/14  1114 History   First MD Initiated Contact with Patient 08/27/14 1137     Chief Complaint  Patient presents with  . Code Stroke   Level V caveat urgency of situation. Code stroke  (Consider location/radiation/quality/duration/timing/severity/associated sxs/prior Treatment) HPI Complains of sudden onset left arm numbness and weakness followed by right arm numbness and weakness and blurred vision 10:30 AM today. Code stroke called in the field. No treatment prior to coming here symptoms improving spontaneously with time, without treatment. Denies pain anywhere. No other associated symptoms. Vision is now back to normal Past Medical History  Diagnosis Date  . Other constipation   . Abdominal pain, unspecified site   . Hypertension   . Migraine   . IBS (irritable bowel syndrome)   . Umbilical hernia   . Uterine fibroid   . Chlamydia   . Esophageal reflux   . Personal history of colonic polyps 09/12/2010    tubular adenoma  . Lactose intolerance   . Hiatal hernia   . Diverticulitis   . Allergy   . Anemia   . Asthma    Past Surgical History  Procedure Laterality Date  . Appendectomy    . Tubal ligation    . Appendectomy     Family History  Problem Relation Age of Onset  . Colon cancer Neg Hx   . Esophageal cancer Neg Hx   . Rectal cancer Neg Hx   . Stomach cancer Neg Hx   . Hypertension Mother   . Cancer Mother     not sure of type   History  Substance Use Topics  . Smoking status: Never Smoker   . Smokeless tobacco: Never Used  . Alcohol Use: Yes     Comment: occasionally   OB History    Gravida Para Term Preterm AB TAB SAB Ectopic Multiple Living   7 3 3  4 3 1   3      Review of Systems  Unable to perform ROS: Acuity of condition  Eyes: Positive for visual disturbance.  Neurological: Positive for weakness and numbness.      Allergies  Review of patient's allergies indicates no known allergies.  Home  Medications   Prior to Admission medications   Medication Sig Start Date End Date Taking? Authorizing Provider  albuterol (PROVENTIL HFA;VENTOLIN HFA) 108 (90 BASE) MCG/ACT inhaler Inhale 1-2 puffs into the lungs every 6 (six) hours as needed for wheezing or shortness of breath.    Historical Provider, MD  ibuprofen (ADVIL,MOTRIN) 200 MG tablet Take 400-800 mg by mouth every 6 (six) hours as needed for moderate pain.     Historical Provider, MD  losartan-hydrochlorothiazide (HYZAAR) 100-25 MG per tablet Take 1 tablet by mouth daily.    Historical Provider, MD  naproxen (NAPROSYN) 500 MG tablet Take 1 tablet (500 mg total) by mouth 2 (two) times daily. Patient not taking: Reported on 12/26/2013 03/07/13   Antonietta Breach, PA-C  ondansetron (ZOFRAN) 8 MG tablet Take 1 tablet (8 mg total) by mouth every 8 (eight) hours as needed for nausea or vomiting. 01/21/14   Daleen Bo, MD  oxyCODONE-acetaminophen (PERCOCET/ROXICET) 5-325 MG per tablet Take 1 tablet by mouth every 4 (four) hours as needed for severe pain. 01/21/14   Daleen Bo, MD  pantoprazole (PROTONIX) 40 MG tablet Take 1 tablet (40 mg total) by mouth daily. Patient not taking: Reported on 12/26/2013 04/07/13   Jerene Bears, MD  pantoprazole (  PROTONIX) 40 MG tablet Take 1 tablet (40 mg total) by mouth daily. 12/26/13   Frazier Richards, MD   Ht 5\' 1"  (1.549 m)  Wt 211 lb (95.709 kg)  BMI 39.89 kg/m2  SpO2 96% Physical Exam  Constitutional: She is oriented to person, place, and time. She appears well-developed and well-nourished.  HENT:  Head: Normocephalic and atraumatic.  Eyes: Conjunctivae are normal. Pupils are equal, round, and reactive to light.  Neck: Neck supple. No tracheal deviation present. No thyromegaly present.  Cardiovascular: Normal rate and regular rhythm.   No murmur heard. Pulmonary/Chest: Effort normal and breath sounds normal.  Abdominal: Soft. Bowel sounds are normal. She exhibits no distension. There is no tenderness.   Obese  Musculoskeletal: Normal range of motion. She exhibits no edema or tenderness.  Neurological: She is alert and oriented to person, place, and time. She has normal reflexes. No cranial nerve deficit. Coordination normal.  DTRs symmetric bilaterally knee jerk ankle jerk biceps was ordered bilaterally  Skin: Skin is warm and dry. No rash noted.  Psychiatric: She has a normal mood and affect.  Nursing note and vitals reviewed.   ED Course  Procedures (including critical care time) Labs Review Labs Reviewed  I-STAT CHEM 8, ED - Abnormal; Notable for the following:    Glucose, Bld 101 (*)    All other components within normal limits  ETHANOL  PROTIME-INR  APTT  CBC  DIFFERENTIAL  COMPREHENSIVE METABOLIC PANEL  URINE RAPID DRUG SCREEN, HOSP PERFORMED  URINALYSIS, ROUTINE W REFLEX MICROSCOPIC (NOT AT [facility])  I-STAT TROPOININ, ED    Imaging Review No results found.   EKG Interpretation   Date/Time:  Monday August 27 2014 11:36:45 EDT Ventricular Rate:  58 PR Interval:  159 QRS Duration: 74 QT Interval:  439 QTC Calculation: 431 R Axis:   -10 Text Interpretation:  Sinus rhythm Left ventricular hypertrophy Probable  anterior infarct, age indeterminate Since last tracing rate slower  Confirmed by Winfred Leeds  MD, Kimberlie Csaszar [phone]) on 08/27/2014 11:42:13 AM     4:50 PM patient asymptomatic. Alert ambulatory without difficulty.  Results for orders placed or performed during the hospital encounter of 08/27/14  Ethanol  Result Value Ref Range   Alcohol, Ethyl (B) <5 <5 mg/dL  CBC  Result Value Ref Range   WBC 4.8 4.0 - 10.5 K/uL   RBC 4.32 3.87 - 5.11 MIL/uL   Hemoglobin 12.4 12.0 - 15.0 g/dL   HCT 37.2 36.0 - 46.0 %   MCV 86.1 78.0 - 100.0 fL   MCH 28.7 26.0 - 34.0 pg   MCHC 33.3 30.0 - 36.0 g/dL   RDW 13.7 11.5 - 15.5 %   Platelets 203 150 - 400 K/uL  Differential  Result Value Ref Range   Neutrophils Relative % 51 43 - 77 %   Neutro Abs 2.4 1.7 - 7.7 K/uL    Lymphocytes Relative 36 12 - 46 %   Lymphs Abs 1.7 0.7 - 4.0 K/uL   Monocytes Relative 9 3 - 12 %   Monocytes Absolute 0.4 0.1 - 1.0 K/uL   Eosinophils Relative 4 0 - 5 %   Eosinophils Absolute 0.2 0.0 - 0.7 K/uL   Basophils Relative 0 0 - 1 %   Basophils Absolute 0.0 0.0 - 0.1 K/uL  Comprehensive metabolic panel  Result Value Ref Range   Sodium 137 135 - 145 mmol/L   Potassium 3.6 3.5 - 5.1 mmol/L   Chloride 103 101 - 111 mmol/L  CO2 26 22 - 32 mmol/L   Glucose, Bld 108 (H) 65 - 99 mg/dL   BUN 10 6 - 20 mg/dL   Creatinine, Ser 0.81 0.44 - 1.00 mg/dL   Calcium 8.9 8.9 - 10.3 mg/dL   Total Protein 7.5 6.5 - 8.1 g/dL   Albumin 3.8 3.5 - 5.0 g/dL   AST 28 15 - 41 U/L   ALT 16 14 - 54 U/L   Alkaline Phosphatase 84 38 - 126 U/L   Total Bilirubin 0.3 0.3 - 1.2 mg/dL   GFR calc non Af Amer >60 >60 mL/min   GFR calc Af Amer >60 >60 mL/min   Anion gap 8 5 - 15  Urine rapid drug screen (hosp performed)not at [facility]  Result Value Ref Range   Opiates NONE DETECTED NONE DETECTED   Cocaine NONE DETECTED NONE DETECTED   Benzodiazepines NONE DETECTED NONE DETECTED   Amphetamines NONE DETECTED NONE DETECTED   Tetrahydrocannabinol NONE DETECTED NONE DETECTED   Barbiturates NONE DETECTED NONE DETECTED  Urinalysis, Routine w reflex microscopic (not at [facility])  Result Value Ref Range   Color, Urine YELLOW YELLOW   APPearance CLEAR CLEAR   Specific Gravity, Urine 1.034 (H) 1.005 - 1.030   pH 5.5 5.0 - 8.0   Glucose, UA NEGATIVE NEGATIVE mg/dL   Hgb urine dipstick NEGATIVE NEGATIVE   Bilirubin Urine NEGATIVE NEGATIVE   Ketones, ur 15 (A) NEGATIVE mg/dL   Protein, ur NEGATIVE NEGATIVE mg/dL   Urobilinogen, UA 0.2 0.0 - 1.0 mg/dL   Nitrite NEGATIVE NEGATIVE   Leukocytes, UA NEGATIVE NEGATIVE  I-Stat Chem 8, ED  (not at [facility])  Result Value Ref Range   Sodium 139 135 - 145 mmol/L   Potassium 3.9 3.5 - 5.1 mmol/L   Chloride 102 101 - 111 mmol/L   BUN 14 6 - 20 mg/dL   Creatinine, Ser  0.80 0.44 - 1.00 mg/dL   Glucose, Bld 101 (H) 65 - 99 mg/dL   Calcium, Ion 1.12 1.12 - 1.23 mmol/L   TCO2 25 0 - 100 mmol/L   Hemoglobin 14.6 12.0 - 15.0 g/dL   HCT 43.0 36.0 - 46.0 %  I-stat troponin, ED (not at [facility])  Result Value Ref Range   Troponin i, poc 0.00 0.00 - 0.08 ng/mL   Comment 3          CBG monitoring, ED  Result Value Ref Range   Glucose-Capillary 114 (H) 65 - 99 mg/dL   Ct Head Wo Contrast  08/27/2014   CLINICAL DATA:  Right arm weakness  EXAM: CT HEAD WITHOUT CONTRAST  TECHNIQUE: Contiguous axial images were obtained from the base of the skull through the vertex without intravenous contrast.  COMPARISON:  Brain MRI 07/23/2010. 09/27/2013 head CT is currently not available.  FINDINGS: Skull and Sinuses:Negative for fracture or destructive process. The mastoids, middle ears, and imaged paranasal sinuses are clear.  Orbits: No acute abnormality.  Brain: No evidence of acute infarction, hemorrhage, hydrocephalus, or mass lesion/mass effect.  Mccluskey matter low-density, focally prominent in the bilateral external capsules and subinsular Teichert matter is stable since 2012. The pattern is nonspecific, not typical of demyelinating disease or chronic small vessel ischemia.  Critical Value/emergent results were called by telephone at the time of interpretation on 08/27/2014 at 11:35 am to Dr. Janann Colonel, who verbally acknowledged these results.  IMPRESSION: 1. No acute intracranial findings. 2. 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   Electronically Signed   By: Angelica Chessman  Watts M.D.   On: 08/27/2014 11:37   Mr Brain Wo Contrast  08/27/2014   CLINICAL DATA:  Personal history of migraine headaches. Numbness of the right arm in the left hand beginning today. Visual disturbance.  EXAM: MRI HEAD WITHOUT CONTRAST  TECHNIQUE: Multiplanar, multiecho pulse sequences of the brain and surrounding structures were obtained without intravenous contrast.  COMPARISON:  Head CT same day.  MRI  07/23/2010.  FINDINGS: Diffusion imaging does not show any acute or subacute infarction. The brainstem and cerebellum are normal. The cerebral hemispheres are normal except for a few scattered foci of T2 and FLAIR signal in the Fernholz matter, possibly migraine related foci. No cortical or large vessel territory insult. No mass lesion, hemorrhage, hydrocephalus or extra-axial collection. There is arachnoid herniation into the sella. This can be an normal variant or associated with increased intracranial pressures. No fluid in the sinuses, middle ears or of significance in the mastoid air cells. Tiny amount of fluid in a few of the air cells at the mastoid tips, probably subclinical.  IMPRESSION: No evidence of acute or subacute infarction.  Scattered foci of T2 and FLAIR signal in the hemispheric Kutner matter, most likely migraine related foci given the patient's history.   Electronically Signed   By: Nelson Chimes M.D.   On: 08/27/2014 14:53    MDM  Spoke with neurology service . Plan aspirin 81 mg daily. I suspect that patient suffered migraine equivalent  Diagnosis weakness  Final diagnoses:  Right arm weakness        Orlie Dakin, MD 08/27/14 1657  Orlie Dakin, MD 08/27/14 1659

## 2014-08-27 NOTE — ED Notes (Signed)
Per EMS- Pt was at work- reports sudden onset numbness to bilateral arms, left resolving before right. Pt also reports blurry vision. Pt reports that all symptoms have resolved at this time, but she was still having them when she came in to the ED. Pt a/o x 4. Airway intact. NAD.

## 2015-03-25 ENCOUNTER — Encounter: Payer: Self-pay | Admitting: *Deleted

## 2015-04-02 ENCOUNTER — Ambulatory Visit: Payer: Medicaid Other | Admitting: Internal Medicine

## 2015-04-05 ENCOUNTER — Encounter: Payer: Self-pay | Admitting: Internal Medicine

## 2015-04-05 ENCOUNTER — Ambulatory Visit (INDEPENDENT_AMBULATORY_CARE_PROVIDER_SITE_OTHER): Payer: Self-pay | Admitting: Internal Medicine

## 2015-04-05 VITALS — BP 120/70 | HR 64 | Ht 60.25 in | Wt 218.0 lb

## 2015-04-05 DIAGNOSIS — R1012 Left upper quadrant pain: Secondary | ICD-10-CM

## 2015-04-05 DIAGNOSIS — K5909 Other constipation: Secondary | ICD-10-CM

## 2015-04-05 DIAGNOSIS — R140 Abdominal distension (gaseous): Secondary | ICD-10-CM

## 2015-04-05 DIAGNOSIS — K219 Gastro-esophageal reflux disease without esophagitis: Secondary | ICD-10-CM

## 2015-04-05 DIAGNOSIS — K449 Diaphragmatic hernia without obstruction or gangrene: Secondary | ICD-10-CM

## 2015-04-05 MED ORDER — RANITIDINE HCL 150 MG PO TABS: 150.0000 mg | ORAL_TABLET | Freq: Two times a day (BID) | ORAL | Status: DC

## 2015-04-05 MED ORDER — POLYETHYLENE GLYCOL 3350 17 GM/SCOOP PO POWD: 1.0000 | Freq: Every day | ORAL | Status: DC

## 2015-04-05 MED ORDER — MESALAMINE 4 G RE ENEM: 4.0000 g | ENEMA | Freq: Every day | RECTAL | Status: DC

## 2015-04-05 MED ORDER — PANTOPRAZOLE SODIUM 40 MG PO TBEC: 40.0000 mg | DELAYED_RELEASE_TABLET | Freq: Every day | ORAL | Status: DC

## 2015-04-05 NOTE — Patient Instructions (Addendum)
  We have sent the following medications to your pharmacy for you to pick up at your convenience:  Protonix  Use Miralax daily - 17gm or one capful in juice or water  Follow up in 3 months

## 2015-04-05 NOTE — Progress Notes (Signed)
   Subjective:    Patient ID: Briana Wade, female    DOB: 21-Feb-1970, 45 y.o.   MRN: [id number]  HPI Briana Wade is a 45 year old female with history of GERD, reflux esophagitis, hiatal hernia, adenomatous colon polyp, diverticulosis with history of diverticulitis, umbilical hernia, IBS, lactose intolerance is here for follow-up. She was last seen 04/07/2013. She reports that she's been struggling recently with return of heartburn. She's developed left upper quadrant abdominal discomfort along with bloating fullness and constipation. All symptoms worsened after she lost her medical insurance and has been off of her PPI therapy. She has since obtained Medicaid. She reports the left upper quadrant pain feels like a "knot" and is worse at times with movement and at times with eating. She has some mild nausea but no vomiting. She does have burning substernal pain typical for her reflux. No dysphagia. No weight loss. Abdominal bloating worse with constipation. She can go 3-4 days between bowel movements. Stools are often hard. Can be difficult to pass. She denies rectal bleeding or melena. Denies blood in her stool. She did try over-the-counter laxative for a few days without great result.   Review of Systems As per history of present illness, otherwise negative  Current Medications, Allergies, Past Medical History, Past Surgical History, Family History and Social History were reviewed in [employer] record.     Objective:   Physical Exam BP 120/70 mmHg  Pulse 64  Ht 5' 0.25" (1.53 m)  Wt 218 lb (98.884 kg)  BMI 42.24 kg/m2  LMP 03/14/2015 (Approximate) Constitutional: Well-developed and well-nourished. No distress. HEENT: Normocephalic and atraumatic. Oropharynx is clear and moist. No oropharyngeal exudate. Conjunctivae are normal.  No scleral icterus. Neck: Neck supple. Trachea midline. Cardiovascular: Normal rate, regular rhythm and intact distal pulses. No  M/R/G Pulmonary/chest: Effort normal and breath sounds normal. No wheezing, rales or rhonchi. Abdominal: Soft, Upper abdominal tenderness without rebound or guarding, nondistended. Bowel sounds active throughout. There are no masses palpable, though reducible umbilical hernia Extremities: no clubbing, cyanosis, or edema Neurological: Alert and oriented to person place and time. Skin: Skin is warm and dry. No rashes noted. Psychiatric: Normal mood and affect. Behavior is normal.      Assessment & Plan:  45 year old female with history of GERD, reflux esophagitis, hiatal hernia, adenomatous colon polyp, diverticulosis with history of diverticulitis, umbilical hernia, IBS, lactose intolerance is here for follow-up.  1. GERD/LUQ pain -- History of esophagitis and GERD. Previously very responsive to PPI. I recommended that we resume pantoprazole 40 mg daily. Avoid NSAIDs. She could have Cameron's lesion/erosions or even gastritis leading to symptoms. If unresponsive to PPI at follow-up my recommendation would be to repeat upper endoscopy. We discussed GERD diet low bloating diet.  2. Constipation -- exacerbating bloating. Also likely related IBS. I recommended MiraLAX 17 g on a scheduled basis. Call if no improvement. If not consider Linzess. High-fiber diet recommended.  3. History of adenomatous colon polyp -- repeat colonoscopy recommended January 2020  25 minutes spent with the patient today. Greater than 50% was spent in counseling and coordination of care with the patient

## 2015-04-11 ENCOUNTER — Other Ambulatory Visit: Payer: Self-pay | Admitting: Family

## 2015-04-11 DIAGNOSIS — Z1231 Encounter for screening mammogram for malignant neoplasm of breast: Secondary | ICD-10-CM

## 2015-04-25 ENCOUNTER — Ambulatory Visit (HOSPITAL_COMMUNITY): Payer: Self-pay

## 2015-05-09 ENCOUNTER — Ambulatory Visit
Admission: RE | Admit: 2015-05-09 | Discharge: 2015-05-09 | Disposition: A | Discharge status: Home / Self Care | Payer: Medicaid Other | Source: Ambulatory Visit | Attending: Family | Admitting: Family

## 2015-05-09 DIAGNOSIS — Z1231 Encounter for screening mammogram for malignant neoplasm of breast: Secondary | ICD-10-CM

## 2015-05-21 ENCOUNTER — Inpatient Hospital Stay (HOSPITAL_COMMUNITY)
Admission: EM | Admit: 2015-05-21 | Discharge: 2015-05-25 | DRG: 392 | Disposition: A | Discharge status: Home / Self Care | Payer: Medicaid Other | Attending: Family Medicine | Admitting: Family Medicine

## 2015-05-21 ENCOUNTER — Encounter (HOSPITAL_COMMUNITY): Payer: Self-pay

## 2015-05-21 ENCOUNTER — Emergency Department (HOSPITAL_COMMUNITY): Payer: Medicaid Other

## 2015-05-21 DIAGNOSIS — E669 Obesity, unspecified: Secondary | ICD-10-CM | POA: Diagnosis present

## 2015-05-21 DIAGNOSIS — K5730 Diverticulosis of large intestine without perforation or abscess without bleeding: Secondary | ICD-10-CM | POA: Diagnosis present

## 2015-05-21 DIAGNOSIS — Z79899 Other long term (current) drug therapy: Secondary | ICD-10-CM

## 2015-05-21 DIAGNOSIS — J45909 Unspecified asthma, uncomplicated: Secondary | ICD-10-CM | POA: Diagnosis present

## 2015-05-21 DIAGNOSIS — Z9049 Acquired absence of other specified parts of digestive tract: Secondary | ICD-10-CM

## 2015-05-21 DIAGNOSIS — K449 Diaphragmatic hernia without obstruction or gangrene: Secondary | ICD-10-CM | POA: Diagnosis present

## 2015-05-21 DIAGNOSIS — K5732 Diverticulitis of large intestine without perforation or abscess without bleeding: Secondary | ICD-10-CM | POA: Diagnosis not present

## 2015-05-21 DIAGNOSIS — K219 Gastro-esophageal reflux disease without esophagitis: Secondary | ICD-10-CM | POA: Diagnosis present

## 2015-05-21 DIAGNOSIS — K589 Irritable bowel syndrome without diarrhea: Secondary | ICD-10-CM | POA: Diagnosis present

## 2015-05-21 DIAGNOSIS — Z7951 Long term (current) use of inhaled steroids: Secondary | ICD-10-CM

## 2015-05-21 DIAGNOSIS — K5792 Diverticulitis of intestine, part unspecified, without perforation or abscess without bleeding: Secondary | ICD-10-CM | POA: Diagnosis present

## 2015-05-21 DIAGNOSIS — I10 Essential (primary) hypertension: Secondary | ICD-10-CM | POA: Diagnosis present

## 2015-05-21 DIAGNOSIS — Z6841 Body Mass Index (BMI) 40.0 and over, adult: Secondary | ICD-10-CM

## 2015-05-21 DIAGNOSIS — E739 Lactose intolerance, unspecified: Secondary | ICD-10-CM | POA: Diagnosis present

## 2015-05-21 DIAGNOSIS — N760 Acute vaginitis: Secondary | ICD-10-CM | POA: Diagnosis present

## 2015-05-21 LAB — CBC
HEMATOCRIT: 36.6 % (ref 36.0–46.0)
HEMOGLOBIN: 12 g/dL (ref 12.0–15.0)
MCH: 27.5 pg (ref 26.0–34.0)
MCHC: 32.8 g/dL (ref 30.0–36.0)
MCV: 83.9 fL (ref 78.0–100.0)
Platelets: 291 10*3/uL (ref 150–400)
RBC: 4.36 MIL/uL (ref 3.87–5.11)
RDW: 13.9 % (ref 11.5–15.5)
WBC: 6.4 10*3/uL (ref 4.0–10.5)

## 2015-05-21 LAB — URINALYSIS, ROUTINE W REFLEX MICROSCOPIC
BILIRUBIN URINE: NEGATIVE
Glucose, UA: NEGATIVE mg/dL
HGB URINE DIPSTICK: NEGATIVE
Ketones, ur: NEGATIVE mg/dL
Leukocytes, UA: NEGATIVE
Nitrite: NEGATIVE
PH: 6 (ref 5.0–8.0)
Protein, ur: NEGATIVE mg/dL
SPECIFIC GRAVITY, URINE: 1.025 (ref 1.005–1.030)

## 2015-05-21 LAB — COMPREHENSIVE METABOLIC PANEL
ALBUMIN: 3.8 g/dL (ref 3.5–5.0)
ALT: 14 U/L (ref 14–54)
AST: 19 U/L (ref 15–41)
Alkaline Phosphatase: 72 U/L (ref 38–126)
Anion gap: 8 (ref 5–15)
BUN: 11 mg/dL (ref 6–20)
CO2: 25 mmol/L (ref 22–32)
Calcium: 9.1 mg/dL (ref 8.9–10.3)
Chloride: 106 mmol/L (ref 101–111)
Creatinine, Ser: 0.84 mg/dL (ref 0.44–1.00)
GFR calc Af Amer: >60 mL/min (ref >60)
GFR calc non Af Amer: >60 mL/min (ref >60)
GLUCOSE: 132 mg/dL — AB (ref 65–99)
POTASSIUM: 3.7 mmol/L (ref 3.5–5.1)
SODIUM: 139 mmol/L (ref 135–145)
Total Bilirubin: 0.5 mg/dL (ref 0.3–1.2)
Total Protein: 7.5 g/dL (ref 6.5–8.1)

## 2015-05-21 LAB — LIPASE, BLOOD: Lipase: 22 U/L (ref 11–51)

## 2015-05-21 LAB — HIV ANTIBODY (ROUTINE TESTING W REFLEX): HIV Screen 4th Generation wRfx: NONREACTIVE

## 2015-05-21 LAB — WET PREP, GENITAL
Sperm: NONE SEEN
Trich, Wet Prep: NONE SEEN
Yeast Wet Prep HPF POC: NONE SEEN

## 2015-05-21 LAB — HCG, QUANTITATIVE, PREGNANCY: hCG, Beta Chain, Quant, S: <1 m[IU]/mL (ref <5)

## 2015-05-21 LAB — RPR: RPR Ser Ql: NONREACTIVE

## 2015-05-21 MED ORDER — MORPHINE SULFATE (PF) 4 MG/ML IV SOLN: 4.0000 mg | Freq: Once | INTRAVENOUS | Status: DC

## 2015-05-21 MED ORDER — PANTOPRAZOLE SODIUM 40 MG PO TBEC
40.0000 mg | DELAYED_RELEASE_TABLET | Freq: Every day | ORAL | Status: DC
  Administered 2015-05-22: 40 mg via ORAL
  Filled 2015-05-21 (×3): qty 1

## 2015-05-21 MED ORDER — HYDROMORPHONE HCL 1 MG/ML IJ SOLN
1.0000 mg | Freq: Once | INTRAMUSCULAR | Status: AC
  Administered 2015-05-21: 1 mg via INTRAMUSCULAR
  Filled 2015-05-21: qty 1

## 2015-05-21 MED ORDER — ACETAMINOPHEN 650 MG RE SUPP: 650.0000 mg | Freq: Four times a day (QID) | RECTAL | Status: DC | PRN

## 2015-05-21 MED ORDER — METRONIDAZOLE IN NACL 5-0.79 MG/ML-% IV SOLN
500.0000 mg | Freq: Once | INTRAVENOUS | Status: AC
  Administered 2015-05-21: 500 mg via INTRAVENOUS
  Filled 2015-05-21: qty 100

## 2015-05-21 MED ORDER — ENOXAPARIN SODIUM 40 MG/0.4ML ~~LOC~~ SOLN
40.0000 mg | SUBCUTANEOUS | Status: DC
  Administered 2015-05-21 – 2015-05-24 (×4): 40 mg via SUBCUTANEOUS
  Filled 2015-05-21 (×4): qty 0.4

## 2015-05-21 MED ORDER — ACETAMINOPHEN 325 MG PO TABS
650.0000 mg | ORAL_TABLET | Freq: Four times a day (QID) | ORAL | Status: DC | PRN
Start: 2015-05-21 — End: 2015-05-25

## 2015-05-21 MED ORDER — SODIUM CHLORIDE 0.9 % IV SOLN
INTRAVENOUS | Status: DC
  Administered 2015-05-22 – 2015-05-25 (×4): via INTRAVENOUS

## 2015-05-21 MED ORDER — DIATRIZOATE MEGLUMINE & SODIUM 66-10 % PO SOLN: 30.0000 mL | Freq: Once | ORAL | Status: DC

## 2015-05-21 MED ORDER — SODIUM CHLORIDE 0.9 % IV BOLUS (SEPSIS)
500.0000 mL | Freq: Once | INTRAVENOUS | Status: AC
  Administered 2015-05-21: 500 mL via INTRAVENOUS

## 2015-05-21 MED ORDER — METRONIDAZOLE IN NACL 5-0.79 MG/ML-% IV SOLN
500.0000 mg | Freq: Three times a day (TID) | INTRAVENOUS | Status: DC
  Administered 2015-05-21 – 2015-05-25 (×11): 500 mg via INTRAVENOUS
  Filled 2015-05-21 (×12): qty 100

## 2015-05-21 MED ORDER — ONDANSETRON HCL 4 MG PO TABS: 4.0000 mg | ORAL_TABLET | Freq: Four times a day (QID) | ORAL | Status: DC | PRN

## 2015-05-21 MED ORDER — SODIUM CHLORIDE 0.9 % IV SOLN: 250.0000 mL | INTRAVENOUS | Status: DC | PRN

## 2015-05-21 MED ORDER — HYDROCODONE-ACETAMINOPHEN 5-325 MG PO TABS
1.0000 | ORAL_TABLET | ORAL | Status: DC | PRN
  Administered 2015-05-21 – 2015-05-22 (×2): 2 via ORAL
  Filled 2015-05-21 (×2): qty 2

## 2015-05-21 MED ORDER — LOSARTAN POTASSIUM 50 MG PO TABS
50.0000 mg | ORAL_TABLET | Freq: Every day | ORAL | Status: DC
  Administered 2015-05-22: 50 mg via ORAL
  Filled 2015-05-21 (×3): qty 1

## 2015-05-21 MED ORDER — MORPHINE SULFATE (PF) 2 MG/ML IV SOLN
2.0000 mg | INTRAVENOUS | Status: DC | PRN
  Administered 2015-05-21 – 2015-05-22 (×2): 2 mg via INTRAVENOUS
  Filled 2015-05-21 (×2): qty 1

## 2015-05-21 MED ORDER — ALBUTEROL SULFATE (2.5 MG/3ML) 0.083% IN NEBU
2.5000 mg | INHALATION_SOLUTION | Freq: Four times a day (QID) | RESPIRATORY_TRACT | Status: DC | PRN
  Administered 2015-05-22 – 2015-05-24 (×2): 2.5 mg via RESPIRATORY_TRACT
  Filled 2015-05-21 (×2): qty 3

## 2015-05-21 MED ORDER — CIPROFLOXACIN IN D5W 400 MG/200ML IV SOLN
400.0000 mg | Freq: Two times a day (BID) | INTRAVENOUS | Status: DC
  Administered 2015-05-22 – 2015-05-25 (×7): 400 mg via INTRAVENOUS
  Filled 2015-05-21 (×7): qty 200

## 2015-05-21 MED ORDER — BUDESONIDE 0.25 MG/2ML IN SUSP
0.2500 mg | Freq: Two times a day (BID) | RESPIRATORY_TRACT | Status: DC
  Administered 2015-05-21 – 2015-05-22 (×2): 0.25 mg via RESPIRATORY_TRACT
  Filled 2015-05-21 (×5): qty 2

## 2015-05-21 MED ORDER — ONDANSETRON 8 MG PO TBDP
8.0000 mg | ORAL_TABLET | Freq: Once | ORAL | Status: AC
  Administered 2015-05-21: 8 mg via ORAL
  Filled 2015-05-21: qty 1

## 2015-05-21 MED ORDER — ONDANSETRON HCL 4 MG/2ML IJ SOLN
4.0000 mg | Freq: Four times a day (QID) | INTRAMUSCULAR | Status: DC | PRN
  Administered 2015-05-22 – 2015-05-24 (×4): 4 mg via INTRAVENOUS
  Filled 2015-05-21 (×4): qty 2

## 2015-05-21 MED ORDER — OXYCODONE-ACETAMINOPHEN 5-325 MG PO TABS: 2.0000 | ORAL_TABLET | Freq: Once | ORAL | Status: DC

## 2015-05-21 MED ORDER — ONDANSETRON HCL 4 MG/2ML IJ SOLN
4.0000 mg | Freq: Once | INTRAMUSCULAR | Status: DC
Start: 2015-05-21 — End: 2015-05-21

## 2015-05-21 MED ORDER — SODIUM CHLORIDE 0.9% FLUSH: 3.0000 mL | INTRAVENOUS | Status: DC | PRN

## 2015-05-21 MED ORDER — MORPHINE SULFATE (PF) 4 MG/ML IV SOLN
4.0000 mg | Freq: Once | INTRAVENOUS | Status: AC
  Administered 2015-05-21: 4 mg via INTRAMUSCULAR
  Filled 2015-05-21: qty 1

## 2015-05-21 MED ORDER — SODIUM CHLORIDE 0.9% FLUSH
3.0000 mL | Freq: Two times a day (BID) | INTRAVENOUS | Status: DC
  Administered 2015-05-21 – 2015-05-24 (×5): 3 mL via INTRAVENOUS

## 2015-05-21 MED ORDER — SODIUM CHLORIDE 0.9 % IV SOLN
INTRAVENOUS | Status: AC
  Administered 2015-05-21: 16:00:00 via INTRAVENOUS

## 2015-05-21 MED ORDER — CIPROFLOXACIN IN D5W 400 MG/200ML IV SOLN
400.0000 mg | Freq: Once | INTRAVENOUS | Status: AC
  Administered 2015-05-21: 400 mg via INTRAVENOUS
  Filled 2015-05-21 (×2): qty 200

## 2015-05-21 MED ORDER — MONTELUKAST SODIUM 10 MG PO TABS
10.0000 mg | ORAL_TABLET | Freq: Every day | ORAL | Status: DC
  Administered 2015-05-22: 10 mg via ORAL
  Filled 2015-05-21 (×3): qty 1

## 2015-05-21 NOTE — ED Provider Notes (Signed)
  Face-to-face evaluation   History:  She complains of left lower abdominal pain present since yesterday, worsening, associated with nausea but no vomiting. She denies diarrhea or blood in stool. There is been no fever or chills. Her pain feels like it did previously when she had diverticulitis.  Physical exam: Alert, calm, uncomfortable. Abdomen soft, moderate left lower quadrant tenderness.  Assessment- uncomplicated diverticulitis with severe abdominal pain. She will require admission for stabilization.  2:26 PM-Consult complete with hospitalist. Patient case explained and discussed. She agrees to admit patient for further evaluation and treatment. Call ended at 15:50  Medical screening examination/treatment/procedure(s) were conducted as a shared visit with non-physician practitioner(s) and myself.  I personally evaluated the patient during the encounter  Daleen Bo, MD 05/21/15 [phone]

## 2015-05-21 NOTE — ED Notes (Signed)
Patient reports that she has not had her BP meds today.

## 2015-05-21 NOTE — ED Provider Notes (Signed)
CSN: [id number]     Arrival date & time 05/21/15  [id number] History   First MD Initiated Contact with Patient 05/21/15 0719     Chief Complaint  Patient presents with  . Abdominal Pain     (Consider location/radiation/quality/duration/timing/severity/associated sxs/prior Treatment) HPI Comments: Patient is a 45 year old female history of diverticulitis, IBS, GERD who presents with lower abdominal pain. The patient states she began feeling similar pain last week that has resolved. The patient states that the pain returned yesterday morning and was intermittent. She states that the pain became constant last evening after work. Patient states that the pain is across the front of her abdomen and radiates to each side, but is worse in the left side. Patient has had associated nausea, but no vomiting. Patient has had an increased frequency of bowel movements for the past one week. Patient's LMP 1-1/2 weeks ago. Patient denies any vaginal discharge or foul odor. Patient is sexually active in does not use birth control. Patient requests STD check and has concern for exposure. Patient denies any chest pain, shortness of breath, dysuria. Patient states her pain feels similar to episodes of diverticulitis in the past.  Patient is a 45 y.o. female presenting with abdominal pain. The history is provided by the patient.  Abdominal Pain Associated symptoms: nausea   Associated symptoms: no chest pain, no chills, no dysuria, no fever, no shortness of breath, no sore throat and no vomiting     Past Medical History  Diagnosis Date  . Other constipation   . Abdominal pain, unspecified site   . Hypertension   . Migraine   . IBS (irritable bowel syndrome)   . Umbilical hernia   . Uterine fibroid   . Chlamydia   . Esophageal reflux   . Personal history of colonic polyps 09/12/2010    tubular adenoma  . Lactose intolerance   . Hiatal hernia   . Diverticulitis   . Allergy   . Anemia   . Asthma   .  Diverticulosis   . Tubular adenoma of colon    Past Surgical History  Procedure Laterality Date  . Appendectomy    . Tubal ligation    . Appendectomy     Family History  Problem Relation Age of Onset  . Colon cancer Neg Hx   . Esophageal cancer Neg Hx   . Rectal cancer Neg Hx   . Stomach cancer Neg Hx   . Hypertension Mother   . Cancer Mother     not sure of type   Social History  Substance Use Topics  . Smoking status: Never Smoker   . Smokeless tobacco: Never Used  . Alcohol Use: Yes     Comment: occasionally   OB History    Gravida Para Term Preterm AB TAB SAB Ectopic Multiple Living   7 3 3  4 3 1   3      Review of Systems  Constitutional: Negative for fever and chills.  HENT: Negative for facial swelling and sore throat.   Respiratory: Negative for shortness of breath.   Cardiovascular: Negative for chest pain.  Gastrointestinal: Positive for nausea and abdominal pain. Negative for vomiting.  Genitourinary: Negative for dysuria.  Musculoskeletal: Negative for back pain.  Skin: Negative for rash and wound.  Neurological: Negative for headaches.  Psychiatric/Behavioral: The patient is not nervous/anxious.       Allergies  Review of patient's allergies indicates no known allergies.  Home Medications   Prior to Admission medications  Medication Sig Start Date End Date Taking? Authorizing Provider  albuterol (PROVENTIL HFA;VENTOLIN HFA) 108 (90 BASE) MCG/ACT inhaler Inhale 1-2 puffs into the lungs every 6 (six) hours as needed for wheezing or shortness of breath. Reported on 04/05/2015   Yes Historical Provider, MD  losartan (COZAAR) 50 MG tablet Take 50 mg by mouth daily. 04/16/15  Yes Historical Provider, MD  mesalamine (ROWASA) 4 g enema Place 60 mLs (4 g total) rectally at bedtime. 04/05/15  Yes Jerene Bears, MD  montelukast (SINGULAIR) 10 MG tablet Take 10 mg by mouth daily.  04/09/15  Yes Historical Provider, MD  pantoprazole (PROTONIX) 40 MG tablet Take 1  tablet (40 mg total) by mouth daily. 04/05/15  Yes Jerene Bears, MD  QVAR 40 MCG/ACT inhaler Inhale 3 puffs into the lungs 2 (two) times daily. 04/09/15  Yes Historical Provider, MD  polyethylene glycol powder (GLYCOLAX/MIRALAX) powder Take 255 g by mouth daily. Patient not taking: Reported on 05/21/2015 04/05/15   Jerene Bears, MD  ranitidine (ZANTAC) 150 MG tablet Take 1 tablet (150 mg total) by mouth 2 (two) times daily. Patient not taking: Reported on 05/21/2015 04/05/15   Jerene Bears, MD   BP 143/97 mmHg  Pulse 69  Temp(Src) 97.5 F (36.4 C) (Oral)  Resp 18  Ht 5\' 1"  (1.549 m)  Wt 98.884 kg  BMI 41.21 kg/m2  SpO2 95%  LMP 05/05/2015 Physical Exam  Constitutional: She is oriented to person, place, and time. She appears well-developed and well-nourished. No distress.  HENT:  Head: Normocephalic and atraumatic.  Mouth/Throat: Oropharynx is clear and moist. No oropharyngeal exudate.  Eyes: Conjunctivae are normal. Pupils are equal, round, and reactive to light. Right eye exhibits no discharge. Left eye exhibits no discharge. No scleral icterus.  Neck: Normal range of motion. Neck supple. No thyromegaly present.  Cardiovascular: Normal rate, regular rhythm, normal heart sounds and intact distal pulses.  Exam reveals no gallop and no friction rub.   No murmur heard. Pulmonary/Chest: Effort normal and breath sounds normal. No stridor. No respiratory distress. She has no wheezes. She has no rales.  Abdominal: Soft. Bowel sounds are normal. She exhibits no distension. There is tenderness in the right lower quadrant, suprapubic area and left lower quadrant. There is no rebound, no guarding and no CVA tenderness.    Genitourinary: Uterus is tender. Uterus is not enlarged. Cervix exhibits discharge. Cervix exhibits no motion tenderness and no friability. Right adnexum displays tenderness. Right adnexum displays no mass and no fullness. Left adnexum displays tenderness. Left adnexum displays no mass  and no fullness.  Musculoskeletal: She exhibits no edema.  Lymphadenopathy:    She has no cervical adenopathy.  Neurological: She is alert and oriented to person, place, and time. Coordination normal.  Skin: Skin is warm and dry. No rash noted. She is not diaphoretic. No pallor.  Psychiatric: She has a normal mood and affect. Her behavior is normal. Judgment and thought content normal.  Nursing note and vitals reviewed.   ED Course  Procedures (including critical care time) Labs Review Labs Reviewed  WET PREP, GENITAL - Abnormal; Notable for the following:    Clue Cells Wet Prep HPF POC PRESENT (*)    WBC, Wet Prep HPF POC FEW (*)    All other components within normal limits  COMPREHENSIVE METABOLIC PANEL - Abnormal; Notable for the following:    Glucose, Bld 132 (*)    All other components within normal limits  URINE CULTURE  LIPASE,  BLOOD  CBC  URINALYSIS, ROUTINE W REFLEX MICROSCOPIC (NOT AT [facility])  RPR  HIV ANTIBODY (ROUTINE TESTING)  HCG, QUANTITATIVE, PREGNANCY  GC/CHLAMYDIA PROBE AMP ([place]) NOT AT [facility]    Imaging Review Ct Abdomen Pelvis Wo Contrast  05/21/2015  CLINICAL DATA:  Lower abdominal pain radiating to the left side since last night. History of diverticulitis. EXAM: CT ABDOMEN AND PELVIS WITHOUT CONTRAST TECHNIQUE: Multidetector CT imaging of the abdomen and pelvis was performed following the standard protocol without IV contrast. COMPARISON:  05/06/2012 FINDINGS: Minimal atelectasis is noted in the right lung base. The liver, spleen, gallbladder, adrenal glands, kidneys, and pancreas have an unremarkable unenhanced appearance. A moderate-size sliding hiatal hernia is noted. Oral contrast is present in the stomach, duodenum, and colon. There is no evidence of bowel obstruction. Left-sided colonic diverticulosis is again seen. There is new wall thickening and mild surrounding inflammatory stranding involving the proximal sigmoid colon. No extraluminal gas or  fluid collection is identified. Appendix is absent. Trace free fluid is noted in the pelvis. The uterus and ovaries are grossly unremarkable. Bladder is unremarkable. No enlarged lymph nodes are identified. A small fat containing umbilical hernia is unchanged. No acute osseous abnormality is identified. IMPRESSION: Acute sigmoid diverticulitis. No evidence of perforation or abscess. Electronically Signed   By: Logan Bores M.D.   On: 05/21/2015 11:55   I have personally reviewed and evaluated these images and lab results as part of my medical decision-making.   EKG Interpretation None      MDM   UA negative. Wet prep shows clue cells and few WBCs. Lipase 22. CBC, CMP unremarkable. HCG negative. CT abdomen and pelvis shows acute sigmoid diverticulitis; no evidence of perforation or abscess. IV team failed after 2 attempts to place IV. Patient's pain unable to be controlled in the ED with multiple IM injections. Dr. Eulis Foster consulted medicine who will admit the patient to med-surg for transfer of care and inpatient treatment.  Final diagnoses:  Diverticulitis of large intestine without perforation or abscess without bleeding       Frederica Kuster, PA-C 05/21/15 [place], PA-C 05/21/15 [employer], MD 05/21/15 [phone]

## 2015-05-21 NOTE — ED Notes (Signed)
Pt complains of lower abd pain that radiates to her left side since last night, hx of diverticulitis

## 2015-05-21 NOTE — ED Notes (Signed)
IV Team has not arrived. Patient asking for pain meds. Mentioned to patient that the EDPA coul;d probably order pain med to be given IM, but patient declined. Patient stated, "I will just wait until IV Team comes and starts the IV.

## 2015-05-21 NOTE — ED Notes (Signed)
Patient transported to CT 

## 2015-05-21 NOTE — ED Notes (Signed)
IV Team attempted x 2 to start IV with no success.

## 2015-05-21 NOTE — ED Notes (Signed)
Pt called out wanting pain medication.  Upon entering room, pt was found to be tearing up and holding on to the left lower abd with pain.  Cristie Hem, PA made aware.

## 2015-05-21 NOTE — H&P (Signed)
Briana Wade History and Physical  [name] [id number] DOB: 1970/10/25 DOA: 05/21/2015  Referring physician: Dr. Kristeen Miss, EDP PCP: Smothers, Andree Elk, NP  Specialists: Dr. Hilarie Fredrickson, gastoenterology Patient coming from: Home  Chief Complaint: Abdominal pain  HPI: Briana Wade is a 45 y.o. female with a medical history of IBS, hypertension, acid reflux, presented to the emergency department with complaints of abdominal pain.  Patient states that her abdominal pain started yesterday evening after work. She does not remember eating anything out of the ordinary. She denies any diarrhea. She did not try taking anything for her abdominal pain. Patient does endorse a history of IBS.  She feels that she is having more frequent bowel movements. Patient also is concerned regarding STDs. Her last menstrual cycle approximately 1-1/2 weeks ago. She denies any problems with urination, vaginal discharge or odor. Currently, she denies any chest pain, dizziness, headache, shortness of breath, ill contacts or recent travel.  ED Course: Found to have acute diverticulitis on CT scan. Started on Cipro and Flagyl. Was having intractable pain.  Review of Systems:  As per HPI otherwise 10 point review of systems negative.   Past Medical History  Diagnosis Date  . Other constipation   . Abdominal pain, unspecified site   . Hypertension   . Migraine   . IBS (irritable bowel syndrome)   . Umbilical hernia   . Uterine fibroid   . Chlamydia   . Esophageal reflux   . Personal history of colonic polyps 09/12/2010    tubular adenoma  . Lactose intolerance   . Hiatal hernia   . Diverticulitis   . Allergy   . Anemia   . Asthma   . Diverticulosis   . Tubular adenoma of colon     Past Surgical History  Procedure Laterality Date  . Appendectomy    . Tubal ligation    . Appendectomy      Social History:  reports that she has never smoked. She has never used smokeless tobacco. She  reports that she drinks alcohol. She reports that she does not use illicit drugs.   No Known Allergies  Family History  Problem Relation Age of Onset  . Colon cancer Neg Hx   . Esophageal cancer Neg Hx   . Rectal cancer Neg Hx   . Stomach cancer Neg Hx   . Hypertension Mother   . Cancer Mother     not sure of type     Prior to Admission medications   Medication Sig Start Date End Date Taking? Authorizing Provider  albuterol (PROVENTIL HFA;VENTOLIN HFA) 108 (90 BASE) MCG/ACT inhaler Inhale 1-2 puffs into the lungs every 6 (six) hours as needed for wheezing or shortness of breath. Reported on 04/05/2015   Yes Historical Provider, MD  losartan (COZAAR) 50 MG tablet Take 50 mg by mouth daily. 04/16/15  Yes Historical Provider, MD  mesalamine (ROWASA) 4 g enema Place 60 mLs (4 g total) rectally at bedtime. 04/05/15  Yes Jerene Bears, MD  montelukast (SINGULAIR) 10 MG tablet Take 10 mg by mouth daily.  04/09/15  Yes Historical Provider, MD  pantoprazole (PROTONIX) 40 MG tablet Take 1 tablet (40 mg total) by mouth daily. 04/05/15  Yes Jerene Bears, MD  QVAR 40 MCG/ACT inhaler Inhale 3 puffs into the lungs 2 (two) times daily. 04/09/15  Yes Historical Provider, MD  polyethylene glycol powder (GLYCOLAX/MIRALAX) powder Take 255 g by mouth daily. Patient not taking: Reported on 05/21/2015 04/05/15  Jerene Bears, MD  ranitidine (ZANTAC) 150 MG tablet Take 1 tablet (150 mg total) by mouth 2 (two) times daily. Patient not taking: Reported on 05/21/2015 04/05/15   Jerene Bears, MD    Physical Exam: Filed Vitals:   05/21/15 1522 05/21/15 1541  BP: 151/89 134/89  Pulse: 69 67  Temp:  97.9 F (36.6 C)  Resp: 18 18     General: Well developed, well nourished, NAD, appears stated age  HEENT: NCAT, PERRLA, EOMI, Anicteic Sclera, mucous membranes moist.   Neck: Supple, no JVD, no masses  Cardiovascular: S1 S2 auscultated, no rubs, murmurs or gallops. Regular rate and rhythm.  Respiratory: Clear to  auscultation bilaterally with equal chest rise  Abdomen: Soft, obese, diffusely TTP- more so LLQ, nondistended, + bowel sounds  Extremities: warm dry without cyanosis clubbing or edema  Neuro: AAOx3, cranial nerves grossly intact. Strength 5/5 in patient's upper and lower extremities bilaterally  Skin: Without rashes exudates or nodules  Psych: Normal affect and demeanor with intact judgement and insight  Labs on Admission: I have personally reviewed following labs and imaging studies CBC:  Recent Labs Lab 05/21/15 0647  WBC 6.4  HGB 12.0  HCT 36.6  MCV 83.9  PLT [id number]   Basic Metabolic Panel:  Recent Labs Lab 05/21/15 0647  NA 139  K 3.7  CL 106  CO2 25  GLUCOSE 132*  BUN 11  CREATININE 0.84  CALCIUM 9.1   GFR: Estimated Creatinine Clearance: 91.1 mL/min (by C-G formula based on Cr of 0.84). Liver Function Tests:  Recent Labs Lab 05/21/15 0647  AST 19  ALT 14  ALKPHOS 72  BILITOT 0.5  PROT 7.5  ALBUMIN 3.8    Recent Labs Lab 05/21/15 0647  LIPASE 22   No results for input(s): AMMONIA in the last 168 hours. Coagulation Profile: No results for input(s): INR, PROTIME in the last 168 hours. Cardiac Enzymes: No results for input(s): CKTOTAL, CKMB, CKMBINDEX, TROPONINI in the last 168 hours. BNP (last 3 results) No results for input(s): PROBNP in the last 8760 hours. HbA1C: No results for input(s): HGBA1C in the last 72 hours. CBG: No results for input(s): GLUCAP in the last 168 hours. Lipid Profile: No results for input(s): CHOL, HDL, LDLCALC, TRIG, CHOLHDL, LDLDIRECT in the last 72 hours. Thyroid Function Tests: No results for input(s): TSH, T4TOTAL, FREET4, T3FREE, THYROIDAB in the last 72 hours. Anemia Panel: No results for input(s): VITAMINB12, FOLATE, FERRITIN, TIBC, IRON, RETICCTPCT in the last 72 hours. Urine analysis:    Component Value Date/Time   COLORURINE YELLOW 05/21/2015 0813   APPEARANCEUR CLEAR 05/21/2015 0813   LABSPEC 1.025  05/21/2015 0813   PHURINE 6.0 05/21/2015 0813   GLUCOSEU NEGATIVE 05/21/2015 0813   HGBUR NEGATIVE 05/21/2015 0813   BILIRUBINUR NEGATIVE 05/21/2015 0813   KETONESUR NEGATIVE 05/21/2015 0813   PROTEINUR NEGATIVE 05/21/2015 0813   UROBILINOGEN 0.2 08/27/2014 1348   NITRITE NEGATIVE 05/21/2015 0813   LEUKOCYTESUR NEGATIVE 05/21/2015 0813   Sepsis Labs: @LABRCNTIP (procalcitonin:4,lacticidven:4) ) Recent Results (from the past 240 hour(s))  Wet prep, genital     Status: Abnormal   Collection Time: 05/21/15  8:13 AM  Result Value Ref Range Status   Yeast Wet Prep HPF POC NONE SEEN NONE SEEN Final   Trich, Wet Prep NONE SEEN NONE SEEN Final   Clue Cells Wet Prep HPF POC PRESENT (A) NONE SEEN Final   WBC, Wet Prep HPF POC FEW (A) NONE SEEN Final   Sperm NONE SEEN  Final     Radiological Exams on Admission: Ct Abdomen Pelvis Wo Contrast  05/21/2015  CLINICAL DATA:  Lower abdominal pain radiating to the left side since last night. History of diverticulitis. EXAM: CT ABDOMEN AND PELVIS WITHOUT CONTRAST TECHNIQUE: Multidetector CT imaging of the abdomen and pelvis was performed following the standard protocol without IV contrast. COMPARISON:  05/06/2012 FINDINGS: Minimal atelectasis is noted in the right lung base. The liver, spleen, gallbladder, adrenal glands, kidneys, and pancreas have an unremarkable unenhanced appearance. A moderate-size sliding hiatal hernia is noted. Oral contrast is present in the stomach, duodenum, and colon. There is no evidence of bowel obstruction. Left-sided colonic diverticulosis is again seen. There is new wall thickening and mild surrounding inflammatory stranding involving the proximal sigmoid colon. No extraluminal gas or fluid collection is identified. Appendix is absent. Trace free fluid is noted in the pelvis. The uterus and ovaries are grossly unremarkable. Bladder is unremarkable. No enlarged lymph nodes are identified. A small fat containing umbilical hernia  is unchanged. No acute osseous abnormality is identified. IMPRESSION: Acute sigmoid diverticulitis. No evidence of perforation or abscess. Electronically Signed   By: Logan Bores M.D.   On: 05/21/2015 11:55     EKG: None  Assessment/Plan  Acute diverticulitis -Patient presenting with left lower quadrant pain -CT scan of the abdomen and pelvis showed acute sigmoid diverticulitis, no evidence of perforation or abscess -Patient currently has no leukocytosis and is afebrile -Will order pain control, Cipro and Flagyl -Please note, the patient does have hydrocodone at home which she was given for dental pain. -Patient does follow with Dr. Raquel James, gastroenterologist  Bacterial vaginosis -Patient thought she had a STD, she was worked up in the emergency department. -HIV, RPR nonreactive. Wet prep showed clue cells -Patient will be receiving Flagyl for diverticulitis which should cover her bacterial vaginosis.  GERD -Continue PPI  Asthma -Continue Medications  Essential hypertension -Continue losartan  DVT prophylaxis: Lovenox  Code Status: Full  Family Communication: Son at bedside. Admission, patients condition and plan of care including tests being ordered have been discussed with the patient and son who indicate understanding and agree with the plan and Code Status.  Disposition Plan: Admitted to medical floor   Consults called: None   Admission status: obsevation   Time spent: 60 minutes  Izumi Mixon D.O. Briana Wade Pager [phone]  If 7PM-7AM, please contact night-coverage [email or web address] Password [facility] 05/21/2015, 4:36 PM

## 2015-05-22 DIAGNOSIS — K5792 Diverticulitis of intestine, part unspecified, without perforation or abscess without bleeding: Secondary | ICD-10-CM | POA: Diagnosis not present

## 2015-05-22 LAB — CBC
HEMATOCRIT: 35.5 % — AB (ref 36.0–46.0)
HEMOGLOBIN: 11.6 g/dL — AB (ref 12.0–15.0)
MCH: 28.2 pg (ref 26.0–34.0)
MCHC: 32.7 g/dL (ref 30.0–36.0)
MCV: 86.4 fL (ref 78.0–100.0)
Platelets: 319 10*3/uL (ref 150–400)
RBC: 4.11 MIL/uL (ref 3.87–5.11)
RDW: 14 % (ref 11.5–15.5)
WBC: 6.9 10*3/uL (ref 4.0–10.5)

## 2015-05-22 LAB — URINE CULTURE: Culture: 8000 — AB

## 2015-05-22 LAB — BASIC METABOLIC PANEL
ANION GAP: 6 (ref 5–15)
BUN: 9 mg/dL (ref 6–20)
CALCIUM: 8.5 mg/dL — AB (ref 8.9–10.3)
CHLORIDE: 105 mmol/L (ref 101–111)
CO2: 28 mmol/L (ref 22–32)
Creatinine, Ser: 0.78 mg/dL (ref 0.44–1.00)
GFR calc Af Amer: >60 mL/min (ref >60)
GFR calc non Af Amer: >60 mL/min (ref >60)
GLUCOSE: 125 mg/dL — AB (ref 65–99)
POTASSIUM: 4.3 mmol/L (ref 3.5–5.1)
Sodium: 139 mmol/L (ref 135–145)

## 2015-05-22 LAB — GC/CHLAMYDIA PROBE AMP (~~LOC~~) NOT AT ARMC
Chlamydia: NEGATIVE
Neisseria Gonorrhea: NEGATIVE

## 2015-05-22 MED ORDER — HYDROMORPHONE HCL 1 MG/ML IJ SOLN
1.0000 mg | INTRAMUSCULAR | Status: DC | PRN
  Filled 2015-05-22: qty 1

## 2015-05-22 MED ORDER — MORPHINE SULFATE (PF) 2 MG/ML IV SOLN: 2.0000 mg | INTRAVENOUS | Status: DC | PRN

## 2015-05-22 MED ORDER — OXYCODONE-ACETAMINOPHEN 5-325 MG PO TABS
1.0000 | ORAL_TABLET | ORAL | Status: DC
  Administered 2015-05-22: 2 via ORAL
  Administered 2015-05-24 – 2015-05-25 (×3): 1 via ORAL
  Filled 2015-05-22: qty 1
  Filled 2015-05-22: qty 2
  Filled 2015-05-22 (×2): qty 1
  Filled 2015-05-22 (×2): qty 2
  Filled 2015-05-22 (×2): qty 1

## 2015-05-22 MED ORDER — HYDROCODONE-ACETAMINOPHEN 5-325 MG PO TABS
1.0000 | ORAL_TABLET | ORAL | Status: DC
  Administered 2015-05-22: 2 via ORAL
  Filled 2015-05-22: qty 2

## 2015-05-22 MED ORDER — HYDROMORPHONE HCL 1 MG/ML IJ SOLN
1.0000 mg | INTRAMUSCULAR | Status: DC | PRN
  Administered 2015-05-22 – 2015-05-23 (×4): 1 mg via INTRAVENOUS
  Filled 2015-05-22 (×3): qty 1

## 2015-05-22 MED ORDER — MORPHINE SULFATE (PF) 2 MG/ML IV SOLN
2.0000 mg | Freq: Three times a day (TID) | INTRAVENOUS | Status: DC | PRN
  Administered 2015-05-22: 2 mg via INTRAVENOUS
  Filled 2015-05-22: qty 1

## 2015-05-22 NOTE — Progress Notes (Signed)
PROGRESS NOTE    Briana Wade  [id number] DOB: [birth year] DOA: 05/21/2015 PCP: Junie Panning, NP  Outpatient Specialists:     Brief Narrative:  45 y/o ? IBS Prior Diverticulitis htn Asthma  Admit 4/26 acute abd pain, found to have acute sigmoid diverticulitis   Assessment & Plan:   Principal Problem:   Acute diverticulitis At present time `Continue empiric ciprofloxacin/Flagyl IV `Graduated diet slowly `Pain control today with IV Dilaudid and Percocet - if severe pain or inability to keep down food and nausea vomiting, would scan abdomen again however she appears comfortable -Have also explained to patient Will D escalate to oral pain medications in the next 24-48 hours. -Saline 125-->75 cc/h    IBS (irritable bowel syndrome)/Chr LUQ pain,    Lactose intolerance  Prior adenomatous polyp   EGD linear erosions, 4 cm hiatal hernia+  Colonoscopy to the cecum was normal except for left-sided diverticulosis. Internal hemorrhoids were seen.1.28.15  -appears workup has been started by Dr. Elmo Putt with regards to either gluten insensitivity and IBS  -No underlying psychopathology that patient complains of at present time  -? OP Nissen re-eval?   high-risk sexual behavior  -Patient expressed an interest with regards to getting "checked out" for an STD despite no symptoms of discharge  -She appears to be having extramarital relations and patient was counseled regarding barrier methods of protection    GERD (gastroesophageal reflux disease) -protonix 40 qd    HTN (hypertension) -Cont Losartan 50 qd    Asthma -quiescent     DVT prophylaxis: Lovenox Code Status: Full Family Communication: none +.  Patient capable of updating them herself Disposition Plan: still unclear-when pain is adeqautley controlled   Consultants:   none  Procedures:   none  Antimicrobials:    cipor + flagyll 4/25    Subjective: Some pain Morphine doesn't work Tolerated  fruit cup No other issue No vomit Tells me no vag d/c nor bleeding  Objective: Filed Vitals:   05/21/15 2130 05/22/15 0605 05/22/15 0737 05/22/15 0741  BP: 141/102 126/78    Pulse: 64 65    Temp: 97.4 F (36.3 C) 98 F (36.7 C)    TempSrc: Oral Oral    Resp: 18 18    Height:      Weight:      SpO2: 100% 99% 97% 97%    Intake/Output Summary (Last 24 hours) at 05/22/15 1644 Last data filed at 05/22/15 1556  Gross per 24 hour  Intake 929.58 ml  Output    350 ml  Net 579.58 ml   Filed Weights   05/21/15 0642 05/21/15 1541  Weight: 98.884 kg (218 lb) 98.884 kg (218 lb)    Examination:  General exam: Appears calm and comfortable  Respiratory system: Clear to auscultation. Respiratory effort normal. Cardiovascular system: S1 & S2 heard, RRR. No JVD, murmurs, rubs, gallops or clicks. No pedal edema. Gastrointestinal system: Abdomen is nondistended, soft and nontender. No organomegaly or masses felt. Normal bowel sounds heard. Central nervous system: Alert and oriented. No focal neurological deficits. Extremities: Symmetric 5 x 5 power. Skin: No rashes, lesions or ulcers Psychiatry: Judgement and insight appear normal. Mood & affect appropriate.     Data Reviewed: I have personally reviewed following labs and imaging studies  CBC:  Recent Labs Lab 05/21/15 0647 05/22/15 0346  WBC 6.4 6.9  HGB 12.0 11.6*  HCT 36.6 35.5*  MCV 83.9 86.4  PLT 291 [id number]   Basic Metabolic Panel:  Recent Labs Lab  05/21/15 0647 05/22/15 0346  NA 139 139  K 3.7 4.3  CL 106 105  CO2 25 28  GLUCOSE 132* 125*  BUN 11 9  CREATININE 0.84 0.78  CALCIUM 9.1 8.5*   GFR: Estimated Creatinine Clearance: 95.6 mL/min (by C-G formula based on Cr of 0.78). Liver Function Tests:  Recent Labs Lab 05/21/15 0647  AST 19  ALT 14  ALKPHOS 72  BILITOT 0.5  PROT 7.5  ALBUMIN 3.8    Recent Labs Lab 05/21/15 0647  LIPASE 22   No results for input(s): AMMONIA in the last 168  hours. Coagulation Profile: No results for input(s): INR, PROTIME in the last 168 hours. Cardiac Enzymes: No results for input(s): CKTOTAL, CKMB, CKMBINDEX, TROPONINI in the last 168 hours. BNP (last 3 results) No results for input(s): PROBNP in the last 8760 hours. HbA1C: No results for input(s): HGBA1C in the last 72 hours. CBG: No results for input(s): GLUCAP in the last 168 hours. Lipid Profile: No results for input(s): CHOL, HDL, LDLCALC, TRIG, CHOLHDL, LDLDIRECT in the last 72 hours. Thyroid Function Tests: No results for input(s): TSH, T4TOTAL, FREET4, T3FREE, THYROIDAB in the last 72 hours. Anemia Panel: No results for input(s): VITAMINB12, FOLATE, FERRITIN, TIBC, IRON, RETICCTPCT in the last 72 hours. Urine analysis:    Component Value Date/Time   COLORURINE YELLOW 05/21/2015 0813   APPEARANCEUR CLEAR 05/21/2015 0813   LABSPEC 1.025 05/21/2015 0813   PHURINE 6.0 05/21/2015 0813   GLUCOSEU NEGATIVE 05/21/2015 0813   HGBUR NEGATIVE 05/21/2015 0813   BILIRUBINUR NEGATIVE 05/21/2015 0813   KETONESUR NEGATIVE 05/21/2015 0813   PROTEINUR NEGATIVE 05/21/2015 0813   UROBILINOGEN 0.2 08/27/2014 1348   NITRITE NEGATIVE 05/21/2015 0813   LEUKOCYTESUR NEGATIVE 05/21/2015 0813   Sepsis Labs: @LABRCNTIP (procalcitonin:4,lacticidven:4)  ) Recent Results (from the past 240 hour(s))  Urine culture     Status: Abnormal   Collection Time: 05/21/15  8:13 AM  Result Value Ref Range Status   Specimen Description URINE, CLEAN CATCH  Final   Special Requests NONE  Final   Culture (A)  Final    8,000 COLONIES/mL INSIGNIFICANT GROWTH Performed at [facility]    Report Status 05/22/2015 FINAL  Final  Wet prep, genital     Status: Abnormal   Collection Time: 05/21/15  8:13 AM  Result Value Ref Range Status   Yeast Wet Prep HPF POC NONE SEEN NONE SEEN Final   Trich, Wet Prep NONE SEEN NONE SEEN Final   Clue Cells Wet Prep HPF POC PRESENT (A) NONE SEEN Final   WBC, Wet Prep  HPF POC FEW (A) NONE SEEN Final   Sperm NONE SEEN  Final         Radiology Studies: Ct Abdomen Pelvis Wo Contrast  05/21/2015  CLINICAL DATA:  Lower abdominal pain radiating to the left side since last night. History of diverticulitis. EXAM: CT ABDOMEN AND PELVIS WITHOUT CONTRAST TECHNIQUE: Multidetector CT imaging of the abdomen and pelvis was performed following the standard protocol without IV contrast. COMPARISON:  05/06/2012 FINDINGS: Minimal atelectasis is noted in the right lung base. The liver, spleen, gallbladder, adrenal glands, kidneys, and pancreas have an unremarkable unenhanced appearance. A moderate-size sliding hiatal hernia is noted. Oral contrast is present in the stomach, duodenum, and colon. There is no evidence of bowel obstruction. Left-sided colonic diverticulosis is again seen. There is new wall thickening and mild surrounding inflammatory stranding involving the proximal sigmoid colon. No extraluminal gas or fluid collection is identified. Appendix is absent.  Trace free fluid is noted in the pelvis. The uterus and ovaries are grossly unremarkable. Bladder is unremarkable. No enlarged lymph nodes are identified. A small fat containing umbilical hernia is unchanged. No acute osseous abnormality is identified. IMPRESSION: Acute sigmoid diverticulitis. No evidence of perforation or abscess. Electronically Signed   By: Logan Bores M.D.   On: 05/21/2015 11:55        Scheduled Meds: . budesonide (PULMICORT) nebulizer solution  0.25 mg Nebulization BID  . ciprofloxacin  400 mg Intravenous Q12H  . diatrizoate meglumine-sodium  30 mL Oral Once  . enoxaparin (LOVENOX) injection  40 mg Subcutaneous Q24H  . losartan  50 mg Oral Daily  . metronidazole  500 mg Intravenous Q8H  . montelukast  10 mg Oral Daily  . oxyCODONE-acetaminophen  1-2 tablet Oral Q4H  . pantoprazole  40 mg Oral Daily  . sodium chloride flush  3 mL Intravenous Q12H   Continuous Infusions: . sodium  chloride 125 mL/hr at 05/22/15 1554        Time spent: [place], MD [facility] ([facility]   If 7PM-7AM, please contact night-coverage [email or web address] Password [facility] 05/22/2015, 4:44 PM

## 2015-05-23 DIAGNOSIS — I10 Essential (primary) hypertension: Secondary | ICD-10-CM | POA: Diagnosis present

## 2015-05-23 DIAGNOSIS — K5792 Diverticulitis of intestine, part unspecified, without perforation or abscess without bleeding: Secondary | ICD-10-CM | POA: Diagnosis not present

## 2015-05-23 DIAGNOSIS — K219 Gastro-esophageal reflux disease without esophagitis: Secondary | ICD-10-CM | POA: Diagnosis present

## 2015-05-23 DIAGNOSIS — Z79899 Other long term (current) drug therapy: Secondary | ICD-10-CM | POA: Diagnosis not present

## 2015-05-23 DIAGNOSIS — E739 Lactose intolerance, unspecified: Secondary | ICD-10-CM | POA: Diagnosis present

## 2015-05-23 DIAGNOSIS — K449 Diaphragmatic hernia without obstruction or gangrene: Secondary | ICD-10-CM | POA: Diagnosis present

## 2015-05-23 DIAGNOSIS — K589 Irritable bowel syndrome without diarrhea: Secondary | ICD-10-CM | POA: Diagnosis present

## 2015-05-23 DIAGNOSIS — K5732 Diverticulitis of large intestine without perforation or abscess without bleeding: Secondary | ICD-10-CM | POA: Diagnosis not present

## 2015-05-23 DIAGNOSIS — N760 Acute vaginitis: Secondary | ICD-10-CM | POA: Diagnosis present

## 2015-05-23 DIAGNOSIS — E669 Obesity, unspecified: Secondary | ICD-10-CM | POA: Diagnosis present

## 2015-05-23 DIAGNOSIS — Z9049 Acquired absence of other specified parts of digestive tract: Secondary | ICD-10-CM | POA: Diagnosis not present

## 2015-05-23 DIAGNOSIS — Z6841 Body Mass Index (BMI) 40.0 and over, adult: Secondary | ICD-10-CM | POA: Diagnosis not present

## 2015-05-23 DIAGNOSIS — Z7951 Long term (current) use of inhaled steroids: Secondary | ICD-10-CM | POA: Diagnosis not present

## 2015-05-23 DIAGNOSIS — J45909 Unspecified asthma, uncomplicated: Secondary | ICD-10-CM | POA: Diagnosis present

## 2015-05-23 LAB — CBC WITH DIFFERENTIAL/PLATELET
BASOS ABS: 0 10*3/uL (ref 0.0–0.1)
Basophils Relative: 0 %
EOS PCT: 1 %
Eosinophils Absolute: 0.1 10*3/uL (ref 0.0–0.7)
HCT: 34 % — ABNORMAL LOW (ref 36.0–46.0)
Hemoglobin: 11 g/dL — ABNORMAL LOW (ref 12.0–15.0)
LYMPHS ABS: 1.3 10*3/uL (ref 0.7–4.0)
Lymphocytes Relative: 23 %
MCH: 27.8 pg (ref 26.0–34.0)
MCHC: 32.4 g/dL (ref 30.0–36.0)
MCV: 85.9 fL (ref 78.0–100.0)
Monocytes Absolute: 0.6 10*3/uL (ref 0.1–1.0)
Monocytes Relative: 10 %
Neutro Abs: 3.9 10*3/uL (ref 1.7–7.7)
Neutrophils Relative %: 66 %
PLATELETS: 299 10*3/uL (ref 150–400)
RBC: 3.96 MIL/uL (ref 3.87–5.11)
RDW: 13.9 % (ref 11.5–15.5)
WBC: 5.9 10*3/uL (ref 4.0–10.5)

## 2015-05-23 MED ORDER — HYDRALAZINE HCL 20 MG/ML IJ SOLN
10.0000 mg | INTRAMUSCULAR | Status: DC | PRN
Start: 2015-05-23 — End: 2015-05-25
  Administered 2015-05-23 – 2015-05-24 (×2): 10 mg via INTRAVENOUS
  Filled 2015-05-23 (×2): qty 1

## 2015-05-23 MED ORDER — PROCHLORPERAZINE EDISYLATE 5 MG/ML IJ SOLN
10.0000 mg | Freq: Four times a day (QID) | INTRAMUSCULAR | Status: AC
Start: 2015-05-23 — End: 2015-05-24
  Administered 2015-05-23 – 2015-05-24 (×4): 10 mg via INTRAVENOUS
  Filled 2015-05-23 (×4): qty 2

## 2015-05-23 NOTE — Progress Notes (Signed)
PROGRESS NOTE    Briana Wade  [id number] DOB: [birth year] DOA: 05/21/2015 PCP: Junie Panning, NP  Outpatient Specialists:     Brief Narrative:  45 y/o ? IBS Prior Diverticulitis htn Asthma  Admit 4/26 acute abd pain, found to have acute sigmoid diverticulitis   Assessment & Plan:   Principal Problem:   Acute diverticulitis At present time `Continue empiric ciprofloxacin/Flagyl IV `backed down to CLD 05/23/2015 `Pain control IV Dilaudid and Percocet - still pain, but she appears comfortable -Changed Zofran-->Compazine 05/23/15 -Saline 125-->75 cc/h    IBS (irritable bowel syndrome)/Chr LUQ pain,    Lactose intolerance  Prior adenomatous polyp   EGD linear erosions, 4 cm hiatal hernia+  Colonoscopy to the cecum was normal except for left-sided diverticulosis. Internal hemorrhoids were seen.1.28.15  -appears workup has been started by Dr. Hilarie Fredrickson with regards to either gluten insensitivity and IBS  -No underlying psychopathology that patient complains of at present time  -? OP Nissen re-eval?   high-risk sexual behavior  -Patient expressed an interest with regards to getting "checked out" for an STD despite no symptoms of discharge     GERD (gastroesophageal reflux disease) -protonix 40 qd    HTN (hypertension) -Cont Losartan 50 qd    Asthma -quiescent     DVT prophylaxis: Lovenox Code Status: Full Family Communication: none +.  Patient capable of updating them herself Disposition Plan: still unclear-when pain ctrll and taking po reliably   Consultants:   none  Procedures:   none  Antimicrobials:    cipor + flagyll 4/25    Subjective:  Nausea most of the night Poor po intake No diarr No fever no chills  Objective: Filed Vitals:   05/22/15 2031 05/23/15 0501 05/23/15 0836 05/23/15 1355  BP: 130/70 163/94 154/90 162/93  Pulse: 78 82  77  Temp: 98.3 F (36.8 C) 98.4 F (36.9 C)  99.2 F (37.3 C)  TempSrc: Oral Oral  Oral    Resp: 16 16  15   Height:      Weight:      SpO2: 97% 100%  100%    Intake/Output Summary (Last 24 hours) at 05/23/15 1555 Last data filed at 05/23/15 1355  Gross per 24 hour  Intake    120 ml  Output    350 ml  Net   -230 ml   Filed Weights   05/21/15 0642 05/21/15 1541  Weight: 98.884 kg (218 lb) 98.884 kg (218 lb)    Examination:  General exam: in some painful distress Respiratory system: Clear to auscultation. Respiratory effort normal. Cardiovascular system: S1 & S2 heard, RRR. No JVD, murmurs, rubs, gallops or clicks. No pedal edema. Gastrointestinal system: Abdomen is slightly distended with some discomfort primarily in RLQ NO rebound, no guard Psychiatry: Judgement and insight appear normal. Mood & affect appropriate.     Data Reviewed: I have personally reviewed following labs and imaging studies  CBC:  Recent Labs Lab 05/21/15 0647 05/22/15 0346 05/23/15 0836  WBC 6.4 6.9 5.9  NEUTROABS  --   --  3.9  HGB 12.0 11.6* 11.0*  HCT 36.6 35.5* 34.0*  MCV 83.9 86.4 85.9  PLT 291 319 [id number]   Basic Metabolic Panel:  Recent Labs Lab 05/21/15 0647 05/22/15 0346  NA 139 139  K 3.7 4.3  CL 106 105  CO2 25 28  GLUCOSE 132* 125*  BUN 11 9  CREATININE 0.84 0.78  CALCIUM 9.1 8.5*   GFR: Estimated Creatinine Clearance: 95.6 mL/min (by C-G  formula based on Cr of 0.78). Liver Function Tests:  Recent Labs Lab 05/21/15 0647  AST 19  ALT 14  ALKPHOS 72  BILITOT 0.5  PROT 7.5  ALBUMIN 3.8    Recent Labs Lab 05/21/15 0647  LIPASE 22   No results for input(s): AMMONIA in the last 168 hours. Coagulation Profile: No results for input(s): INR, PROTIME in the last 168 hours. Cardiac Enzymes: No results for input(s): CKTOTAL, CKMB, CKMBINDEX, TROPONINI in the last 168 hours. BNP (last 3 results) No results for input(s): PROBNP in the last 8760 hours. HbA1C: No results for input(s): HGBA1C in the last 72 hours. CBG: No results for input(s): GLUCAP in  the last 168 hours. Lipid Profile: No results for input(s): CHOL, HDL, LDLCALC, TRIG, CHOLHDL, LDLDIRECT in the last 72 hours. Thyroid Function Tests: No results for input(s): TSH, T4TOTAL, FREET4, T3FREE, THYROIDAB in the last 72 hours. Anemia Panel: No results for input(s): VITAMINB12, FOLATE, FERRITIN, TIBC, IRON, RETICCTPCT in the last 72 hours. Urine analysis:    Component Value Date/Time   COLORURINE YELLOW 05/21/2015 0813   APPEARANCEUR CLEAR 05/21/2015 0813   LABSPEC 1.025 05/21/2015 0813   PHURINE 6.0 05/21/2015 0813   GLUCOSEU NEGATIVE 05/21/2015 0813   HGBUR NEGATIVE 05/21/2015 0813   BILIRUBINUR NEGATIVE 05/21/2015 0813   KETONESUR NEGATIVE 05/21/2015 0813   PROTEINUR NEGATIVE 05/21/2015 0813   UROBILINOGEN 0.2 08/27/2014 1348   NITRITE NEGATIVE 05/21/2015 0813   LEUKOCYTESUR NEGATIVE 05/21/2015 0813    Recent Results (from the past 240 hour(s))  Urine culture     Status: Abnormal   Collection Time: 05/21/15  8:13 AM  Result Value Ref Range Status   Specimen Description URINE, CLEAN CATCH  Final   Special Requests NONE  Final   Culture (A)  Final    8,000 COLONIES/mL INSIGNIFICANT GROWTH Performed at [facility]    Report Status 05/22/2015 FINAL  Final  Wet prep, genital     Status: Abnormal   Collection Time: 05/21/15  8:13 AM  Result Value Ref Range Status   Yeast Wet Prep HPF POC NONE SEEN NONE SEEN Final   Trich, Wet Prep NONE SEEN NONE SEEN Final   Clue Cells Wet Prep HPF POC PRESENT (A) NONE SEEN Final   WBC, Wet Prep HPF POC FEW (A) NONE SEEN Final   Sperm NONE SEEN  Final         Radiology Studies: No results found.      Scheduled Meds: . budesonide (PULMICORT) nebulizer solution  0.25 mg Nebulization BID  . ciprofloxacin  400 mg Intravenous Q12H  . diatrizoate meglumine-sodium  30 mL Oral Once  . enoxaparin (LOVENOX) injection  40 mg Subcutaneous Q24H  . losartan  50 mg Oral Daily  . metronidazole  500 mg Intravenous Q8H  .  montelukast  10 mg Oral Daily  . oxyCODONE-acetaminophen  1-2 tablet Oral Q4H  . pantoprazole  40 mg Oral Daily  . prochlorperazine  10 mg Intravenous Q6H  . sodium chloride flush  3 mL Intravenous Q12H   Continuous Infusions: . sodium chloride 75 mL/hr at 05/23/15 1532     LOS: 0 days    Time spent: [place], MD [facility] (P) [phone]   If 7PM-7AM, please contact night-coverage [email or web address] Password [facility] 05/23/2015, 3:55 PM

## 2015-05-24 ENCOUNTER — Inpatient Hospital Stay (HOSPITAL_COMMUNITY): Payer: Medicaid Other

## 2015-05-24 MED ORDER — LORAZEPAM 2 MG/ML IJ SOLN
1.0000 mg | Freq: Once | INTRAMUSCULAR | Status: AC
  Administered 2015-05-24: 2 mg via INTRAVENOUS
  Filled 2015-05-24: qty 1

## 2015-05-24 MED ORDER — FAMOTIDINE 40 MG/5ML PO SUSR: 20.0000 mg | Freq: Two times a day (BID) | ORAL | Status: DC

## 2015-05-24 MED ORDER — ZOLPIDEM TARTRATE 5 MG PO TABS
5.0000 mg | ORAL_TABLET | Freq: Once | ORAL | Status: AC
  Administered 2015-05-24: 5 mg via ORAL
  Filled 2015-05-24: qty 1

## 2015-05-24 MED ORDER — SUCRALFATE 1 G PO TABS
1.0000 g | ORAL_TABLET | Freq: Three times a day (TID) | ORAL | Status: DC
  Administered 2015-05-24 – 2015-05-25 (×2): 1 g via ORAL
  Filled 2015-05-24 (×2): qty 1

## 2015-05-24 MED ORDER — IOPAMIDOL (ISOVUE-300) INJECTION 61%
100.0000 mL | Freq: Once | INTRAVENOUS | Status: AC | PRN
  Administered 2015-05-24: 100 mL via INTRAVENOUS

## 2015-05-24 MED ORDER — RANITIDINE HCL 150 MG/10ML PO SYRP
150.0000 mg | ORAL_SOLUTION | Freq: Two times a day (BID) | ORAL | Status: DC
  Administered 2015-05-24: 150 mg via ORAL
  Filled 2015-05-24 (×3): qty 10

## 2015-05-24 NOTE — Progress Notes (Signed)
PROGRESS NOTE    Briana Wade  [id number] DOB: 06/04/1970 DOA: 05/21/2015 PCP: Junie Panning, NP  Outpatient Specialists:     Brief Narrative:  45 y/o ? IBS Prior Diverticulitis htn Asthma  Admit 4/26 acute abd pain, found to have acute sigmoid diverticulitis   Assessment & Plan:   Principal Problem:   Acute diverticulitis  `Continue empiric ciprofloxacin/Flagyl IV `attempting Full liquids 4.28 `Pain controlled - pain better-seems Ptyalism is the main issue now-no overt vomit--->Will ? Try to change to PO meds in am -Changed Zofran-->Compazine 05/23/15 -Saline 125-->75 cc/h Ct +contrast 4/28=Mild diverticulitis without concern for abcess    IBS (irritable bowel syndrome)/Chr LUQ pain,    Lactose intolerance  Prior adenomatous polyp   EGD linear erosions, 4 cm hiatal hernia+  Colonoscopy to the cecum was normal except for left-sided diverticulosis. Internal hemorrhoids were seen.1.28.15  -appears workup has been started by Dr. Hilarie Fredrickson with regards to either gluten insensitivity and IBS  -No underlying psychopathology that patient complains of at present time  -? OP Nissen re-eval?   high-risk sexual behavior  -Patient expressed an interest with regards to getting "checked out" for an STD despite no symptoms of discharge     GERD (gastroesophageal reflux disease) -protonix 40 qd -Add Carafat 4/28 -Add Pepcid 4/28    HTN (hypertension) -Cont Losartan 50 qd    Asthma -quiescent     DVT prophylaxis: Lovenox Code Status: Full Family Communication: none +.  Patient capable of updating them herself Disposition Plan: still unclear-when pain ctrll and taking po reliably   Consultants:   none  Procedures:   none  Antimicrobials:    cipor + flagyll 4/25    Subjective:  Nausea main Issue Pain moderately controlled Tells me she wants to go home but has not eaten or drunk yet and is still on IV antibiotics Ambulated some earlier  today  Objective: Filed Vitals:   05/24/15 1200 05/24/15 1300 05/24/15 1338 05/24/15 1424  BP: 178/84 178/92 153/95   Pulse:   110   Temp:   97.6 F (36.4 C)   TempSrc:   Oral   Resp:   18   Height:      Weight:      SpO2:   99% 97%   No intake or output data in the 24 hours ending 05/24/15 1917 Filed Weights   05/21/15 [id number] 05/21/15 1541  Weight: 98.884 kg (218 lb) 98.884 kg (218 lb)    Examination:  General exam: in some painful distress Respiratory system: Clear to auscultation. Respiratory effort normal. Cardiovascular system: S1 & S2 heard, RRR. No JVD, murmurs, rubs, gallops or clicks. No pedal edema. Gastrointestinal system: Abdomen is slightly distended with some discomfort primarily in RLQ which seems improved prior exam comparison Psychiatry: Judgement and insight appear normal. Mood & affect appropriate.     Data Reviewed: I have personally reviewed following labs and imaging studies  CBC:  Recent Labs Lab 05/21/15 0647 05/22/15 0346 05/23/15 0836  WBC 6.4 6.9 5.9  NEUTROABS  --   --  3.9  HGB 12.0 11.6* 11.0*  HCT 36.6 35.5* 34.0*  MCV 83.9 86.4 85.9  PLT 291 319 [id number]   Basic Metabolic Panel:  Recent Labs Lab 05/21/15 0647 05/22/15 0346  NA 139 139  K 3.7 4.3  CL 106 105  CO2 25 28  GLUCOSE 132* 125*  BUN 11 9  CREATININE 0.84 0.78  CALCIUM 9.1 8.5*   GFR: Estimated Creatinine Clearance: 95.6 mL/min (by  C-G formula based on Cr of 0.78). Liver Function Tests:  Recent Labs Lab 05/21/15 0647  AST 19  ALT 14  ALKPHOS 72  BILITOT 0.5  PROT 7.5  ALBUMIN 3.8    Recent Labs Lab 05/21/15 0647  LIPASE 22   No results for input(s): AMMONIA in the last 168 hours. Coagulation Profile: No results for input(s): INR, PROTIME in the last 168 hours. Cardiac Enzymes: No results for input(s): CKTOTAL, CKMB, CKMBINDEX, TROPONINI in the last 168 hours. BNP (last 3 results) No results for input(s): PROBNP in the last 8760 hours. HbA1C: No  results for input(s): HGBA1C in the last 72 hours. CBG: No results for input(s): GLUCAP in the last 168 hours. Lipid Profile: No results for input(s): CHOL, HDL, LDLCALC, TRIG, CHOLHDL, LDLDIRECT in the last 72 hours. Thyroid Function Tests: No results for input(s): TSH, T4TOTAL, FREET4, T3FREE, THYROIDAB in the last 72 hours. Anemia Panel: No results for input(s): VITAMINB12, FOLATE, FERRITIN, TIBC, IRON, RETICCTPCT in the last 72 hours. Urine analysis:    Component Value Date/Time   COLORURINE YELLOW 05/21/2015 0813   APPEARANCEUR CLEAR 05/21/2015 0813   LABSPEC 1.025 05/21/2015 0813   PHURINE 6.0 05/21/2015 0813   GLUCOSEU NEGATIVE 05/21/2015 0813   HGBUR NEGATIVE 05/21/2015 0813   BILIRUBINUR NEGATIVE 05/21/2015 0813   KETONESUR NEGATIVE 05/21/2015 0813   PROTEINUR NEGATIVE 05/21/2015 0813   UROBILINOGEN 0.2 08/27/2014 1348   NITRITE NEGATIVE 05/21/2015 0813   LEUKOCYTESUR NEGATIVE 05/21/2015 0813    Recent Results (from the past 240 hour(s))  Urine culture     Status: Abnormal   Collection Time: 05/21/15  8:13 AM  Result Value Ref Range Status   Specimen Description URINE, CLEAN CATCH  Final   Special Requests NONE  Final   Culture (A)  Final    8,000 COLONIES/mL INSIGNIFICANT GROWTH Performed at [facility]    Report Status 05/22/2015 FINAL  Final  Wet prep, genital     Status: Abnormal   Collection Time: 05/21/15  8:13 AM  Result Value Ref Range Status   Yeast Wet Prep HPF POC NONE SEEN NONE SEEN Final   Trich, Wet Prep NONE SEEN NONE SEEN Final   Clue Cells Wet Prep HPF POC PRESENT (A) NONE SEEN Final   WBC, Wet Prep HPF POC FEW (A) NONE SEEN Final   Sperm NONE SEEN  Final         Radiology Studies: Ct Abdomen Pelvis W Contrast  05/24/2015  CLINICAL DATA:  Irritable bowel syndrome. Hypertension. Gastroesophageal reflux. Abdominal pain started yesterday evening. EXAM: CT ABDOMEN AND PELVIS WITH CONTRAST TECHNIQUE: Multidetector CT imaging of the  abdomen and pelvis was performed using the standard protocol following bolus administration of intravenous contrast. CONTRAST:  176mL ISOVUE-300 IOPAMIDOL (ISOVUE-300) INJECTION 61% COMPARISON:  05/21/2015 FINDINGS: The patient vomited during the scan, causing motion artifact. This reduces exam sensitivity and specificity. Parts of the liver, gallbladder, pancreas, and spleen are highly obscured by the motion artifact. Lower chest:  Moderate-sized type 1 hiatal hernia. Hepatobiliary: Unremarkable were visualized. Pancreas: Unremarkable were visualized. Spleen: Unremarkable where visualized. Adrenals/Urinary Tract: Unremarkable where visualized. There is contrast medium in the collecting systems ; we saw on the prior CT from 3 days ago that there were no urinary tract stones. Stomach/Bowel: Mild active diverticulitis in the left lower quadrant along the proximal sigmoid colon, with associated wall thickening and stranding as shown on images 8 through 13 series 4. No abscess or extraluminal gas seen. Vascular/Lymphatic: Unremarkable Reproductive: 2.3  cm hypodense lesion associated with the right ovary, probably a cyst. Trace free pelvic fluid in the pouch of [place]. Suspected posterior uterine fibroid. Other: No supplemental non-categorized findings. Musculoskeletal: Chronic anterior wedging at L1. Umbilical hernia contains adipose tissue. IMPRESSION: 1. Mild acute proximal sigmoid colon diverticulitis. No abscess or extraluminal gas identified. 2. The patient vomited during the scan. We were only able to obtain delayed images and many of these are adversely affected by motion artifact. This reduces diagnostic sensitivity and specificity. In particular, nonobstructive renal calculi will not be seen on today' s exam due to the presence of contrast in the collecting systems, but no calculi were present on 05/21/2015. 3. 2.3 cm hypodense lesion of the right ovary, likely a cyst. 4. Chronic anterior wedging at the L1  vertebral body. 5. Umbilical hernia contains adipose tissue. 6. Moderate-sized type 1 hiatal hernia. 7. Suspected posterior uterine fibroid. Electronically Signed   By: Van Clines M.D.   On: 05/24/2015 16:36        Scheduled Meds: . budesonide (PULMICORT) nebulizer solution  0.25 mg Nebulization BID  . ciprofloxacin  400 mg Intravenous Q12H  . diatrizoate meglumine-sodium  30 mL Oral Once  . enoxaparin (LOVENOX) injection  40 mg Subcutaneous Q24H  . losartan  50 mg Oral Daily  . metronidazole  500 mg Intravenous Q8H  . montelukast  10 mg Oral Daily  . oxyCODONE-acetaminophen  1-2 tablet Oral Q4H  . pantoprazole  40 mg Oral Daily  . sodium chloride flush  3 mL Intravenous Q12H   Continuous Infusions: . sodium chloride 75 mL/hr at 05/23/15 1532     LOS: 1 day    Time spent: [place], MD [facility] ([facility]   If 7PM-7AM, please contact night-coverage [email or web address] Password [facility] 05/24/2015, 7:17 PM

## 2015-05-25 LAB — CBC
HCT: 36 % (ref 36.0–46.0)
Hemoglobin: 12 g/dL (ref 12.0–15.0)
MCH: 28.2 pg (ref 26.0–34.0)
MCHC: 33.3 g/dL (ref 30.0–36.0)
MCV: 84.7 fL (ref 78.0–100.0)
PLATELETS: 308 10*3/uL (ref 150–400)
RBC: 4.25 MIL/uL (ref 3.87–5.11)
RDW: 14.3 % (ref 11.5–15.5)
WBC: 7.7 10*3/uL (ref 4.0–10.5)

## 2015-05-25 LAB — COMPREHENSIVE METABOLIC PANEL
ALT: 16 U/L (ref 14–54)
AST: 26 U/L (ref 15–41)
Albumin: 3.7 g/dL (ref 3.5–5.0)
Alkaline Phosphatase: 70 U/L (ref 38–126)
Anion gap: 10 (ref 5–15)
BUN: 8 mg/dL (ref 6–20)
CHLORIDE: 103 mmol/L (ref 101–111)
CO2: 24 mmol/L (ref 22–32)
CREATININE: 0.92 mg/dL (ref 0.44–1.00)
Calcium: 8.8 mg/dL — ABNORMAL LOW (ref 8.9–10.3)
Glucose, Bld: 100 mg/dL — ABNORMAL HIGH (ref 65–99)
Potassium: 3.1 mmol/L — ABNORMAL LOW (ref 3.5–5.1)
Sodium: 137 mmol/L (ref 135–145)
TOTAL PROTEIN: 7.4 g/dL (ref 6.5–8.1)
Total Bilirubin: 0.5 mg/dL (ref 0.3–1.2)

## 2015-05-25 MED ORDER — SUCRALFATE 1 GM/10ML PO SUSP: 1.0000 g | Freq: Three times a day (TID) | ORAL | Status: DC

## 2015-05-25 MED ORDER — POTASSIUM CHLORIDE CRYS ER 20 MEQ PO TBCR
40.0000 meq | EXTENDED_RELEASE_TABLET | Freq: Every day | ORAL | Status: DC
  Filled 2015-05-25: qty 2

## 2015-05-25 MED ORDER — AMOXICILLIN-POT CLAVULANATE 875-125 MG PO TABS: 1.0000 | ORAL_TABLET | Freq: Two times a day (BID) | ORAL | Status: DC

## 2015-05-25 NOTE — Discharge Summary (Signed)
Physician Discharge Summary  Briana Wade [id number] DOB: Dec 28, 1970 DOA: 05/21/2015  PCP: Junie Panning, NP  Admit date: 05/21/2015 Discharge date: 05/25/2015  Time spent: 35 minutes  Recommendations for Outpatient Follow-up:  1. Needs to complete PO augmentin 05/30/15 2. Advised Carafate in addition to other meds 3. Follow up Dr. Hilarie Fredrickson to discuss IBs non emergently  Discharge Diagnoses:  Principal Problem:   Acute diverticulitis Active Problems:   IBS (irritable bowel syndrome)   GERD (gastroesophageal reflux disease)   Diverticulitis of sigmoid colon   HTN (hypertension)   Diverticula of colon   Asthma   Discharge Condition: god  Diet recommendation:  Soft, bland  Filed Weights   05/21/15 0642 05/21/15 1541  Weight: 98.884 kg (218 lb) 98.884 kg (218 lb)   Hospital Course:  45 y/o ? IBS (irritable bowel syndrome)/Chr LUQ pain,   Lactose intolerance Prior adenomatous polyp  EGD linear erosions, 4 cm hiatal hernia+ Colonoscopy to the cecum was normal except for left-sided diverticulosis. Internal hemorrhoids were seen.1.28.15 Prior Diverticulitis htn Asthma  Admit 4/26 acute abd pain, found to have acute sigmoid diverticulitis  History of present illness:  Acute diverticulitis `Pain controlled completely by 05/24/15 - eems Ptyalism is the main issue now-no overt vomit--->]= -Saline 125-->75 cc/h-->toelrating soft diet on 4/29 and insistent on d/c home -Warning precautions for retrun, PO augmentin given Ct +contrast repeated 2/2 to vomit 4/28=Mild diverticulitis without concern for abcess   IBS (irritable bowel syndrome)/Chr LUQ pain,   Lactose intolerance Prior adenomatous polyp  Hiatal hernia  EGD linear erosions, 4 cm hiatal hernia+ Colonoscopy to the cecum was normal except for left-sided diverticulosis. Internal hemorrhoids were seen.1.28.15 -appears workup has been started by Dr. Hilarie Fredrickson with regards to either gluten  insensitivity and IBS -No underlying psychopathology that patient complains of at present time  -? OP Nissen re-eval?  high-risk sexual behavior -Patient expressed an interest with regards to getting "checked out" for an STD despite no symptoms of discharge  -patient encouraged to be open with husband regarding her affair  -she can obtain copy STD reports from [facility] records-I did show her her neg tests   GERD (gastroesophageal reflux disease) -protonix 40 qd -Added Carafat 4/28-script given on d/c3obese pleasant, colour seems better,  sit  HTN (hypertension) -Cont Losartan 50 qd   Asthma -quiescent   Procedures:  CT   Consultations:   none  Discharge Exam: Filed Vitals:   05/24/15 2036 05/25/15 0557  BP: 157/96 159/97  Pulse: 115 104  Temp: 99.5 F (37.5 C) 98.3 F (36.8 C)  Resp: 18 18    General: alert pleasant, colour seems better, facies benign Cardiovascular: s1 s2 no m/r/g Respiratory:  Clear no added sound Abd soft nt nd no rebound  Discharge Instructions   Discharge Instructions    Diet - low sodium heart healthy    Complete by:  As directed      Discharge instructions    Complete by:  As directed   Complete Augmentin 1 tab twice daily for another 6 days Please try to get the carafate and follow up with your GI doc See PCP in about 2 weeks or 3 weeks     Increase activity slowly    Complete by:  As directed           Current Discharge Medication List    START taking these medications   Details  amoxicillin-clavulanate (AUGMENTIN) 875-125 MG tablet Take 1 tablet by mouth 2 (two) times daily. Qty: 12  tablet, Refills: 0    sucralfate (CARAFATE) 1 GM/10ML suspension Take 10 mLs (1 g total) by mouth 4 (four) times daily -  with meals and at bedtime. Qty: 420 mL, Refills: 0      CONTINUE these medications which have NOT CHANGED   Details  albuterol (PROVENTIL HFA;VENTOLIN HFA) 108 (90 BASE) MCG/ACT  inhaler Inhale 1-2 puffs into the lungs every 6 (six) hours as needed for wheezing or shortness of breath. Reported on 04/05/2015    losartan (COZAAR) 50 MG tablet Take 50 mg by mouth daily. Refills: 1    montelukast (SINGULAIR) 10 MG tablet Take 10 mg by mouth daily.  Refills: 2    pantoprazole (PROTONIX) 40 MG tablet Take 1 tablet (40 mg total) by mouth daily. Qty: 30 tablet, Refills: 6    QVAR 40 MCG/ACT inhaler Inhale 3 puffs into the lungs 2 (two) times daily. Refills: 3       No Known Allergies    The results of significant diagnostics from this hospitalization (including imaging, microbiology, ancillary and laboratory) are listed below for reference.    Significant Diagnostic Studies: Ct Abdomen Pelvis Wo Contrast  05/21/2015  CLINICAL DATA:  Lower abdominal pain radiating to the left side since last night. History of diverticulitis. EXAM: CT ABDOMEN AND PELVIS WITHOUT CONTRAST TECHNIQUE: Multidetector CT imaging of the abdomen and pelvis was performed following the standard protocol without IV contrast. COMPARISON:  05/06/2012 FINDINGS: Minimal atelectasis is noted in the right lung base. The liver, spleen, gallbladder, adrenal glands, kidneys, and pancreas have an unremarkable unenhanced appearance. A moderate-size sliding hiatal hernia is noted. Oral contrast is present in the stomach, duodenum, and colon. There is no evidence of bowel obstruction. Left-sided colonic diverticulosis is again seen. There is new wall thickening and mild surrounding inflammatory stranding involving the proximal sigmoid colon. No extraluminal gas or fluid collection is identified. Appendix is absent. Trace free fluid is noted in the pelvis. The uterus and ovaries are grossly unremarkable. Bladder is unremarkable. No enlarged lymph nodes are identified. A small fat containing umbilical hernia is unchanged. No acute osseous abnormality is identified. IMPRESSION: Acute sigmoid diverticulitis. No evidence of  perforation or abscess. Electronically Signed   By: Logan Bores M.D.   On: 05/21/2015 11:55   Ct Abdomen Pelvis W Contrast  05/24/2015  CLINICAL DATA:  Irritable bowel syndrome. Hypertension. Gastroesophageal reflux. Abdominal pain started yesterday evening. EXAM: CT ABDOMEN AND PELVIS WITH CONTRAST TECHNIQUE: Multidetector CT imaging of the abdomen and pelvis was performed using the standard protocol following bolus administration of intravenous contrast. CONTRAST:  118mL ISOVUE-300 IOPAMIDOL (ISOVUE-300) INJECTION 61% COMPARISON:  05/21/2015 FINDINGS: The patient vomited during the scan, causing motion artifact. This reduces exam sensitivity and specificity. Parts of the liver, gallbladder, pancreas, and spleen are highly obscured by the motion artifact. Lower chest:  Moderate-sized type 1 hiatal hernia. Hepatobiliary: Unremarkable were visualized. Pancreas: Unremarkable were visualized. Spleen: Unremarkable where visualized. Adrenals/Urinary Tract: Unremarkable where visualized. There is contrast medium in the collecting systems ; we saw on the prior CT from 3 days ago that there were no urinary tract stones. Stomach/Bowel: Mild active diverticulitis in the left lower quadrant along the proximal sigmoid colon, with associated wall thickening and stranding as shown on images 8 through 13 series 4. No abscess or extraluminal gas seen. Vascular/Lymphatic: Unremarkable Reproductive: 2.3 cm hypodense lesion associated with the right ovary, probably a cyst. Trace free pelvic fluid in the pouch of Douglas. Suspected posterior uterine fibroid. Other: No supplemental  non-categorized findings. Musculoskeletal: Chronic anterior wedging at L1. Umbilical hernia contains adipose tissue. IMPRESSION: 1. Mild acute proximal sigmoid colon diverticulitis. No abscess or extraluminal gas identified. 2. The patient vomited during the scan. We were only able to obtain delayed images and many of these are adversely affected by  motion artifact. This reduces diagnostic sensitivity and specificity. In particular, nonobstructive renal calculi will not be seen on today' s exam due to the presence of contrast in the collecting systems, but no calculi were present on 05/21/2015. 3. 2.3 cm hypodense lesion of the right ovary, likely a cyst. 4. Chronic anterior wedging at the L1 vertebral body. 5. Umbilical hernia contains adipose tissue. 6. Moderate-sized type 1 hiatal hernia. 7. Suspected posterior uterine fibroid. Electronically Signed   By: Van Clines M.D.   On: 05/24/2015 16:36   Mm Digital Screening Bilateral  05/09/2015  CLINICAL DATA:  Screening. EXAM: DIGITAL SCREENING BILATERAL MAMMOGRAM WITH CAD COMPARISON:  Previous exam(s). ACR Breast Density Category c: The breast tissue is heterogeneously dense, which may obscure small masses. FINDINGS: There are no findings suspicious for malignancy. Images were processed with CAD. IMPRESSION: No mammographic evidence of malignancy. A result letter of this screening mammogram will be mailed directly to the patient. RECOMMENDATION: Screening mammogram in one year. (Code:SM-B-01Y) BI-RADS CATEGORY  1: Negative. Electronically Signed   By: Ammie Ferrier M.D.   On: 05/09/2015 16:55    Microbiology: Recent Results (from the past 240 hour(s))  Urine culture     Status: Abnormal   Collection Time: 05/21/15  8:13 AM  Result Value Ref Range Status   Specimen Description URINE, CLEAN CATCH  Final   Special Requests NONE  Final   Culture (A)  Final    8,000 COLONIES/mL INSIGNIFICANT GROWTH Performed at [facility]    Report Status 05/22/2015 FINAL  Final  Wet prep, genital     Status: Abnormal   Collection Time: 05/21/15  8:13 AM  Result Value Ref Range Status   Yeast Wet Prep HPF POC NONE SEEN NONE SEEN Final   Trich, Wet Prep NONE SEEN NONE SEEN Final   Clue Cells Wet Prep HPF POC PRESENT (A) NONE SEEN Final   WBC, Wet Prep HPF POC FEW (A) NONE SEEN Final   Sperm  NONE SEEN  Final     Labs: Basic Metabolic Panel:  Recent Labs Lab 05/21/15 0647 05/22/15 0346 05/25/15 0332  NA 139 139 137  K 3.7 4.3 3.1*  CL 106 105 103  CO2 25 28 24   GLUCOSE 132* 125* 100*  BUN 11 9 8   CREATININE 0.84 0.78 0.92  CALCIUM 9.1 8.5* 8.8*   Liver Function Tests:  Recent Labs Lab 05/21/15 0647 05/25/15 0332  AST 19 26  ALT 14 16  ALKPHOS 72 70  BILITOT 0.5 0.5  PROT 7.5 7.4  ALBUMIN 3.8 3.7    Recent Labs Lab 05/21/15 0647  LIPASE 22   No results for input(s): AMMONIA in the last 168 hours. CBC:  Recent Labs Lab 05/21/15 0647 05/22/15 0346 05/23/15 0836 05/25/15 0332  WBC 6.4 6.9 5.9 7.7  NEUTROABS  --   --  3.9  --   HGB 12.0 11.6* 11.0* 12.0  HCT 36.6 35.5* 34.0* 36.0  MCV 83.9 86.4 85.9 84.7  PLT 291 319 299 308   Cardiac Enzymes: No results for input(s): CKTOTAL, CKMB, CKMBINDEX, TROPONINI in the last 168 hours. BNP: BNP (last 3 results) No results for input(s): BNP in the last 8760  hours.  ProBNP (last 3 results) No results for input(s): PROBNP in the last 8760 hours.  CBG: No results for input(s): GLUCAP in the last 168 hours.     SignedNita Sells MD.  [facility] 05/25/2015, 12:23 PM

## 2015-05-25 NOTE — Progress Notes (Signed)
Pt refusing all her morning medications, MD notified . Will continue to monitor

## 2015-05-29 ENCOUNTER — Ambulatory Visit: Payer: Medicaid Other | Admitting: Internal Medicine

## 2015-06-26 ENCOUNTER — Ambulatory Visit: Payer: Medicaid Other | Admitting: Podiatry

## 2015-07-23 ENCOUNTER — Encounter: Payer: Self-pay | Admitting: Podiatry

## 2015-07-23 ENCOUNTER — Encounter (INDEPENDENT_AMBULATORY_CARE_PROVIDER_SITE_OTHER): Payer: Medicaid Other | Admitting: Podiatry

## 2015-07-23 NOTE — Progress Notes (Signed)
This encounter was created in error - please disregard.

## 2015-08-06 ENCOUNTER — Emergency Department (HOSPITAL_COMMUNITY)
Admission: EM | Admit: 2015-08-06 | Discharge: 2015-08-06 | Disposition: A | Discharge status: Home / Self Care | Payer: Medicaid Other | Attending: Emergency Medicine | Admitting: Emergency Medicine

## 2015-08-06 ENCOUNTER — Emergency Department (HOSPITAL_COMMUNITY): Payer: Medicaid Other

## 2015-08-06 ENCOUNTER — Encounter (HOSPITAL_COMMUNITY): Payer: Self-pay | Admitting: *Deleted

## 2015-08-06 DIAGNOSIS — Y9301 Activity, walking, marching and hiking: Secondary | ICD-10-CM | POA: Insufficient documentation

## 2015-08-06 DIAGNOSIS — S299XXA Unspecified injury of thorax, initial encounter: Secondary | ICD-10-CM | POA: Diagnosis present

## 2015-08-06 DIAGNOSIS — S5002XA Contusion of left elbow, initial encounter: Secondary | ICD-10-CM | POA: Insufficient documentation

## 2015-08-06 DIAGNOSIS — Y999 Unspecified external cause status: Secondary | ICD-10-CM | POA: Insufficient documentation

## 2015-08-06 DIAGNOSIS — S29011A Strain of muscle and tendon of front wall of thorax, initial encounter: Secondary | ICD-10-CM

## 2015-08-06 DIAGNOSIS — W19XXXA Unspecified fall, initial encounter: Secondary | ICD-10-CM

## 2015-08-06 DIAGNOSIS — J45909 Unspecified asthma, uncomplicated: Secondary | ICD-10-CM | POA: Diagnosis not present

## 2015-08-06 DIAGNOSIS — Y92009 Unspecified place in unspecified non-institutional (private) residence as the place of occurrence of the external cause: Secondary | ICD-10-CM | POA: Diagnosis not present

## 2015-08-06 DIAGNOSIS — I10 Essential (primary) hypertension: Secondary | ICD-10-CM | POA: Diagnosis not present

## 2015-08-06 DIAGNOSIS — M545 Low back pain: Secondary | ICD-10-CM | POA: Insufficient documentation

## 2015-08-06 DIAGNOSIS — Z79899 Other long term (current) drug therapy: Secondary | ICD-10-CM | POA: Diagnosis not present

## 2015-08-06 DIAGNOSIS — W108XXA Fall (on) (from) other stairs and steps, initial encounter: Secondary | ICD-10-CM | POA: Insufficient documentation

## 2015-08-06 MED ORDER — IBUPROFEN 800 MG PO TABS
800.0000 mg | ORAL_TABLET | Freq: Once | ORAL | Status: AC
  Administered 2015-08-06: 800 mg via ORAL
  Filled 2015-08-06: qty 1

## 2015-08-06 MED ORDER — CYCLOBENZAPRINE HCL 5 MG PO TABS: 5.0000 mg | ORAL_TABLET | Freq: Two times a day (BID) | ORAL | Status: DC | PRN

## 2015-08-06 MED ORDER — IBUPROFEN 800 MG PO TABS: 800.0000 mg | ORAL_TABLET | Freq: Three times a day (TID) | ORAL | Status: DC

## 2015-08-06 NOTE — ED Notes (Signed)
Pt given an ice pack  °

## 2015-08-06 NOTE — Discharge Instructions (Signed)
Elbow Contusion An elbow contusion is a deep bruise of the elbow. Contusions are the result of an injury that caused bleeding under the skin. The contusion may turn blue, purple, or yellow. Minor injuries will give you a painless contusion, but more severe contusions may stay painful and swollen for a few weeks.  CAUSES  An elbow contusion comes from a direct force to that area, such as falling on the elbow. SYMPTOMS   Swelling and redness of the elbow.  Bruising of the elbow area.  Tenderness or soreness of the elbow. DIAGNOSIS  You will have a physical exam and will be asked about your history. You may need an X-ray of your elbow to look for a broken bone (fracture).  TREATMENT  A sling or splint may be needed to support your injury. Resting, elevating, and applying cold compresses to the elbow area are often the best treatments for an elbow contusion. Over-the-counter medicines may also be recommended for pain control. HOME CARE INSTRUCTIONS   Put ice on the injured area.  Put ice in a plastic bag.  Place a towel between your skin and the bag.  Leave the ice on for 15-20 minutes, 03-04 times a day.  Only take over-the-counter or prescription medicines for pain, discomfort, or fever as directed by your caregiver.  Rest your injured elbow until the pain and swelling are better.  Elevate your elbow to reduce swelling.  Apply a compression wrap as directed by your caregiver. This can help reduce swelling and motion. You may remove the wrap for sleeping, showers, and baths. If your fingers become numb, cold, or blue, take the wrap off and reapply it more loosely.  Use your elbow only as directed by your caregiver. You may be asked to do range of motion exercises. Do them as directed.  See your caregiver as directed. It is very important to keep all follow-up appointments in order to avoid any long-term problems with your elbow, including chronic pain or inability to move your elbow  normally. SEEK IMMEDIATE MEDICAL CARE IF:   You have increased redness, swelling, or pain in your elbow.  Your swelling or pain is not relieved with medicines.  You have swelling of the hand and fingers.  You are unable to move your fingers or wrist.  You begin to lose feeling in your hand or fingers.  Your fingers or hand become cold or blue. MAKE SURE YOU:   Understand these instructions.  Will watch your condition.  Will get help right away if you are not doing well or get worse.   This information is not intended to replace advice given to you by your health care provider. Make sure you discuss any questions you have with your health care provider.   Document Released: 12/21/2005 Document Revised: 04/06/2011 Document Reviewed: 08/27/2014 Elsevier Interactive Patient Education 2016 [place].  Muscle Strain A muscle strain is an injury that occurs when a muscle is stretched beyond its normal length. Usually a small number of muscle fibers are torn when this happens. Muscle strain is rated in degrees. First-degree strains have the least amount of muscle fiber tearing and pain. Second-degree and third-degree strains have increasingly more tearing and pain.  Usually, recovery from muscle strain takes 1-2 weeks. Complete healing takes 5-6 weeks.  CAUSES  Muscle strain happens when a sudden, violent force placed on a muscle stretches it too far. This may occur with lifting, sports, or a fall.  RISK FACTORS Muscle strain is especially  common in athletes.  SIGNS AND SYMPTOMS At the site of the muscle strain, there may be:  Pain.  Bruising.  Swelling.  Difficulty using the muscle due to pain or lack of normal function. DIAGNOSIS  Your health care provider will perform a physical exam and ask about your medical history. TREATMENT  Often, the best treatment for a muscle strain is resting, icing, and applying cold compresses to the injured area.  HOME CARE INSTRUCTIONS     Use the PRICE method of treatment to promote muscle healing during the first 2-3 days after your injury. The PRICE method involves:  Protecting the muscle from being injured again.  Restricting your activity and resting the injured body part.  Icing your injury. To do this, put ice in a plastic bag. Place a towel between your skin and the bag. Then, apply the ice and leave it on from 15-20 minutes each hour. After the third day, switch to moist heat packs.  Apply compression to the injured area with a splint or elastic bandage. Be careful not to wrap it too tightly. This may interfere with blood circulation or increase swelling.  Elevate the injured body part above the level of your heart as often as you can.  Only take over-the-counter or prescription medicines for pain, discomfort, or fever as directed by your health care provider.  Warming up prior to exercise helps to prevent future muscle strains. SEEK MEDICAL CARE IF:   You have increasing pain or swelling in the injured area.  You have numbness, tingling, or a significant loss of strength in the injured area. MAKE SURE YOU:   Understand these instructions.  Will watch your condition.  Will get help right away if you are not doing well or get worse.   This information is not intended to replace advice given to you by your health care provider. Make sure you discuss any questions you have with your health care provider.   Document Released: 01/12/2005 Document Revised: 11/02/2012 Document Reviewed: 08/11/2012 Elsevier Interactive Patient Education [employer].

## 2015-08-06 NOTE — ED Notes (Signed)
Patient states she missed a step on some cement stairs outside her home this morning and fell.  Patient c/o left arm, low back/flank, and shin pain.  Patient denies LOC or hitting head.

## 2015-08-06 NOTE — ED Provider Notes (Signed)
CSN: [id number]     Arrival date & time 08/06/15  [id number] History   First MD Initiated Contact with Patient 08/06/15 [phone]     Chief Complaint  Patient presents with  . Arm Pain  . Back Pain     Patient is a 45 y.o. female presenting with arm pain and back pain. The history is provided by the patient. No language interpreter was used.  Arm Pain  Back Pain  Briana Wade is a 45 y.o. female who presents to the Emergency Department complaining of fall.  She was walking down steps about 2 in the morning when she slipped and missed a step. She fell down a few steps. She reports pain to her left elbow and her left side. She is not sure what she hit her up she twisted. No head injury or loss of consciousness. She denies any difficulty breathing, hematuria. Symptoms are moderate and constant in nature. She is right-handed.  Past Medical History  Diagnosis Date  . Other constipation   . Abdominal pain, unspecified site   . Hypertension   . Migraine   . IBS (irritable bowel syndrome)   . Umbilical hernia   . Uterine fibroid   . Chlamydia   . Esophageal reflux   . Personal history of colonic polyps 09/12/2010    tubular adenoma  . Lactose intolerance   . Hiatal hernia   . Diverticulitis   . Allergy   . Anemia   . Asthma   . Diverticulosis   . Tubular adenoma of colon    Past Surgical History  Procedure Laterality Date  . Appendectomy    . Tubal ligation    . Appendectomy     Family History  Problem Relation Age of Onset  . Colon cancer Neg Hx   . Esophageal cancer Neg Hx   . Rectal cancer Neg Hx   . Stomach cancer Neg Hx   . Hypertension Mother   . Cancer Mother     not sure of type   Social History  Substance Use Topics  . Smoking status: Never Smoker   . Smokeless tobacco: Never Used  . Alcohol Use: Yes     Comment: occasionally   OB History    Gravida Para Term Preterm AB TAB SAB Ectopic Multiple Living   7 3 3  4 3 1   3      Review of Systems   Musculoskeletal: Positive for back pain.  All other systems reviewed and are negative.     Allergies  Review of patient's allergies indicates no known allergies.  Home Medications   Prior to Admission medications   Medication Sig Start Date End Date Taking? Authorizing Provider  albuterol (PROVENTIL HFA;VENTOLIN HFA) 108 (90 BASE) MCG/ACT inhaler Inhale 1-2 puffs into the lungs every 6 (six) hours as needed for wheezing or shortness of breath. Reported on 04/05/2015   Yes Historical Provider, MD  losartan (COZAAR) 50 MG tablet Take 50 mg by mouth daily. 04/16/15  Yes Historical Provider, MD  montelukast (SINGULAIR) 10 MG tablet Take 10 mg by mouth daily as needed (allergies).  04/09/15  Yes Historical Provider, MD  pantoprazole (PROTONIX) 40 MG tablet Take 1 tablet (40 mg total) by mouth daily. Patient taking differently: Take 40 mg by mouth daily as needed (heartburn).  04/05/15  Yes Jerene Bears, MD  QVAR 40 MCG/ACT inhaler Inhale 2 puffs into the lungs 2 (two) times daily as needed (shortness of breath).  04/09/15  Yes  Historical Provider, MD  sucralfate (CARAFATE) 1 GM/10ML suspension Take 10 mLs (1 g total) by mouth 4 (four) times daily -  with meals and at bedtime. Patient taking differently: Take 1 g by mouth 4 (four) times daily as needed (stomach pain).  05/25/15  Yes Nita Sells, MD  amoxicillin-clavulanate (AUGMENTIN) 875-125 MG tablet Take 1 tablet by mouth 2 (two) times daily. Patient not taking: Reported on 08/06/2015 05/25/15   Nita Sells, MD  cyclobenzaprine (FLEXERIL) 5 MG tablet Take 1 tablet (5 mg total) by mouth 2 (two) times daily as needed for muscle spasms. 08/06/15   Quintella Reichert, MD  ibuprofen (ADVIL,MOTRIN) 800 MG tablet Take 1 tablet (800 mg total) by mouth 3 (three) times daily. 08/06/15   Quintella Reichert, MD   BP 135/104 mmHg  Pulse 75  Temp(Src) 98.8 F (37.1 C) (Oral)  Resp 18  Ht 5\' 5"  (1.651 m)  Wt 215 lb (97.523 kg)  BMI 35.78 kg/m2  SpO2  99%  LMP 08/01/2015 Physical Exam  Constitutional: She is oriented to person, place, and time. She appears well-developed and well-nourished.  HENT:  Head: Normocephalic and atraumatic.  Cardiovascular: Normal rate and regular rhythm.   No murmur heard. Pulmonary/Chest: Effort normal and breath sounds normal. No respiratory distress.  Tenderness to palpation over the left axillary and left posterior chest wall.  Abdominal: Soft. There is no tenderness. There is no rebound and no guarding.  Musculoskeletal:  No C, T, L-spine tenderness. There is moderate swelling and tenderness over the left posterior elbow. No shoulder, forearm, wrist, hand tenderness to palpation. 2+ radial pulses bilaterally. Supination, pronation intact.  Neurological: She is alert and oriented to person, place, and time.  Skin: Skin is warm and dry.  Psychiatric: She has a normal mood and affect. Her behavior is normal.  Nursing note and vitals reviewed.   ED Course  Procedures  Labs Review Labs Reviewed - No data to display  Imaging Review Dg Ribs Unilateral W/chest Left  08/06/2015  CLINICAL DATA:  Fall. EXAM: LEFT RIBS AND CHEST - 3+ VIEW COMPARISON:  03/05/2012 FINDINGS: No fracture or other bone lesions are seen involving the ribs. There is no evidence of pneumothorax or pleural effusion. Both lungs are clear. Heart size and mediastinal contours are within normal limits. IMPRESSION: Negative. Electronically Signed   By: Kerby Moors M.D.   On: 08/06/2015 09:45   Dg Elbow Complete Left  08/06/2015  CLINICAL DATA:  Fall. EXAM: LEFT ELBOW - COMPLETE 3+ VIEW COMPARISON:  None. FINDINGS: There is no evidence of fracture, dislocation, or joint effusion. There is no evidence of arthropathy or other focal bone abnormality. Soft tissues are unremarkable. IMPRESSION: Negative. Electronically Signed   By: Kerby Moors M.D.   On: 08/06/2015 09:42   I have personally reviewed and evaluated these images and lab results  as part of my medical decision-making.   EKG Interpretation None      MDM   Final diagnoses:  Elbow contusion, left, initial encounter  Chest wall muscle strain, initial encounter  Fall, initial encounter   Pt here for evaluation of injuries following a mechanical fall.  Elbow with swelling, no evidence of fracture or dislocation.  No evidence of rib fracture.  D/w pt home care for contusion and muscle strain. Discussed outpatient follow up and return precautions.     Quintella Reichert, MD 08/06/15 [phone]

## 2015-08-10 ENCOUNTER — Encounter (HOSPITAL_COMMUNITY): Payer: Self-pay | Admitting: Emergency Medicine

## 2015-08-10 ENCOUNTER — Emergency Department (HOSPITAL_COMMUNITY)
Admission: EM | Admit: 2015-08-10 | Discharge: 2015-08-10 | Disposition: A | Discharge status: Home / Self Care | Payer: Medicaid Other | Attending: Emergency Medicine | Admitting: Emergency Medicine

## 2015-08-10 DIAGNOSIS — W109XXA Fall (on) (from) unspecified stairs and steps, initial encounter: Secondary | ICD-10-CM | POA: Insufficient documentation

## 2015-08-10 DIAGNOSIS — I10 Essential (primary) hypertension: Secondary | ICD-10-CM | POA: Insufficient documentation

## 2015-08-10 DIAGNOSIS — Z79899 Other long term (current) drug therapy: Secondary | ICD-10-CM | POA: Diagnosis not present

## 2015-08-10 DIAGNOSIS — R0781 Pleurodynia: Secondary | ICD-10-CM | POA: Diagnosis not present

## 2015-08-10 DIAGNOSIS — J45909 Unspecified asthma, uncomplicated: Secondary | ICD-10-CM | POA: Insufficient documentation

## 2015-08-10 DIAGNOSIS — Y929 Unspecified place or not applicable: Secondary | ICD-10-CM | POA: Diagnosis not present

## 2015-08-10 DIAGNOSIS — M25522 Pain in left elbow: Secondary | ICD-10-CM | POA: Insufficient documentation

## 2015-08-10 DIAGNOSIS — Y999 Unspecified external cause status: Secondary | ICD-10-CM | POA: Insufficient documentation

## 2015-08-10 DIAGNOSIS — Z86010 Personal history of colonic polyps: Secondary | ICD-10-CM | POA: Diagnosis not present

## 2015-08-10 DIAGNOSIS — Z791 Long term (current) use of non-steroidal anti-inflammatories (NSAID): Secondary | ICD-10-CM | POA: Insufficient documentation

## 2015-08-10 DIAGNOSIS — Z792 Long term (current) use of antibiotics: Secondary | ICD-10-CM | POA: Insufficient documentation

## 2015-08-10 DIAGNOSIS — Y9389 Activity, other specified: Secondary | ICD-10-CM | POA: Insufficient documentation

## 2015-08-10 MED ORDER — TRAMADOL HCL 50 MG PO TABS: 50.0000 mg | ORAL_TABLET | Freq: Four times a day (QID) | ORAL | Status: DC | PRN

## 2015-08-10 NOTE — ED Provider Notes (Signed)
CSN: [id number]     Arrival date & time 08/10/15  1422 History  By signing my name below, I, Dora Sims, attest that this documentation has been prepared under the direction and in the presence of non-physician practitioner, Shary Decamp, PA-C. Electronically Signed: Dora Sims, Scribe. 08/10/2015. 2:56 PM.   Chief Complaint  Patient presents with  . rib cage pain   . Fall   The history is provided by the patient. No language interpreter was used.    HPI Comments: Briana Wade is a 45 y.o. female who presents to the Emergency Department complaining of sudden onset, constant, left rib and left elbow pain ongoing for the past 4 days. She states she fell 4 days ago while ambulating down some steps and landed on her left side; she denies hitting her head or losing consciousness. She endorses associated bruising over her left ribs and left elbow. Pt was seen here after the fall and was prescribed ibuprofen and muscle relaxants which have not provided relief. She also had x-rays of her ribs and left elbow taken which came back negative. Pt endorses severe pain exacerbation with palpation to her left ribs. She also notes left rib pain exacerbation with inhalation and general movement. She states she has been taking her medications as instructed. She states that she lifts heavy objects and is on her feet often at work. Pt denies numbness, weakness, or any other associated symptoms.  PCP: Augusto Gamble, NP  Past Medical History  Diagnosis Date  . Other constipation   . Abdominal pain, unspecified site   . Hypertension   . Migraine   . IBS (irritable bowel syndrome)   . Umbilical hernia   . Uterine fibroid   . Chlamydia   . Esophageal reflux   . Personal history of colonic polyps 09/12/2010    tubular adenoma  . Lactose intolerance   . Hiatal hernia   . Diverticulitis   . Allergy   . Anemia   . Asthma   . Diverticulosis   . Tubular adenoma of colon    Past Surgical History   Procedure Laterality Date  . Appendectomy    . Tubal ligation    . Appendectomy     Family History  Problem Relation Age of Onset  . Colon cancer Neg Hx   . Esophageal cancer Neg Hx   . Rectal cancer Neg Hx   . Stomach cancer Neg Hx   . Hypertension Mother   . Cancer Mother     not sure of type   Social History  Substance Use Topics  . Smoking status: Never Smoker   . Smokeless tobacco: Never Used  . Alcohol Use: Yes     Comment: occasionally   OB History    Gravida Para Term Preterm AB TAB SAB Ectopic Multiple Living   7 3 3  4 3 1   3      Review of Systems  Cardiovascular: Positive for chest pain (left ribs).  Musculoskeletal: Positive for arthralgias (left elbow).  Neurological: Negative for syncope, weakness and numbness.  All other systems reviewed and are negative.   Allergies  Review of patient's allergies indicates no known allergies.  Home Medications   Prior to Admission medications   Medication Sig Start Date End Date Taking? Authorizing Provider  albuterol (PROVENTIL HFA;VENTOLIN HFA) 108 (90 BASE) MCG/ACT inhaler Inhale 1-2 puffs into the lungs every 6 (six) hours as needed for wheezing or shortness of breath. Reported on 04/05/2015  Historical Provider, MD  amoxicillin-clavulanate (AUGMENTIN) 875-125 MG tablet Take 1 tablet by mouth 2 (two) times daily. Patient not taking: Reported on 08/06/2015 05/25/15   Nita Sells, MD  cyclobenzaprine (FLEXERIL) 5 MG tablet Take 1 tablet (5 mg total) by mouth 2 (two) times daily as needed for muscle spasms. 08/06/15   Quintella Reichert, MD  ibuprofen (ADVIL,MOTRIN) 800 MG tablet Take 1 tablet (800 mg total) by mouth 3 (three) times daily. 08/06/15   Quintella Reichert, MD  losartan (COZAAR) 50 MG tablet Take 50 mg by mouth daily. 04/16/15   Historical Provider, MD  montelukast (SINGULAIR) 10 MG tablet Take 10 mg by mouth daily as needed (allergies).  04/09/15   Historical Provider, MD  pantoprazole (PROTONIX) 40 MG  tablet Take 1 tablet (40 mg total) by mouth daily. Patient taking differently: Take 40 mg by mouth daily as needed (heartburn).  04/05/15   Jerene Bears, MD  QVAR 40 MCG/ACT inhaler Inhale 2 puffs into the lungs 2 (two) times daily as needed (shortness of breath).  04/09/15   Historical Provider, MD  sucralfate (CARAFATE) 1 GM/10ML suspension Take 10 mLs (1 g total) by mouth 4 (four) times daily -  with meals and at bedtime. Patient taking differently: Take 1 g by mouth 4 (four) times daily as needed (stomach pain).  05/25/15   Nita Sells, MD   BP 140/89 mmHg  Pulse 76  Temp(Src) 98 F (36.7 C) (Oral)  Resp 16  SpO2 97%  LMP 08/01/2015   Physical Exam  Constitutional: She is oriented to person, place, and time. She appears well-developed and well-nourished. No distress.  HENT:  Head: Normocephalic and atraumatic.  Eyes: Conjunctivae and EOM are normal.  Neck: Neck supple. No tracheal deviation present.  Cardiovascular: Normal rate.   Pulmonary/Chest: Effort normal. No respiratory distress.  Lungs CTA bilaterally.  Musculoskeletal: Normal range of motion.  No palpable deformities. TTP of the left axilla and left ribs 5-7.  Neurological: She is alert and oriented to person, place, and time.  Skin: Skin is warm and dry.  Psychiatric: She has a normal mood and affect. Her behavior is normal.  Nursing note and vitals reviewed.  ED Course  Procedures (including critical care time)  DIAGNOSTIC STUDIES: Oxygen Saturation is 97% on RA, normal by my interpretation.    COORDINATION OF CARE: 2:56 PM Discussed treatment plan with pt at bedside and pt agreed to plan.  Labs Review Labs Reviewed - No data to display  Imaging Review No results found. I have personally reviewed and evaluated these images and lab results as part of my medical decision-making.   EKG Interpretation None      MDM  I have reviewed and evaluated the relevant imaging studies. I have reviewed the  relevant previous healthcare records. I obtained HPI from historian.  ED Course:  Assessment: Pt is a 44yF who presents with Left rib pain s/p mechanical fall on Tuesday. No LOC/head Trauma. On exam, pt in NAD. Nontoxic/nonseptic appearing. VSS. Afebrile. Lungs CTA. Heart RRR left ribs TTP on axilla. No deformities. Imaging reviewed from previous ED visit with no abnoarmalities. Plan is to DC home with Tramadol #8 and Spirometer. At time of discharge, Patient is in no acute distress. Vital Signs are stable. Patient is able to ambulate. Patient able to tolerate PO.    Disposition/Plan:  DC Home Additional Verbal discharge instructions given and discussed with patient.  Pt Instructed to f/u with PCP in the next week for evaluation and  treatment of symptoms. Return precautions given Pt acknowledges and agrees with plan  Supervising Physician Quintella Reichert, MD   Final diagnoses:  Rib pain on left side   I personally performed the services described in this documentation, which was scribed in my presence. The recorded information has been reviewed and is accurate.   Shary Decamp, PA-C 08/10/15 1517  Quintella Reichert, MD 08/11/15 0120

## 2015-08-10 NOTE — Discharge Instructions (Signed)
Please read and follow all provided instructions.  Your diagnoses today include:  1. Rib pain on left side    Tests performed today include:  Vital signs. See below for your results today.   Medications prescribed:   Take as prescribed   You can use Ibuprofen 400mg  combined with Tylenol 1000mg  for pain relief every 6 hours. Do not exceed 4g of Tylenol in one 24 hour period. Use narcotics if pain uncontrolled with the aforementioned regiment. Do not exceed 10 days of this regiment.  Home care instructions:  Follow any educational materials contained in this packet.  Follow-up instructions: Please follow-up with your primary care provider for further evaluation of symptoms and treatment   Return instructions:   Please return to the Emergency Department if you do not get better, if you get worse, or new symptoms OR  - Fever (temperature greater than 101.43F)  - Bleeding that does not stop with holding pressure to the area    -Severe pain (please note that you may be more sore the day after your accident)  - Chest Pain  - Difficulty breathing  - Severe nausea or vomiting  - Inability to tolerate food and liquids  - Passing out  - Skin becoming red around your wounds  - Change in mental status (confusion or lethargy)  - New numbness or weakness     Please return if you have any other emergent concerns.  Additional Information:  Your vital signs today were: BP 140/89 mmHg   Pulse 76   Temp(Src) 98 F (36.7 C) (Oral)   Resp 16   SpO2 97%   LMP 08/01/2015 If your blood pressure (BP) was elevated above 135/85 this visit, please have this repeated by your doctor within one month. ---------------

## 2015-08-10 NOTE — ED Notes (Signed)
Incentive spirometer given to patient, set to right place for her age and height and return demonstration given.

## 2015-08-10 NOTE — ED Notes (Signed)
Pt was seen here Tuesday after having a fall. Pt states she had an Xray of her elbow and ribs both negative but her pain has not gotten any better. Pt c/o soreness and states the pain medication did not help any.

## 2015-10-01 ENCOUNTER — Encounter (HOSPITAL_COMMUNITY): Payer: Self-pay | Admitting: *Deleted

## 2015-10-01 ENCOUNTER — Inpatient Hospital Stay (HOSPITAL_COMMUNITY)
Admission: AD | Admit: 2015-10-01 | Discharge: 2015-10-01 | Disposition: A | Discharge status: Home / Self Care | Payer: Medicaid Other | Source: Ambulatory Visit | Attending: Obstetrics & Gynecology | Admitting: Obstetrics & Gynecology

## 2015-10-01 DIAGNOSIS — Z8249 Family history of ischemic heart disease and other diseases of the circulatory system: Secondary | ICD-10-CM | POA: Insufficient documentation

## 2015-10-01 DIAGNOSIS — R102 Pelvic and perineal pain: Secondary | ICD-10-CM | POA: Diagnosis not present

## 2015-10-01 DIAGNOSIS — K219 Gastro-esophageal reflux disease without esophagitis: Secondary | ICD-10-CM | POA: Diagnosis not present

## 2015-10-01 DIAGNOSIS — Z79899 Other long term (current) drug therapy: Secondary | ICD-10-CM | POA: Diagnosis not present

## 2015-10-01 DIAGNOSIS — J45909 Unspecified asthma, uncomplicated: Secondary | ICD-10-CM | POA: Diagnosis not present

## 2015-10-01 DIAGNOSIS — I10 Essential (primary) hypertension: Secondary | ICD-10-CM | POA: Insufficient documentation

## 2015-10-01 DIAGNOSIS — Z7951 Long term (current) use of inhaled steroids: Secondary | ICD-10-CM | POA: Diagnosis not present

## 2015-10-01 DIAGNOSIS — N939 Abnormal uterine and vaginal bleeding, unspecified: Secondary | ICD-10-CM

## 2015-10-01 LAB — CBC
HCT: 35.3 % — ABNORMAL LOW (ref 36.0–46.0)
Hemoglobin: 11.6 g/dL — ABNORMAL LOW (ref 12.0–15.0)
MCH: 28 pg (ref 26.0–34.0)
MCHC: 32.9 g/dL (ref 30.0–36.0)
MCV: 85.1 fL (ref 78.0–100.0)
PLATELETS: 302 10*3/uL (ref 150–400)
RBC: 4.15 MIL/uL (ref 3.87–5.11)
RDW: 13.9 % (ref 11.5–15.5)
WBC: 6.8 10*3/uL (ref 4.0–10.5)

## 2015-10-01 LAB — WET PREP, GENITAL
Clue Cells Wet Prep HPF POC: NONE SEEN
SPERM: NONE SEEN
TRICH WET PREP: NONE SEEN
YEAST WET PREP: NONE SEEN

## 2015-10-01 LAB — URINALYSIS, ROUTINE W REFLEX MICROSCOPIC
BILIRUBIN URINE: NEGATIVE
Glucose, UA: NEGATIVE mg/dL
Ketones, ur: NEGATIVE mg/dL
Leukocytes, UA: NEGATIVE
NITRITE: NEGATIVE
PH: 6 (ref 5.0–8.0)
Protein, ur: NEGATIVE mg/dL
SPECIFIC GRAVITY, URINE: 1.025 (ref 1.005–1.030)

## 2015-10-01 LAB — POCT PREGNANCY, URINE: PREG TEST UR: NEGATIVE

## 2015-10-01 LAB — URINE MICROSCOPIC-ADD ON

## 2015-10-01 LAB — GC/CHLAMYDIA PROBE AMP (~~LOC~~) NOT AT ARMC
CHLAMYDIA, DNA PROBE: NEGATIVE
Neisseria Gonorrhea: NEGATIVE

## 2015-10-01 MED ORDER — KETOROLAC TROMETHAMINE 60 MG/2ML IM SOLN
60.0000 mg | Freq: Once | INTRAMUSCULAR | Status: AC
  Administered 2015-10-01: 60 mg via INTRAMUSCULAR
  Filled 2015-10-01: qty 2

## 2015-10-01 MED ORDER — MEGESTROL ACETATE 40 MG PO TABS: 40.0000 mg | ORAL_TABLET | Freq: Two times a day (BID) | ORAL | 1 refills | Status: DC

## 2015-10-01 NOTE — Discharge Instructions (Signed)

## 2015-10-01 NOTE — MAU Provider Note (Signed)
History     CSN: [id number]  Arrival date and time: 10/01/15 0001   First Provider Initiated Contact with Patient 10/01/15 0036      Chief Complaint  Patient presents with  . Vaginal Bleeding   Vaginal Bleeding  The patient's primary symptoms include pelvic pain and vaginal bleeding. This is a new problem. The current episode started 1 to 4 weeks ago. The problem occurs intermittently. The problem has been unchanged. Pain severity now: 8/10  The problem affects both sides. She is not pregnant. Associated symptoms include abdominal pain. Pertinent negatives include no chills, constipation, diarrhea, dysuria, fever, frequency, nausea, urgency or vomiting. The vaginal discharge was bloody. The vaginal bleeding is typical of menses (varies. Can be lighter than a period and then similar to a period. ). She has been passing clots (about the size of a nickel ). She has not been passing tissue. Nothing aggravates the symptoms. She has tried nothing for the symptoms. She is sexually active. She uses tubal ligation for contraception. Menstrual history: LNMP: 07/30/15     Past Medical History:  Diagnosis Date  . Abdominal pain, unspecified site   . Allergy   . Anemia   . Asthma   . Chlamydia   . Diverticulitis   . Diverticulosis   . Esophageal reflux   . Hiatal hernia   . Hypertension   . IBS (irritable bowel syndrome)   . Lactose intolerance   . Migraine   . Other constipation   . Personal history of colonic polyps 09/12/2010   tubular adenoma  . Tubular adenoma of colon   . Umbilical hernia   . Uterine fibroid     Past Surgical History:  Procedure Laterality Date  . APPENDECTOMY    . APPENDECTOMY    . TUBAL LIGATION      Family History  Problem Relation Age of Onset  . Hypertension Mother   . Cancer Mother     not sure of type  . Colon cancer Neg Hx   . Esophageal cancer Neg Hx   . Rectal cancer Neg Hx   . Stomach cancer Neg Hx     Social History  Substance Use Topics   . Smoking status: Never Smoker  . Smokeless tobacco: Never Used  . Alcohol use Yes     Comment: occasionally    Allergies: No Known Allergies  Prescriptions Prior to Admission  Medication Sig Dispense Refill Last Dose  . ibuprofen (ADVIL,MOTRIN) 800 MG tablet Take 1 tablet (800 mg total) by mouth 3 (three) times daily. 15 tablet 0 Past Month at Unknown time  . QVAR 40 MCG/ACT inhaler Inhale 2 puffs into the lungs 2 (two) times daily as needed (shortness of breath).   3 Past Week at Unknown time  . albuterol (PROVENTIL HFA;VENTOLIN HFA) 108 (90 BASE) MCG/ACT inhaler Inhale 1-2 puffs into the lungs every 6 (six) hours as needed for wheezing or shortness of breath. Reported on 04/05/2015   More than a month at Unknown time  . amoxicillin-clavulanate (AUGMENTIN) 875-125 MG tablet Take 1 tablet by mouth 2 (two) times daily. (Patient not taking: Reported on 08/06/2015) 12 tablet 0 More than a month at Unknown time  . cyclobenzaprine (FLEXERIL) 5 MG tablet Take 1 tablet (5 mg total) by mouth 2 (two) times daily as needed for muscle spasms. 10 tablet 0 More than a month at Unknown time  . losartan (COZAAR) 50 MG tablet Take 50 mg by mouth daily.  1 More than a month  at Unknown time  . montelukast (SINGULAIR) 10 MG tablet Take 10 mg by mouth daily as needed (allergies).   2 More than a month at Unknown time  . pantoprazole (PROTONIX) 40 MG tablet Take 1 tablet (40 mg total) by mouth daily. (Patient taking differently: Take 40 mg by mouth daily as needed (heartburn). ) 30 tablet 6 More than a month at Unknown time  . sucralfate (CARAFATE) 1 GM/10ML suspension Take 10 mLs (1 g total) by mouth 4 (four) times daily -  with meals and at bedtime. (Patient taking differently: Take 1 g by mouth 4 (four) times daily as needed (stomach pain). ) 420 mL 0 More than a month at Unknown time  . traMADol (ULTRAM) 50 MG tablet Take 1 tablet (50 mg total) by mouth every 6 (six) hours as needed. 8 tablet 0 More than a month  at Unknown time    Review of Systems  Constitutional: Negative for chills and fever.  Gastrointestinal: Positive for abdominal pain. Negative for constipation, diarrhea, nausea and vomiting.  Genitourinary: Positive for pelvic pain and vaginal bleeding. Negative for dysuria, frequency and urgency.   Physical Exam   Last menstrual period 08/28/2015.  Physical Exam  Nursing note and vitals reviewed. Constitutional: She is oriented to person, place, and time. She appears well-developed and well-nourished. No distress.  HENT:  Head: Normocephalic.  Cardiovascular: Normal rate.   Respiratory: Effort normal.  GI: Soft. There is no tenderness. There is no rebound.  Genitourinary:  Genitourinary Comments:  External: no lesion Vagina: small amount of Gebel discharge Cervix: pink, smooth, no CMT Uterus: NSSC Adnexa: NT   Neurological: She is alert and oriented to person, place, and time.  Skin: Skin is warm and dry.  Psychiatric: She has a normal mood and affect.   Results for orders placed or performed during the hospital encounter of 10/01/15 (from the past 24 hour(s))  Wet prep, genital     Status: Abnormal   Collection Time: 10/01/15 12:45 AM  Result Value Ref Range   Yeast Wet Prep HPF POC NONE SEEN NONE SEEN   Trich, Wet Prep NONE SEEN NONE SEEN   Clue Cells Wet Prep HPF POC NONE SEEN NONE SEEN   WBC, Wet Prep HPF POC FEW (A) NONE SEEN   Sperm NONE SEEN   CBC     Status: Abnormal   Collection Time: 10/01/15  1:17 AM  Result Value Ref Range   WBC 6.8 4.0 - 10.5 K/uL   RBC 4.15 3.87 - 5.11 MIL/uL   Hemoglobin 11.6 (L) 12.0 - 15.0 g/dL   HCT 35.3 (L) 36.0 - 46.0 %   MCV 85.1 78.0 - 100.0 fL   MCH 28.0 26.0 - 34.0 pg   MCHC 32.9 30.0 - 36.0 g/dL   RDW 13.9 11.5 - 15.5 %   Platelets 302 150 - 400 K/uL  Pregnancy, urine POC     Status: None   Collection Time: 10/01/15  1:19 AM  Result Value Ref Range   Preg Test, Ur NEGATIVE NEGATIVE    MAU Course   Procedures  MDM Patient has had toradol. She reports that her pain is better Reviewed labs with the patient.    Assessment and Plan   1. Abnormal uterine bleeding (AUB)    DC home Comfort measures reviewed  Bleeding precautions RX: megace 40mg  BID #60  Return to MAU as needed FU with OB as planned  [place] [facility] .  Specialty:  Obstetrics and Gynecology Why:  They will call you with an appointment  Contact information: [place] [phone]           Mathis Bud 10/01/2015, 12:43 AM

## 2015-10-01 NOTE — MAU Note (Signed)
WENT  TO B-ROOM

## 2015-10-01 NOTE — MAU Note (Signed)
Pt has been bleeding off and on for the month of august with intermittent left lower abd pain.  Pt did not take anything for the pain and reports her tubes were tied 14 yrs ago.  She has never experienced extended bleeding like this and was concerned.  Does not have a GYN MD. Denies any fever, vag discharge or dysuria.

## 2015-10-14 ENCOUNTER — Ambulatory Visit (HOSPITAL_COMMUNITY)
Admission: RE | Admit: 2015-10-14 | Discharge: 2015-10-14 | Disposition: A | Discharge status: Home / Self Care | Payer: Medicaid Other | Source: Ambulatory Visit | Attending: Advanced Practice Midwife | Admitting: Advanced Practice Midwife

## 2015-10-14 DIAGNOSIS — N939 Abnormal uterine and vaginal bleeding, unspecified: Secondary | ICD-10-CM | POA: Diagnosis not present

## 2015-10-14 DIAGNOSIS — R102 Pelvic and perineal pain: Secondary | ICD-10-CM | POA: Diagnosis not present

## 2015-10-14 DIAGNOSIS — D250 Submucous leiomyoma of uterus: Secondary | ICD-10-CM | POA: Diagnosis not present

## 2015-11-04 ENCOUNTER — Other Ambulatory Visit (HOSPITAL_COMMUNITY)
Admission: RE | Admit: 2015-11-04 | Discharge: 2015-11-04 | Disposition: A | Discharge status: Home / Self Care | Payer: Medicaid Other | Source: Ambulatory Visit | Attending: Obstetrics and Gynecology | Admitting: Obstetrics and Gynecology

## 2015-11-04 ENCOUNTER — Ambulatory Visit (INDEPENDENT_AMBULATORY_CARE_PROVIDER_SITE_OTHER): Payer: Self-pay | Admitting: Obstetrics and Gynecology

## 2015-11-04 ENCOUNTER — Encounter: Payer: Self-pay | Admitting: Obstetrics and Gynecology

## 2015-11-04 VITALS — BP 139/83 | HR 71 | Wt 203.8 lb

## 2015-11-04 DIAGNOSIS — N939 Abnormal uterine and vaginal bleeding, unspecified: Secondary | ICD-10-CM | POA: Insufficient documentation

## 2015-11-04 DIAGNOSIS — Z124 Encounter for screening for malignant neoplasm of cervix: Secondary | ICD-10-CM

## 2015-11-04 DIAGNOSIS — Z01419 Encounter for gynecological examination (general) (routine) without abnormal findings: Secondary | ICD-10-CM | POA: Insufficient documentation

## 2015-11-04 DIAGNOSIS — Z113 Encounter for screening for infections with a predominantly sexual mode of transmission: Secondary | ICD-10-CM | POA: Insufficient documentation

## 2015-11-04 DIAGNOSIS — Z1151 Encounter for screening for human papillomavirus (HPV): Secondary | ICD-10-CM | POA: Diagnosis not present

## 2015-11-04 NOTE — Progress Notes (Signed)
Obstetrics and Gynecology Visit New Patient Evaluation  Appointment Date: 11/04/2015  OBGYN Clinic: [facility]  Primary Care Provider: Smothers, Andree Elk  Referring Provider: MAU  Chief Complaint:  Chief Complaint  Patient presents with  . Vaginal Bleeding  . Follow-up    History of Present Illness: Briana Wade is a 45 y.o. African-American [id number] (LMP: unknown), seen for the above chief complaint. Her past medical history is significant for HTN, h/o BTL, BMI 35, HTN, migraines, h/o sexual infections.   Patient seen in the MAU on 9/5 for AUB for several months, which was non painful. Hct 35 and negative wet prep, GC/CT and UPT and u/s showed 10.7 x 7.2 x 7.6cm uterus with ES 56mm and multiple fibroids, notably a 1.6cm submucosal fibroid. Patient discharged to home on megace 40 bid, which she last took two days ago when she ran out. Patient states her bleeding stopped a few days after being seen in the MAU. Prior to going to the MAU, she had regular, qmonth periods that lasted for about 4-5d and not really painful or heavy.   No current VB and no discharge, itching, nausea, vomiting, chest pain, sob, fevers, chills, abdominal pain, dysuria, hematuria and no climateric s/s.   Review of Systems: Her 12 point review of systems is negative or as noted in the History of Present Illness.  Patient Active Problem List   Diagnosis Date Noted  . Diverticula of colon 05/21/2015  . Asthma 05/21/2015  . Acute diverticulitis 05/21/2015  . Diverticulitis of sigmoid colon 06/02/2011  . Nausea and vomiting 06/02/2011  . Otitis 06/02/2011  . HTN (hypertension) 06/02/2011  . GERD (gastroesophageal reflux disease) 10/21/2010  . Gluten intolerance 10/21/2010  . Personal history of colonic polyps 10/21/2010  . Flatulence, eructation, and gas pain 09/12/2010  . 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 Irritable bowel syndrome  09/12/2010  . Abdominal pain 08/26/2010  . Bloating 08/26/2010  . 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 IBS (irritable bowel syndrome) 08/26/2010  . CONSTIPATION, CHRONIC 05/11/2007  . ABDOMINAL PAIN, CHRONIC 05/11/2007    Past Medical History:  Past Medical History:  Diagnosis Date  . Abdominal pain, unspecified site   . Allergy   . Anemia   . Asthma   . Chlamydia   . Diverticulitis   . Esophageal reflux   . Hiatal hernia   . Hypertension   . IBS (irritable bowel syndrome)   . Lactose intolerance   . Migraine   . Other constipation   . Personal history of colonic polyps 09/12/2010   tubular adenoma  . Trichimoniasis   . Tubular adenoma of colon   . Umbilical hernia   . Uterine fibroid     Past Surgical History:  Past Surgical History:  Procedure Laterality Date  . APPENDECTOMY    . APPENDECTOMY    . DILATION AND CURETTAGE OF UTERUS     x2 for EAB  . TUBAL LIGATION      Past Obstetrical History:  OB History  Gravida Para Term Preterm AB Living  7 3 3   4 3   SAB TAB Ectopic Multiple Live Births  1 3          # Outcome Date GA Lbr Len/2nd Weight Sex Delivery Anes PTL Lv  7 TAB           6 TAB           5 TAB  4 SAB           3 Term           2 Term           1 Term               SVD x 3  Past Gynecological History: As per HPI. No. history of abnormal pap smears (last pap smear: pt unsure. 2012 negative pap smear in the system) Yes.   history of STIs.    Social History:  Social History   Social History  . Marital status: Single    Spouse name: N/A  . Number of children: 3  . Years of education: N/A   Occupational History  . Unemployed    Social History Main Topics  . Smoking status: Never Smoker  . Smokeless tobacco: Never Used  . Alcohol use Yes     Comment: occasionally  . Drug use: No  . Sexual activity: Yes    Birth control/ protection: Surgical   Other Topics Concern  . Not on file   Social History Narrative   . No narrative on file    Family History:  Family History  Problem Relation Age of Onset  . Hypertension Mother   . Cancer Mother     not sure of type  . Colon cancer Neg Hx   . Esophageal cancer Neg Hx   . Rectal cancer Neg Hx   . Stomach cancer Neg Hx    She denis any female cancers, bleeding or blood clotting disorders.   Health Maintenance:  Mammogram Yes.  , which was done in 2017 and negative;   Medications [name] had no medications administered during this visit. Current Outpatient Prescriptions  Medication Sig Dispense Refill  . albuterol (PROVENTIL HFA;VENTOLIN HFA) 108 (90 BASE) MCG/ACT inhaler Inhale 1-2 puffs into the lungs every 6 (six) hours as needed for wheezing or shortness of breath. Reported on 04/05/2015    . ibuprofen (ADVIL,MOTRIN) 800 MG tablet Take 1 tablet (800 mg total) by mouth 3 (three) times daily. 15 tablet 0  . losartan (COZAAR) 50 MG tablet Take 50 mg by mouth daily.  1  . montelukast (SINGULAIR) 10 MG tablet Take 10 mg by mouth daily as needed (allergies).   2  . pantoprazole (PROTONIX) 40 MG tablet Take 1 tablet (40 mg total) by mouth daily. (Patient taking differently: Take 40 mg by mouth daily as needed (heartburn). ) 30 tablet 6  . QVAR 40 MCG/ACT inhaler Inhale 2 puffs into the lungs 2 (two) times daily as needed (shortness of breath).   3  . sucralfate (CARAFATE) 1 GM/10ML suspension Take 10 mLs (1 g total) by mouth 4 (four) times daily -  with meals and at bedtime. (Patient taking differently: Take 1 g by mouth 4 (four) times daily as needed (stomach pain). ) 420 mL 0  . amoxicillin-clavulanate (AUGMENTIN) 875-125 MG tablet Take 1 tablet by mouth 2 (two) times daily. (Patient not taking: Reported on 11/04/2015) 12 tablet 0  . cyclobenzaprine (FLEXERIL) 5 MG tablet Take 1 tablet (5 mg total) by mouth 2 (two) times daily as needed for muscle spasms. (Patient not taking: Reported on 11/04/2015) 10 tablet 0  . megestrol (MEGACE) 40 MG tablet Take  1 tablet (40 mg total) by mouth 2 (two) times daily. (Patient not taking: Reported on 11/04/2015) 60 tablet 1  . traMADol (ULTRAM) 50 MG tablet Take 1 tablet (50 mg total) by mouth every 6 (  six) hours as needed. (Patient not taking: Reported on 11/04/2015) 8 tablet 0   No current facility-administered medications for this visit.     Allergies Review of patient's allergies indicates no known allergies.   Physical Exam:  BP 139/83   Pulse 71   Wt 203 lb 12.8 oz (92.4 kg)   LMP 07/29/2015 (Within Days)   BMI 33.91 kg/m  Body mass index is 33.91 kg/m. General appearance: Well nourished, well developed female in no acute distress.  Neck:  Supple, normal appearance, and no thyromegaly  Cardiovascular: normal s1 and s2.  No murmurs, rubs or gallops. Respiratory:  Clear to auscultation bilateral. Normal respiratory effort Abdomen: positive bowel sounds and no masses, hernias; diffusely non tender to palpation, non distended Neuro/Psych:  Normal mood and affect.  Skin:  Warm and dry.  Lymphatic:  No inguinal lymphadenopathy.   Pelvic exam: is not limited by body habitus EGBUS: within normal limits, Vagina: within normal limits and with no blood in the vault, Cervix: normal appearing cervix without discharge or lesions. Uterus:  nonenlarged, and Adnexa:  normal adnexa and no mass, fullness, tenderness Rectovaginal: deferred  See procedure note for uncomplicated EMBx  Laboratory: as above  Radiology: as above  CLINICAL DATA:  Abnormal uterine bleeding, pelvic pain  EXAM: TRANSABDOMINAL AND TRANSVAGINAL ULTRASOUND OF PELVIS  TECHNIQUE: Both transabdominal and transvaginal ultrasound examinations of the pelvis were performed. Transabdominal technique was performed for global imaging of the pelvis including uterus, ovaries, adnexal regions, and pelvic cul-de-sac. It was necessary to proceed with endovaginal exam following the transabdominal exam to visualize the bilateral  ovaries.  COMPARISON:  CT abdomen pelvis dated 05/16/2015  FINDINGS: Uterus  Measurements: 10.7 x 7.2 x 7.6 cm. Multiple uterine fibroids, including:  --3.6 x 2.6 x 3.8 cm subserosal fibroid in the posterior uterine body  --2.3 x 1.9 x 1.6 cm intramural fibroid in the anterior uterine body  --2.3 x 1.6 x 2.1 cm intramural fibroid in the anterior uterine fundus  --1.6 x 1.7 x 1.7 cm submucosal fibroid along the posterior uterine body with associated mucosal component  Endometrium  Thickness: 14 mm. Submucosal fibroid with mucosal component, as noted above.  Right ovary  Measurements: 2.2 x 1.4 x 1.8 cm. Normal appearance/no adnexal mass.  Left ovary  Measurements: 3.5 x 2.4 x 2.6 cm. Normal appearance/no adnexal mass.  Other findings  No abnormal free fluid.  IMPRESSION: Multiple uterine fibroids, including a dominant 3.8 cm subserosal fibroid in the posterior uterine body.  Note is made of a 1.7 cm submucosal fibroid along the posterior uterine body with associated mucosal component.   Electronically Signed   By: Julian Hy M.D.   On: 10/14/2015 10:08   Assessment: AUB. Pt doing well  Plan:  Follow up pap and embx today Pt told that I would recommend staying off the progestin, having a withdrawal bleed, which should happen sometime in the next few days to a week and then see if her bleeding becomes regular, since size of SM fibroid means that it's likely not new. If AUB continues, pt told to call us and can phone in more megace and then talk to her about options to help with the AUB at that time If pathology comes back concerning then will d/w her re: options at that time. Trich testing done today given embx done and her prior history.   Pt amenable to plan.   RTC 2-87m and call pt with results and change f/u PRN.   Eduard Clos  Cassell Smiles MD Attending Center for [facility])

## 2015-11-04 NOTE — Procedures (Signed)
Endometrial Biopsy Procedure Note  Pre-operative Diagnosis: AUB, fibroids  Post-operative Diagnosis: same  Procedure Details  Urine pregnancy test was not done.  The risks (including infection, bleeding, pain, and uterine perforation) and benefits of the procedure were explained to the patient and Written informed consent was obtained.  The patient was placed in the dorsal lithotomy position.  Bimanual exam showed the uterus to be in the neutral position.  A Graves' speculum inserted in the vagina, and the cervix was visualized and a pap smear performed. The cervix was then prepped with povidone iodine, and a sharp tenaculum was applied to the anterior lip of the cervix for stabilization.  A pipelle was inserted into the uterine cavity and sounded the uterus to a depth of 9.5cm.  A Large amount of tissue was collected after 2 passes. The sample was sent for pathologic examination.  Condition: Stable  Complications: None  Plan: The patient was advised to call for any fever or for prolonged or severe pain or bleeding. She was advised to use OTC analgesics as needed for mild to moderate pain. She was advised to avoid vaginal intercourse for 48 hours or until the bleeding has completely stopped.  Durene Romans MD Attending [facility] [employer])

## 2015-11-06 LAB — CYTOLOGY - PAP

## 2015-11-11 ENCOUNTER — Telehealth: Payer: Self-pay | Admitting: General Practice

## 2015-11-11 ENCOUNTER — Encounter: Payer: Self-pay | Admitting: Obstetrics and Gynecology

## 2015-11-11 ENCOUNTER — Encounter: Payer: Self-pay | Admitting: General Practice

## 2015-11-11 ENCOUNTER — Telehealth: Payer: Self-pay | Admitting: Obstetrics and Gynecology

## 2015-11-11 DIAGNOSIS — B379 Candidiasis, unspecified: Secondary | ICD-10-CM

## 2015-11-11 MED ORDER — NORETHINDRONE ACETATE 5 MG PO TABS: ORAL_TABLET | ORAL | 2 refills | Status: DC

## 2015-11-11 MED ORDER — FLUCONAZOLE 150 MG PO TABS: 150.0000 mg | ORAL_TABLET | Freq: Once | ORAL | 0 refills | Status: AC

## 2015-11-11 NOTE — Telephone Encounter (Signed)
Called patient and informed her that I discussed her concerns with her bleeding with Dr Ilda Basset who will be giving her a call this afternoon. Asked patient what insurance she had as that could be helpful in determining the medication/treatment given. Patient states medicaid. Told patient he will be in touch with her today. Patient verbalized understanding and had no questions

## 2015-11-11 NOTE — Progress Notes (Unsigned)
Patient came into office requesting results & states despite taking megace BID x 2 days she is still bleeding and wants this to stop. Informed patient of negative results but pap showing yeast infection. Told patient we will send in a Rx to her pharmacy & I will call her when I talk with Dr Ilda Basset about her bleeding. Patient verbalized understanding & had no questions

## 2015-11-11 NOTE — Telephone Encounter (Signed)
GYN Telephone Note  Patient called at [phone]. Has had bleeding since embx, pap last week. Reviewed with her negative pathology results. Patient having bleeding similar to period but longer than the normal 4-5d period she used to have. Took two of the megace pills she had leftover but then ran out and no change in bleeding. No s/s of anemia and minimal cramping. Will do aygestin taper and then request appointment for one month. D/w her that will talk at that re: medical management (IUD, pills) long term or consider something surgical like ablation. Pt amenable to plan  Durene Romans MD Attending Center for [facility])

## 2015-11-15 ENCOUNTER — Telehealth: Payer: Self-pay

## 2015-11-15 NOTE — Telephone Encounter (Signed)
Received call from patient in regards to her continued to bleed after taking the new medications. Patient was confused by the directions and wanted to make sure she was taking them right. Patient stated the pharmacy did give her the directions that were written on the Aygestin 5 mg rx. I advised patient the doctor writes the prescription and the pharmacy follow the directions.  I offered patient to come to the office

## 2015-11-18 ENCOUNTER — Telehealth: Payer: Self-pay

## 2015-11-18 NOTE — Telephone Encounter (Addendum)
  Patient presented to office today to discuss her bleeding. Patient was put on Aygestin but has been taken correctly. She is concern about her BLEEDING. Marland Kitchen She would like to have a medication called in to stop her bleeding... Please Advise

## 2015-11-20 ENCOUNTER — Telehealth: Payer: Self-pay | Admitting: Obstetrics and Gynecology

## 2015-11-20 MED ORDER — TRANEXAMIC ACID 650 MG PO TABS: 1300.0000 mg | ORAL_TABLET | Freq: Three times a day (TID) | ORAL | 0 refills | Status: DC

## 2015-11-20 NOTE — Telephone Encounter (Signed)
GYN Telephone Note 11/20/2015 0849  Patient states she is still bleeding and has been bleeding (not heavy) but continuous since office visit and got confusing instructions on label for aygestin so started her megace but that's not helping. D/w her r/b of lysteda and she'd like to try this. This was sent in and pt told to d/c aygestin and megace and has appt for November to go over options   Aletha Halim, Brooke Bonito MD Attending [facility] [employer])

## 2015-11-22 ENCOUNTER — Inpatient Hospital Stay (HOSPITAL_COMMUNITY)
Admission: AD | Admit: 2015-11-22 | Discharge: 2015-11-22 | Disposition: A | Discharge status: Home / Self Care | Payer: Medicaid Other | Source: Ambulatory Visit | Attending: Obstetrics and Gynecology | Admitting: Obstetrics and Gynecology

## 2015-11-22 ENCOUNTER — Encounter (HOSPITAL_COMMUNITY): Payer: Self-pay | Admitting: *Deleted

## 2015-11-22 DIAGNOSIS — N939 Abnormal uterine and vaginal bleeding, unspecified: Secondary | ICD-10-CM

## 2015-11-22 LAB — CBC
HEMATOCRIT: 37.4 % (ref 36.0–46.0)
Hemoglobin: 12.3 g/dL (ref 12.0–15.0)
MCH: 27.6 pg (ref 26.0–34.0)
MCHC: 32.9 g/dL (ref 30.0–36.0)
MCV: 83.9 fL (ref 78.0–100.0)
Platelets: 327 10*3/uL (ref 150–400)
RBC: 4.46 MIL/uL (ref 3.87–5.11)
RDW: 14.5 % (ref 11.5–15.5)
WBC: 6.6 10*3/uL (ref 4.0–10.5)

## 2015-11-22 LAB — URINALYSIS, ROUTINE W REFLEX MICROSCOPIC
BILIRUBIN URINE: NEGATIVE
Glucose, UA: NEGATIVE mg/dL
KETONES UR: NEGATIVE mg/dL
Leukocytes, UA: NEGATIVE
NITRITE: NEGATIVE
PROTEIN: NEGATIVE mg/dL
pH: 5.5 (ref 5.0–8.0)

## 2015-11-22 LAB — URINE MICROSCOPIC-ADD ON
BACTERIA UA: NONE SEEN
WBC, UA: NONE SEEN WBC/hpf (ref 0–5)

## 2015-11-22 LAB — POCT PREGNANCY, URINE: PREG TEST UR: NEGATIVE

## 2015-11-22 MED ORDER — MEGESTROL ACETATE 40 MG PO TABS
80.0000 mg | ORAL_TABLET | Freq: Every day | ORAL | Status: DC
  Administered 2015-11-22: 80 mg via ORAL
  Filled 2015-11-22 (×2): qty 2

## 2015-11-22 MED ORDER — MEGESTROL ACETATE 40 MG PO TABS: 80.0000 mg | ORAL_TABLET | Freq: Two times a day (BID) | ORAL | 1 refills | Status: DC

## 2015-11-22 NOTE — MAU Provider Note (Signed)
History   [id number]   Chief Complaint  Patient presents with  . Vaginal Bleeding    HPI Briana Wade is a 45 y.o. female  [phone] here with report of increased bleeding that has not improved over the past three weeks.  Receives care at [facility].  Last visit with Dr. Ilda Basset on 11/04/15.  Pt reports increased bleeding after endometrial biopsy.  Given RX of Megace 40 mg with little relief.  New RX of Aygestin given.  Pt reports not taking the medication correctly.  Per last telephone encounter note pt was to discontinue Megace and Aygestin and begin Lysteda TID.  Pt does not desire to take the new medications and considering stopping all meds.  Frustrated because going out of town next week.  Open to taking higher dose of Megace.    Patient's last menstrual period was 11/04/2015.  OB History  Gravida Para Term Preterm AB Living  7 3 3   4 3   SAB TAB Ectopic Multiple Live Births  1 3          # Outcome Date GA Lbr Len/2nd Weight Sex Delivery Anes PTL Lv  7 TAB           6 TAB           5 TAB           4 SAB           3 Term           2 Term           1 Term               Past Medical History:  Diagnosis Date  . Abdominal pain, unspecified site   . Allergy   . Anemia   . Asthma   . Chlamydia   . Diverticulitis   . Esophageal reflux   . Hiatal hernia   . Hypertension   . IBS (irritable bowel syndrome)   . Lactose intolerance   . Migraine   . Other constipation   . Personal history of colonic polyps 09/12/2010   tubular adenoma  . Trichimoniasis   . Tubular adenoma of colon   . Umbilical hernia   . Uterine fibroid     Family History  Problem Relation Age of Onset  . Hypertension Mother   . Cancer Mother     not sure of type  . Colon cancer Neg Hx   . Esophageal cancer Neg Hx   . Rectal cancer Neg Hx   . Stomach cancer Neg Hx     Social History   Social History  . Marital status: Single    Spouse name: N/A  . Number of children: 3  . Years of  education: N/A   Occupational History  . Unemployed    Social History Main Topics  . Smoking status: Never Smoker  . Smokeless tobacco: Never Used  . Alcohol use Yes     Comment: occasionally  . Drug use: No  . Sexual activity: Yes    Birth control/ protection: Surgical   Other Topics Concern  . None   Social History Narrative  . None    No Known Allergies  No current facility-administered medications on file prior to encounter.    Current Outpatient Prescriptions on File Prior to Encounter  Medication Sig Dispense Refill  . albuterol (PROVENTIL HFA;VENTOLIN HFA) 108 (90 BASE) MCG/ACT inhaler Inhale 1-2 puffs into the lungs every 6 (six)  hours as needed for wheezing or shortness of breath. Reported on 04/05/2015    . amoxicillin-clavulanate (AUGMENTIN) 875-125 MG tablet Take 1 tablet by mouth 2 (two) times daily. (Patient not taking: Reported on 11/04/2015) 12 tablet 0  . cyclobenzaprine (FLEXERIL) 5 MG tablet Take 1 tablet (5 mg total) by mouth 2 (two) times daily as needed for muscle spasms. (Patient not taking: Reported on 11/04/2015) 10 tablet 0  . ibuprofen (ADVIL,MOTRIN) 800 MG tablet Take 1 tablet (800 mg total) by mouth 3 (three) times daily. 15 tablet 0  . losartan (COZAAR) 50 MG tablet Take 50 mg by mouth daily.  1  . megestrol (MEGACE) 40 MG tablet Take 1 tablet (40 mg total) by mouth 2 (two) times daily. (Patient not taking: Reported on 11/04/2015) 60 tablet 1  . montelukast (SINGULAIR) 10 MG tablet Take 10 mg by mouth daily as needed (allergies).   2  . pantoprazole (PROTONIX) 40 MG tablet Take 1 tablet (40 mg total) by mouth daily. (Patient taking differently: Take 40 mg by mouth daily as needed (heartburn). ) 30 tablet 6  . QVAR 40 MCG/ACT inhaler Inhale 2 puffs into the lungs 2 (two) times daily as needed (shortness of breath).   3  . sucralfate (CARAFATE) 1 GM/10ML suspension Take 10 mLs (1 g total) by mouth 4 (four) times daily -  with meals and at bedtime. (Patient  taking differently: Take 1 g by mouth 4 (four) times daily as needed (stomach pain). ) 420 mL 0  . traMADol (ULTRAM) 50 MG tablet Take 1 tablet (50 mg total) by mouth every 6 (six) hours as needed. (Patient not taking: Reported on 11/04/2015) 8 tablet 0  . tranexamic acid (LYSTEDA) 650 MG TABS tablet Take 2 tablets (1,300 mg total) by mouth 3 (three) times daily. 30 tablet 0     Review of Systems  Genitourinary: Positive for pelvic pain (mild) and vaginal bleeding.  Neurological: Negative for dizziness, light-headedness and headaches.  All other systems reviewed and are negative.    Physical Exam   Vitals:   11/22/15 1804  BP: 142/91  Pulse: 96  Resp: 20  Temp: 98.2 F (36.8 C)  TempSrc: Oral  Weight: 208 lb 1.9 oz (94.4 kg)    Physical Exam  Constitutional: She is oriented to person, place, and time. She appears well-developed and well-nourished.  HENT:  Head: Normocephalic.  Neck: Normal range of motion. Neck supple.  Cardiovascular: Normal rate, regular rhythm and normal heart sounds.   Respiratory: Effort normal and breath sounds normal.  GI: Soft. She exhibits no mass. There is no tenderness. There is no guarding.  Genitourinary: Uterus is enlarged. There is bleeding (negative clots; minimal bleeding; no abnormal masses seen) in the vagina.  Neurological: She is alert and oriented to person, place, and time. She has normal reflexes.  Skin: Skin is warm and dry.    MAU Course  Procedures  MDM Results for orders placed or performed during the hospital encounter of 11/22/15 (from the past 24 hour(s))  Urinalysis, Routine w reflex microscopic (not at [facility])     Status: Abnormal   Collection Time: 11/22/15  6:08 PM  Result Value Ref Range   Color, Urine YELLOW YELLOW   APPearance HAZY (A) CLEAR   Specific Gravity, Urine >1.030 (H) 1.005 - 1.030   pH 5.5 5.0 - 8.0   Glucose, UA NEGATIVE NEGATIVE mg/dL   Hgb urine dipstick LARGE (A) NEGATIVE   Bilirubin Urine NEGATIVE  NEGATIVE  Ketones, ur NEGATIVE NEGATIVE mg/dL   Protein, ur NEGATIVE NEGATIVE mg/dL   Nitrite NEGATIVE NEGATIVE   Leukocytes, UA NEGATIVE NEGATIVE  Urine microscopic-add on     Status: Abnormal   Collection Time: 11/22/15  6:08 PM  Result Value Ref Range   Squamous Epithelial / LPF 0-5 (A) NONE SEEN   WBC, UA NONE SEEN 0 - 5 WBC/hpf   RBC / HPF 6-30 0 - 5 RBC/hpf   Bacteria, UA NONE SEEN NONE SEEN  Pregnancy, urine POC     Status: None   Collection Time: 11/22/15  6:21 PM  Result Value Ref Range   Preg Test, Ur NEGATIVE NEGATIVE  CBC     Status: None   Collection Time: 11/22/15  6:44 PM  Result Value Ref Range   WBC 6.6 4.0 - 10.5 K/uL   RBC 4.46 3.87 - 5.11 MIL/uL   Hemoglobin 12.3 12.0 - 15.0 g/dL   HCT 37.4 36.0 - 46.0 %   MCV 83.9 78.0 - 100.0 fL   MCH 27.6 26.0 - 34.0 pg   MCHC 32.9 30.0 - 36.0 g/dL   RDW 14.5 11.5 - 15.5 %   Platelets 327 150 - 400 K/uL     Assessment and Plan  Abnormal Uterine Bleeding  Plan: Megace 80 mg BID Reviewed bleeding precautions Follow-up with Dr. Ilda Basset as scheduled  Gwen Pounds, CNM 11/22/2015 7:29 PM

## 2015-11-22 NOTE — MAU Note (Signed)
Is bleeding, is a lot. Was here before for the same problems.  Was given rx to stop bleeding.  Was seen in clinic.  Had a papsmear and a biopsy. Bleeding started again, is driving her insane. It is not stopping.

## 2015-11-22 NOTE — Discharge Instructions (Signed)

## 2015-11-25 ENCOUNTER — Inpatient Hospital Stay (HOSPITAL_COMMUNITY)
Admission: AD | Admit: 2015-11-25 | Discharge: 2015-11-25 | Disposition: A | Discharge status: Home / Self Care | Payer: Medicaid Other | Source: Ambulatory Visit | Attending: Family Medicine | Admitting: Family Medicine

## 2015-11-25 ENCOUNTER — Telehealth: Payer: Self-pay | Admitting: Student

## 2015-11-25 ENCOUNTER — Encounter (HOSPITAL_COMMUNITY): Payer: Self-pay | Admitting: *Deleted

## 2015-11-25 DIAGNOSIS — N939 Abnormal uterine and vaginal bleeding, unspecified: Secondary | ICD-10-CM

## 2015-11-25 DIAGNOSIS — N920 Excessive and frequent menstruation with regular cycle: Secondary | ICD-10-CM | POA: Insufficient documentation

## 2015-11-25 LAB — URINE MICROSCOPIC-ADD ON: WBC, UA: NONE SEEN WBC/hpf (ref 0–5)

## 2015-11-25 LAB — CBC
HEMATOCRIT: 36.9 % (ref 36.0–46.0)
Hemoglobin: 12 g/dL (ref 12.0–15.0)
MCH: 27.1 pg (ref 26.0–34.0)
MCHC: 32.5 g/dL (ref 30.0–36.0)
MCV: 83.3 fL (ref 78.0–100.0)
PLATELETS: 322 10*3/uL (ref 150–400)
RBC: 4.43 MIL/uL (ref 3.87–5.11)
RDW: 14.3 % (ref 11.5–15.5)
WBC: 5.1 10*3/uL (ref 4.0–10.5)

## 2015-11-25 LAB — URINALYSIS, ROUTINE W REFLEX MICROSCOPIC
BILIRUBIN URINE: NEGATIVE
Glucose, UA: NEGATIVE mg/dL
Ketones, ur: NEGATIVE mg/dL
Leukocytes, UA: NEGATIVE
NITRITE: NEGATIVE
PH: 6.5 (ref 5.0–8.0)
Protein, ur: NEGATIVE mg/dL
SPECIFIC GRAVITY, URINE: 1.02 (ref 1.005–1.030)

## 2015-11-25 MED ORDER — MEGESTROL ACETATE 40 MG PO TABS: 80.0000 mg | ORAL_TABLET | Freq: Three times a day (TID) | ORAL | 1 refills | Status: DC

## 2015-11-25 NOTE — MAU Note (Signed)
Was just here Friday.  Same thing with the bleeding.  It is really stressing her out, and she is getting depressed.

## 2015-11-25 NOTE — Discharge Instructions (Signed)

## 2015-11-25 NOTE — MAU Provider Note (Signed)
History   Briana Wade is a 45 year old [place] [phone] here with complaints of vaginal bleeding and very heavy period. Was seen on 11/22/2015 for the same reason.   CSN: [id number]  Arrival date and time: 11/25/15 1327   First Provider Initiated Contact with Patient 11/25/15 1539      Chief Complaint  Patient presents with  . Vaginal Bleeding   Vaginal Bleeding  The patient's primary symptoms include vaginal bleeding. The patient's pertinent negatives include no genital itching, genital lesions, genital odor, genital rash, missed menses or pelvic pain. This is a chronic problem. The current episode started more than 1 month ago. The problem occurs constantly. The problem has been unchanged. The pain is mild. The problem affects both sides. She is not pregnant. Pertinent negatives include no anorexia. The vaginal discharge was bloody. The vaginal bleeding is typical of menses. She has been passing clots. She has not been passing tissue. She has tried NSAIDs for the symptoms. The treatment provided no relief. Her past medical history is significant for a gynecological surgery and an STD.    OB History    Gravida Para Term Preterm AB Living   7 3 3   4 3    SAB TAB Ectopic Multiple Live Births   1 3            Past Medical History:  Diagnosis Date  . Abdominal pain, unspecified site   . Allergy   . Anemia   . Asthma   . Chlamydia   . Diverticulitis   . Esophageal reflux   . Hiatal hernia   . Hypertension   . IBS (irritable bowel syndrome)   . Lactose intolerance   . Migraine   . Other constipation   . Personal history of colonic polyps 09/12/2010   tubular adenoma  . Trichimoniasis   . Tubular adenoma of colon   . Umbilical hernia   . Uterine fibroid     Past Surgical History:  Procedure Laterality Date  . APPENDECTOMY    . APPENDECTOMY    . DILATION AND CURETTAGE OF UTERUS     x2 for EAB  . TUBAL LIGATION      Family History  Problem Relation Age of Onset  .  Hypertension Mother   . Cancer Mother     not sure of type  . Colon cancer Neg Hx   . Esophageal cancer Neg Hx   . Rectal cancer Neg Hx   . Stomach cancer Neg Hx     Social History  Substance Use Topics  . Smoking status: Never Smoker  . Smokeless tobacco: Never Used  . Alcohol use Yes     Comment: occasionally    Allergies: No Known Allergies  Prescriptions Prior to Admission  Medication Sig Dispense Refill Last Dose  . albuterol (PROVENTIL HFA;VENTOLIN HFA) 108 (90 BASE) MCG/ACT inhaler Inhale 1-2 puffs into the lungs every 6 (six) hours as needed for wheezing or shortness of breath. Reported on 04/05/2015   Past Month at Unknown time  . cyclobenzaprine (FLEXERIL) 5 MG tablet Take 1 tablet (5 mg total) by mouth 2 (two) times daily as needed for muscle spasms. 10 tablet 0 Past Month at Unknown time  . ibuprofen (ADVIL,MOTRIN) 800 MG tablet Take 1 tablet (800 mg total) by mouth 3 (three) times daily. 15 tablet 0 Past Month at Unknown time  . losartan (COZAAR) 50 MG tablet Take 50 mg by mouth daily.  1 11/22/2015 at Unknown time  .  megestrol (MEGACE) 40 MG tablet Take 2 tablets (80 mg total) by mouth 2 (two) times daily. 160 tablet 1   . montelukast (SINGULAIR) 10 MG tablet Take 10 mg by mouth daily as needed (allergies).   2 Past Month at Unknown time  . pantoprazole (PROTONIX) 40 MG tablet Take 1 tablet (40 mg total) by mouth daily. (Patient taking differently: Take 40 mg by mouth daily as needed (heartburn). ) 30 tablet 6 Past Month at Unknown time  . QVAR 40 MCG/ACT inhaler Inhale 2 puffs into the lungs 2 (two) times daily as needed (shortness of breath).   3 Past Month at Unknown time  . sucralfate (CARAFATE) 1 GM/10ML suspension Take 10 mLs (1 g total) by mouth 4 (four) times daily -  with meals and at bedtime. (Patient taking differently: Take 1 g by mouth 4 (four) times daily as needed (stomach pain). ) 420 mL 0 Past Month at Unknown time    Review of Systems  Constitutional:  Negative.   HENT: Negative.   Eyes: Negative.   Respiratory: Negative.   Cardiovascular: Negative.   Gastrointestinal: Negative.  Negative for anorexia.  Genitourinary: Positive for vaginal bleeding. Negative for missed menses and pelvic pain.  Musculoskeletal: Negative.   Skin: Negative.   Neurological: Negative.   Endo/Heme/Allergies: Negative.   Psychiatric/Behavioral: Negative.    Physical Exam   Blood pressure 133/88, pulse 76, temperature 98.2 F (36.8 C), temperature source Oral, resp. rate 18, weight 93.2 kg (205 lb 6.4 oz), last menstrual period 11/04/2015.  Physical Exam  Constitutional: She is oriented to person, place, and time. She appears well-developed and well-nourished.  Neck: Normal range of motion.  Cardiovascular: Normal rate and regular rhythm.   Respiratory: Effort normal and breath sounds normal.  GI: Soft. Bowel sounds are normal.  Neurological: She is oriented to person, place, and time.    MAU Course  Procedures  MDM -CBC today  Assessment and Plan   Patient has clinic appointment on 11/1. Recommended that she increase her Megace to 80 mg three times per day and keep her appointment on 11/1. Dr. Nehemiah Settle agrees with plan of care.  Briana Wade CNM 11/25/2015, 4:20 PM

## 2015-11-25 NOTE — Telephone Encounter (Signed)
Patient called the MAU because she was told that someone was going to call her about her bleeding. It was not clear to this provider who was supposed to call her. Her bleeding has not been helped since changing the medication.  She is upset because she is still bleeding and wants it taken care of in the next 24 hours. I suggested that she call the [facility] to see if they had any openings this afternoon or tomorrow. If she is concerned about the bleeding she is to come into the MAU for evaluation.

## 2015-11-27 ENCOUNTER — Encounter: Payer: Self-pay | Admitting: Obstetrics and Gynecology

## 2015-11-27 ENCOUNTER — Ambulatory Visit (INDEPENDENT_AMBULATORY_CARE_PROVIDER_SITE_OTHER): Payer: Medicaid Other | Admitting: Obstetrics and Gynecology

## 2015-11-27 VITALS — BP 138/94 | HR 98 | Wt 204.8 lb

## 2015-11-27 DIAGNOSIS — N939 Abnormal uterine and vaginal bleeding, unspecified: Secondary | ICD-10-CM | POA: Diagnosis present

## 2015-11-27 NOTE — Progress Notes (Signed)
Obstetrics and Gynecology Visit Return Patient Evaluation  Appointment Date: 11/27/2015  OBGYN Clinic: [facility]  Primary Care Provider: Junie Panning  Chief Complaint:  Follow up AUB  Interval History: Patient last seen by me on 10/9 and had a negative pap and endometrial biopsy.  She has been seen several times for AUB and her use of PO medications appears to have been sporadic and spotty, with contradictory notes. Per her 10/27 MAU note, she states that she wasn't taking the Aygestin correctly so went back to the Megace that she was using in the past and that she never filed her Marshell Levan that was phone in for her on 10/25 by me. At that MAU visit, they increased her megace to 80 bid.   Today, she states she still has 2-3 pads of non heavy bleeding and occasional cramping. She states that she did take the lysteda for 3 days but then stopped and startedon the megace.   No current pain, s/s of anemia, fevers, chills.   History of Present Illness: Briana Wade is a 45 y.o. African-American [id number] (LMP: unknown), seen for the above chief complaint. Her past medical history is significant for HTN, h/o BTL, BMI 35, HTN, migraines, h/o sexual infections.   Patient seen in the MAU on 9/5 for AUB for several months, which was non painful. Hct 35 and negative wet prep, GC/CT and UPT and u/s showed 10.7 x 7.2 x 7.6cm uterus with ES 55mm and multiple fibroids, notably a 1.6cm submucosal fibroid. Patient discharged to home on megace 40 bid, which she last took two days ago when she ran out. Patient states her bleeding stopped a few days after being seen in the MAU. Prior to going to the MAU, she had regular, qmonth periods that lasted for about 4-5d and not really painful or heavy.   No current VB and no discharge, itching, nausea, vomiting, chest pain, sob, fevers, chills, abdominal pain, dysuria, hematuria and no climateric s/s.    No breast s/s, fevers, chills,  chest pain, SOB, nausea, vomiting, abdominal pain, dysuria, hematuria, vaginal itching, dyspareunia, SUI or OAB, diarrhea, constipation, blood in BMs  Review of Systems: Her 12 point review of systems is negative or as noted in the History of Present Illness.   Past Medical History:  Past Medical History:  Diagnosis Date  . Abdominal pain, unspecified site   . Allergy   . Anemia   . Asthma   . Chlamydia   . Diverticulitis   . Esophageal reflux   . Hiatal hernia   . Hypertension   . IBS (irritable bowel syndrome)   . Lactose intolerance   . Migraine   . Other constipation   . Personal history of colonic polyps 09/12/2010   tubular adenoma  . Trichimoniasis   . Tubular adenoma of colon   . Umbilical hernia   . Uterine fibroid     Past Surgical History:  Past Surgical History:  Procedure Laterality Date  . APPENDECTOMY    . APPENDECTOMY    . DILATION AND CURETTAGE OF UTERUS     x2 for EAB  . TUBAL LIGATION      Past Obstetrical History:  OB History  Gravida Para Term Preterm AB Living  7 3 3   4 3   SAB TAB Ectopic Multiple Live Births  1 3          # Outcome Date GA Lbr Len/2nd Weight Sex Delivery Anes PTL Lv  7 TAB  6 TAB           5 TAB           4 SAB           3 Term           2 Term           1 Term             Obstetric Comments  svd x 3    Past Gynecological History: As per HPI. No. history of abnormal pap smears (last pap smear: pt unsure. 2012 negative pap smear in the system) Yes.   history of STIs.    Social History:  Social History   Social History  . Marital status: Single    Spouse name: N/A  . Number of children: 3  . Years of education: N/A   Occupational History  . Unemployed    Social History Main Topics  . Smoking status: Never Smoker  . Smokeless tobacco: Never Used  . Alcohol use Yes     Comment: occasionally  . Drug use: No  . Sexual activity: Yes    Birth control/ protection: Surgical   Other Topics  Concern  . Not on file   Social History Narrative  . No narrative on file    Family History:  Family History  Problem Relation Age of Onset  . Hypertension Mother   . Cancer Mother     not sure of type  . Colon cancer Neg Hx   . Esophageal cancer Neg Hx   . Rectal cancer Neg Hx   . Stomach cancer Neg Hx    She denies any female cancers, bleeding or blood clotting disorders.   Health Maintenance:  Mammogram Yes , which was done in 2017 and negative  Medications Briana Wade had no medications administered during this visit. Current Outpatient Prescriptions  Medication Sig Dispense Refill  . albuterol (PROVENTIL HFA;VENTOLIN HFA) 108 (90 BASE) MCG/ACT inhaler Inhale 1-2 puffs into the lungs every 6 (six) hours as needed for wheezing or shortness of breath.     . losartan (COZAAR) 50 MG tablet Take 50 mg by mouth daily.  1  . QVAR 40 MCG/ACT inhaler Inhale 2 puffs into the lungs 2 (two) times daily as needed (for shortness of breath).   3  . megestrol (MEGACE) 40 MG tablet Take 2 tablets (80 mg total) by mouth 3 (three) times daily. (Patient not taking: Reported on 11/27/2015) 160 tablet 1   No current facility-administered medications for this visit.     Allergies Review of patient's allergies indicates no known allergies.   Physical Exam:  BP (!) 138/94   Pulse 98   Wt 204 lb 12.8 oz (92.9 kg)   LMP 11/04/2015   BMI 34.08 kg/m  Body mass index is 34.08 kg/m. General appearance: Well nourished, well developed female in no acute distress.   Laboratory: see prior note  Radiology: no new imaging  Assessment: AUB. Pt stable  Plan:  D/w her re: treatment options and could try LNG IUD next or surgical options. D/w pt again re: meds vs surgical, with LNG IUD preferred for medical option. With ablation, d/w her that mirena and ablation appear to have equivalent long term results with goal for regular periods and not necessarily amenorrhea with one being more immediate.  Risks of ablation d/w her, including possible difficulty dx malignancy in the future, recurrence risk and risk of injury to surrounding structures, and with  her submucosal fibroid, risk of procedure not working as well although it is less than 2cm. Also d/w her that procedure just may not work and may need hyst for bleeding control anyways. I told her that'd I'd recommend hyst (would need to do an exam but high chance of doing it vaginally) given her recurrence with the medications and desire for definitive management; r/b d/w her and likely be able to do it MIS.   Pt would like to do ablation; will send request to office for ablation procedure scheduling.   RTC PRN  Durene Romans MD Attending Center for [employer])

## 2015-12-04 ENCOUNTER — Ambulatory Visit (INDEPENDENT_AMBULATORY_CARE_PROVIDER_SITE_OTHER): Payer: Medicaid Other | Admitting: Clinical

## 2015-12-04 ENCOUNTER — Encounter (HOSPITAL_COMMUNITY): Payer: Self-pay | Admitting: *Deleted

## 2015-12-04 ENCOUNTER — Encounter: Payer: Self-pay | Admitting: Obstetrics & Gynecology

## 2015-12-04 ENCOUNTER — Ambulatory Visit (INDEPENDENT_AMBULATORY_CARE_PROVIDER_SITE_OTHER): Payer: Medicaid Other | Admitting: Obstetrics & Gynecology

## 2015-12-04 VITALS — BP 128/92 | HR 82 | Ht 61.0 in | Wt 209.0 lb

## 2015-12-04 DIAGNOSIS — F4323 Adjustment disorder with mixed anxiety and depressed mood: Secondary | ICD-10-CM | POA: Diagnosis not present

## 2015-12-04 DIAGNOSIS — N939 Abnormal uterine and vaginal bleeding, unspecified: Secondary | ICD-10-CM

## 2015-12-04 NOTE — Patient Instructions (Signed)
Endometrial Ablation °Endometrial ablation removes the lining of the uterus (endometrium). It is usually a same-day, outpatient treatment. Ablation helps avoid major surgery, such as surgery to remove the cervix and uterus (hysterectomy). After endometrial ablation, you will have little or no menstrual bleeding and may not be able to have children. However, if you are premenopausal, you will need to use a reliable method of birth control following the procedure because of the small chance that pregnancy can occur. °There are different reasons to have this procedure. These reasons include: °· Heavy periods. °· Bleeding that is causing anemia. °· Irregular bleeding. °· Bleeding fibroids on the lining inside the uterus if they are smaller than 3 centimeters. °This procedure may not be possible for you if:  °· You want to have children in the future.   °· You have severe cramps with your menstrual period.   °· You have precancerous or cancerous cells in your uterus.   °· You were recently pregnant.   °· You have gone through menopause.   °· You have had major surgery on your uterus, resulting in thinning of the uterine wall. Surgeries may include: °¨ The removal of one or more uterine fibroids (myomectomy). °¨ A cesarean section with a classic (vertical) incision on your uterus. Ask your health care provider what type of cesarean you had. Sometimes the scar on your skin is different than the scar on your uterus. °Even if you have had surgery on your uterus, certain types of ablation may still be safe for you. Talk with your health care provider. °LET YOUR HEALTH CARE PROVIDER KNOW ABOUT: °· Any allergies you have. °· All medicines you are taking, including vitamins, herbs, eye drops, creams, and over-the-counter medicines. °· Previous problems you or members of your family have had with the use of anesthetics. °· Any blood disorders you have. °· Previous surgeries you have had. °· Medical conditions you have. °RISKS AND  COMPLICATIONS  °Generally, this is a safe procedure. However, as with any procedure, complications can occur. Possible complications include: °· Perforation of the uterus. °· Bleeding. °· Infection of the uterus, bladder, or vagina. °· Injury to surrounding organs. °· An air bubble to the lung (air embolus). °· Pregnancy following the procedure. °· Failure of the procedure to help the problem, requiring hysterectomy. °· Decreased ability to diagnose cancer in the lining of the uterus. °BEFORE THE PROCEDURE °· The lining of the uterus must be tested to make sure there is no pre-cancerous or cancer cells present. °· An ultrasound may be performed to look at the size of the uterus and to check for abnormalities. °· Medicines may be given to thin the lining of the uterus. °PROCEDURE  °During the procedure, your health care provider will use a tool called a resectoscope to help see inside your uterus. There are different ways to remove the lining of your uterus.  °· Radiofrequency - This method uses a radiofrequency-alternating electric current to remove the lining of the uterus. °· Cryotherapy - This method uses extreme cold to freeze the lining of the uterus. °· Heated-Free Liquid - This method uses heated salt (saline) solution to remove the lining of the uterus. °· Microwave - This method uses high-energy microwaves to heat up the lining of the uterus to remove it. °· Thermal balloon - This method involves inserting a catheter with a balloon tip into the uterus. The balloon tip is filled with heated fluid to remove the lining of the uterus. °AFTER THE PROCEDURE  °After your procedure, do   not have sexual intercourse or insert anything into your vagina until permitted by your health care provider. After the procedure, you may experience: °· Cramps. °· Vaginal discharge. °· Frequent urination. °  °This information is not intended to replace advice given to you by your health care provider. Make sure you discuss any  questions you have with your health care provider. °  °Document Released: 11/22/2003 Document Revised: 10/03/2014 Document Reviewed: 06/15/2012 °Elsevier Interactive Patient Education ©2016 Elsevier Inc. ° °

## 2015-12-04 NOTE — Progress Notes (Signed)
Pt has been seen by Dr. Ilda Basset.  She reports multiple days of bleeding and was feeling that no one was doing anything.  She has been seen in the office and the MAU.  The request to schedule her procedure was in but, the pt had not received the date.  I explained to her the process and have given her the date and time of her surgery.  I have also encouraged pt to take the meds that she was prescribed as she has not been taking them.  Pt was also seen by the behaviorist today for consultation.    BP (!) 128/92   Pulse 82   Ht 5\' 1"  (1.549 m)   Wt 209 lb (94.8 kg)   LMP 11/04/2015   BMI 39.49 kg/m  BP (!) 128/92   Pulse 82   Ht 5\' 1"  (1.549 m)   Wt 209 lb (94.8 kg)   LMP 11/04/2015   BMI 39.49 kg/m  CONSTITUTIONAL: Well-developed, well-nourished female in no acute distress.  HENT:  Normocephalic, atraumatic EYES: Conjunctivae and EOM are normal. No scleral icterus.  NECK: Normal range of motion SKIN: Skin is warm and dry. No rash noted. Not diaphoretic.No pallor. [place]: Alert and oriented to person, place, and time. Normal coordination.   A/P AUB  Patient desires surgical management with hysteroscopy with endometrial ablation with Dr. Ilda Basset Dec 17, 2015.  The risks of surgery were discussed in detail with the patient including but not limited to: bleeding which may require transfusion or reoperation; infection which may require prolonged hospitalization or re-hospitalization and antibiotic therapy; injury to bowel, bladder, ureters and major vessels or other surrounding organs; need for additional procedures including laparotomy; thromboembolic phenomenon, incisional problems and other postoperative or anesthesia complications.  Patient was told that the likelihood that her condition and symptoms will be treated effectively with this surgical management was very high; the postoperative expectations were also discussed in detail. The patient also understands the alternative treatment  options which were discussed in full. All questions were answered.    Total face-to-face time with patient was 15 min.  Greater than 50% was spent in counseling and coordination of care with the patient. We discussed her frustrations with AUB, medication recommendation, the surgical procedure itself.   [name], M.D., [name]

## 2015-12-04 NOTE — BH Specialist Note (Signed)
Session Start time: 1:10   End Time: 1:35 Total Time:  25 minutes Type of Service: [place] Interpreter: No.   Interpreter Name & Language: n/a # [facility] Visits July 2017-June 2018: 1st   SUBJECTIVE: Briana Wade is a 45 y.o. female  Pt. was referred by Lavonia Drafts, MD for:  anxiety and depression. Pt. reports the following symptoms/concerns: Pt states that her primary concern is being tired and stressed over excessive bleeding, and that it is helpful for her to talk to someone about how she is feeling, and is open to learning strategy to cope with both pain and stress. Duration of problem:  Less than 6 months Severity: severe Previous treatment: none   OBJECTIVE: Mood: Appropriate & Affect: Appropriate Risk of harm to self or others: No known risk of harm to self or others Assessments administered: PHQ9: 21/ GAD7: 12  LIFE CONTEXT:  Family & Social: Lives with husband and two sons(14yo and 20's), has 48yo son as well School/Work: unknown Self-Care: Pain interfering with sleep  Life changes: Increase in bleeding/pain What is important to pt/family (values): Family   GOALS ADDRESSED:  -Alleviate symptoms of anxiety and depression  INTERVENTIONS: Strength-based and Meditation: CALM relaxation breathing exercise   ASSESSMENT:  Pt currently experiencing Adjustment disorder with mixed anxious and depressed mood.  Pt may benefit from psychoeducation and brief therapeutic interventions regarding coping with symptoms of anxiety and depression.     PLAN: 1. F/U with behavioral health clinician: as needed 2. Behavioral Health meds: none 3. Behavioral recommendations:  - Practice daily CALM relaxation breathing exercise -Read educational material regarding coping with symptoms of anxiety and depression -Consider apps for additional self-care  4. Referral: Brief Counseling/Psychotherapy and Psychoeducation 5. From scale of 1-10, how likely  are you to follow plan: [place]:   Warm Hand Off Completed.        Depression screen [facility] 2/9 12/04/2015 11/27/2015  Decreased Interest 3 3  Down, Depressed, Hopeless 3 3  PHQ - 2 Score 6 6  Altered sleeping 3 2  Tired, decreased energy 3 3  Change in appetite 2 2  Feeling bad or failure about yourself  2 2  Trouble concentrating 3 1  Moving slowly or fidgety/restless 2 2  Suicidal thoughts 0 0  PHQ-9 Score 21 18   GAD 7 : Generalized Anxiety Score 12/04/2015 11/27/2015  Nervous, Anxious, on Edge 0 0  Control/stop worrying 3 1  Worry too much - different things 1 2  Trouble relaxing 2 2  Restless 2 1  Easily annoyed or irritable 3 1  Afraid - awful might happen 1 0  Total GAD 7 Score 12 7

## 2015-12-10 ENCOUNTER — Other Ambulatory Visit: Payer: Self-pay | Admitting: Obstetrics and Gynecology

## 2015-12-11 NOTE — Patient Instructions (Signed)
Your procedure is scheduled on:  Tuesday, Nov. 21, 2017  Enter through the [employer] [facility] at:  11:30 AM  Pick up the phone at the desk and dial [phone].  Call this number if you have problems the morning of surgery: [phone].  Remember: Do NOT eat food:  After Midnight Monday  Do NOT drink clear liquids after:  9:00 AM day of surgery  Take these medicines the morning of surgery with a SIP OF WATER:  Losartan  Stop ALL herbal medications at this time   Do NOT wear jewelry (body piercing), metal hair clips/bobby pins, make-up, or nail polish. Do NOT wear lotions, powders, or perfumes.  You may wear deodorant. Do NOT shave for 48 hours prior to surgery. Do NOT bring valuables to the hospital. Contacts, dentures, or bridgework may not be worn into surgery.  Have a responsible adult drive you home and stay with you for 24 hours after your procedure

## 2015-12-12 ENCOUNTER — Encounter (HOSPITAL_COMMUNITY): Payer: Self-pay

## 2015-12-12 ENCOUNTER — Encounter (HOSPITAL_COMMUNITY)
Admission: RE | Admit: 2015-12-12 | Discharge: 2015-12-12 | Disposition: A | Discharge status: Home / Self Care | Payer: Medicaid Other | Source: Ambulatory Visit | Attending: Obstetrics and Gynecology | Admitting: Obstetrics and Gynecology

## 2015-12-12 ENCOUNTER — Other Ambulatory Visit: Payer: Self-pay

## 2015-12-12 DIAGNOSIS — I10 Essential (primary) hypertension: Secondary | ICD-10-CM | POA: Insufficient documentation

## 2015-12-12 DIAGNOSIS — Z01812 Encounter for preprocedural laboratory examination: Secondary | ICD-10-CM | POA: Insufficient documentation

## 2015-12-12 DIAGNOSIS — N939 Abnormal uterine and vaginal bleeding, unspecified: Secondary | ICD-10-CM | POA: Insufficient documentation

## 2015-12-12 DIAGNOSIS — Z01810 Encounter for preprocedural cardiovascular examination: Secondary | ICD-10-CM | POA: Insufficient documentation

## 2015-12-12 HISTORY — DX: Insomnia, unspecified: G47.00

## 2015-12-12 LAB — CBC
HEMATOCRIT: 36.8 % (ref 36.0–46.0)
Hemoglobin: 12 g/dL (ref 12.0–15.0)
MCH: 26.9 pg (ref 26.0–34.0)
MCHC: 32.6 g/dL (ref 30.0–36.0)
MCV: 82.5 fL (ref 78.0–100.0)
Platelets: 321 10*3/uL (ref 150–400)
RBC: 4.46 MIL/uL (ref 3.87–5.11)
RDW: 14.4 % (ref 11.5–15.5)
WBC: 6.8 10*3/uL (ref 4.0–10.5)

## 2015-12-12 LAB — BASIC METABOLIC PANEL
ANION GAP: 7 (ref 5–15)
BUN: 9 mg/dL (ref 6–20)
CALCIUM: 9.2 mg/dL (ref 8.9–10.3)
CO2: 23 mmol/L (ref 22–32)
CREATININE: 0.78 mg/dL (ref 0.44–1.00)
Chloride: 107 mmol/L (ref 101–111)
GFR calc Af Amer: >60 mL/min (ref >60)
GFR calc non Af Amer: >60 mL/min (ref >60)
GLUCOSE: 85 mg/dL (ref 65–99)
Potassium: 3.9 mmol/L (ref 3.5–5.1)
Sodium: 137 mmol/L (ref 135–145)

## 2015-12-12 NOTE — Pre-Procedure Instructions (Signed)
Dr. Ermalene Postin informed of elevated blood pressure, suggests patient monitors blood pressures and follow up with primary care provider, no additional orders at this time.

## 2015-12-16 ENCOUNTER — Telehealth: Payer: Self-pay | Admitting: Obstetrics and Gynecology

## 2015-12-16 ENCOUNTER — Telehealth: Payer: Self-pay | Admitting: General Practice

## 2015-12-16 NOTE — Telephone Encounter (Signed)
GYN Telephone Note 12/16/2015 1107am Patient called @ [phone] in regards to inbasket message from clinic: Hi! This patient is scheduled for surgery with you on 11/21. She wanted to see if you could also do surgery for her fibroids at that time. She can be reached at [phone] if you are able to let her know. I will be out of the office next week so if you need to reply to the nurses please sent to clinical pool.   I called her and d/w her the situation. On her u/s it looks like she has some small fibroids but given her situation, it's like that if any are causing her AUB, it's the <2cm submucosal fibroid. I told her that unless she has a hysterectomy, which removes everything and I recommended to her previously but she wanted to try hysteroscopy first, that I'm going to do one of two procedures tomorrow. If there is a large submucosal fibroid (larger than what I'm seeing on u/s) I'd just do a hysteroscopic myomectomy and no ablation procedure since I don't recommend doing both at the same time. If there are no submucosal fibroids or a <2cm submucosal fibroid then I'd move forward the ablation, given good success rates for small submucosal fibroids in those types of patients.  Patient understands and still would like to move forward with surgery tomorrow  Aletha Halim, Brooke Bonito MD Attending Center for [place] [facility])

## 2015-12-16 NOTE — Telephone Encounter (Signed)
Patient called into the front office stating she started to think about her surgery & wanting the fibroid tumors removed. Patient states she doesn't know if he can do that tomorrow and if so she doesn't want it done if she can't go home the same day. Patient is also wondering if he will do both if she should just wait to have them done at the same time. Told patient I do not know the answers to her questions and will have to call her back this afternoon when I speak with Dr Ilda Basset. Patient verbalized understanding & had no questions at this time.

## 2015-12-17 ENCOUNTER — Ambulatory Visit (HOSPITAL_COMMUNITY)
Admission: RE | Admit: 2015-12-17 | Discharge: 2015-12-17 | Disposition: A | Discharge status: Home / Self Care | Payer: Medicaid Other | Source: Ambulatory Visit | Attending: Obstetrics and Gynecology | Admitting: Obstetrics and Gynecology

## 2015-12-17 ENCOUNTER — Encounter (HOSPITAL_COMMUNITY)
Admission: RE | Disposition: A | Discharge status: Home / Self Care | Payer: Self-pay | Source: Ambulatory Visit | Attending: Obstetrics and Gynecology

## 2015-12-17 ENCOUNTER — Ambulatory Visit (HOSPITAL_COMMUNITY): Payer: Medicaid Other | Admitting: Certified Registered Nurse Anesthetist

## 2015-12-17 ENCOUNTER — Encounter (HOSPITAL_COMMUNITY): Payer: Self-pay

## 2015-12-17 DIAGNOSIS — Z6835 Body mass index (BMI) 35.0-35.9, adult: Secondary | ICD-10-CM | POA: Diagnosis not present

## 2015-12-17 DIAGNOSIS — D251 Intramural leiomyoma of uterus: Secondary | ICD-10-CM | POA: Diagnosis not present

## 2015-12-17 DIAGNOSIS — D250 Submucous leiomyoma of uterus: Secondary | ICD-10-CM

## 2015-12-17 DIAGNOSIS — I10 Essential (primary) hypertension: Secondary | ICD-10-CM | POA: Diagnosis not present

## 2015-12-17 DIAGNOSIS — N939 Abnormal uterine and vaginal bleeding, unspecified: Secondary | ICD-10-CM

## 2015-12-17 DIAGNOSIS — D252 Subserosal leiomyoma of uterus: Secondary | ICD-10-CM

## 2015-12-17 HISTORY — PX: HYSTEROSCOPY WITH NOVASURE: SHX5574

## 2015-12-17 LAB — PREGNANCY, URINE: PREG TEST UR: NEGATIVE

## 2015-12-17 SURGERY — HYSTEROSCOPY WITH NOVASURE
Anesthesia: General | Site: Vagina

## 2015-12-17 MED ORDER — IBUPROFEN 200 MG PO TABS: 200.0000 mg | ORAL_TABLET | Freq: Four times a day (QID) | ORAL | 0 refills | Status: DC | PRN

## 2015-12-17 MED ORDER — PROPOFOL 10 MG/ML IV BOLUS
INTRAVENOUS | Status: AC
  Filled 2015-12-17: qty 20

## 2015-12-17 MED ORDER — OXYCODONE-ACETAMINOPHEN 5-325 MG PO TABS: 1.0000 | ORAL_TABLET | Freq: Four times a day (QID) | ORAL | 0 refills | Status: DC | PRN

## 2015-12-17 MED ORDER — FENTANYL CITRATE (PF) 100 MCG/2ML IJ SOLN
INTRAMUSCULAR | Status: DC | PRN
  Administered 2015-12-17: 25 ug via INTRAVENOUS

## 2015-12-17 MED ORDER — DEXAMETHASONE SODIUM PHOSPHATE 4 MG/ML IJ SOLN
INTRAMUSCULAR | Status: AC
  Filled 2015-12-17: qty 1

## 2015-12-17 MED ORDER — LIDOCAINE HCL 1 % IJ SOLN
INTRAMUSCULAR | Status: AC
  Filled 2015-12-17: qty 20

## 2015-12-17 MED ORDER — ONDANSETRON HCL 4 MG/2ML IJ SOLN
INTRAMUSCULAR | Status: DC | PRN
  Administered 2015-12-17: 4 mg via INTRAVENOUS

## 2015-12-17 MED ORDER — DEXAMETHASONE SODIUM PHOSPHATE 10 MG/ML IJ SOLN
INTRAMUSCULAR | Status: DC | PRN
  Administered 2015-12-17: 4 mg via INTRAVENOUS

## 2015-12-17 MED ORDER — SODIUM CHLORIDE 0.9 % IR SOLN
Status: DC | PRN
  Administered 2015-12-17: 3000 mL

## 2015-12-17 MED ORDER — MIDAZOLAM HCL 2 MG/2ML IJ SOLN
INTRAMUSCULAR | Status: AC
Start: 2015-12-17 — End: 2015-12-17
  Filled 2015-12-17: qty 2

## 2015-12-17 MED ORDER — FENTANYL CITRATE (PF) 100 MCG/2ML IJ SOLN
25.0000 ug | INTRAMUSCULAR | Status: DC | PRN
  Administered 2015-12-17 (×3): 50 ug via INTRAVENOUS

## 2015-12-17 MED ORDER — LIDOCAINE HCL (CARDIAC) 20 MG/ML IV SOLN
INTRAVENOUS | Status: DC | PRN
  Administered 2015-12-17: 100 mg via INTRAVENOUS

## 2015-12-17 MED ORDER — ONDANSETRON HCL 4 MG/2ML IJ SOLN
INTRAMUSCULAR | Status: AC
  Filled 2015-12-17: qty 2

## 2015-12-17 MED ORDER — FENTANYL CITRATE (PF) 100 MCG/2ML IJ SOLN
INTRAMUSCULAR | Status: AC
  Filled 2015-12-17: qty 2

## 2015-12-17 MED ORDER — LIDOCAINE HCL (PF) 1 % IJ SOLN
INTRAMUSCULAR | Status: DC | PRN
  Administered 2015-12-17: 20 mL

## 2015-12-17 MED ORDER — LIDOCAINE HCL (CARDIAC) 20 MG/ML IV SOLN
INTRAVENOUS | Status: AC
  Filled 2015-12-17: qty 5

## 2015-12-17 MED ORDER — KETOROLAC TROMETHAMINE 30 MG/ML IJ SOLN: 30.0000 mg | Freq: Once | INTRAMUSCULAR | Status: DC

## 2015-12-17 MED ORDER — ONDANSETRON HCL 4 MG/2ML IJ SOLN
4.0000 mg | Freq: Once | INTRAMUSCULAR | Status: DC | PRN
Start: 2015-12-17 — End: 2015-12-17

## 2015-12-17 MED ORDER — MIDAZOLAM HCL 2 MG/2ML IJ SOLN
INTRAMUSCULAR | Status: DC | PRN
  Administered 2015-12-17 (×2): 1 mg via INTRAVENOUS

## 2015-12-17 MED ORDER — MEPERIDINE HCL 25 MG/ML IJ SOLN: 6.2500 mg | INTRAMUSCULAR | Status: DC | PRN

## 2015-12-17 MED ORDER — LACTATED RINGERS IV SOLN
INTRAVENOUS | Status: DC
  Administered 2015-12-17: 12:00:00 via INTRAVENOUS
  Administered 2015-12-17: 1000 mL via INTRAVENOUS

## 2015-12-17 MED ORDER — SCOPOLAMINE 1 MG/3DAYS TD PT72: 1.0000 | MEDICATED_PATCH | Freq: Once | TRANSDERMAL | Status: DC

## 2015-12-17 MED ORDER — PROPOFOL 10 MG/ML IV BOLUS
INTRAVENOUS | Status: DC | PRN
  Administered 2015-12-17: 200 mg via INTRAVENOUS

## 2015-12-17 SURGICAL SUPPLY — 17 items
ABLATOR ENDOMETRIAL BIPOLAR (ABLATOR) ×3 IMPLANT
CANISTER SUCT 3000ML (MISCELLANEOUS) ×3 IMPLANT
CATH ROBINSON RED A/P 16FR (CATHETERS) ×3 IMPLANT
CLOTH BEACON ORANGE TIMEOUT ST (SAFETY) ×3 IMPLANT
CONTAINER PREFILL 10% NBF 60ML (FORM) ×3 IMPLANT
GLOVE BIO SURGEON STRL SZ7 (GLOVE) ×3 IMPLANT
GLOVE BIOGEL PI IND STRL 7.0 (GLOVE) ×1 IMPLANT
GLOVE BIOGEL PI IND STRL 7.5 (GLOVE) ×1 IMPLANT
GLOVE BIOGEL PI INDICATOR 7.0 (GLOVE) ×2
GLOVE BIOGEL PI INDICATOR 7.5 (GLOVE) ×2
GOWN STRL REUS W/TWL LRG LVL3 (GOWN DISPOSABLE) ×3 IMPLANT
PACK VAGINAL MINOR WOMEN LF (CUSTOM PROCEDURE TRAY) ×3 IMPLANT
PAD OB MATERNITY 4.3X12.25 (PERSONAL CARE ITEMS) ×3 IMPLANT
TOWEL OR 17X24 6PK STRL BLUE (TOWEL DISPOSABLE) ×3 IMPLANT
TUBING AQUILEX INFLOW (TUBING) ×3 IMPLANT
TUBING AQUILEX OUTFLOW (TUBING) ×3 IMPLANT
WATER STERILE IRR 1000ML POUR (IV SOLUTION) ×3 IMPLANT

## 2015-12-17 NOTE — Transfer of Care (Signed)
Immediate Anesthesia Transfer of Care Note  Patient: Briana Wade  Procedure(s) Performed: Procedure(s): HYSTEROSCOPY WITH NOVASURE (N/A)  Patient Location: PACU  Anesthesia Type:General  Level of Consciousness: awake, alert  and oriented  Airway & Oxygen Therapy: Patient Spontanous Breathing and Patient connected to nasal cannula oxygen  Post-op Assessment: Report given to RN, Post -op Vital signs reviewed and stable and Patient moving all extremities  Post vital signs: Reviewed and stable  Last Vitals:  Vitals:   12/17/15 1101  BP: (!) 136/105  Pulse: 72  Resp: 20  Temp: 36.7 C    Last Pain:  Vitals:   12/17/15 1101  TempSrc: Oral      Patients Stated Pain Goal: 3 ([id number])  Complications: No apparent anesthesia complications

## 2015-12-17 NOTE — Anesthesia Preprocedure Evaluation (Signed)
Anesthesia Evaluation  Patient identified by MRN, date of birth, ID band Patient awake    Reviewed: Allergy & Precautions, NPO status , Patient's Chart, lab work & pertinent test results  Airway Mallampati: I       Dental no notable dental hx.    Pulmonary neg pulmonary ROS,    Pulmonary exam normal        Cardiovascular hypertension, Pt. on medications Normal cardiovascular exam     Neuro/Psych negative psych ROS   GI/Hepatic Neg liver ROS,   Endo/Other  Morbid obesity  Renal/GU negative Renal ROS  negative genitourinary   Musculoskeletal negative musculoskeletal ROS (+)   Abdominal (+) + obese,   Peds negative pediatric ROS (+)  Hematology   Anesthesia Other Findings   Reproductive/Obstetrics negative OB ROS                             Anesthesia Physical Anesthesia Plan  ASA: III  Anesthesia Plan: General   Post-op Pain Management:    Induction: Intravenous  Airway Management Planned: LMA  Additional Equipment:   Intra-op Plan:   Post-operative Plan:   Informed Consent:   Plan Discussed with: CRNA and Surgeon  Anesthesia Plan Comments:         Anesthesia Quick Evaluation

## 2015-12-17 NOTE — Op Note (Signed)
Operative Note   12/17/2015  PRE-OP DIAGNOSIS: Abnormal uterine bleeding (irregular, heavy). Fibroid uterus   POST-OP DIAGNOSIS: Same   SURGEON: Surgeon(s) and Role:    * Aletha Halim, MD - Primary  ASSISTANT: None  PROCEDURE: Procedure(s): HYSTEROSCOPY WITH NOVASURE  ANESTHESIA: Choice and paracervical block  ESTIMATED BLOOD LOSS: 38mL  DRAINS: I/O cath 55mL UOP   TOTAL IV FLUIDS: 76mL crystalloid  SPECIMENS: none  VTE PROPHYLAXIS: SCDs to the bilateral lower extremities  ANTIBIOTICS: not indicated  FLUID DEFICIT: [id number]   COMPLICATIONS: None  DISPOSITION: PACU - hemodynamically stable.  CONDITION: stable   FINDINGS: Exam under anesthesia revealed small, mobile mid plane uterus uterus with no masses and bilateral adnexa without masses or fullness.  Good decensus. Hysteroscopy revealed a grossly normal appearing uterine cavity with bilateral tubal ostia and normal appearing endocervical canal. Uterine cavity was atrophic and no evidence of submucosal fibroids and cavity appeared normal and not distorted. Uniform ablation noted throughout cavity with slight ablation of the internal os.   PROCEDURE IN DETAIL:  After informed consent was obtained, the patient was taken to the operating room where anesthesia was obtained without difficulty. The patient was positioned in the dorsal lithotomy position in [place].  The patient's bladder was catheterized with an in and out foley catheter.  The patient was examined under anesthesia, with the above noted findings.  The bi-valved speculum was placed inside the patient's vagina, and the the anterior lip of the cervix was seen and grasped with the tenaculum.  A paracervical block was achieved with 62mL 1% lidocaine.  The cervical length measured 4cm and the uterine overall sounded to 9.5cm for a uterine cavity length of 5.5cm; then the cervix was progressively dilated to a 21 French-Pratt dilator.  The hysteroscope was  introduced, with the above noted findings. The hystersocope was removed and the Novasure endometrial ablation done in the usual fashion. Cavity width 3.4cm and power of 103 watts with 70 second ablation time.  Hysteroscope reintroduced with the above noted findings. Excellent hemostasis was noted, and all instruments were removed, after application of silver nitrate to tenaculum sites She was then taken out of dorsal lithotomy.  The patient tolerated the procedure well.  Sponge, lap and instrument counts were correct x2.  The patient was taken to recovery room in excellent condition.  Durene Romans MD Attending Center for [employer] [facility])

## 2015-12-17 NOTE — Discharge Instructions (Addendum)
DISCHARGE INSTRUCTIONS: D&C / D&E The following instructions have been prepared to help you care for yourself upon your return home.   Personal hygiene:  Use sanitary pads for vaginal drainage, not tampons.  Shower the day after your procedure.  NO tub baths, pools or Jacuzzis for 2-3 weeks.  Wipe front to back after using the bathroom.  Activity and limitations:  Do NOT drive or operate any equipment for 24 hours. The effects of anesthesia are still present and drowsiness may result.  Do NOT rest in bed all day.  Walking is encouraged.  Walk up and down stairs slowly.  You may resume your normal activity in one to two days or as indicated by your physician.  Sexual activity: NO intercourse for at least 2 weeks after the procedure, or as indicated by your physician.  Diet: Eat a light meal as desired this evening. You may resume your usual diet tomorrow.  Return to work: You may resume your work activities in one to two days or as indicated by your doctor.  What to expect after your surgery: Expect to have vaginal bleeding/discharge for 2-3 days and spotting for up to 10 days. It is not unusual to have soreness for up to 1-2 weeks. You may have a slight burning sensation when you urinate for the first day. Mild cramps may continue for a couple of days. You may have a regular period in 2-6 weeks.  Call your doctor for any of the following:  Excessive vaginal bleeding, saturating and changing one pad every hour.  Inability to urinate 6 hours after discharge from hospital.  Pain not relieved by pain medication.  Fever of 100.4 F or greater.  Unusual vaginal discharge or odor.   Call for an appointment:    Patients signature: ______________________  Nurses signature ________________________  Support person's signature_______________________     We will discuss your surgery once again in detail at your post-op visit in two to four weeks. If you havent already  done so, please call to make your appointment as soon as possible.  Hysteroscopy Care After These instructions give you information on caring for yourself after your procedure. Your doctor may also give you more specific instructions. Call your doctor if you have any problems or questions after your procedure. HOME CARE  Do not drive for 24 hours.  Wait 1 week before doing any activities that wear you out.  Do not stand for a long time.  Limit stair climbing to once or twice a day.  Rest often.  Continue with your usual diet.  Drink enough fluids to keep your pee (urine) clear or pale yellow.  If you have a hard time pooping (constipation), you may:  Take a medicine to help you go poop (laxative) as told by your doctor.  Eat more fruit and bran.  Drink more fluids.  Take showers, not baths, for as long as told by your doctor.  Do not swim or use a hot tub until your doctor says it is okay.  Have someone with you for 1day after the procedure.  Do not douche, use tampons, or have sex (intercourse) until seen by your doctor  Only take medicines as told by your doctor. Do not take aspirin. It can cause bleeding.  Keep all doctor visits. GET HELP IF:  You have cramps or pain not helped by medicine.  You have new pain in the belly (abdomen).  You have a bad smelling fluid coming from your vagina.  You have a rash.  You have problems with any medicine. GET HELP RIGHT AWAY IF:   You start to bleed more than a regular period.  You have a fever.  You have chest pain.  You have trouble breathing.  You feel dizzy or feel like passing out (fainting).  You pass out.  You have pain in the tops of your shoulders.  You have vaginal bleeding with or without clumps of blood (blood clots). MAKE SURE YOU:  Understand these instructions.  Will watch your condition.  Will get help right away if you are not doing well or get worse. Document Released: 10/22/2007  Document Revised: 01/17/2013 Document Reviewed: 08/11/2012 [facility] Patient Information 2015 [place], [place]. This information is not intended to replace advice given to you by your health care provider. Make sure you discuss any questions you have with your health care provider.

## 2015-12-17 NOTE — H&P (Signed)
See progress note from today

## 2015-12-17 NOTE — Anesthesia Postprocedure Evaluation (Signed)
Anesthesia Post Note  Patient: Briana Wade  Procedure(s) Performed: Procedure(s) (LRB): HYSTEROSCOPY WITH NOVASURE (N/A)  Patient location during evaluation: PACU Anesthesia Type: General Level of consciousness: awake and sedated Pain management: pain level controlled Vital Signs Assessment: post-procedure vital signs reviewed and stable Respiratory status: spontaneous breathing Cardiovascular status: stable Postop Assessment: no signs of nausea or vomiting Anesthetic complications: no     Last Vitals:  Vitals:   12/17/15 1315 12/17/15 1330  BP: (!) 167/105 (!) 150/109  Pulse: 74 75  Resp: 16 12  Temp:      Last Pain:  Vitals:   12/17/15 1101  TempSrc: Oral   Pain Goal: Patients Stated Pain Goal: 3 (12/17/15 1101)               Nilda Simmer

## 2015-12-17 NOTE — H&P (Signed)
Obstetrics and Gynecology Visit Pre-Op H&P Evaluation  Date: 12/17/2015  OBGYN Clinic: [facility]  Primary Care Provider: PROVIDER NOT IN SYSTEM  Chief Complaint:   AUB for surgery  Interval History: Patient last seen by me on  11/1 and after options d/w pt she decided on surgery. No issues or problems currently except still having some AUB.   History of Present Illness: Briana Wade is a 45 y.o. African-American [id number] (LMP: unknown), seen for the above chief complaint. Her past medical history is significant for HTN, h/o BTL, BMI 35, HTN, migraines, h/o sexual infections.   Patient seen in the MAU on 9/5 for AUB for several months, which was non painful. Hct 35 and negative wet prep, GC/CT and UPT and u/s showed 10.7 x 7.2 x 7.6cm uterus with ES 40mm and multiple fibroids, notably a 1.6cm submucosal fibroid. Patient discharged to home on megace 40 bid, which she last took two days ago when she ran out. Patient states her bleeding stopped a few days after being seen in the MAU. Prior to going to the MAU, she had regular, qmonth periods that lasted for about 4-5d and not really painful or heavy.   10/9 and had a negative pap and endometrial biopsy.  She has been seen several times for AUB and her use of PO medications appears to have been sporadic and spotty, with contradictory notes. Per her 10/27 MAU note, she states that she wasn't taking the Aygestin correctly so went back to the Megace that she was using in the past and that she never filed her Marshell Levan that was phone in for her on 10/25 by me. At that MAU visit, they increased her megace to 80 bid.   No current VB and no discharge, itching, nausea, vomiting, chest pain, sob, fevers, chills, abdominal pain, dysuria, hematuria and no climateric s/s.    No breast s/s, fevers, chills, chest pain, SOB, nausea, vomiting, abdominal pain, dysuria, hematuria, vaginal itching, dyspareunia, SUI or OAB, diarrhea,  constipation, blood in BMs  Review of Systems: Her 12 point review of systems is negative or as noted in the History of Present Illness.   Past Medical History:  Past Medical History:  Diagnosis Date  . Abdominal pain, unspecified site   . Allergy   . Anemia   . Asthma   . Chlamydia   . Diverticulitis   . Esophageal reflux   . Hiatal hernia   . Hypertension   . IBS (irritable bowel syndrome)   . Insomnia   . Lactose intolerance   . Migraine   . Other constipation   . Personal history of colonic polyps 09/12/2010   tubular adenoma  . Stroke [facility]) 2014   minor right srm weakness admitted to [facility]  . Trichimoniasis   . Tubular adenoma of colon   . Umbilical hernia   . Uterine fibroid     Past Surgical History:  Past Surgical History:  Procedure Laterality Date  . APPENDECTOMY    . APPENDECTOMY    . COLONOSCOPY    . DILATION AND CURETTAGE OF UTERUS     x2 for EAB  . TUBAL LIGATION      Past Obstetrical History:  OB History  Gravida Para Term Preterm AB Living  7 3 3   4 3   SAB TAB Ectopic Multiple Live Births  1 3          # Outcome Date GA Lbr Len/2nd Weight Sex Delivery Anes PTL Lv  7 TAB           6 TAB           5 TAB           4 SAB           3 Term           2 Term           1 Term             Obstetric Comments  svd x 3    Past Gynecological History: As per HPI. No. history of abnormal pap smears (last pap smear: pt unsure. 2012 negative pap smear in the system) Yes.   history of STIs.    Social History:  Social History   Social History  . Marital status: Single    Spouse name: N/A  . Number of children: 3  . Years of education: N/A   Occupational History  . Unemployed    Social History Main Topics  . Smoking status: Never Smoker  . Smokeless tobacco: Never Used  . Alcohol use Yes     Comment: occasionally  . Drug use: No  . Sexual activity: Yes    Birth control/ protection: Surgical   Other Topics Concern  . Not on file    Social History Narrative  . No narrative on file    Family History:  Family History  Problem Relation Age of Onset  . Hypertension Mother   . Cancer Mother     not sure of type  . Colon cancer Neg Hx   . Esophageal cancer Neg Hx   . Rectal cancer Neg Hx   . Stomach cancer Neg Hx    She denies any female cancers, bleeding or blood clotting disorders.   Health Maintenance:  Mammogram Yes , which was done in 2017 and negative  Medications [name] had no medications administered during this visit. Current Facility-Administered Medications  Medication Dose Route Frequency Provider Last Rate Last Dose  . lactated ringers infusion   Intravenous Continuous Lyn Hollingshead, MD      . scopolamine (TRANSDERM-SCOP) 1 MG/3DAYS 1.5 mg  1 patch Transdermal Once Lyn Hollingshead, MD        Allergies Patient has no known allergies.   Physical Exam:  LMP 11/04/2015  There is no height or weight on file to calculate BMI. General appearance: Well nourished, well developed female in no acute distress.  No MRGs, normal s1 and s2 CTAB Abdomen: obese, soft, nttp  Laboratory: see prior note CBC Latest Ref Rng & Units 12/12/2015 11/25/2015 11/22/2015  WBC 4.0 - 10.5 K/uL 6.8 5.1 6.6  Hemoglobin 12.0 - 15.0 g/dL 12.0 12.0 12.3  Hematocrit 36.0 - 46.0 % 36.8 36.9 37.4  Platelets 150 - 400 K/uL 321 322 327   BMP Latest Ref Rng & Units 12/12/2015 05/25/2015 05/22/2015  Glucose 65 - 99 mg/dL 85 100(H) 125(H)  BUN 6 - 20 mg/dL 9 8 9   Creatinine 0.44 - 1.00 mg/dL 0.78 0.92 0.78  Sodium 135 - 145 mmol/L 137 137 139  Potassium 3.5 - 5.1 mmol/L 3.9 3.1(L) 4.3  Chloride 101 - 111 mmol/L 107 103 105  CO2 22 - 32 mmol/L 23 24 28   Calcium 8.9 - 10.3 mg/dL 9.2 8.8(L) 8.5(L)   UPT pending  Radiology: no new imaging EXAM: TRANSABDOMINAL AND TRANSVAGINAL ULTRASOUND OF PELVIS  TECHNIQUE: Both transabdominal and transvaginal ultrasound examinations of the pelvis were performed.  Transabdominal technique was performed  for global imaging of the pelvis including uterus, ovaries, adnexal regions, and pelvic cul-de-sac. It was necessary to proceed with endovaginal exam following the transabdominal exam to visualize the bilateral ovaries.  COMPARISON:  CT abdomen pelvis dated 05/16/2015  FINDINGS: Uterus  Measurements: 10.7 x 7.2 x 7.6 cm. Multiple uterine fibroids, including:  --3.6 x 2.6 x 3.8 cm subserosal fibroid in the posterior uterine body  --2.3 x 1.9 x 1.6 cm intramural fibroid in the anterior uterine body  --2.3 x 1.6 x 2.1 cm intramural fibroid in the anterior uterine fundus  --1.6 x 1.7 x 1.7 cm submucosal fibroid along the posterior uterine body with associated mucosal component  Endometrium  Thickness: 14 mm. Submucosal fibroid with mucosal component, as noted above.  Right ovary  Measurements: 2.2 x 1.4 x 1.8 cm. Normal appearance/no adnexal mass.  Left ovary  Measurements: 3.5 x 2.4 x 2.6 cm. Normal appearance/no adnexal mass.  Other findings  No abnormal free fluid.  IMPRESSION: Multiple uterine fibroids, including a dominant 3.8 cm subserosal fibroid in the posterior uterine body.  Note is made of a 1.7 cm submucosal fibroid along the posterior uterine body with associated mucosal component.   Electronically Signed   By: Julian Hy M.D.   On: 10/14/2015 10:08  Assessment: AUB. Pt stable  Plan:  Procedure d/w pt again and pt amenable to hysteroscopy, possible novasure endometrial ablation, possible hysteroscopic myomectomy  RTC 4wks for post op check  Durene Romans MD Attending Center for [facility])

## 2015-12-17 NOTE — Anesthesia Procedure Notes (Signed)
Procedure Name: LMA Insertion Date/Time: 12/17/2015 12:03 PM Performed by: Hewitt Blade Pre-anesthesia Checklist: Patient identified, Emergency Drugs available, Suction available and Patient being monitored Patient Re-evaluated:Patient Re-evaluated prior to inductionOxygen Delivery Method: Circle system utilized Preoxygenation: Pre-oxygenation with 100% oxygen Intubation Type: IV induction LMA: LMA inserted LMA Size: 4.0 Number of attempts: 1 Placement Confirmation: positive ETCO2 and breath sounds checked- equal and bilateral Tube secured with: Tape Dental Injury: Teeth and Oropharynx as per pre-operative assessment

## 2015-12-18 ENCOUNTER — Encounter (HOSPITAL_COMMUNITY): Payer: Self-pay | Admitting: Obstetrics and Gynecology

## 2015-12-19 NOTE — Anesthesia Postprocedure Evaluation (Signed)
Anesthesia Post Note  Patient: Briana Wade  Procedure(s) Performed: Procedure(s) (LRB): HYSTEROSCOPY WITH NOVASURE (N/A)  Patient location during evaluation: PACU Anesthesia Type: General Level of consciousness: awake Pain management: pain level controlled Respiratory status: spontaneous breathing Cardiovascular status: stable Postop Assessment: no signs of nausea or vomiting Anesthetic complications: no     Last Vitals:  Vitals:   12/17/15 1500 12/17/15 1530  BP: 130/70 (!) 136/94  Pulse: 79   Resp: 12 18  Temp:  36.9 C    Last Pain:  Vitals:   12/17/15 1445  TempSrc:   PainSc: 8    Pain Goal: Patients Stated Pain Goal: 3 (12/17/15 1445)               [place]

## 2015-12-23 ENCOUNTER — Ambulatory Visit: Payer: Self-pay | Admitting: Obstetrics and Gynecology

## 2016-01-05 ENCOUNTER — Inpatient Hospital Stay (HOSPITAL_COMMUNITY)
Admission: AD | Admit: 2016-01-05 | Discharge: 2016-01-05 | Disposition: A | Discharge status: Home / Self Care | Payer: Medicaid Other | Source: Ambulatory Visit | Attending: Obstetrics & Gynecology | Admitting: Obstetrics & Gynecology

## 2016-01-05 ENCOUNTER — Encounter (HOSPITAL_COMMUNITY): Payer: Self-pay | Admitting: *Deleted

## 2016-01-05 DIAGNOSIS — Z86010 Personal history of colonic polyps: Secondary | ICD-10-CM | POA: Insufficient documentation

## 2016-01-05 DIAGNOSIS — Z8249 Family history of ischemic heart disease and other diseases of the circulatory system: Secondary | ICD-10-CM | POA: Diagnosis not present

## 2016-01-05 DIAGNOSIS — D259 Leiomyoma of uterus, unspecified: Secondary | ICD-10-CM | POA: Diagnosis not present

## 2016-01-05 DIAGNOSIS — I10 Essential (primary) hypertension: Secondary | ICD-10-CM | POA: Insufficient documentation

## 2016-01-05 DIAGNOSIS — Z809 Family history of malignant neoplasm, unspecified: Secondary | ICD-10-CM | POA: Diagnosis not present

## 2016-01-05 DIAGNOSIS — E739 Lactose intolerance, unspecified: Secondary | ICD-10-CM | POA: Diagnosis not present

## 2016-01-05 DIAGNOSIS — N939 Abnormal uterine and vaginal bleeding, unspecified: Secondary | ICD-10-CM

## 2016-01-05 DIAGNOSIS — G4700 Insomnia, unspecified: Secondary | ICD-10-CM | POA: Diagnosis not present

## 2016-01-05 DIAGNOSIS — Z3202 Encounter for pregnancy test, result negative: Secondary | ICD-10-CM | POA: Diagnosis not present

## 2016-01-05 DIAGNOSIS — R1030 Lower abdominal pain, unspecified: Secondary | ICD-10-CM

## 2016-01-05 DIAGNOSIS — Z9889 Other specified postprocedural states: Secondary | ICD-10-CM | POA: Diagnosis not present

## 2016-01-05 DIAGNOSIS — N858 Other specified noninflammatory disorders of uterus: Secondary | ICD-10-CM | POA: Diagnosis not present

## 2016-01-05 DIAGNOSIS — Z8673 Personal history of transient ischemic attack (TIA), and cerebral infarction without residual deficits: Secondary | ICD-10-CM | POA: Diagnosis not present

## 2016-01-05 DIAGNOSIS — Z8619 Personal history of other infectious and parasitic diseases: Secondary | ICD-10-CM | POA: Diagnosis not present

## 2016-01-05 DIAGNOSIS — N938 Other specified abnormal uterine and vaginal bleeding: Secondary | ICD-10-CM | POA: Insufficient documentation

## 2016-01-05 DIAGNOSIS — K589 Irritable bowel syndrome without diarrhea: Secondary | ICD-10-CM | POA: Diagnosis not present

## 2016-01-05 DIAGNOSIS — K219 Gastro-esophageal reflux disease without esophagitis: Secondary | ICD-10-CM | POA: Insufficient documentation

## 2016-01-05 DIAGNOSIS — D250 Submucous leiomyoma of uterus: Secondary | ICD-10-CM

## 2016-01-05 LAB — POCT PREGNANCY, URINE: Preg Test, Ur: NEGATIVE

## 2016-01-05 LAB — URINALYSIS, ROUTINE W REFLEX MICROSCOPIC
Bacteria, UA: NONE SEEN
Bilirubin Urine: NEGATIVE
Glucose, UA: NEGATIVE mg/dL
Ketones, ur: NEGATIVE mg/dL
Leukocytes, UA: NEGATIVE
Nitrite: NEGATIVE
Protein, ur: 30 mg/dL — AB
Specific Gravity, Urine: 1.026 (ref 1.005–1.030)
WBC, UA: NONE SEEN WBC/hpf (ref 0–5)
pH: 5 (ref 5.0–8.0)

## 2016-01-05 MED ORDER — KETOROLAC TROMETHAMINE 60 MG/2ML IM SOLN
60.0000 mg | Freq: Once | INTRAMUSCULAR | Status: AC
  Administered 2016-01-05: 60 mg via INTRAMUSCULAR
  Filled 2016-01-05: qty 2

## 2016-01-05 NOTE — MAU Provider Note (Signed)
History     CSN: [id number]  Arrival date and time: 01/05/16 1729   See by provider at 1755    Chief Complaint  Patient presents with  . Vaginal Bleeding  . Abdominal Cramping  . Nausea   HPI Briana Wade 45 y.o.   Comes to MAU with vaginal bleeding since Friday which is less than her previous menses and abdominal cramping.  She took Ibuprofen last night and hydrocodone this morning.  She did sleep last night but not as deeply as she likes to sleep.  She is still having cramping and is concerned.  She is postop from uterine ablation on 12-17-15 with Dr> Pickens.  She thought with the ablation she would not have any more vaginal bleeding or cramping.  Has a post op appointment scheduled next week.  OB History    Gravida Para Term Preterm AB Living   7 3 3   4 3    SAB TAB Ectopic Multiple Live Births   1 3            Obstetric Comments   svd x 3      Past Medical History:  Diagnosis Date  . Abdominal pain, unspecified site   . Allergy   . Anemia   . Asthma   . Chlamydia   . Diverticulitis   . Esophageal reflux   . Hiatal hernia   . Hypertension   . IBS (irritable bowel syndrome)   . Insomnia   . Lactose intolerance   . Migraine   . Other constipation   . Personal history of colonic polyps 09/12/2010   tubular adenoma  . Stroke [facility]) 2014   minor right srm weakness admitted to [facility]  . Trichimoniasis   . Tubular adenoma of colon   . Umbilical hernia   . Uterine fibroid     Past Surgical History:  Procedure Laterality Date  . APPENDECTOMY    . APPENDECTOMY    . COLONOSCOPY    . DILATION AND CURETTAGE OF UTERUS     x2 for EAB  . HYSTEROSCOPY WITH NOVASURE N/A 12/17/2015   Procedure: HYSTEROSCOPY WITH NOVASURE;  Surgeon: Aletha Halim, MD;  Location: [place] [facility];  Service: Gynecology;  Laterality: N/A;  . TUBAL LIGATION      Family History  Problem Relation Age of Onset  . Hypertension Mother   . Cancer Mother     not sure of type  . Colon  cancer Neg Hx   . Esophageal cancer Neg Hx   . Rectal cancer Neg Hx   . Stomach cancer Neg Hx     Social History  Substance Use Topics  . Smoking status: Never Smoker  . Smokeless tobacco: Never Used  . Alcohol use Yes     Comment: occasionally    Allergies: No Known Allergies  Prescriptions Prior to Admission  Medication Sig Dispense Refill Last Dose  . Doxylamine Succinate, Sleep, (SLEEP AID PO) Take by mouth daily.   12/16/2015 at Unknown time  . ibuprofen (MOTRIN IB) 200 MG tablet Take 1 tablet (200 mg total) by mouth every 6 (six) hours as needed. 30 tablet 0   . losartan (COZAAR) 50 MG tablet Take 50 mg by mouth daily.  1 12/17/2015 at 0830  . oxyCODONE-acetaminophen (ROXICET) 5-325 MG tablet Take 1 tablet by mouth every 6 (six) hours as needed for severe pain. 8 tablet 0     Review of Systems  Constitutional: Negative for fever.  Gastrointestinal: Positive for abdominal  pain. Negative for constipation, diarrhea, nausea and vomiting.  Genitourinary:       No vaginal discharge. Vaginal bleeding which started on Friday. No dysuria.   Physical Exam   Blood pressure 144/93, pulse 98, temperature 97.8 F (36.6 C), temperature source Oral, resp. rate 18, height 5\' 1"  (1.549 m), weight 209 lb 1.3 oz (94.8 kg), last menstrual period 11/04/2015.  Physical Exam  Nursing note and vitals reviewed. Constitutional: She is oriented to person, place, and time. She appears well-developed and well-nourished.  HENT:  Head: Normocephalic.  Eyes: EOM are normal.  Neck: Neck supple.  GI: Soft. There is tenderness. There is no rebound and no guarding.  Mild tenderness all acrose lower abdomen.  No area of distinct discomfort.  Genitourinary:  Genitourinary Comments: 3 cm area of light red blood on pad applied on arrival to MAU.  Small amount of blood.  Musculoskeletal: Normal range of motion.  Neurological: She is alert and oriented to person, place, and time.  Skin: Skin is warm and  dry.  Psychiatric: She has a normal mood and affect.    MAU Course  Procedures Results for orders placed or performed during the hospital encounter of 01/05/16 (from the past 24 hour(s))  Urinalysis, Routine w reflex microscopic     Status: Abnormal   Collection Time: 01/05/16  5:40 PM  Result Value Ref Range   Color, Urine YELLOW YELLOW   APPearance HAZY (A) CLEAR   Specific Gravity, Urine 1.026 1.005 - 1.030   pH 5.0 5.0 - 8.0   Glucose, UA NEGATIVE NEGATIVE mg/dL   Hgb urine dipstick LARGE (A) NEGATIVE   Bilirubin Urine NEGATIVE NEGATIVE   Ketones, ur NEGATIVE NEGATIVE mg/dL   Protein, ur 30 (A) NEGATIVE mg/dL   Nitrite NEGATIVE NEGATIVE   Leukocytes, UA NEGATIVE NEGATIVE   RBC / HPF TOO NUMEROUS TO COUNT 0 - 5 RBC/hpf   WBC, UA NONE SEEN 0 - 5 WBC/hpf   Bacteria, UA NONE SEEN NONE SEEN   Squamous Epithelial / LPF 0-5 (A) NONE SEEN   Mucous PRESENT   Pregnancy, urine POC     Status: None   Collection Time: 01/05/16  5:51 PM  Result Value Ref Range   Preg Test, Ur NEGATIVE NEGATIVE    MDM Consult with Dr. Elonda Husky re: plan of care.  Will give Toradol IM for better pain control tonight.  Reviewed with Dr. Elonda Husky to confirm  and discussed with patient that all bleeding does not stop immediately with an ablation.  Since client is not having fever, will not recheck CBC today.  No UTI seen from urine results today.  Assessment and Plan  Vaginal bleeding and cramping - post op from 12-17-15 uterine ablation Known small fibroids  Plan Toradol 60- mg IM given in MAU Keep the post op appointment and discuss expectations for bleeding and cramping. Return to MAU or call the clinic if you are having heavy vaginal bleeding, worsening abdominal pain or fever over 100.4 Continue your ibuprofen if needed- may take at 2 or 3 am tonight if needed. Advised pelvic rest and no intercourse until seen by the MD.  Virginia Rochester 01/05/2016, 6:09 PM

## 2016-01-05 NOTE — Discharge Instructions (Signed)
Keep the post op appointment and discuss expectations for bleeding and cramping. Return to MAU or call the clinic if you are having heavy vaginal bleeding, worsening abdominal pain or fever over 100.4 Continue your ibuprofen if needed- may take at 2 or 3 am tonight if needed. Advised pelvic rest and no intercourse until seen by the MD.

## 2016-01-05 NOTE — MAU Note (Signed)
Patient presents with onset of vaginal bleeding, cramping since Friday, nausea started today, had hysteroscopy in November was having spotting.

## 2016-01-06 ENCOUNTER — Telehealth: Payer: Self-pay | Admitting: General Practice

## 2016-01-06 NOTE — Telephone Encounter (Signed)
Patient called and left message stating she has some questions about her surgery. Patient states she had a hysteroscopy done recently and she is having bad cramps and some bleeding. Per chart review patient was in MAU last night. Patient called back into front office again asking to speak with someone and she is not going to leave another message. Patient states she is still having bleeding and cramps and she is tired of this. No one told her she was going to continue to bleed. Discussed with patient that unfortunately nothing is an instant fix and it does take time for her body to heal after surgery during which she may still have bleeding. Discussed normal bleeding post surgery from discharge instructions. Discussed her staying positive and hanging in there a little longer as things should improve for her in the next several weeks. Reminded patient of appt in office next week on the 21st. Patient verbalized understanding & had no questions

## 2016-01-14 ENCOUNTER — Inpatient Hospital Stay (HOSPITAL_COMMUNITY): Payer: Medicaid Other

## 2016-01-14 ENCOUNTER — Encounter (HOSPITAL_COMMUNITY): Payer: Self-pay

## 2016-01-14 ENCOUNTER — Inpatient Hospital Stay (HOSPITAL_COMMUNITY)
Admission: AD | Admit: 2016-01-14 | Discharge: 2016-01-14 | Disposition: A | Discharge status: Home / Self Care | Payer: Medicaid Other | Source: Ambulatory Visit | Attending: Obstetrics and Gynecology | Admitting: Obstetrics and Gynecology

## 2016-01-14 DIAGNOSIS — K5792 Diverticulitis of intestine, part unspecified, without perforation or abscess without bleeding: Secondary | ICD-10-CM | POA: Insufficient documentation

## 2016-01-14 DIAGNOSIS — K429 Umbilical hernia without obstruction or gangrene: Secondary | ICD-10-CM | POA: Insufficient documentation

## 2016-01-14 DIAGNOSIS — R1032 Left lower quadrant pain: Secondary | ICD-10-CM | POA: Diagnosis present

## 2016-01-14 DIAGNOSIS — Z3202 Encounter for pregnancy test, result negative: Secondary | ICD-10-CM | POA: Insufficient documentation

## 2016-01-14 LAB — COMPREHENSIVE METABOLIC PANEL
ALT: 20 U/L (ref 14–54)
ANION GAP: 7 (ref 5–15)
AST: 19 U/L (ref 15–41)
Albumin: 3.7 g/dL (ref 3.5–5.0)
Alkaline Phosphatase: 77 U/L (ref 38–126)
BUN: 7 mg/dL (ref 6–20)
CHLORIDE: 104 mmol/L (ref 101–111)
CO2: 24 mmol/L (ref 22–32)
CREATININE: 0.83 mg/dL (ref 0.44–1.00)
Calcium: 8.9 mg/dL (ref 8.9–10.3)
Glucose, Bld: 91 mg/dL (ref 65–99)
POTASSIUM: 4.2 mmol/L (ref 3.5–5.1)
SODIUM: 135 mmol/L (ref 135–145)
Total Bilirubin: 0.2 mg/dL — ABNORMAL LOW (ref 0.3–1.2)
Total Protein: 7 g/dL (ref 6.5–8.1)

## 2016-01-14 LAB — URINALYSIS, MICROSCOPIC (REFLEX)

## 2016-01-14 LAB — CBC WITH DIFFERENTIAL/PLATELET
Basophils Absolute: 0 10*3/uL (ref 0.0–0.1)
Basophils Relative: 0 %
EOS ABS: 0.1 10*3/uL (ref 0.0–0.7)
EOS PCT: 3 %
HCT: 36.4 % (ref 36.0–46.0)
Hemoglobin: 12 g/dL (ref 12.0–15.0)
LYMPHS ABS: 1.7 10*3/uL (ref 0.7–4.0)
LYMPHS PCT: 34 %
MCH: 26.9 pg (ref 26.0–34.0)
MCHC: 33 g/dL (ref 30.0–36.0)
MCV: 81.6 fL (ref 78.0–100.0)
MONO ABS: 0.3 10*3/uL (ref 0.1–1.0)
Monocytes Relative: 6 %
Neutro Abs: 3 10*3/uL (ref 1.7–7.7)
Neutrophils Relative %: 57 %
Platelets: 258 10*3/uL (ref 150–400)
RBC: 4.46 MIL/uL (ref 3.87–5.11)
RDW: 14.8 % (ref 11.5–15.5)
WBC: 5.1 10*3/uL (ref 4.0–10.5)

## 2016-01-14 LAB — URINALYSIS, ROUTINE W REFLEX MICROSCOPIC
Bilirubin Urine: NEGATIVE
GLUCOSE, UA: NEGATIVE mg/dL
Ketones, ur: NEGATIVE mg/dL
LEUKOCYTES UA: NEGATIVE
NITRITE: NEGATIVE
PH: 6 (ref 5.0–8.0)
PROTEIN: NEGATIVE mg/dL
Specific Gravity, Urine: 1.02 (ref 1.005–1.030)

## 2016-01-14 LAB — LIPASE, BLOOD: LIPASE: 22 U/L (ref 11–51)

## 2016-01-14 LAB — AMYLASE: AMYLASE: 69 U/L (ref 28–100)

## 2016-01-14 LAB — POCT PREGNANCY, URINE: Preg Test, Ur: NEGATIVE

## 2016-01-14 MED ORDER — HYDROMORPHONE HCL 1 MG/ML IJ SOLN: 1.0000 mg | Freq: Once | INTRAMUSCULAR | Status: DC

## 2016-01-14 MED ORDER — CIPROFLOXACIN HCL 500 MG PO TABS: 500.0000 mg | ORAL_TABLET | Freq: Two times a day (BID) | ORAL | 0 refills | Status: DC

## 2016-01-14 MED ORDER — METRONIDAZOLE 500 MG PO TABS: 500.0000 mg | ORAL_TABLET | Freq: Three times a day (TID) | ORAL | 0 refills | Status: DC

## 2016-01-14 MED ORDER — HYDROMORPHONE HCL 1 MG/ML IJ SOLN
1.0000 mg | Freq: Once | INTRAMUSCULAR | Status: AC
  Administered 2016-01-14: 1 mg via INTRAMUSCULAR
  Filled 2016-01-14: qty 1

## 2016-01-14 MED ORDER — SODIUM CHLORIDE 0.9 % IV SOLN: INTRAVENOUS | Status: DC

## 2016-01-14 MED ORDER — IOPAMIDOL (ISOVUE-300) INJECTION 61%: 30.0000 mL | INTRAVENOUS | Status: AC

## 2016-01-14 NOTE — MAU Provider Note (Signed)
Chief Complaint:  Abdominal Pain   First Provider Initiated Contact with Patient 01/14/16 1527       HPI: Briana Wade is a 45 y.o. [id number] who presents to maternity admissions reporting Lower abdominal pain for the past day or so.  Thinks it is her diverticulitis.  States it feels the same as when she was hospitalized for that in April 2017   Did not go to her f/u appt with Dr Hilarie Fredrickson  Had an ablation almost a month ago. Due for postop appt in 2 days. Has a little spotting since surgery but no heavy bleeding.   Sensation of needing to defecate, did have BM today.  States pain did not improve.  Has pain with BMs, has to strain to go   Not on a stool softener. She reports vaginal spotting, but denies vaginal itching/burning, urinary symptoms, h/a, dizziness, n/v, or fever/chills.    Abdominal Pain  This is a new problem. The current episode started yesterday. The onset quality is gradual. The problem occurs constantly. The problem has been gradually worsening. The pain is located in the LLQ. The pain is severe. The quality of the pain is aching, cramping and sharp. The abdominal pain does not radiate. Associated symptoms include constipation. Pertinent negatives include no anorexia, diarrhea, dysuria, fever, frequency, myalgias, nausea or vomiting. The pain is aggravated by palpation and certain positions. The pain is relieved by nothing. She has tried nothing for the symptoms. Ablation a month ago    RN Note: Pt had ablation in November, C/O LLQ pain since yesterday, has hx of diverticulitis & fibroids.  Is having some spotting but not heavy bleeding.  Denies nausea, vomiting, diarrhea or fever.  Feels like she needs to have a BM but can't go, did have BM earlier today  Past Medical History: Past Medical History:  Diagnosis Date  . Abdominal pain, unspecified site   . Allergy   . Anemia   . Asthma   . Chlamydia   . Diverticulitis   . Esophageal reflux   . Hiatal hernia   .  Hypertension   . IBS (irritable bowel syndrome)   . Insomnia   . Lactose intolerance   . Migraine   . Other constipation   . Personal history of colonic polyps 09/12/2010   tubular adenoma  . Stroke [facility]) 2014   minor right srm weakness admitted to [facility]  . Trichimoniasis   . Tubular adenoma of colon   . Umbilical hernia   . Uterine fibroid     Past obstetric history: OB History  Gravida Para Term Preterm AB Living  7 3 3   4 3   SAB TAB Ectopic Multiple Live Births  1 3          # Outcome Date GA Lbr Len/2nd Weight Sex Delivery Anes PTL Lv  7 TAB           6 TAB           5 TAB           4 SAB           3 Term           2 Term           1 Term             Obstetric Comments  svd x 3    Past Surgical History: Past Surgical History:  Procedure Laterality Date  . APPENDECTOMY    .  APPENDECTOMY    . COLONOSCOPY    . DILATION AND CURETTAGE OF UTERUS     x2 for EAB  . HYSTEROSCOPY WITH NOVASURE N/A 12/17/2015   Procedure: HYSTEROSCOPY WITH NOVASURE;  Surgeon: Aletha Halim, MD;  Location: [place] [facility];  Service: Gynecology;  Laterality: N/A;  . TUBAL LIGATION      Family History: Family History  Problem Relation Age of Onset  . Hypertension Mother   . Cancer Mother     not sure of type  . Colon cancer Neg Hx   . Esophageal cancer Neg Hx   . Rectal cancer Neg Hx   . Stomach cancer Neg Hx     Social History: Social History  Substance Use Topics  . Smoking status: Never Smoker  . Smokeless tobacco: Never Used  . Alcohol use Yes     Comment: occasionally    Allergies: No Known Allergies  Meds:  Prescriptions Prior to Admission  Medication Sig Dispense Refill Last Dose  . ibuprofen (MOTRIN IB) 200 MG tablet Take 1 tablet (200 mg total) by mouth every 6 (six) hours as needed. 30 tablet 0 01/13/2016 at Unknown time  . losartan (COZAAR) 50 MG tablet Take 50 mg by mouth daily.  1 01/14/2016 at Unknown time  . oxyCODONE-acetaminophen (ROXICET) 5-325 MG  tablet Take 1 tablet by mouth every 6 (six) hours as needed for severe pain. (Patient not taking: Reported on 01/14/2016) 8 tablet 0 Not Taking at Unknown time    I have reviewed patient's Past Medical Hx, Surgical Hx, Family Hx, Social Hx, medications and allergies.  ROS:  Review of Systems  Constitutional: Negative for fever.  Gastrointestinal: Positive for abdominal pain and constipation. Negative for anorexia, diarrhea, nausea and vomiting.  Genitourinary: Negative for dysuria and frequency.  Musculoskeletal: Negative for myalgias.   Other systems negative     Physical Exam  Patient Vitals for the past 24 hrs:  BP Temp Temp src Pulse Resp  01/14/16 1454 139/91 98.1 F (36.7 C) Oral 93 18   Constitutional: Well-developed, well-nourished female in no acute distress.  Cardiovascular: normal rate and rhythm, no ectopy audible, S1 & S2 heard, no murmur Respiratory: normal effort, no distress. Lungs CTAB with no wheezes or crackles GI: Abd soft, quite tender over LLQ and SP area.  Mildly distended.  No rebound, No guarding.  Bowel Sounds audible  MS: Extremities nontender, no edema, normal ROM Neurologic: Alert and oriented x 4.   Grossly nonfocal. GU: Neg CVAT. Skin:  Warm and Dry Psych:  Affect appropriate.  PELVIC EXAM: Bimanual exam: Cervix firm, anterior, neg CMT, uterus slightly tender but no CMT, nonenlarged, adnexa without tenderness, enlargement, or mass    Labs: Results for orders placed or performed during the hospital encounter of 01/14/16 (from the past 24 hour(s))  Urinalysis, Routine w reflex microscopic     Status: Abnormal   Collection Time: 01/14/16  3:05 PM  Result Value Ref Range   Color, Urine YELLOW YELLOW   APPearance CLEAR CLEAR   Specific Gravity, Urine 1.020 1.005 - 1.030   pH 6.0 5.0 - 8.0   Glucose, UA NEGATIVE NEGATIVE mg/dL   Hgb urine dipstick SMALL (A) NEGATIVE   Bilirubin Urine NEGATIVE NEGATIVE   Ketones, ur NEGATIVE NEGATIVE mg/dL    Protein, ur NEGATIVE NEGATIVE mg/dL   Nitrite NEGATIVE NEGATIVE   Leukocytes, UA NEGATIVE NEGATIVE  Urinalysis, Microscopic (reflex)     Status: Abnormal   Collection Time: 01/14/16  3:05 PM  Result Value  Ref Range   RBC / HPF 0-5 0 - 5 RBC/hpf   WBC, UA 0-5 0 - 5 WBC/hpf   Bacteria, UA FEW (A) NONE SEEN   Squamous Epithelial / LPF 0-5 (A) NONE SEEN   Urine-Other MICROSCOPIC EXAM PERFORMED ON UNCONCENTRATED URINE   Pregnancy, urine POC     Status: None   Collection Time: 01/14/16  3:09 PM  Result Value Ref Range   Preg Test, Ur NEGATIVE NEGATIVE  CBC with Differential/Platelet     Status: None   Collection Time: 01/14/16  3:48 PM  Result Value Ref Range   WBC 5.1 4.0 - 10.5 K/uL   RBC 4.46 3.87 - 5.11 MIL/uL   Hemoglobin 12.0 12.0 - 15.0 g/dL   HCT 36.4 36.0 - 46.0 %   MCV 81.6 78.0 - 100.0 fL   MCH 26.9 26.0 - 34.0 pg   MCHC 33.0 30.0 - 36.0 g/dL   RDW 14.8 11.5 - 15.5 %   Platelets 258 150 - 400 K/uL   Neutrophils Relative % 57 %   Neutro Abs 3.0 1.7 - 7.7 K/uL   Lymphocytes Relative 34 %   Lymphs Abs 1.7 0.7 - 4.0 K/uL   Monocytes Relative 6 %   Monocytes Absolute 0.3 0.1 - 1.0 K/uL   Eosinophils Relative 3 %   Eosinophils Absolute 0.1 0.0 - 0.7 K/uL   Basophils Relative 0 %   Basophils Absolute 0.0 0.0 - 0.1 K/uL  Comprehensive metabolic panel     Status: Abnormal   Collection Time: 01/14/16  3:48 PM  Result Value Ref Range   Sodium 135 135 - 145 mmol/L   Potassium 4.2 3.5 - 5.1 mmol/L   Chloride 104 101 - 111 mmol/L   CO2 24 22 - 32 mmol/L   Glucose, Bld 91 65 - 99 mg/dL   BUN 7 6 - 20 mg/dL   Creatinine, Ser 0.83 0.44 - 1.00 mg/dL   Calcium 8.9 8.9 - 10.3 mg/dL   Total Protein 7.0 6.5 - 8.1 g/dL   Albumin 3.7 3.5 - 5.0 g/dL   AST 19 15 - 41 U/L   ALT 20 14 - 54 U/L   Alkaline Phosphatase 77 38 - 126 U/L   Total Bilirubin 0.2 (L) 0.3 - 1.2 mg/dL   GFR calc non Af Amer >60 >60 mL/min   GFR calc Af Amer >60 >60 mL/min   Anion gap 7 5 - 15  Amylase      Status: None   Collection Time: 01/14/16  3:48 PM  Result Value Ref Range   Amylase 69 28 - 100 U/L  Lipase, blood     Status: None   Collection Time: 01/14/16  3:48 PM  Result Value Ref Range   Lipase 22 11 - 51 U/L    Imaging:  Ct Abdomen Pelvis Wo Contrast  Result Date: 01/14/2016 CLINICAL DATA:  Left lower quadrant abdominal pain. Endometrial ablation in November, with some pain since yesterday an with mild vaginal spotting. EXAM: CT ABDOMEN AND PELVIS WITHOUT CONTRAST TECHNIQUE: Multidetector CT imaging of the abdomen and pelvis was performed following the standard protocol without IV contrast. COMPARISON:  05/24/2015 FINDINGS: Lower chest: No acute abnormality. Hepatobiliary: No focal liver abnormality is seen. No gallstones, gallbladder wall thickening, or biliary dilatation. Pancreas: Unremarkable. No pancreatic ductal dilatation or surrounding inflammatory changes. Spleen: Normal in size without focal abnormality. Adrenals/Urinary Tract: Adrenal glands are unremarkable. Kidneys are normal, without renal calculi, focal lesion, or hydronephrosis. Bladder is unremarkable. Stomach/Bowel:  There is acute inflammation surrounding a diverticulum of the proximal sigmoid colon, typical of acute diverticulitis. No abscess. No bowel obstruction. No extraluminal air. Moderate diverticulosis of the left hemicolon. Moderate hiatal hernia. Stomach, small bowel and right hemicolon are otherwise unremarkable. Prior appendectomy. Vascular/Lymphatic: No significant vascular findings are present. No enlarged abdominal or pelvic lymph nodes. Reproductive: Enlarged uterus with multiple mass contours consistent with multiple fibroids. No adnexal abnormalities. Other: No ascites. Moderately large fat containing umbilical hernia. Musculoskeletal: No significant skeletal lesion. Stable anterior wedging of L1. IMPRESSION: Acute diverticulitis, proximal sigmoid colon. Multi fibroid uterus.  Fat containing umbilical  hernia. Electronically Signed   By: Andreas Newport M.D.   On: 01/14/2016 21:41    MAU Course/MDM: I have ordered labs as follows:  CBC, CMET, amylase, lipase    All of these values are normal Imaging ordered: none yet. May need CT to confirm possible Diverticulitis Results reviewed.   Consult Dr Ilda Basset with history, presentation, and exam findings.Marland Kitchen  He recommends transfer to [facility] for evaluation and treatment of probable recurrent diverticulitis.  I called Dr Zenia Resides at  [facility] who states he thinks we should do the CT here and if it shows need for admission, then she should be directly admitted to inpatient service and not go through ED.   Treatments in MAU included Dilaudid, which did not change her pain.Bonne Dolores to start IV for CT without success. Will do it without IV contrast   CT showed acute diverticulitis Reconsulted Dr Zenia Resides at [facility], he recommends discharge home with Cipro and Flagyl  Urged her to Call Dr Hilarie Fredrickson ([place]) to see him ASAP for long term planning Agrees  Assessment: 4 Weeks Postop from a Novasure ablation for DUB and fibroids Lower abdominal pain, LLQ History of Diverticulitis Acute Diverticulitis, stable, now asking to eat and more comfortable Fat containing umbilical hernia   Plan: Discharge home Rx Cipro and Flagyl x 7 days (per UpToDate protocol) Advance diet as tolerated Report increased pain or fever to ED Follow up with Dr Elam Dutch s postop appt this Thursday with Dr Ilda Basset   Encouraged to return here or to other Urgent Care/ED if she develops worsening of symptoms, increase in pain, fever, or other concerning symptoms.   Hansel Feinstein CNM, MSN Certified Nurse-Midwife 01/14/2016 3:38 PM

## 2016-01-14 NOTE — MAU Note (Signed)
Pt had ablation in November, C/O LLQ pain since yesterday, has hx of diverticulitis & fibroids.  Is having some spotting but not heavy bleeding.  Denies nausea, vomiting, diarrhea or fever.  Feels like she needs to have a BM but can't go, did have BM earlier today.

## 2016-01-14 NOTE — Discharge Instructions (Signed)
Diverticulitis °Diverticulitis is when small pockets that have formed in your colon (large intestine) become infected or swollen. °Follow these instructions at home: °· Follow your doctor's instructions. °· Follow a special diet if told by your doctor. °· When you feel better, your doctor may tell you to change your diet. You may be told to eat a lot of fiber. Fruits and vegetables are good sources of fiber. Fiber makes it easier to poop (have bowel movements). °· Take supplements or probiotics as told by your doctor. °· Only take medicines as told by your doctor. °· Keep all follow-up visits with your doctor. °Contact a doctor if: °· Your pain does not get better. °· You have a hard time eating food. °· You are not pooping like normal. °Get help right away if: °· Your pain gets worse. °· Your problems do not get better. °· Your problems suddenly get worse. °· You have a fever. °· You keep throwing up (vomiting). °· You have bloody or black, tarry poop (stool). °This information is not intended to replace advice given to you by your health care provider. Make sure you discuss any questions you have with your health care provider. °Document Released: 07/01/2007 Document Revised: 06/20/2015 Document Reviewed: 12/07/2012 °Elsevier Interactive Patient Education © 2017 [employer] ° °

## 2016-01-16 ENCOUNTER — Ambulatory Visit (INDEPENDENT_AMBULATORY_CARE_PROVIDER_SITE_OTHER): Payer: Medicaid Other | Admitting: Obstetrics and Gynecology

## 2016-01-16 VITALS — BP 140/90 | HR 72 | Wt 209.0 lb

## 2016-01-16 DIAGNOSIS — Z09 Encounter for follow-up examination after completed treatment for conditions other than malignant neoplasm: Secondary | ICD-10-CM

## 2016-01-16 NOTE — Progress Notes (Signed)
Obstetrics and Gynecology Visit Postop Patient Evaluation  Appointment Date: 01/16/2016  Chief Complaint: post op visit  History of Present Illness:  Briana Wade is a 45 y.o. that had a h/s novasure ablation on 11/21 for AUB (irregular and heavy)  Interval History: Since that time, she states that she had some bleeding two weeks ago that felt like a period and she was surprised by that so she took some megace which helped and her diverticulitis pain is better on the abx. No pain that feels uterine and has occasional spotting  Review of Systems: as noted in the History of Present Illness.  Medications: reviewed Allergies: has No Known Allergies.  Physical Exam:  BP 140/90   Pulse 72   Wt 209 lb (94.8 kg)   LMP 01/05/2016   BMI 39.49 kg/m  Body mass index is 39.49 kg/m. General appearance: Well nourished, well developed female in no acute distress.  Abdomen: diffusely non tender to palpation, non distended, and no masses, hernias Neuro/Psych:  Normal mood and affect.    Pelvic exam:  Normal on 12/19 in the MAU   Assessment: Patient doing well  Plan: I told her it sounds like she's doing well and that it can take a few cycles after an ablation to get full effect seen but she sounds like she's healing well from the procedure. Will see back in a few months to follow up spotting but if it resolves, patient can cancel appointment. Pt amenable to plan  Durene Romans MD Attending Center for [facility])

## 2016-01-22 ENCOUNTER — Encounter: Payer: Self-pay | Admitting: Podiatry

## 2016-01-22 ENCOUNTER — Telehealth: Payer: Self-pay | Admitting: *Deleted

## 2016-01-22 ENCOUNTER — Ambulatory Visit (INDEPENDENT_AMBULATORY_CARE_PROVIDER_SITE_OTHER): Payer: Medicaid Other | Admitting: Podiatry

## 2016-01-22 VITALS — HR 84 | Resp 16 | Ht 61.0 in | Wt 209.0 lb

## 2016-01-22 DIAGNOSIS — L600 Ingrowing nail: Secondary | ICD-10-CM | POA: Diagnosis not present

## 2016-01-22 NOTE — Patient Instructions (Signed)

## 2016-01-22 NOTE — Telephone Encounter (Signed)
Pt states she was in the office today and the instructions say antibacterial soap, but what brand. Left message informing pt we use Dial, but she could use what type she liked.

## 2016-01-23 ENCOUNTER — Telehealth: Payer: Self-pay | Admitting: *Deleted

## 2016-01-23 NOTE — Telephone Encounter (Signed)
Pt states she has some questions. Pt asked if she was to get a prescription, and I told her no she was to use OTC neosporin. Pt states she has triple antibiotic ointment and I told her that was the same. Pt states the toe is still painful. I told pt that I saw Dr. Ilda Basset had given her Ibuprofen before and if she tolerated that she could take it as directed. I told pt to rinse the antibacterial soap off her foot after each soak, and then apply the triple antibiotic ointment and bandaid. Pt states understanding.

## 2016-01-23 NOTE — Progress Notes (Signed)
Subjective:     Patient ID: Briana Wade, female   DOB: 12/25/1970, 45 y.o.   MRN: [id number]  HPI patient presents stating she has painful ingrown toenails of the hallux bilaterally been present for years and she tries to get pedicures but it's not giving her relief anymore   Review of Systems  All other systems reviewed and are negative.      Objective:   Physical Exam  Constitutional: She is oriented to person, place, and time.  Cardiovascular: Intact distal pulses.   Musculoskeletal: Normal range of motion.  Neurological: She is oriented to person, place, and time.  Skin: Skin is warm.  Nursing note and vitals reviewed.  neurovascular status intact muscle strength adequate range of motion within normal limits with patient found to have incurvated hallux nails medial border bilateral that are painful with crusted tissue formation but no active drainage noted. Patient has good digital perfusion and is well oriented 3     Assessment:     Chronic ingrown toenail deformity hallux bilateral with pain and failure to respond to conservative care    Plan:     H&P and condition reviewed. I do think removal of the medial corners could give her great relief and I did explained procedure and risk. Patient wants procedure understanding risk and today I infiltrated each hallux with 60 mg Xylocaine Marcaine mixture and under sterile conditions remove the medial borders exposed matrix and applied phenol 3 applications 30 seconds followed by alcohol lavage and sterile dressing. Gave instructions on soaks and reappoint

## 2016-01-29 ENCOUNTER — Ambulatory Visit: Payer: Self-pay | Admitting: Nurse Practitioner

## 2016-03-09 ENCOUNTER — Ambulatory Visit: Payer: Self-pay | Admitting: Obstetrics and Gynecology

## 2016-04-27 ENCOUNTER — Inpatient Hospital Stay (HOSPITAL_COMMUNITY)
Admission: EM | Admit: 2016-04-27 | Discharge: 2016-05-02 | DRG: 392 | Disposition: A | Discharge status: Home / Self Care | Payer: Medicaid Other | Attending: Internal Medicine | Admitting: Internal Medicine

## 2016-04-27 ENCOUNTER — Emergency Department (HOSPITAL_COMMUNITY): Payer: Medicaid Other

## 2016-04-27 ENCOUNTER — Encounter (HOSPITAL_COMMUNITY): Payer: Self-pay | Admitting: Emergency Medicine

## 2016-04-27 DIAGNOSIS — R52 Pain, unspecified: Secondary | ICD-10-CM

## 2016-04-27 DIAGNOSIS — R1032 Left lower quadrant pain: Secondary | ICD-10-CM

## 2016-04-27 DIAGNOSIS — R112 Nausea with vomiting, unspecified: Secondary | ICD-10-CM | POA: Diagnosis not present

## 2016-04-27 DIAGNOSIS — Z8673 Personal history of transient ischemic attack (TIA), and cerebral infarction without residual deficits: Secondary | ICD-10-CM

## 2016-04-27 DIAGNOSIS — I10 Essential (primary) hypertension: Secondary | ICD-10-CM | POA: Diagnosis present

## 2016-04-27 DIAGNOSIS — R109 Unspecified abdominal pain: Secondary | ICD-10-CM | POA: Diagnosis present

## 2016-04-27 DIAGNOSIS — K589 Irritable bowel syndrome without diarrhea: Secondary | ICD-10-CM | POA: Diagnosis present

## 2016-04-27 DIAGNOSIS — Z79899 Other long term (current) drug therapy: Secondary | ICD-10-CM

## 2016-04-27 DIAGNOSIS — K219 Gastro-esophageal reflux disease without esophagitis: Secondary | ICD-10-CM | POA: Diagnosis present

## 2016-04-27 DIAGNOSIS — D649 Anemia, unspecified: Secondary | ICD-10-CM | POA: Diagnosis present

## 2016-04-27 DIAGNOSIS — Z6841 Body Mass Index (BMI) 40.0 and over, adult: Secondary | ICD-10-CM | POA: Diagnosis not present

## 2016-04-27 DIAGNOSIS — N852 Hypertrophy of uterus: Secondary | ICD-10-CM | POA: Diagnosis present

## 2016-04-27 DIAGNOSIS — E876 Hypokalemia: Secondary | ICD-10-CM | POA: Diagnosis not present

## 2016-04-27 DIAGNOSIS — K909 Intestinal malabsorption, unspecified: Secondary | ICD-10-CM | POA: Diagnosis present

## 2016-04-27 DIAGNOSIS — J45909 Unspecified asthma, uncomplicated: Secondary | ICD-10-CM | POA: Diagnosis present

## 2016-04-27 DIAGNOSIS — K5730 Diverticulosis of large intestine without perforation or abscess without bleeding: Secondary | ICD-10-CM | POA: Diagnosis present

## 2016-04-27 DIAGNOSIS — K5792 Diverticulitis of intestine, part unspecified, without perforation or abscess without bleeding: Secondary | ICD-10-CM | POA: Diagnosis present

## 2016-04-27 DIAGNOSIS — K5720 Diverticulitis of large intestine with perforation and abscess without bleeding: Secondary | ICD-10-CM | POA: Diagnosis present

## 2016-04-27 DIAGNOSIS — E669 Obesity, unspecified: Secondary | ICD-10-CM | POA: Diagnosis present

## 2016-04-27 DIAGNOSIS — E739 Lactose intolerance, unspecified: Secondary | ICD-10-CM | POA: Diagnosis present

## 2016-04-27 LAB — COMPREHENSIVE METABOLIC PANEL
ALK PHOS: 85 U/L (ref 38–126)
ALT: 15 U/L (ref 14–54)
ANION GAP: 8 (ref 5–15)
AST: 19 U/L (ref 15–41)
Albumin: 3.6 g/dL (ref 3.5–5.0)
BUN: 12 mg/dL (ref 6–20)
CALCIUM: 8.6 mg/dL — AB (ref 8.9–10.3)
CO2: 21 mmol/L — AB (ref 22–32)
CREATININE: 0.75 mg/dL (ref 0.44–1.00)
Chloride: 106 mmol/L (ref 101–111)
Glucose, Bld: 104 mg/dL — ABNORMAL HIGH (ref 65–99)
Potassium: 3.9 mmol/L (ref 3.5–5.1)
SODIUM: 135 mmol/L (ref 135–145)
Total Bilirubin: 0.4 mg/dL (ref 0.3–1.2)
Total Protein: 7 g/dL (ref 6.5–8.1)

## 2016-04-27 LAB — CBC WITH DIFFERENTIAL/PLATELET
Basophils Absolute: 0 10*3/uL (ref 0.0–0.1)
Basophils Relative: 0 %
EOS ABS: 0.2 10*3/uL (ref 0.0–0.7)
EOS PCT: 2 %
HCT: 38.2 % (ref 36.0–46.0)
HEMOGLOBIN: 12.3 g/dL (ref 12.0–15.0)
LYMPHS ABS: 1.6 10*3/uL (ref 0.7–4.0)
LYMPHS PCT: 21 %
MCH: 27.6 pg (ref 26.0–34.0)
MCHC: 32.2 g/dL (ref 30.0–36.0)
MCV: 85.8 fL (ref 78.0–100.0)
MONOS PCT: 5 %
Monocytes Absolute: 0.4 10*3/uL (ref 0.1–1.0)
Neutro Abs: 5.3 10*3/uL (ref 1.7–7.7)
Neutrophils Relative %: 72 %
PLATELETS: 243 10*3/uL (ref 150–400)
RBC: 4.45 MIL/uL (ref 3.87–5.11)
RDW: 14.9 % (ref 11.5–15.5)
WBC: 7.5 10*3/uL (ref 4.0–10.5)

## 2016-04-27 LAB — URINALYSIS, ROUTINE W REFLEX MICROSCOPIC
BILIRUBIN URINE: NEGATIVE
Glucose, UA: NEGATIVE mg/dL
Hgb urine dipstick: NEGATIVE
Ketones, ur: NEGATIVE mg/dL
Leukocytes, UA: NEGATIVE
Nitrite: NEGATIVE
PH: 5 (ref 5.0–8.0)
Protein, ur: NEGATIVE mg/dL
SPECIFIC GRAVITY, URINE: 1.025 (ref 1.005–1.030)

## 2016-04-27 LAB — I-STAT CG4 LACTIC ACID, ED: LACTIC ACID, VENOUS: 1.31 mmol/L (ref 0.5–1.9)

## 2016-04-27 LAB — POC URINE PREG, ED: PREG TEST UR: NEGATIVE

## 2016-04-27 MED ORDER — LOSARTAN POTASSIUM 50 MG PO TABS
50.0000 mg | ORAL_TABLET | Freq: Every day | ORAL | Status: DC
  Administered 2016-04-27 – 2016-05-02 (×5): 50 mg via ORAL
  Filled 2016-04-27 (×6): qty 1

## 2016-04-27 MED ORDER — IOPAMIDOL (ISOVUE-300) INJECTION 61%
INTRAVENOUS | Status: AC
  Filled 2016-04-27: qty 100

## 2016-04-27 MED ORDER — SODIUM CHLORIDE 0.9 % IV SOLN
10.0000 mg | Freq: Two times a day (BID) | INTRAVENOUS | Status: DC
  Administered 2016-04-27 – 2016-05-02 (×10): 10 mg via INTRAVENOUS
  Filled 2016-04-27 (×13): qty 1

## 2016-04-27 MED ORDER — ENOXAPARIN SODIUM 40 MG/0.4ML ~~LOC~~ SOLN
40.0000 mg | Freq: Every day | SUBCUTANEOUS | Status: DC
  Administered 2016-04-27 – 2016-05-02 (×5): 40 mg via SUBCUTANEOUS
  Filled 2016-04-27 (×6): qty 0.4

## 2016-04-27 MED ORDER — FENTANYL CITRATE (PF) 100 MCG/2ML IJ SOLN
100.0000 ug | Freq: Once | INTRAMUSCULAR | Status: AC
  Administered 2016-04-27: 100 ug via INTRAVENOUS
  Filled 2016-04-27: qty 2

## 2016-04-27 MED ORDER — ONDANSETRON HCL 4 MG PO TABS: 4.0000 mg | ORAL_TABLET | Freq: Four times a day (QID) | ORAL | Status: DC | PRN

## 2016-04-27 MED ORDER — CIPROFLOXACIN IN D5W 400 MG/200ML IV SOLN
400.0000 mg | Freq: Two times a day (BID) | INTRAVENOUS | Status: DC
  Administered 2016-04-27 – 2016-04-29 (×4): 400 mg via INTRAVENOUS
  Filled 2016-04-27 (×3): qty 200

## 2016-04-27 MED ORDER — FENTANYL CITRATE (PF) 100 MCG/2ML IJ SOLN
25.0000 ug | Freq: Once | INTRAMUSCULAR | Status: AC
  Administered 2016-04-27: 25 ug via INTRAVENOUS
  Filled 2016-04-27: qty 2

## 2016-04-27 MED ORDER — ACETAMINOPHEN 650 MG RE SUPP: 650.0000 mg | Freq: Four times a day (QID) | RECTAL | Status: DC | PRN

## 2016-04-27 MED ORDER — ONDANSETRON HCL 4 MG/2ML IJ SOLN
4.0000 mg | Freq: Four times a day (QID) | INTRAMUSCULAR | Status: DC | PRN
  Administered 2016-04-27: 4 mg via INTRAVENOUS
  Filled 2016-04-27: qty 2

## 2016-04-27 MED ORDER — METRONIDAZOLE IN NACL 5-0.79 MG/ML-% IV SOLN
500.0000 mg | Freq: Once | INTRAVENOUS | Status: AC
  Administered 2016-04-27: 500 mg via INTRAVENOUS
  Filled 2016-04-27: qty 100

## 2016-04-27 MED ORDER — SODIUM CHLORIDE 0.9 % IV SOLN
INTRAVENOUS | Status: DC
  Administered 2016-04-27 – 2016-05-01 (×6): via INTRAVENOUS

## 2016-04-27 MED ORDER — DEXTROSE 5 % IV SOLN
2.0000 g | Freq: Once | INTRAVENOUS | Status: AC
  Administered 2016-04-27: 2 g via INTRAVENOUS
  Filled 2016-04-27: qty 2

## 2016-04-27 MED ORDER — ONDANSETRON HCL 4 MG/2ML IJ SOLN
4.0000 mg | Freq: Once | INTRAMUSCULAR | Status: AC
  Administered 2016-04-27: 4 mg via INTRAVENOUS
  Filled 2016-04-27: qty 2

## 2016-04-27 MED ORDER — SODIUM CHLORIDE 0.9 % IV BOLUS (SEPSIS)
1000.0000 mL | Freq: Once | INTRAVENOUS | Status: AC
  Administered 2016-04-27: 1000 mL via INTRAVENOUS

## 2016-04-27 MED ORDER — HYDROMORPHONE HCL 1 MG/ML IJ SOLN
2.0000 mg | INTRAMUSCULAR | Status: DC | PRN
Start: 2016-04-27 — End: 2016-05-02
  Administered 2016-04-27 – 2016-04-28 (×3): 2 mg via INTRAVENOUS
  Filled 2016-04-27 (×3): qty 2

## 2016-04-27 MED ORDER — ONDANSETRON HCL 4 MG/2ML IJ SOLN
4.0000 mg | INTRAMUSCULAR | Status: DC | PRN
  Administered 2016-04-27 – 2016-04-29 (×3): 4 mg via INTRAVENOUS
  Filled 2016-04-27 (×3): qty 2

## 2016-04-27 MED ORDER — IOPAMIDOL (ISOVUE-300) INJECTION 61%
100.0000 mL | Freq: Once | INTRAVENOUS | Status: AC | PRN
  Administered 2016-04-27: 100 mL via INTRAVENOUS

## 2016-04-27 MED ORDER — ACETAMINOPHEN 325 MG PO TABS: 650.0000 mg | ORAL_TABLET | Freq: Four times a day (QID) | ORAL | Status: DC | PRN

## 2016-04-27 MED ORDER — HYDRALAZINE HCL 20 MG/ML IJ SOLN
10.0000 mg | Freq: Four times a day (QID) | INTRAMUSCULAR | Status: DC | PRN
  Administered 2016-04-29: 10 mg via INTRAVENOUS
  Filled 2016-04-27: qty 1

## 2016-04-27 MED ORDER — METRONIDAZOLE IN NACL 5-0.79 MG/ML-% IV SOLN
500.0000 mg | Freq: Three times a day (TID) | INTRAVENOUS | Status: DC
  Administered 2016-04-27 – 2016-04-29 (×5): 500 mg via INTRAVENOUS
  Filled 2016-04-27 (×5): qty 100

## 2016-04-27 MED ORDER — HYDROMORPHONE HCL 1 MG/ML IJ SOLN
1.0000 mg | Freq: Once | INTRAMUSCULAR | Status: AC
  Administered 2016-04-27: 1 mg via INTRAVENOUS
  Filled 2016-04-27: qty 1

## 2016-04-27 NOTE — Consult Note (Signed)
[facility] Surgery Consult Note  Briana Wade [facility] 1970/11/23  [id number].    Requesting MD: Clementeen Graham, MD Chief Complaint/Reason for Consult: recurrent diverticulitis   HPI:  46 year-old AA female with a PMH hypertension, asthma, GERD, uterine fibroids, tubular adenoma of the colon, and recurrent diverticulitis who presents to [facility] with two days of abdominal pain. Patient describes the pain as constant and dull, located over her left lower abdomen. She denies any alleviating factors. Pain gradually worsened bringing her to the ED. Associated symptoms include one episode of nausea/vomiting and recent straining to have a BM. She expresses a recent history of hard stools. She is unable to report if she has abdominal distention, stating that her abdomen always "sticks out a little bit". Patient reports 4 previous hospital admissions for diverticulitis, all of which improved with IV antibiotics. Chart review in epic significant for hospital admissions for diverticulitis 01/14/2016, 05/21/2015, 06/02/2011, and 09/2010. Previous abdominal surgeries include appendectomy and tubal ligation. Denies use of blood thinning medications. Denies tobacco use. Reports drinking alcohol roughly 3 times monthly.   ED workup significant for CT scan revealing acute sigmoid diverticulitis with contained perforation and no well-defined abscess. The patient is afebrile. CBC and CMET are without significant abnormality.   ROS: Review of Systems  Constitutional: Negative for chills and fever.  Respiratory: Negative for cough, hemoptysis and shortness of breath.   Cardiovascular: Negative for chest pain and palpitations.  Gastrointestinal: Positive for abdominal pain and constipation. Negative for blood in stool, diarrhea, heartburn, melena, nausea and vomiting.  Genitourinary: Negative.   Musculoskeletal: Negative.   Neurological: Negative.   All other systems reviewed and are negative.   Family History  Problem  Relation Age of Onset  . Hypertension Mother   . Cancer Mother     not sure of type  . Colon cancer Neg Hx   . Esophageal cancer Neg Hx   . Rectal cancer Neg Hx   . Stomach cancer Neg Hx     Past Medical History:  Diagnosis Date  . Abdominal pain, unspecified site   . Allergy   . Anemia   . Asthma   . Chlamydia   . Diverticulitis   . Esophageal reflux   . Hiatal hernia   . Hypertension   . IBS (irritable bowel syndrome)   . Insomnia   . Lactose intolerance   . Migraine   . Other constipation   . Personal history of colonic polyps 09/12/2010   tubular adenoma  . Stroke [facility]) 2014   minor right srm weakness admitted to [facility]  . Trichimoniasis   . Tubular adenoma of colon   . Umbilical hernia   . Uterine fibroid     Past Surgical History:  Procedure Laterality Date  . APPENDECTOMY    . APPENDECTOMY    . COLONOSCOPY    . DILATION AND CURETTAGE OF UTERUS     x2 for EAB  . HYSTEROSCOPY WITH NOVASURE N/A 12/17/2015   Procedure: HYSTEROSCOPY WITH NOVASURE;  Surgeon: Aletha Halim, MD;  Location: [facility];  Service: Gynecology;  Laterality: N/A;  . TUBAL LIGATION      Social History:  reports that she has never smoked. She has never used smokeless tobacco. She reports that she drinks alcohol. She reports that she does not use drugs.  Allergies: No Known Allergies   (Not in a hospital admission)  Blood pressure 131/89, pulse 71, temperature 97.8 F (36.6 C), temperature source Oral, resp. rate 16, height '5\' 1"'$  (1.549  m), weight 96.2 kg (212 lb), SpO2 100 %. Physical Exam: Physical Exam  Constitutional: She is oriented to person, place, and time. She appears well-developed and well-nourished.  HENT:  Head: Normocephalic and atraumatic.  Eyes: EOM are normal. Pupils are equal, round, and reactive to light.  Neck: Normal range of motion. No tracheal deviation present.  Cardiovascular: Normal rate, regular rhythm and normal heart sounds.   Pulmonary/Chest:  Effort normal and breath sounds normal. No respiratory distress. She has no wheezes. She has no rales. She exhibits no tenderness.  Abdominal: Soft. She exhibits distension. She exhibits no mass. There is tenderness (LLQ tenderness to light palpation). There is guarding. A hernia (perimbulical hernia, soft, reducible) is present.  Musculoskeletal: She exhibits no deformity.  Neurological: She is alert and oriented to person, place, and time.  Skin: Skin is warm and dry. She is not diaphoretic.    Results for orders placed or performed during the hospital encounter of 04/27/16 (from the past 48 hour(s))  Urinalysis, Routine w reflex microscopic- may I&O cath if menses     Status: Abnormal   Collection Time: 04/27/16  4:46 AM  Result Value Ref Range   Color, Urine YELLOW YELLOW   APPearance HAZY (A) CLEAR   Specific Gravity, Urine 1.025 1.005 - 1.030   pH 5.0 5.0 - 8.0   Glucose, UA NEGATIVE NEGATIVE mg/dL   Hgb urine dipstick NEGATIVE NEGATIVE   Bilirubin Urine NEGATIVE NEGATIVE   Ketones, ur NEGATIVE NEGATIVE mg/dL   Protein, ur NEGATIVE NEGATIVE mg/dL   Nitrite NEGATIVE NEGATIVE   Leukocytes, UA NEGATIVE NEGATIVE  POC urine preg, ED (not at [facility])     Status: None   Collection Time: 04/27/16  4:51 AM  Result Value Ref Range   Preg Test, Ur NEGATIVE NEGATIVE    Comment:        THE SENSITIVITY OF THIS METHODOLOGY IS >24 mIU/mL   CBC with Differential     Status: None   Collection Time: 04/27/16  5:05 AM  Result Value Ref Range   WBC 7.5 4.0 - 10.5 K/uL   RBC 4.45 3.87 - 5.11 MIL/uL   Hemoglobin 12.3 12.0 - 15.0 g/dL   HCT 38.2 36.0 - 46.0 %   MCV 85.8 78.0 - 100.0 fL   MCH 27.6 26.0 - 34.0 pg   MCHC 32.2 30.0 - 36.0 g/dL   RDW 14.9 11.5 - 15.5 %   Platelets 243 150 - 400 K/uL   Neutrophils Relative % 72 %   Neutro Abs 5.3 1.7 - 7.7 K/uL   Lymphocytes Relative 21 %   Lymphs Abs 1.6 0.7 - 4.0 K/uL   Monocytes Relative 5 %   Monocytes Absolute 0.4 0.1 - 1.0 K/uL    Eosinophils Relative 2 %   Eosinophils Absolute 0.2 0.0 - 0.7 K/uL   Basophils Relative 0 %   Basophils Absolute 0.0 0.0 - 0.1 K/uL  Comprehensive metabolic panel     Status: Abnormal   Collection Time: 04/27/16  5:05 AM  Result Value Ref Range   Sodium 135 135 - 145 mmol/L   Potassium 3.9 3.5 - 5.1 mmol/L   Chloride 106 101 - 111 mmol/L   CO2 21 (L) 22 - 32 mmol/L   Glucose, Bld 104 (H) 65 - 99 mg/dL   BUN 12 6 - 20 mg/dL   Creatinine, Ser 0.75 0.44 - 1.00 mg/dL   Calcium 8.6 (L) 8.9 - 10.3 mg/dL   Total Protein 7.0 6.5 - 8.1  g/dL   Albumin 3.6 3.5 - 5.0 g/dL   AST 19 15 - 41 U/L   ALT 15 14 - 54 U/L   Alkaline Phosphatase 85 38 - 126 U/L   Total Bilirubin 0.4 0.3 - 1.2 mg/dL   GFR calc non Af Amer >60 >60 mL/min   GFR calc Af Amer >60 >60 mL/min    Comment: (NOTE) The eGFR has been calculated using the CKD EPI equation. This calculation has not been validated in all clinical situations. eGFR's persistently <60 mL/min signify possible Chronic Kidney Disease.    Anion gap 8 5 - 15  I-Stat CG4 Lactic Acid, ED     Status: None   Collection Time: 04/27/16  5:39 AM  Result Value Ref Range   Lactic Acid, Venous 1.31 0.5 - 1.9 mmol/L   Ct Abdomen Pelvis W Contrast  Result Date: 04/27/2016 CLINICAL DATA:  47 year old female with left flank pain for few days getting worse. History diverticulitis. Prior appendectomy. Subsequent encounter. EXAM: CT ABDOMEN AND PELVIS WITH CONTRAST TECHNIQUE: Multidetector CT imaging of the abdomen and pelvis was performed using the standard protocol following bolus administration of intravenous contrast. CONTRAST:  175m ISOVUE-300 IOPAMIDOL (ISOVUE-300) INJECTION 61% COMPARISON:  04/27/2016 plain film examination.  01/14/2016 CT. FINDINGS: Exam is motion degraded. Lower chest: Basilar subsegmental atelectasis/ scarring. Heart slightly enlarged. Hepatobiliary: Mild fatty infiltration of the liver without worrisome hepatic lesion. No calcified gallstone.  Pancreas: No mass, inflammation or pancreatic duct dilation. Spleen: No mass or enlargement. Adrenals/Urinary Tract: No renal or ureteral obstructing stone or evidence hydronephrosis. No adrenal or worrisome renal mass. Noncontrast filled views of the urinary bladder without gross abnormality. Stomach/Bowel: Diverticulitis proximal sigmoid colon with focal contained perforation anterior inferior aspect. Inflammatory process along the posterior portion of the proximal sigmoid colon infiltrates the mesentery with small amount of fluid extending along the lower left pericolic gutter. Currently, no well-defined drainable abscess. Small amount of fluid within the right pelvis may reflect result of the inflammatory process or recent ovulation. Evaluation of the bowel at the level of the proximal sigmoid colon diverticulitis is limited secondary to motion, muscular hypertrophy and inflammation. Scattered diverticula seen throughout remainder of the colon. Moderate-size hiatal hernia. Vascular/Lymphatic: No aneurysm or large vessel occlusion. Small pelvic lymph nodes greater on left may be reactive in origin. Reproductive: Enlarged uterus with heterogeneous appearance suggestive of underlying fibroids. The endometrial lining appears slightly irregular. Findings can be assessed on elective pelvic sonogram after acute episode has clear. There may be a corpus luteum cyst on the right. 2 cm left ovarian cyst/follicle. Other: Slightly asymmetric breast parenchyma incompletely assessed. Fat containing periumbilical hernia without bowel containing hernia. Musculoskeletal: Anterior wedge compression deformity L1 vertebra possibly congenital with superimposed degenerative changes superior endplate. Rudimentary disc at S1-2. IMPRESSION: Exam is motion degraded. Diverticulitis proximal sigmoid colon with focal contained perforation anterior inferior aspect. Inflammatory process along the posterior portion of the proximal sigmoid colon  infiltrates the mesentery with small amount of fluid extending along the lower left pericolic gutter. Currently, no well-defined drainable abscess. Small amount of fluid within the right pelvis may reflect result of the inflammatory process or recent ovulation. Moderate hiatal hernia. Enlarged uterus with heterogeneous appearance suggestive of underlying fibroids. The endometrial lining appears slightly irregular. Findings can be assessed on elective pelvic sonogram after acute episode has clear These results were called by telephone at the time of interpretation on 04/27/2016 at 8:28 am to Dr. ZRoderic Palauwho verbally acknowledged these results. Electronically Signed  By: Genia Del M.D.   On: 04/27/2016 08:41   Dg Abd Acute W/chest  Result Date: 04/27/2016 CLINICAL DATA:  LEFT abdominal pain for 2 days, constipation, nausea. History of diverticulitis. EXAM: DG ABDOMEN ACUTE W/ 1V CHEST COMPARISON:  CT abdomen and pelvis January 14, 2016 FINDINGS: Cardiomediastinal silhouette is unremarkable for this low inspiratory examination with crowded vasculature markings. The lungs are clear without pleural effusions or focal consolidations. Trachea projects midline and there is no pneumothorax. Included soft tissue planes and osseous structures are non-suspicious. Bowel gas pattern is nondilated and nonobstructive. Mild retained large bowel stool. No intra-abdominal mass effect or pathologic calcifications. No intraperitoneal free air. Soft tissue planes included osseous structures are nonsuspicious, large body habitus. IMPRESSION: No acute cardiopulmonary process for this low inspiratory portable examination. Normal bowel gas pattern. Electronically Signed   By: Elon Alas M.D.   On: 04/27/2016 06:11   Assessment/Plan Acute sigmoid diverticulitis, recurrent, with contained perforation - recommend hospital admission for IV antibiotics (ceftriaxone and metronidazole), bowel rest, and observation  - given  recurrent nature of the disease, if patient improves with IV antibiotics this admission, would recommend outpatient surgical consultation for elective sigmoid colectomy.  - clinical deterioration with worsening abdominal pain, fever, or leukocytosis may warrant IR consultation for percutaneous drainage of related abscess or surgical intervention.   Jill Alexanders, [facility] 04/27/2016, 10:35 AM Pager: [phone] Consults: [phone] Mon-Fri 7:00 am-4:30 pm Sat-Sun 7:00 am-11:30 am

## 2016-04-27 NOTE — ED Notes (Signed)
Pt was given ginger ale for po challenge. 

## 2016-04-27 NOTE — H&P (Addendum)
TRH H&P   Patient Demographics:    Briana Wade, is a 46 y.o. female  MRN: [id number]   DOB - 1970-09-27  Admit Date - 04/27/2016  Outpatient Primary MD for the patient is PROVIDER NOT IN SYSTEM  Referring MD: Dr. Roderic Palau  Outpatient Specialists: Seen by [facility] in past  Patient coming from: Home  Chief Complaint  Patient presents with      Left lower quadrant pain 2 days    HPI:    Briana Wade  is a 46 y.o. female, With history of IBS, recurrent sigmoid diverticulitis, asthma, GERD, tubular adenoma of colon, uterine fibroid status post in the mitral ablation in November 2017, obesity who presented to the ED with 2 day history of dull left lower quadrant pain. Patient reports symptoms to be similar to her prior sigmoid diverticulitis. Patient has had multiple history of sigmoid diverticulitis with numerous CT imaging over the past 5-6 years. Patient reported being in usual state of health when she developed sudden onset of dull left lower quadrant abdominal pain, nonradiating, which was progressive with nausea and one episode of vomiting yesterday. The pain was 10/10 in severity aggravated on movement and attempting to pass flatus or have bowel movement not relieved by NSAIDs to see to get home. Patient denies any fevers or chills, headache, blurred vision, chest pain, palpitations, shortness of breath, dysuria or diarrhea. Last bowel movement was yesterday. Patient has been able to pass flatus. She is unsure if she has abdominal distention. She denies any new medications or any over-the-counter supplement. Denies any sick contact or recent travel.   course in the ED Vitals stable except for elevated blood pressure. Blood work showed normal CBC and electrolytes. LFTs and Lactic acid was normal. UA was unremarkable. Urine pregnancy test was negative. A CT scan of the abdomen  and pelvis was done which again showed acute sigmoid diverticulitis with contained perforation anteriorly. Patient received 1 L IV normal saline bolus followed by IV Zofran 2, IV fentanyl (total 125 g) and 1 mg IV Dilaudid with minimal relief in symptoms). She was also given IV Rocephin and Flagyl. Neck son hospitalist admission requested.  ED going to call general surgery.     Review of systems:    As outlined in history of present illness above, 10 point review of systems otherwise unremarkable.     With Past History of the following :    Past Medical History:  Diagnosis Date  . Abdominal pain, unspecified site   . Allergy   . Anemia   . Asthma   . Chlamydia   . Diverticulitis   . Esophageal reflux   . Hiatal hernia   . Hypertension   . IBS (irritable bowel syndrome)   . Insomnia   . Lactose intolerance   . Migraine   . Other constipation   . Personal history of  colonic polyps 09/12/2010   tubular adenoma  . Stroke [facility]) 2014   minor right srm weakness admitted to [facility]  . Trichimoniasis   . Tubular adenoma of colon   . Umbilical hernia   . Uterine fibroid       Past Surgical History:  Procedure Laterality Date  . APPENDECTOMY    . APPENDECTOMY    . COLONOSCOPY    . DILATION AND CURETTAGE OF UTERUS     x2 for EAB  . HYSTEROSCOPY WITH NOVASURE N/A 12/17/2015   Procedure: HYSTEROSCOPY WITH NOVASURE;  Surgeon: Aletha Halim, MD;  Location: [facility];  Service: Gynecology;  Laterality: N/A;  . TUBAL LIGATION        Social History:     Social History  Substance Use Topics  . Smoking status: Never Smoker  . Smokeless tobacco: Never Used  . Alcohol use Yes     Comment: occasionally     Lives - At home with husband   Mobility -  Independent   Family History  Problem Relation Age of Onset  . Hypertension Mother   . Cancer Mother     not sure of type  . Colon cancer Neg Hx   . Esophageal cancer Neg Hx   . Rectal cancer Neg Hx   . Stomach  cancer Neg Hx       Home Medications:   Prior to Admission medications   Medication Sig Start Date End Date Taking? Authorizing Provider  losartan (COZAAR) 50 MG tablet Take 50 mg by mouth daily.   Yes Historical Provider, MD  ibuprofen (MOTRIN IB) 200 MG tablet Take 1 tablet (200 mg total) by mouth every 6 (six) hours as needed. Patient not taking: Reported on 04/27/2016 12/17/15   Aletha Halim, MD  metroNIDAZOLE (FLAGYL) 500 MG tablet Take 1 tablet (500 mg total) by mouth every 8 (eight) hours. Patient not taking: Reported on 01/22/2016 01/14/16   Seabron Spates, CNM     Allergies:    No Known Allergies   Physical Exam:   Vitals  Blood pressure 131/89, pulse 71, temperature 97.8 F (36.6 C), temperature source Oral, resp. rate 16, height 5\' 1"  (1.549 m), weight 96.2 kg (212 lb), SpO2 100 %.   Gen.: Middle aged obese female lying in bed in some distress with pain HEENT: No pallor, moist mucosa, no icterus, supple neck Chest: Clear to auscultation bilaterally no sign severe as: Normal S1 and S2, no murmurs rub or gallop GI: Soft, distended, bowel sounds present, left lower quadrant tenderness to minimal pressure musculoskeletal: Warm, no edema  CNS: Alert and oriented    Data Review:    CBC  Recent Labs Lab 04/27/16 0505  WBC 7.5  HGB 12.3  HCT 38.2  PLT 243  MCV 85.8  MCH 27.6  MCHC 32.2  RDW 14.9  LYMPHSABS 1.6  MONOABS 0.4  EOSABS 0.2  BASOSABS 0.0   ------------------------------------------------------------------------------------------------------------------  Chemistries   Recent Labs Lab 04/27/16 0505  NA 135  K 3.9  CL 106  CO2 21*  GLUCOSE 104*  BUN 12  CREATININE 0.75  CALCIUM 8.6*  AST 19  ALT 15  ALKPHOS 85  BILITOT 0.4   ------------------------------------------------------------------------------------------------------------------ estimated creatinine clearance is 93.2 mL/min (by C-G formula based on SCr of 0.75  mg/dL). ------------------------------------------------------------------------------------------------------------------ No results for input(s): TSH, T4TOTAL, T3FREE, THYROIDAB in the last 72 hours.  Invalid input(s): FREET3  Coagulation profile No results for input(s): INR, PROTIME in the last 168 hours. ------------------------------------------------------------------------------------------------------------------- No  results for input(s): DDIMER in the last 72 hours. -------------------------------------------------------------------------------------------------------------------  Cardiac Enzymes No results for input(s): CKMB, TROPONINI, MYOGLOBIN in the last 168 hours.  Invalid input(s): CK ------------------------------------------------------------------------------------------------------------------ No results found for: BNP   ---------------------------------------------------------------------------------------------------------------  Urinalysis    Component Value Date/Time   COLORURINE YELLOW 04/27/2016 0446   APPEARANCEUR HAZY (A) 04/27/2016 0446   LABSPEC 1.025 04/27/2016 0446   PHURINE 5.0 04/27/2016 0446   GLUCOSEU NEGATIVE 04/27/2016 0446   HGBUR NEGATIVE 04/27/2016 0446   BILIRUBINUR NEGATIVE 04/27/2016 0446   KETONESUR NEGATIVE 04/27/2016 0446   PROTEINUR NEGATIVE 04/27/2016 0446   UROBILINOGEN 0.2 08/27/2014 1348   NITRITE NEGATIVE 04/27/2016 0446   LEUKOCYTESUR NEGATIVE 04/27/2016 0446    ----------------------------------------------------------------------------------------------------------------   Imaging Results:    Ct Abdomen Pelvis W Contrast  Result Date: 04/27/2016 CLINICAL DATA:  46 year old female with left flank pain for few days getting worse. History diverticulitis. Prior appendectomy. Subsequent encounter. EXAM: CT ABDOMEN AND PELVIS WITH CONTRAST TECHNIQUE: Multidetector CT imaging of the abdomen and pelvis was performed using the  standard protocol following bolus administration of intravenous contrast. CONTRAST:  129mL ISOVUE-300 IOPAMIDOL (ISOVUE-300) INJECTION 61% COMPARISON:  04/27/2016 plain film examination.  01/14/2016 CT. FINDINGS: Exam is motion degraded. Lower chest: Basilar subsegmental atelectasis/ scarring. Heart slightly enlarged. Hepatobiliary: Mild fatty infiltration of the liver without worrisome hepatic lesion. No calcified gallstone. Pancreas: No mass, inflammation or pancreatic duct dilation. Spleen: No mass or enlargement. Adrenals/Urinary Tract: No renal or ureteral obstructing stone or evidence hydronephrosis. No adrenal or worrisome renal mass. Noncontrast filled views of the urinary bladder without gross abnormality. Stomach/Bowel: Diverticulitis proximal sigmoid colon with focal contained perforation anterior inferior aspect. Inflammatory process along the posterior portion of the proximal sigmoid colon infiltrates the mesentery with small amount of fluid extending along the lower left pericolic gutter. Currently, no well-defined drainable abscess. Small amount of fluid within the right pelvis may reflect result of the inflammatory process or recent ovulation. Evaluation of the bowel at the level of the proximal sigmoid colon diverticulitis is limited secondary to motion, muscular hypertrophy and inflammation. Scattered diverticula seen throughout remainder of the colon. Moderate-size hiatal hernia. Vascular/Lymphatic: No aneurysm or large vessel occlusion. Small pelvic lymph nodes greater on left may be reactive in origin. Reproductive: Enlarged uterus with heterogeneous appearance suggestive of underlying fibroids. The endometrial lining appears slightly irregular. Findings can be assessed on elective pelvic sonogram after acute episode has clear. There may be a corpus luteum cyst on the right. 2 cm left ovarian cyst/follicle. Other: Slightly asymmetric breast parenchyma incompletely assessed. Fat containing  periumbilical hernia without bowel containing hernia. Musculoskeletal: Anterior wedge compression deformity L1 vertebra possibly congenital with superimposed degenerative changes superior endplate. Rudimentary disc at S1-2. IMPRESSION: Exam is motion degraded. Diverticulitis proximal sigmoid colon with focal contained perforation anterior inferior aspect. Inflammatory process along the posterior portion of the proximal sigmoid colon infiltrates the mesentery with small amount of fluid extending along the lower left pericolic gutter. Currently, no well-defined drainable abscess. Small amount of fluid within the right pelvis may reflect result of the inflammatory process or recent ovulation. Moderate hiatal hernia. Enlarged uterus with heterogeneous appearance suggestive of underlying fibroids. The endometrial lining appears slightly irregular. Findings can be assessed on elective pelvic sonogram after acute episode has clear These results were called by telephone at the time of interpretation on 04/27/2016 at 8:28 am to Dr. Roderic Palau who verbally acknowledged these results. Electronically Signed   By: Genia Del M.D.   On: 04/27/2016 08:41  Dg Abd Acute W/chest  Result Date: 04/27/2016 CLINICAL DATA:  LEFT abdominal pain for 2 days, constipation, nausea. History of diverticulitis. EXAM: DG ABDOMEN ACUTE W/ 1V CHEST COMPARISON:  CT abdomen and pelvis January 14, 2016 FINDINGS: Cardiomediastinal silhouette is unremarkable for this low inspiratory examination with crowded vasculature markings. The lungs are clear without pleural effusions or focal consolidations. Trachea projects midline and there is no pneumothorax. Included soft tissue planes and osseous structures are non-suspicious. Bowel gas pattern is nondilated and nonobstructive. Mild retained large bowel stool. No intra-abdominal mass effect or pathologic calcifications. No intraperitoneal free air. Soft tissue planes included osseous structures are  nonsuspicious, large body habitus. IMPRESSION: No acute cardiopulmonary process for this low inspiratory portable examination. Normal bowel gas pattern. Electronically Signed   By: Elon Alas M.D.   On: 04/27/2016 06:11    My personal review of EKG: Pending  Assessment & Plan:    Principal problem Acute recurrent sigmoid diverticulitis Admit to [place]. CT shows proximal sigmoid diverticulitis with focal contained perforation in the anterior inferior aspect. Small amount of fluid tracking to the pericolic gutter but no signs of abscess. Admit to MedSurg. Place on empiric IV ciprofloxacin and Flagyl. Keep nothing by mouth. IV hydration with normal saline. Monitor electrolytes. Serial abdominal exam. Supportive care with IV Dilaudid and antiemetics. Surgery to be consulted by ED physician given her contained perforation.   Active Problems:   Irritable bowel syndrome Not on any medications. Was seen by [facility] previously.    GERD (gastroesophageal reflux disease) Not taking any medication at present. Will place on Pepcid twice a day.    Nausea and vomiting Empiric antiemetics.    HTN (hypertension) Resume losartan. Will place on when necessary hydralazine.    Patient does not see a PCP at present and is planning to establish care at [facility].   DVT Prophylaxis : Subcutaneous Lovenox  Diet: Nothing by mouth, except for meds.  AM Labs Ordered, also please review Full Orders  Family Communication: Admission, patients condition and plan of care including tests being ordered have been discussed with the patient at bedside   Code Statu Full code Likely DC to Home   Condition GUARDED   Consult called: [place] [facility]     Time spent in minutes: 60    :Louellen Molder M.D on 04/27/2016 at 9:25 AM  Between 7am to 7pm - Pager - [phone]. After 7pm go to [email or web address] - password [facility]  [facility] - Office  [phone]

## 2016-04-27 NOTE — ED Provider Notes (Signed)
[facility] DEPT Provider Note   CSN: [id number] Arrival date & time: 04/27/16  0422     History   Chief Complaint Chief Complaint  Patient presents with  . Flank Pain    HPI SHEILYN BOEHLKE is a 46 y.o. female.   Abdominal Pain   This is a recurrent problem. The current episode started more than 2 days ago. The problem occurs daily. The problem has been gradually worsening. The pain is located in the LLQ. The quality of the pain is aching. The pain is severe. Associated symptoms include nausea. Pertinent negatives include fever, diarrhea, hematochezia, melena, vomiting and dysuria. The symptoms are aggravated by certain positions and palpation. The symptoms are relieved by being still. Past workup includes GI consult and CT scan. Past medical history comments: recurrent sigmoid diverticulitis.  patient presents for LLQ pain that started "a couple of days ago" She reports associated nausea She reports pain abruptly worsened over past 12-24 hours This is similar to prior diverticulitis She denies h/o surgery She reports nonbloody bowel movement yesterday Past Medical History:  Diagnosis Date  . Abdominal pain, unspecified site   . Allergy   . Anemia   . Asthma   . Chlamydia   . Diverticulitis   . Esophageal reflux   . Hiatal hernia   . Hypertension   . IBS (irritable bowel syndrome)   . Insomnia   . Lactose intolerance   . Migraine   . Other constipation   . Personal history of colonic polyps 09/12/2010   tubular adenoma  . Stroke [facility]) 2014   minor right srm weakness admitted to [facility]  . Trichimoniasis   . Tubular adenoma of colon   . Umbilical hernia   . Uterine fibroid     Patient Active Problem List   Diagnosis Date Noted  . Diverticula of colon 05/21/2015  . Asthma 05/21/2015  . Acute diverticulitis 05/21/2015  . Diverticulitis of sigmoid colon 06/02/2011  . Nausea and vomiting 06/02/2011  . Otitis 06/02/2011  . HTN (hypertension) 06/02/2011    . GERD (gastroesophageal reflux disease) 10/21/2010  . Gluten intolerance 10/21/2010  . Personal history of colonic polyps 10/21/2010  . Flatulence, eructation, and gas pain 09/12/2010  . 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 Irritable bowel syndrome 09/12/2010  . Abdominal pain 08/26/2010  . Bloating 08/26/2010  . 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 IBS (irritable bowel syndrome) 08/26/2010  . CONSTIPATION, CHRONIC 05/11/2007  . ABDOMINAL PAIN, CHRONIC 05/11/2007    Past Surgical History:  Procedure Laterality Date  . APPENDECTOMY    . APPENDECTOMY    . COLONOSCOPY    . DILATION AND CURETTAGE OF UTERUS     x2 for EAB  . HYSTEROSCOPY WITH NOVASURE N/A 12/17/2015   Procedure: HYSTEROSCOPY WITH NOVASURE;  Surgeon: Aletha Halim, MD;  Location: [facility];  Service: Gynecology;  Laterality: N/A;  . TUBAL LIGATION      OB History    Gravida Para Term Preterm AB Living   7 3 3   4 3    SAB TAB Ectopic Multiple Live Births   1 3            Obstetric Comments   svd x 3       Home Medications    Prior to Admission medications   Medication Sig Start Date End Date Taking? Authorizing Provider  ibuprofen (MOTRIN IB) 200 MG tablet Take 1 tablet (200 mg total) by mouth every 6 (six) hours as  needed. 12/17/15   Aletha Halim, MD  losartan (COZAAR) 50 MG tablet Take 50 mg by mouth daily.    Historical Provider, MD  metroNIDAZOLE (FLAGYL) 500 MG tablet Take 1 tablet (500 mg total) by mouth every 8 (eight) hours. Patient not taking: Reported on 01/22/2016 01/14/16   Seabron Spates, CNM    Family History Family History  Problem Relation Age of Onset  . Hypertension Mother   . Cancer Mother     not sure of type  . Colon cancer Neg Hx   . Esophageal cancer Neg Hx   . Rectal cancer Neg Hx   . Stomach cancer Neg Hx     Social History Social History  Substance Use Topics  . Smoking status: Never Smoker  . Smokeless  tobacco: Never Used  . Alcohol use Yes     Comment: occasionally     Allergies   Patient has no known allergies.   Review of Systems Review of Systems  Constitutional: Negative for fever.  Cardiovascular: Negative for chest pain.  Gastrointestinal: Positive for abdominal pain and nausea. Negative for diarrhea, hematochezia, melena and vomiting.  Genitourinary: Negative for dysuria.  All other systems reviewed and are negative.    Physical Exam Updated Vital Signs BP (!) 173/123 (BP Location: Left Arm)   Pulse 90   Temp 97.8 F (36.6 C) (Oral)   Resp 18   Ht 5\' 1"  (1.549 m)   Wt 96.2 kg   SpO2 95%   BMI 40.06 kg/m   Physical Exam CONSTITUTIONAL: Well developed/well nourished, uncomfortable appearing HEAD: Normocephalic/atraumatic, anxious EYES: EOMI/PERRL ENMT: Mucous membranes moist NECK: supple no meningeal signs SPINE/BACK:entire spine nontender CV: S1/S2 noted, no murmurs/rubs/gallops noted LUNGS: Lungs are clear to auscultation bilaterally, no apparent distress ABDOMEN: soft, moderate LLQ tenderness, no rebound or guarding, bowel sounds noted throughout abdomen.  She is obese.  Abdomen is protuberant GU:no cva tenderness NEURO: Pt is awake/alert/appropriate, moves all extremitiesx4.  No facial droop.   EXTREMITIES: pulses normal/equal, full ROM SKIN: warm, color normal PSYCH: anxious  ED Treatments / Results  Labs (all labs ordered are listed, but only abnormal results are displayed) Labs Reviewed  URINALYSIS, ROUTINE W REFLEX MICROSCOPIC - Abnormal; Notable for the following:       Result Value   APPearance HAZY (*)    All other components within normal limits  COMPREHENSIVE METABOLIC PANEL - Abnormal; Notable for the following:    CO2 21 (*)    Glucose, Bld 104 (*)    Calcium 8.6 (*)    All other components within normal limits  CBC WITH DIFFERENTIAL/PLATELET  POC URINE PREG, ED  I-STAT CG4 LACTIC ACID, ED    EKG  EKG Interpretation None        Radiology Dg Abd Acute W/chest  Result Date: 04/27/2016 CLINICAL DATA:  LEFT abdominal pain for 2 days, constipation, nausea. History of diverticulitis. EXAM: DG ABDOMEN ACUTE W/ 1V CHEST COMPARISON:  CT abdomen and pelvis January 14, 2016 FINDINGS: Cardiomediastinal silhouette is unremarkable for this low inspiratory examination with crowded vasculature markings. The lungs are clear without pleural effusions or focal consolidations. Trachea projects midline and there is no pneumothorax. Included soft tissue planes and osseous structures are non-suspicious. Bowel gas pattern is nondilated and nonobstructive. Mild retained large bowel stool. No intra-abdominal mass effect or pathologic calcifications. No intraperitoneal free air. Soft tissue planes included osseous structures are nonsuspicious, large body habitus. IMPRESSION: No acute cardiopulmonary process for this low inspiratory portable examination. Normal  bowel gas pattern. Electronically Signed   By: Elon Alas M.D.   On: 04/27/2016 06:11    Procedures Procedures (including critical care time)  Medications Ordered in ED Medications  sodium chloride 0.9 % bolus 1,000 mL (1,000 mLs Intravenous New Bag/Given 04/27/16 0537)  fentaNYL (SUBLIMAZE) injection 100 mcg (100 mcg Intravenous Given 04/27/16 0538)  ondansetron (ZOFRAN) injection 4 mg (4 mg Intravenous Given 04/27/16 0537)  cefTRIAXone (ROCEPHIN) 2 g in dextrose 5 % 50 mL IVPB (0 g Intravenous Stopped 04/27/16 0605)    And  metroNIDAZOLE (FLAGYL) IVPB 500 mg (0 mg Intravenous Stopped 04/27/16 0650)  fentaNYL (SUBLIMAZE) injection 25 mcg (25 mcg Intravenous Given 04/27/16 0650)     Initial Impression / Assessment and Plan / ED Course  I have reviewed the triage vital signs and the nursing notes.  Pertinent labs & imaging results that were available during my care of the patient were reviewed by me and considered in my medical decision making (see chart for details).     5:36  AM Pt with long h/o recurrent sigmoid diverticulitis She has had 9 CT Abd/pelvis scans She has never had perforation She has never had operative management She has had previous colonoscopy that confirms diverticulosis Plan at this time includes acute abd series, pain control, start antibiotics and reasses 6:23 AM Pt improved Xray negative Labs reassuring Will try PO challenge 6:58 AM Pt now reports pain is getting worse and she can't go home with this pain She is requesting admission Advised that we must proceed with CT scan if her pain is worsening I advised her that this is her 10th CT abdomen/pelvis D/w dr Roderic Palau to f/u on CT imaging   Final Clinical Impressions(s) / ED Diagnoses   Final diagnoses:  Pain  LLQ abdominal pain    New Prescriptions New Prescriptions   No medications on file     Ripley Fraise, MD 04/27/16 [phone]

## 2016-04-27 NOTE — ED Provider Notes (Signed)
Patient with perforation from diverticulitis. Patient is very stable and is admitted by internal medicine with surgery consult   Milton Ferguson, MD 04/27/16 [phone]

## 2016-04-27 NOTE — ED Notes (Signed)
Pt c/o left side flank pain onset a few days ago gradually getting worse. States nausea but no vomiting, not been eating much and irregular bowel movements. Hx of diverticulitis.

## 2016-04-27 NOTE — ED Triage Notes (Signed)
Patient is complaining of of left flank pain. Patient says that she is nauseated. The left flank pain started a few days.

## 2016-04-28 LAB — CBC
HCT: 34.4 % — ABNORMAL LOW (ref 36.0–46.0)
Hemoglobin: 11.1 g/dL — ABNORMAL LOW (ref 12.0–15.0)
MCH: 26.9 pg (ref 26.0–34.0)
MCHC: 32.3 g/dL (ref 30.0–36.0)
MCV: 83.5 fL (ref 78.0–100.0)
Platelets: 280 10*3/uL (ref 150–400)
RBC: 4.12 MIL/uL (ref 3.87–5.11)
RDW: 14.6 % (ref 11.5–15.5)
WBC: 8.9 10*3/uL (ref 4.0–10.5)

## 2016-04-28 LAB — BASIC METABOLIC PANEL
Anion gap: 5 (ref 5–15)
BUN: 8 mg/dL (ref 6–20)
CO2: 27 mmol/L (ref 22–32)
CREATININE: 0.75 mg/dL (ref 0.44–1.00)
Calcium: 8.9 mg/dL (ref 8.9–10.3)
Chloride: 104 mmol/L (ref 101–111)
Glucose, Bld: 102 mg/dL — ABNORMAL HIGH (ref 65–99)
Potassium: 3.9 mmol/L (ref 3.5–5.1)
Sodium: 136 mmol/L (ref 135–145)

## 2016-04-28 MED ORDER — OXYCODONE HCL 5 MG PO TABS: 10.0000 mg | ORAL_TABLET | Freq: Once | ORAL | Status: DC

## 2016-04-28 MED ORDER — SODIUM CHLORIDE 0.9% FLUSH
10.0000 mL | INTRAVENOUS | Status: DC | PRN
  Administered 2016-04-28: 10 mL
  Filled 2016-04-28: qty 40

## 2016-04-28 MED ORDER — SODIUM CHLORIDE 0.9% FLUSH
10.0000 mL | Freq: Two times a day (BID) | INTRAVENOUS | Status: DC
  Administered 2016-04-28 – 2016-05-01 (×2): 10 mL

## 2016-04-28 NOTE — Progress Notes (Signed)
[facility] Progress Note     Subjective: CC LLQ pain. Pt states pain is the same as yesterday, has not improved. Endorses mild nausea. Denies fever, chills, vomiting, or BM. Also complaining of numbness of RLE.   Issues with IV - has had no access since roughly 1300. PICC line to be placed today. Objective: Vital signs in last 24 hours: Temp:  [98.3 F (36.8 C)-98.6 F (37 C)] 98.3 F (36.8 C) (04/03 0439) Pulse Rate:  [78-85] 78 (04/03 0439) Resp:  [18] 18 (04/03 0439) BP: (140-166)/(78-100) 140/78 (04/03 0439) SpO2:  [98 %-99 %] 99 % (04/03 0439) Last BM Date: 04/26/16  Intake/Output from previous day: 04/02 0701 - 04/03 0700 In: 2971.7 [P.O.:40; I.V.:1281.7; IV Piggyback:1650] Out: -  Intake/Output this shift: No intake/output data recorded.  PE: Gen:  Alert, no acute distress, cooperative - in obvious abdominal discomfort  Card:  Regular rate and rhythm Pulm:  Non-labored, clear to auscultation bilaterally Abd: Soft, TTP LLQ with localized peritonitis, mild distention, hypoactive bowel sounds Ext: sensation of RLE in tact, strength 5/5 in R leg and hip.  Lab Results:   Recent Labs  04/27/16 0505 04/28/16 0340  WBC 7.5 8.9  HGB 12.3 11.1*  HCT 38.2 34.4*  PLT 243 280   BMET  Recent Labs  04/27/16 0505 04/28/16 0340  NA 135 136  K 3.9 3.9  CL 106 104  CO2 21* 27  GLUCOSE 104* 102*  BUN 12 8  CREATININE 0.75 0.75  CALCIUM 8.6* 8.9   PT/INR No results for input(s): LABPROT, INR in the last 72 hours. CMP     Component Value Date/Time   NA 136 04/28/2016 0340   K 3.9 04/28/2016 0340   CL 104 04/28/2016 0340   CO2 27 04/28/2016 0340   GLUCOSE 102 (H) 04/28/2016 0340   BUN 8 04/28/2016 0340   CREATININE 0.75 04/28/2016 0340   CALCIUM 8.9 04/28/2016 0340   PROT 7.0 04/27/2016 0505   ALBUMIN 3.6 04/27/2016 0505   AST 19 04/27/2016 0505   ALT 15 04/27/2016 0505   ALKPHOS 85 04/27/2016 0505   BILITOT 0.4 04/27/2016 0505   GFRNONAA  >60 04/28/2016 0340   GFRAA >60 04/28/2016 0340   Lipase     Component Value Date/Time   LIPASE 22 01/14/2016 1548       Studies/Results: Ct Abdomen Pelvis W Contrast  Result Date: 04/27/2016 CLINICAL DATA:  46 year old female with left flank pain for few days getting worse. History diverticulitis. Prior appendectomy. Subsequent encounter. EXAM: CT ABDOMEN AND PELVIS WITH CONTRAST TECHNIQUE: Multidetector CT imaging of the abdomen and pelvis was performed using the standard protocol following bolus administration of intravenous contrast. CONTRAST:  132mL ISOVUE-300 IOPAMIDOL (ISOVUE-300) INJECTION 61% COMPARISON:  04/27/2016 plain film examination.  01/14/2016 CT. FINDINGS: Exam is motion degraded. Lower chest: Basilar subsegmental atelectasis/ scarring. Heart slightly enlarged. Hepatobiliary: Mild fatty infiltration of the liver without worrisome hepatic lesion. No calcified gallstone. Pancreas: No mass, inflammation or pancreatic duct dilation. Spleen: No mass or enlargement. Adrenals/Urinary Tract: No renal or ureteral obstructing stone or evidence hydronephrosis. No adrenal or worrisome renal mass. Noncontrast filled views of the urinary bladder without gross abnormality. Stomach/Bowel: Diverticulitis proximal sigmoid colon with focal contained perforation anterior inferior aspect. Inflammatory process along the posterior portion of the proximal sigmoid colon infiltrates the mesentery with small amount of fluid extending along the lower left pericolic gutter. Currently, no well-defined drainable abscess. Small amount of fluid within the right pelvis may reflect result  of the inflammatory process or recent ovulation. Evaluation of the bowel at the level of the proximal sigmoid colon diverticulitis is limited secondary to motion, muscular hypertrophy and inflammation. Scattered diverticula seen throughout remainder of the colon. Moderate-size hiatal hernia. Vascular/Lymphatic: No aneurysm or large  vessel occlusion. Small pelvic lymph nodes greater on left may be reactive in origin. Reproductive: Enlarged uterus with heterogeneous appearance suggestive of underlying fibroids. The endometrial lining appears slightly irregular. Findings can be assessed on elective pelvic sonogram after acute episode has clear. There may be a corpus luteum cyst on the right. 2 cm left ovarian cyst/follicle. Other: Slightly asymmetric breast parenchyma incompletely assessed. Fat containing periumbilical hernia without bowel containing hernia. Musculoskeletal: Anterior wedge compression deformity L1 vertebra possibly congenital with superimposed degenerative changes superior endplate. Rudimentary disc at S1-2. IMPRESSION: Exam is motion degraded. Diverticulitis proximal sigmoid colon with focal contained perforation anterior inferior aspect. Inflammatory process along the posterior portion of the proximal sigmoid colon infiltrates the mesentery with small amount of fluid extending along the lower left pericolic gutter. Currently, no well-defined drainable abscess. Small amount of fluid within the right pelvis may reflect result of the inflammatory process or recent ovulation. Moderate hiatal hernia. Enlarged uterus with heterogeneous appearance suggestive of underlying fibroids. The endometrial lining appears slightly irregular. Findings can be assessed on elective pelvic sonogram after acute episode has clear These results were called by telephone at the time of interpretation on 04/27/2016 at 8:28 am to Dr. Roderic Palau who verbally acknowledged these results. Electronically Signed   By: Genia Del M.D.   On: 04/27/2016 08:41   Dg Abd Acute W/chest  Result Date: 04/27/2016 CLINICAL DATA:  LEFT abdominal pain for 2 days, constipation, nausea. History of diverticulitis. EXAM: DG ABDOMEN ACUTE W/ 1V CHEST COMPARISON:  CT abdomen and pelvis January 14, 2016 FINDINGS: Cardiomediastinal silhouette is unremarkable for this low  inspiratory examination with crowded vasculature markings. The lungs are clear without pleural effusions or focal consolidations. Trachea projects midline and there is no pneumothorax. Included soft tissue planes and osseous structures are non-suspicious. Bowel gas pattern is nondilated and nonobstructive. Mild retained large bowel stool. No intra-abdominal mass effect or pathologic calcifications. No intraperitoneal free air. Soft tissue planes included osseous structures are nonsuspicious, large body habitus. IMPRESSION: No acute cardiopulmonary process for this low inspiratory portable examination. Normal bowel gas pattern. Electronically Signed   By: Elon Alas M.D.   On: 04/27/2016 06:11    Anti-infectives: Anti-infectives    Start     Dose/Rate Route Frequency Ordered Stop   04/27/16 1400  metroNIDAZOLE (FLAGYL) IVPB 500 mg     500 mg 100 mL/hr over 60 Minutes Intravenous Every 8 hours 04/27/16 1110     04/27/16 1200  ciprofloxacin (CIPRO) IVPB 400 mg     400 mg 200 mL/hr over 60 Minutes Intravenous Every 12 hours 04/27/16 1110     04/27/16 0515  cefTRIAXone (ROCEPHIN) 2 g in dextrose 5 % 50 mL IVPB     2 g 100 mL/hr over 30 Minutes Intravenous  Once 04/27/16 0514 04/27/16 0605   04/27/16 0515  metroNIDAZOLE (FLAGYL) IVPB 500 mg     500 mg 100 mL/hr over 60 Minutes Intravenous  Once 04/27/16 0514 04/27/16 0650       Assessment/Plan Acute sigmoid diverticulitis, recurrent, with contained perforation - afebrile, no leukocytosis  - PICC placement; continue IVF, bowel rest, and IV abx. If patient still has localized peritonitis after 24 hours of abx then consider switching to IV  Zosyn, though I suspect her sxs will improve with IV access/appropriate medial treatment. - given recurrent nature of the disease, if patient improves with IV antibiotics this admission, would recommend outpatient surgical consultation for elective sigmoid colectomy.  FEN: NPO, IVF ID: cipro/flagyl 4/2  >>  VTE: Lovenox, SCD's  Plan: bowel rest, IV abx Recommend replacing IV cipro with IV Ceftriaxone     LOS: 1 day    Jill Alexanders , [facility] 04/28/2016, 11:42 AM Pager: [phone] Consults: [phone] Mon-Fri 7:00 am-4:30 pm Sat-Sun 7:00 am-11:30 am

## 2016-04-28 NOTE — Progress Notes (Signed)
PROGRESS NOTE   Briana Wade  LFY:[id number]    DOB: 12/22/1970    DOA: 04/27/2016  PCP: PROVIDER NOT IN SYSTEM   I have briefly reviewed patients previous medical records in [facility].  Brief Narrative:  46 year old female with PMH of recurrent sigmoid diverticulitis (4 prior hospitalizations between 2012-2017), HTN, asthma, GERD, CVA, IBS, tubular adenoma of colon, obesity who presented to ED with 2 days history of left lower quadrant abdominal pain similar to prior episodes of sigmoid diverticulitis, nausea and an episode of nonbloody emesis. Her pain was not relieved by NSAIDs taken at home. In the ED, urine pregnancy test negative, UA unremarkable, CT abdomen showed acute sigmoid diverticulitis with contained perforation anteriorly. Admitted for recurrent acute sigmoid diverticulitis. General surgery consulting.   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 Acute sigmoid diverticulitis, recurrent, with contained perforation: Treating supportively with bowel rest/NPO, IV fluids, IV antibiotics (Cipro and Flagyl), pain management. General surgery consultation and follow-up appreciated. As per their input, given the recurrent nature of her disease, if patient improves with IV antibiotics this admission, recommend outpatient surgical consultation for elective sigmoid colectomy. If she has clinical decline with worsening abdominal pain, fever or leukocytosis then may warrant IR consultation for percutaneous drainage of related abscess or surgical intervention. Difficult IV access-requested PICC line 04/28/16. Last colonoscopy 02/22/13: Mild diverticulosis of descending and sigmoid, hemorrhoids, no polyps and no acute findings. 2. Anemia:  Probably dilutional. No reports of bleeding. Follow CBC in a.m. 3. Essential hypertension: Mildly uncontrolled. Continue home losartan. When necessary IV hydralazine. 4. GERD: Not on medications at home. On IV Pepcid while hospitalized. 5. Asthma: Stable without clinical bronchospasm or symptoms. 6. IBS: Not on medications PTA and was seen by [facility] previously. 7. Uterine abnormality on CT abdomen 4/2: CT reported enlarged uterus with heterogeneous appearance suggestive of underlying fibroids. The endometrial lining appears slightly irregular. Radiology recommends elective pelvic sonogram after acute episode has cleared. Urine pregnancy test negative on 04/27/16.   DVT prophylaxis: Lovenox Code Status: Full Family Communication: None at bedside Disposition: DC home when medically improved and stable, possibly in 3-4 days.   Consultants:  General surgery   Procedures:  PICC line requested 4/3  Antimicrobials:  IV Cipro 4/2 > IV metronidazole 4/1 > IV ceftriaxone 1 dose on 4/1    Subjective: Abdominal pain may be slightly better. Continues to complain of moderate, 7/10 left lower quadrant abdominal pain. Overnight nausea with mild nonbloody emesis. Passing flatus. No BM since admission.   ROS: Denies dizziness, lightheadedness, chest pain or dyspnea.  Objective:  Vitals:   04/27/16 0857 04/27/16 1121 04/27/16 2020 04/28/16 0439  BP: 131/89 (!) 141/95 (!) 166/100 140/78  Pulse: 71 68 85 78  Resp: 16 18 18 18   Temp:  97.8 F (36.6 C) 98.6 F (37 C) 98.3 F (36.8 C)  TempSrc:  Oral Oral Oral  SpO2: 100% 96% 98% 99%  Weight:  97.5 kg (215 lb)    Height:  5\' 1"  (1.549 m)      Examination:  General exam: Pleasant young female, moderately built and obese, lying comfortably in bed. Respiratory system: Clear to auscultation. Respiratory effort normal. Cardiovascular system: S1 & S2 heard, RRR. No JVD, murmurs, rubs, gallops  or clicks. No pedal edema. Gastrointestinal  system: Abdomen is nondistended/obese, soft. LLQ tenderness without peritoneal signs. No organomegaly or masses felt. Normal bowel sounds heard. Central nervous system: Alert and oriented. No focal neurological deficits. Extremities: Symmetric 5 x 5 power. Skin: No rashes, lesions or ulcers Psychiatry: Judgement and insight appear normal. Mood & affect appropriate.     Data Reviewed: I have personally reviewed following labs and imaging studies  CBC:  Recent Labs Lab 04/27/16 0505 04/28/16 0340  WBC 7.5 8.9  NEUTROABS 5.3  --   HGB 12.3 11.1*  HCT 38.2 34.4*  MCV 85.8 83.5  PLT 243 644   Basic Metabolic Panel:  Recent Labs Lab 04/27/16 0505 04/28/16 0340  NA 135 136  K 3.9 3.9  CL 106 104  CO2 21* 27  GLUCOSE 104* 102*  BUN 12 8  CREATININE 0.75 0.75  CALCIUM 8.6* 8.9   Liver Function Tests:  Recent Labs Lab 04/27/16 0505  AST 19  ALT 15  ALKPHOS 85  BILITOT 0.4  PROT 7.0  ALBUMIN 3.6   Coagulation Profile: No results for input(s): INR, PROTIME in the last 168 hours. Cardiac Enzymes: No results for input(s): CKTOTAL, CKMB, CKMBINDEX, TROPONINI in the last 168 hours. HbA1C: No results for input(s): HGBA1C in the last 72 hours. CBG: No results for input(s): GLUCAP in the last 168 hours.  No results found for this or any previous visit (from the past 240 hour(s)).       Radiology Studies: Ct Abdomen Pelvis W Contrast  Result Date: 04/27/2016 CLINICAL DATA:  46 year old female with left flank pain for few days getting worse. History diverticulitis. Prior appendectomy. Subsequent encounter. EXAM: CT ABDOMEN AND PELVIS WITH CONTRAST TECHNIQUE: Multidetector CT imaging of the abdomen and pelvis was performed using the standard protocol following bolus administration of intravenous contrast. CONTRAST:  133mL ISOVUE-300 IOPAMIDOL (ISOVUE-300) INJECTION 61% COMPARISON:  04/27/2016 plain film examination.  01/14/2016 CT. FINDINGS: Exam is motion degraded. Lower  chest: Basilar subsegmental atelectasis/ scarring. Heart slightly enlarged. Hepatobiliary: Mild fatty infiltration of the liver without worrisome hepatic lesion. No calcified gallstone. Pancreas: No mass, inflammation or pancreatic duct dilation. Spleen: No mass or enlargement. Adrenals/Urinary Tract: No renal or ureteral obstructing stone or evidence hydronephrosis. No adrenal or worrisome renal mass. Noncontrast filled views of the urinary bladder without gross abnormality. Stomach/Bowel: Diverticulitis proximal sigmoid colon with focal contained perforation anterior inferior aspect. Inflammatory process along the posterior portion of the proximal sigmoid colon infiltrates the mesentery with small amount of fluid extending along the lower left pericolic gutter. Currently, no well-defined drainable abscess. Small amount of fluid within the right pelvis may reflect result of the inflammatory process or recent ovulation. Evaluation of the bowel at the level of the proximal sigmoid colon diverticulitis is limited secondary to motion, muscular hypertrophy and inflammation. Scattered diverticula seen throughout remainder of the colon. Moderate-size hiatal hernia. Vascular/Lymphatic: No aneurysm or large vessel occlusion. Small pelvic lymph nodes greater on left may be reactive in origin. Reproductive: Enlarged uterus with heterogeneous appearance suggestive of underlying fibroids. The endometrial lining appears slightly irregular. Findings can be assessed on elective pelvic sonogram after acute episode has clear. There may be a corpus luteum cyst on the right. 2 cm left ovarian cyst/follicle. Other: Slightly asymmetric breast parenchyma incompletely assessed. Fat containing periumbilical hernia without bowel containing hernia. Musculoskeletal: Anterior wedge compression deformity L1 vertebra possibly congenital with superimposed degenerative changes superior endplate. Rudimentary disc at S1-2. IMPRESSION: Exam is motion  degraded. Diverticulitis proximal  sigmoid colon with focal contained perforation anterior inferior aspect. Inflammatory process along the posterior portion of the proximal sigmoid colon infiltrates the mesentery with small amount of fluid extending along the lower left pericolic gutter. Currently, no well-defined drainable abscess. Small amount of fluid within the right pelvis may reflect result of the inflammatory process or recent ovulation. Moderate hiatal hernia. Enlarged uterus with heterogeneous appearance suggestive of underlying fibroids. The endometrial lining appears slightly irregular. Findings can be assessed on elective pelvic sonogram after acute episode has clear These results were called by telephone at the time of interpretation on 04/27/2016 at 8:28 am to Dr. Roderic Palau who verbally acknowledged these results. Electronically Signed   By: Genia Del M.D.   On: 04/27/2016 08:41   Dg Abd Acute W/chest  Result Date: 04/27/2016 CLINICAL DATA:  LEFT abdominal pain for 2 days, constipation, nausea. History of diverticulitis. EXAM: DG ABDOMEN ACUTE W/ 1V CHEST COMPARISON:  CT abdomen and pelvis January 14, 2016 FINDINGS: Cardiomediastinal silhouette is unremarkable for this low inspiratory examination with crowded vasculature markings. The lungs are clear without pleural effusions or focal consolidations. Trachea projects midline and there is no pneumothorax. Included soft tissue planes and osseous structures are non-suspicious. Bowel gas pattern is nondilated and nonobstructive. Mild retained large bowel stool. No intra-abdominal mass effect or pathologic calcifications. No intraperitoneal free air. Soft tissue planes included osseous structures are nonsuspicious, large body habitus. IMPRESSION: No acute cardiopulmonary process for this low inspiratory portable examination. Normal bowel gas pattern. Electronically Signed   By: Elon Alas M.D.   On: 04/27/2016 06:11        Scheduled Meds: .  ciprofloxacin  400 mg Intravenous Q12H  . enoxaparin (LOVENOX) injection  40 mg Subcutaneous Daily  . famotidine (PEPCID) IV  10 mg Intravenous Q12H  . losartan  50 mg Oral Daily  . metronidazole  500 mg Intravenous Q8H  . oxyCODONE  10 mg Oral Once   Continuous Infusions: . sodium chloride 100 mL/hr at 04/27/16 2233     LOS: 1 day     Loree Shehata, MD, FACP, FHM. [facility] Pager [phone]  If 7PM-7AM, please contact night-coverage [email or web address] Password [facility] 04/28/2016, 12:47 PM

## 2016-04-28 NOTE — Progress Notes (Signed)
Peripherally Inserted Central Catheter/Midline Placement  The IV Nurse has discussed with the patient and/or persons authorized to consent for the patient, the purpose of this procedure and the potential benefits and risks involved with this procedure.  The benefits include less needle sticks, lab draws from the catheter, and the patient may be discharged home with the catheter. Risks include, but not limited to, infection, bleeding, blood clot (thrombus formation), and puncture of an artery; nerve damage and irregular heartbeat and possibility to perform a PICC exchange if needed/ordered by physician.  Alternatives to this procedure were also discussed.  Bard Power PICC patient education guide, fact sheet on infection prevention and patient information card has been provided to patient /or left at bedside.    PICC/Midline Placement Documentation  PICC Single Lumen 21/22/48 PICC Right Basilic 39 cm (Active)  Indication for Insertion or Continuance of Line Poor Vasculature-patient has had multiple peripheral attempts or PIVs lasting less than 24 hours 04/28/2016  2:00 PM  Dressing Change Due 05/05/16 04/28/2016  2:00 PM       Briana Wade 04/28/2016, 2:06 PM

## 2016-04-29 DIAGNOSIS — K219 Gastro-esophageal reflux disease without esophagitis: Secondary | ICD-10-CM

## 2016-04-29 DIAGNOSIS — I10 Essential (primary) hypertension: Secondary | ICD-10-CM

## 2016-04-29 DIAGNOSIS — K5730 Diverticulosis of large intestine without perforation or abscess without bleeding: Secondary | ICD-10-CM

## 2016-04-29 DIAGNOSIS — K5720 Diverticulitis of large intestine with perforation and abscess without bleeding: Principal | ICD-10-CM

## 2016-04-29 DIAGNOSIS — R112 Nausea with vomiting, unspecified: Secondary | ICD-10-CM

## 2016-04-29 LAB — BASIC METABOLIC PANEL
ANION GAP: 7 (ref 5–15)
BUN: 6 mg/dL (ref 6–20)
CALCIUM: 8.5 mg/dL — AB (ref 8.9–10.3)
CO2: 29 mmol/L (ref 22–32)
CREATININE: 0.82 mg/dL (ref 0.44–1.00)
Chloride: 101 mmol/L (ref 101–111)
GFR calc Af Amer: >60 mL/min (ref >60)
GLUCOSE: 88 mg/dL (ref 65–99)
Potassium: 3.2 mmol/L — ABNORMAL LOW (ref 3.5–5.1)
Sodium: 137 mmol/L (ref 135–145)

## 2016-04-29 LAB — CBC
HCT: 33.3 % — ABNORMAL LOW (ref 36.0–46.0)
Hemoglobin: 10.9 g/dL — ABNORMAL LOW (ref 12.0–15.0)
MCH: 27.7 pg (ref 26.0–34.0)
MCHC: 32.7 g/dL (ref 30.0–36.0)
MCV: 84.7 fL (ref 78.0–100.0)
Platelets: 228 10*3/uL (ref 150–400)
RBC: 3.93 MIL/uL (ref 3.87–5.11)
RDW: 14.6 % (ref 11.5–15.5)
WBC: 6.2 10*3/uL (ref 4.0–10.5)

## 2016-04-29 MED ORDER — POTASSIUM CHLORIDE 10 MEQ/100ML IV SOLN
10.0000 meq | INTRAVENOUS | Status: AC
  Administered 2016-04-29 (×3): 10 meq via INTRAVENOUS
  Filled 2016-04-29 (×3): qty 100

## 2016-04-29 MED ORDER — PROMETHAZINE HCL 25 MG/ML IJ SOLN
25.0000 mg | Freq: Four times a day (QID) | INTRAMUSCULAR | Status: DC | PRN
  Administered 2016-04-29 – 2016-05-01 (×2): 25 mg via INTRAVENOUS
  Filled 2016-04-29 (×3): qty 1

## 2016-04-29 MED ORDER — SODIUM CHLORIDE 0.9 % IV SOLN: 30.0000 meq | Freq: Once | INTRAVENOUS | Status: DC

## 2016-04-29 MED ORDER — IPRATROPIUM-ALBUTEROL 0.5-2.5 (3) MG/3ML IN SOLN
3.0000 mL | RESPIRATORY_TRACT | Status: DC | PRN
  Administered 2016-04-29: 3 mL via RESPIRATORY_TRACT

## 2016-04-29 MED ORDER — PIPERACILLIN-TAZOBACTAM 3.375 G IVPB
3.3750 g | Freq: Three times a day (TID) | INTRAVENOUS | Status: DC
  Administered 2016-04-29 – 2016-05-01 (×6): 3.375 g via INTRAVENOUS
  Filled 2016-04-29 (×7): qty 50

## 2016-04-29 NOTE — Progress Notes (Signed)
Patient rested for most of the night. She requested at 0630 feeling nauseated and wanted something for nausea. Zofran 4 mg was administered.

## 2016-04-29 NOTE — Progress Notes (Signed)
PROGRESS NOTE   Briana Wade  GBT:[id number]    DOB: 12-06-1970    DOA: 04/27/2016  PCP: PROVIDER NOT IN SYSTEM   Brief Narrative:  Briana Wade is a 46 year old female with PMH of recurrent sigmoid diverticulitis (4 prior hospitalizations between 2012-2017), HTN, asthma, GERD, CVA, IBS, tubular adenoma of colon, and obesity who presented to ED with 2 days history of left lower quadrant abdominal pain similar to prior episodes of sigmoid diverticulitis, nausea and an episode of nonbloody emesis. Her pain was not relieved by NSAIDs taken at home. In the ED, urine pregnancy test negative, UA unremarkable. CT abdomen showed acute sigmoid diverticulitis with contained perforation anteriorly. Admitted for recurrent acute sigmoid diverticulitis, treated with IV antibiotics. General surgery consulting. Plan is to continue IV antibiotics. If no improvement, will re-image and see if amenable to perc drain. Considering sigmoid colectomy electively as outpatient if improves.   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 - Treating supportively with bowel rest/NPO, IV fluids, analgesics - Agree with switch from cipro/flagyl (E. coli has 20% rate of resistance to FQ's in our community) to zosyn. Will monitor and consider reimaging/IR consult for perc drain if no improvement.  - Appreciate surgery recommendations. Considering elective colectomy as outpatient if acute flare resolves.   Difficult IV access: PICC placed 04/28/2016.   Hypokalemia: Due to poor po and GI  losses.  - Replace and recheck daily.   Anemia: Mild, acute, normocytic. Without clinical bleeding, suspect hemodilution from IVF's.  - Monitor CBC and S/Sxs of bleeding.   Essential hypertension: Mildly uncontrolled with spikes when having emesis.  - Continue home losartan.  - Continue PRN IV hydralazine.  GERD: Not on medications at home.  - On IV Pepcid while hospitalized.  Asthma: Stable without clinical bronchospasm or symptoms. - Monitor  IBS: Not on medications PTA and was seen by [facility] previously.  Uterine abnormality on CT abdomen 4/2: CT reported enlarged uterus with heterogeneous appearance suggestive of underlying fibroids. The endometrial lining appears slightly irregular. UPT negative 4/2.  - Radiology recommends elective pelvic sonogram after acute episode has cleared.   DVT prophylaxis: Lovenox Code Status: Full Family Communication: None at bedside Disposition: DC home when medically improved and stable, possibly in 3-4 days.  Consultants: General surgery   Procedures: PICC Single Lumen Right Basilic Vein 71/06/26   Antimicrobials:  IV Cipro 4/2 > 4/4 IV metronidazole 4/1 > IV ceftriaxone 1 dose on 4/1, started again on 4/4 >   Subjective: Pt with recurrent pain and emesis this morning. Pain somewhat improved, but nausea and vomiting are severe. Still no BM but passing flatus. No chest pain or dyspnea.   Objective: Vitals:   04/28/16 1456 04/28/16 2025 04/29/16 0439 04/29/16 0617  BP: (!) 144/88 (!) 147/84 (!) 145/98 (!) 172/106  Pulse: 72 78 (!) 102 91  Resp: 17 18 20 20   Temp: 97.9 F (36.6 C) 98.7 F (37.1 C) 98.9 F (37.2 C)   TempSrc: Oral Oral Oral   SpO2: 93% 100% 98% 100%  Weight:      Height:       General  exam: Middle aged female, obese, sitting uncomfortably in bed leaning over trash bin. Respiratory system: Clear to auscultation. Respiratory effort normal. Cardiovascular system: RRR, no murmur. No JVD or LE edema.     Gastrointestinal system: Abdomen is nondistended/obese, soft. LLQ tenderness without rebound or guarding. Hypoactive BS.  Central nervous system: Alert and oriented. No focal neurological deficits. Extremities: No deformities. RUE w/PICC in place without erythema or discharge.  Skin: No rashes, lesions or ulcers Psychiatry: Judgement and insight appear normal. Mood & affect appropriate.   CBC:  Recent Labs Lab 04/27/16 0505 04/28/16 0340 04/29/16 0600  WBC 7.5 8.9 6.2  NEUTROABS 5.3  --   --   HGB 12.3 11.1* 10.9*  HCT 38.2 34.4* 33.3*  MCV 85.8 83.5 84.7  PLT 243 280 683   Basic Metabolic Panel:  Recent Labs Lab 04/27/16 0505 04/28/16 0340 04/29/16 0600  NA 135 136 137  K 3.9 3.9 3.2*  CL 106 104 101  CO2 21* 27 29  GLUCOSE 104* 102* 88  BUN 12 8 6   CREATININE 0.75 0.75 0.82  CALCIUM 8.6* 8.9 8.5*   Liver Function Tests:  Recent Labs Lab 04/27/16 0505  AST 19  ALT 15  ALKPHOS 85  BILITOT 0.4  PROT 7.0  ALBUMIN 3.6   Scheduled Meds: . enoxaparin (LOVENOX) injection  40 mg Subcutaneous Daily  . famotidine (PEPCID) IV  10 mg Intravenous Q12H  . losartan  50 mg Oral Daily  . oxyCODONE  10 mg Oral Once  . piperacillin-tazobactam (ZOSYN)  IV  3.375 g Intravenous Q8H  . potassium chloride  10 mEq Intravenous Q1 Hr x 3  . sodium chloride flush  10-40 mL Intracatheter Q12H   Continuous Infusions: . sodium chloride 100 mL/hr at 04/29/16 4196    LOS: 2 days    Vance Gather, MD  [facility] Pager [phone]   If 7PM-7AM, please contact night-coverage [email or web address] Password [email or web address] 04/29/2016, 9:11 AM

## 2016-04-29 NOTE — Progress Notes (Signed)
Blood Pressure elevated 173/106 this morning. This Probation officer administered PRN Lopressor 10 mg will continue to monitor.

## 2016-04-29 NOTE — Progress Notes (Signed)
[facility] Progress Note     Subjective: CC nausea and vomiting. Reports she has been throwing up all morning. States her LLQ abdominal pain is slightly improved. +flatus. Denies BM.  Afebrile, hypertensive Objective: Vital signs in last 24 hours: Temp:  [97.9 F (36.6 C)-98.9 F (37.2 C)] 98.9 F (37.2 C) (04/04 0439) Pulse Rate:  [72-102] 91 (04/04 0617) Resp:  [17-20] 20 (04/04 0617) BP: (144-172)/(84-106) 172/106 (04/04 0617) SpO2:  [93 %-100 %] 100 % (04/04 0617) Last BM Date: 04/26/16  PE: Gen:  Alert, sitting on EOB leaned over trash can, obviously nauseated and uncomfortable Card:  sinus tachycardia  Pulm:  Non-labored, clear to auscultation bilaterally Abd: Soft, tender to light palpation of LLQ with guarding, hypoactive bowel sounds Ext: no erythema, edema, or tenderness  Lab Results:   Recent Labs  04/28/16 0340 04/29/16 0600  WBC 8.9 6.2  HGB 11.1* 10.9*  HCT 34.4* 33.3*  PLT 280 228   BMET  Recent Labs  04/28/16 0340 04/29/16 0600  NA 136 137  K 3.9 3.2*  CL 104 101  CO2 27 29  GLUCOSE 102* 88  BUN 8 6  CREATININE 0.75 0.82  CALCIUM 8.9 8.5*   PT/INR No results for input(s): LABPROT, INR in the last 72 hours. CMP     Component Value Date/Time   NA 137 04/29/2016 0600   K 3.2 (L) 04/29/2016 0600   CL 101 04/29/2016 0600   CO2 29 04/29/2016 0600   GLUCOSE 88 04/29/2016 0600   BUN 6 04/29/2016 0600   CREATININE 0.82 04/29/2016 0600   CALCIUM 8.5 (L) 04/29/2016 0600   PROT 7.0 04/27/2016 0505   ALBUMIN 3.6 04/27/2016 0505   AST 19 04/27/2016 0505   ALT 15 04/27/2016 0505   ALKPHOS 85 04/27/2016 0505   BILITOT 0.4 04/27/2016 0505   GFRNONAA >60 04/29/2016 0600   GFRAA >60 04/29/2016 0600   Lipase     Component Value Date/Time   LIPASE 22 01/14/2016 1548       Studies/Results: Ct Abdomen Pelvis W Contrast  Result Date: 04/27/2016 CLINICAL DATA:  46 year old female with left flank pain for few days getting  worse. History diverticulitis. Prior appendectomy. Subsequent encounter. EXAM: CT ABDOMEN AND PELVIS WITH CONTRAST TECHNIQUE: Multidetector CT imaging of the abdomen and pelvis was performed using the standard protocol following bolus administration of intravenous contrast. CONTRAST:  131mL ISOVUE-300 IOPAMIDOL (ISOVUE-300) INJECTION 61% COMPARISON:  04/27/2016 plain film examination.  01/14/2016 CT. FINDINGS: Exam is motion degraded. Lower chest: Basilar subsegmental atelectasis/ scarring. Heart slightly enlarged. Hepatobiliary: Mild fatty infiltration of the liver without worrisome hepatic lesion. No calcified gallstone. Pancreas: No mass, inflammation or pancreatic duct dilation. Spleen: No mass or enlargement. Adrenals/Urinary Tract: No renal or ureteral obstructing stone or evidence hydronephrosis. No adrenal or worrisome renal mass. Noncontrast filled views of the urinary bladder without gross abnormality. Stomach/Bowel: Diverticulitis proximal sigmoid colon with focal contained perforation anterior inferior aspect. Inflammatory process along the posterior portion of the proximal sigmoid colon infiltrates the mesentery with small amount of fluid extending along the lower left pericolic gutter. Currently, no well-defined drainable abscess. Small amount of fluid within the right pelvis may reflect result of the inflammatory process or recent ovulation. Evaluation of the bowel at the level of the proximal sigmoid colon diverticulitis is limited secondary to motion, muscular hypertrophy and inflammation. Scattered diverticula seen throughout remainder of the colon. Moderate-size hiatal hernia. Vascular/Lymphatic: No aneurysm or large vessel occlusion. Small pelvic lymph nodes greater on  left may be reactive in origin. Reproductive: Enlarged uterus with heterogeneous appearance suggestive of underlying fibroids. The endometrial lining appears slightly irregular. Findings can be assessed on elective pelvic sonogram  after acute episode has clear. There may be a corpus luteum cyst on the right. 2 cm left ovarian cyst/follicle. Other: Slightly asymmetric breast parenchyma incompletely assessed. Fat containing periumbilical hernia without bowel containing hernia. Musculoskeletal: Anterior wedge compression deformity L1 vertebra possibly congenital with superimposed degenerative changes superior endplate. Rudimentary disc at S1-2. IMPRESSION: Exam is motion degraded. Diverticulitis proximal sigmoid colon with focal contained perforation anterior inferior aspect. Inflammatory process along the posterior portion of the proximal sigmoid colon infiltrates the mesentery with small amount of fluid extending along the lower left pericolic gutter. Currently, no well-defined drainable abscess. Small amount of fluid within the right pelvis may reflect result of the inflammatory process or recent ovulation. Moderate hiatal hernia. Enlarged uterus with heterogeneous appearance suggestive of underlying fibroids. The endometrial lining appears slightly irregular. Findings can be assessed on elective pelvic sonogram after acute episode has clear These results were called by telephone at the time of interpretation on 04/27/2016 at 8:28 am to Dr. Roderic Palau who verbally acknowledged these results. Electronically Signed   By: Genia Del M.D.   On: 04/27/2016 08:41    Anti-infectives: Anti-infectives    Start     Dose/Rate Route Frequency Ordered Stop   04/27/16 1400  metroNIDAZOLE (FLAGYL) IVPB 500 mg     500 mg 100 mL/hr over 60 Minutes Intravenous Every 8 hours 04/27/16 1110     04/27/16 1200  ciprofloxacin (CIPRO) IVPB 400 mg     400 mg 200 mL/hr over 60 Minutes Intravenous Every 12 hours 04/27/16 1110     04/27/16 0515  cefTRIAXone (ROCEPHIN) 2 g in dextrose 5 % 50 mL IVPB     2 g 100 mL/hr over 30 Minutes Intravenous  Once 04/27/16 0514 04/27/16 0605   04/27/16 0515  metroNIDAZOLE (FLAGYL) IVPB 500 mg     500 mg 100 mL/hr over 60  Minutes Intravenous  Once 04/27/16 0514 04/27/16 0650       Assessment/Plan Acute sigmoid diverticulitis, recurrent, with contained perforation - afebrile, no leukocytosis  - continue IVF, bowel rest, and IV abx; consider changing abx to Zosyn given patients failure to improve clinically and severity of symptoms  - repeat CT abdomen/pelvis tomorrow to evaluate progression/improvement of LLQ abscess/inflammation. - given recurrent nature of the disease, if patient improves with IV antibiotics this admission, would recommend outpatient surgical consultation for elective sigmoid colectomy.  HTN - given patients intolerance of PO intake, consider staring IV metoprolol; PRN hydralazine.  Anemia GERD Asthma IBS  FEN: NPO, IVF, hypokalemia- replace IV KCl ID: cipro/flagyl 4/2 >>  VTE: Lovenox, SCD's  Plan: bowel rest, IV abx - switch to Zosyn  Replace potassium Phenergan PRN for nausea Repeat CT abd/pelvis tomorrow     LOS: 2 days    Jill Alexanders , [facility] 04/29/2016, 7:56 AM Pager: [phone] Consults: [phone] Mon-Fri 7:00 am-4:30 pm Sat-Sun 7:00 am-11:30 am

## 2016-04-30 LAB — BASIC METABOLIC PANEL
ANION GAP: 6 (ref 5–15)
BUN: 6 mg/dL (ref 6–20)
CALCIUM: 8.6 mg/dL — AB (ref 8.9–10.3)
CO2: 27 mmol/L (ref 22–32)
Chloride: 104 mmol/L (ref 101–111)
Creatinine, Ser: 0.77 mg/dL (ref 0.44–1.00)
GFR calc Af Amer: >60 mL/min (ref >60)
GFR calc non Af Amer: >60 mL/min (ref >60)
GLUCOSE: 80 mg/dL (ref 65–99)
Potassium: 3.1 mmol/L — ABNORMAL LOW (ref 3.5–5.1)
Sodium: 137 mmol/L (ref 135–145)

## 2016-04-30 LAB — CBC
HEMATOCRIT: 34.4 % — AB (ref 36.0–46.0)
HEMOGLOBIN: 11.5 g/dL — AB (ref 12.0–15.0)
MCH: 27.4 pg (ref 26.0–34.0)
MCHC: 33.4 g/dL (ref 30.0–36.0)
MCV: 82.1 fL (ref 78.0–100.0)
Platelets: 260 10*3/uL (ref 150–400)
RBC: 4.19 MIL/uL (ref 3.87–5.11)
RDW: 14.6 % (ref 11.5–15.5)
WBC: 6.7 10*3/uL (ref 4.0–10.5)

## 2016-04-30 MED ORDER — POTASSIUM CHLORIDE CRYS ER 20 MEQ PO TBCR
30.0000 meq | EXTENDED_RELEASE_TABLET | Freq: Once | ORAL | Status: AC
  Administered 2016-04-30: 20:00:00 30 meq via ORAL
  Filled 2016-04-30: qty 1

## 2016-04-30 NOTE — Progress Notes (Signed)
PROGRESS NOTE   JIMYA CIANI  QVZ:[id number]    DOB: 1970-12-23    DOA: 04/27/2016  PCP: PROVIDER NOT IN SYSTEM   Brief Narrative:  Briana Wade is a 46 year old female with PMH of recurrent sigmoid diverticulitis (4 prior hospitalizations between 2012-2017), HTN, asthma, GERD, CVA, IBS, tubular adenoma of colon, and obesity who presented to ED with 2 days history of left lower quadrant abdominal pain similar to prior episodes of sigmoid diverticulitis, nausea and an episode of nonbloody emesis. Her pain was not relieved by NSAIDs taken at home. In the ED, urine pregnancy test negative, UA unremarkable. CT abdomen showed acute sigmoid diverticulitis with contained perforation anteriorly. Admitted for recurrent acute sigmoid diverticulitis, treated with IV antibiotics. General surgery consulting. Plan is to continue IV antibiotics. If no improvement, will re-image and see if amenable to perc drain. Considering sigmoid colectomy electively as outpatient if improves.   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 - Treating supportively, will start clear diet today per surgery. Continue IVF, analgesics for now. - Agree with switch from cipro/flagyl (E. coli has 20% rate of resistance to FQ's in our community) to zosyn made 4/4. Will monitor and consider reimaging/IR consult for perc drain if no improvement.  - Appreciate surgery recommendations. Considering elective colectomy as outpatient if acute flare resolves.   Difficult IV access: PICC placed 04/28/2016.    Hypokalemia: Due to poor po and GI losses.  - Replace and recheck daily.   Anemia: Mild, acute, normocytic. Without clinical bleeding, suspect hemodilution from IVF's.  - Monitor CBC and S/Sxs of bleeding.   Essential hypertension: Mildly uncontrolled with spikes when having emesis.  - Continue home losartan.  - Continue PRN IV hydralazine.  GERD: Not on medications at home.  - On IV Pepcid while hospitalized.  Asthma: Stable without clinical bronchospasm or symptoms. - Monitor  IBS: Not on medications PTA and was seen by [facility] previously.  Uterine abnormality on CT abdomen 4/2: CT reported enlarged uterus with heterogeneous appearance suggestive of underlying fibroids. The endometrial lining appears slightly irregular. UPT negative 4/2.  - Radiology recommends elective pelvic sonogram after acute episode has cleared.   DVT prophylaxis: Lovenox Code Status: Full Family Communication: None at bedside Disposition: DC home when medically improved and stable, possibly in 1-2 days.  Consultants: General surgery   Procedures: PICC Single Lumen Right Basilic Vein 32/95/18   Antimicrobials:  IV Cipro 4/2 > 4/4 IV metronidazole 4/1 > 4/4 IV ceftriaxone 1 dose on 4/1  Zosyn 4/4 >>   Subjective: Pt with much improvement in symptoms in the past 24 hours. Nausea and pain are improved, requesting diet. Passing flatus. No chest pain or dyspnea.   Objective: Vitals:   04/29/16 0921 04/29/16 1409 04/29/16 2034 04/30/16 0440  BP:  (!) 166/109 (!) 157/93 (!) 145/81  Pulse: 83 (!) 106 83 77  Resp: 18 18 18 18   Temp:  97.5 F (36.4 C) 99.2 F (37.3 C) 98.9 F (37.2 C)  TempSrc:  Oral Oral Oral  SpO2: 96% 99% 100% 100%  Weight:      Height:  General exam: Pleasant, obese female in no distress, ambulating. Respiratory system: Clear to auscultation. Respiratory effort normal. Cardiovascular system: RRR, no murmur. No JVD or LE edema.   Gastrointestinal system: Abdomen  is nondistended/obese, soft. LLQ tenderness without rebound or guarding. Normoactive BS.  Central nervous system: Alert and oriented. No focal neurological deficits. Extremities: No deformities. RUE w/PICC in place without erythema or discharge.  Skin: No rashes, lesions or ulcers Psychiatry: Judgement and insight appear normal. Mood & affect appropriate.   CBC:  Recent Labs Lab 04/27/16 0505 04/28/16 0340 04/29/16 0600  WBC 7.5 8.9 6.2  NEUTROABS 5.3  --   --   HGB 12.3 11.1* 10.9*  HCT 38.2 34.4* 33.3*  MCV 85.8 83.5 84.7  PLT 243 280 817   Basic Metabolic Panel:  Recent Labs Lab 04/27/16 0505 04/28/16 0340 04/29/16 0600  NA 135 136 137  K 3.9 3.9 3.2*  CL 106 104 101  CO2 21* 27 29  GLUCOSE 104* 102* 88  BUN 12 8 6   CREATININE 0.75 0.75 0.82  CALCIUM 8.6* 8.9 8.5*   Liver Function Tests:  Recent Labs Lab 04/27/16 0505  AST 19  ALT 15  ALKPHOS 85  BILITOT 0.4  PROT 7.0  ALBUMIN 3.6   Scheduled Meds: . enoxaparin (LOVENOX) injection  40 mg Subcutaneous Daily  . famotidine (PEPCID) IV  10 mg Intravenous Q12H  . losartan  50 mg Oral Daily  . oxyCODONE  10 mg Oral Once  . piperacillin-tazobactam (ZOSYN)  IV  3.375 g Intravenous Q8H  . sodium chloride flush  10-40 mL Intracatheter Q12H   Continuous Infusions: . sodium chloride 50 mL/hr at 04/30/16 0811    LOS: 3 days    Vance Gather, MD  [facility] Pager [phone]   If 7PM-7AM, please contact night-coverage [email or web address] Password [facility] 04/30/2016, 12:57 PM

## 2016-04-30 NOTE — Progress Notes (Signed)
[facility] Progress Note     Subjective: CC recurrent nature of her diverticulitis. Reports yesterday she was exhausted and slept nearly all day. Feels much better today - denies abdominal pain at rest and nausea/vomiting has resolved. Denies fever/chills. +flatus. Denies BM. Requesting to drink something.  Objective: Vital signs in last 24 hours: Temp:  [97.5 F (36.4 C)-99.2 F (37.3 C)] 98.9 F (37.2 C) (04/05 0440) Pulse Rate:  [77-106] 77 (04/05 0440) Resp:  [18] 18 (04/05 0440) BP: (145-166)/(81-109) 145/81 (04/05 0440) SpO2:  [96 %-100 %] 100 % (04/05 0440) Last BM Date: 04/26/16  Intake/Output from previous day: No intake/output data recorded. Intake/Output this shift: No intake/output data recorded.  PE: Gen:  Alert, NAD, pleasant Card:  Regular rate and rhythm, pedal pulses 2+ BL Pulm:  Normal respiratory effort, clear to auscultation bilaterally Abd: Soft, mild tenderness to deep palpations of LLQ without guarding or peritonitis, bowel sounds present in all 4 quadrants Ext:  No erythema, edema, or tenderness  Lab Results:   Recent Labs  04/28/16 0340 04/29/16 0600  WBC 8.9 6.2  HGB 11.1* 10.9*  HCT 34.4* 33.3*  PLT 280 228   BMET  Recent Labs  04/28/16 0340 04/29/16 0600  NA 136 137  K 3.9 3.2*  CL 104 101  CO2 27 29  GLUCOSE 102* 88  BUN 8 6  CREATININE 0.75 0.82  CALCIUM 8.9 8.5*   CMP     Component Value Date/Time   NA 137 04/29/2016 0600   K 3.2 (L) 04/29/2016 0600   CL 101 04/29/2016 0600   CO2 29 04/29/2016 0600   GLUCOSE 88 04/29/2016 0600   BUN 6 04/29/2016 0600   CREATININE 0.82 04/29/2016 0600   CALCIUM 8.5 (L) 04/29/2016 0600   PROT 7.0 04/27/2016 0505   ALBUMIN 3.6 04/27/2016 0505   AST 19 04/27/2016 0505   ALT 15 04/27/2016 0505   ALKPHOS 85 04/27/2016 0505   BILITOT 0.4 04/27/2016 0505   GFRNONAA >60 04/29/2016 0600   GFRAA >60 04/29/2016 0600   Lipase     Component Value Date/Time   LIPASE 22  01/14/2016 1548   Studies/Results: No results found.  Anti-infectives: Anti-infectives    Start     Dose/Rate Route Frequency Ordered Stop   04/29/16 1000  piperacillin-tazobactam (ZOSYN) IVPB 3.375 g     3.375 g 12.5 mL/hr over 240 Minutes Intravenous Every 8 hours 04/29/16 0838     04/27/16 1400  metroNIDAZOLE (FLAGYL) IVPB 500 mg  Status:  Discontinued     500 mg 100 mL/hr over 60 Minutes Intravenous Every 8 hours 04/27/16 1110 04/29/16 0838   04/27/16 1200  ciprofloxacin (CIPRO) IVPB 400 mg  Status:  Discontinued     400 mg 200 mL/hr over 60 Minutes Intravenous Every 12 hours 04/27/16 1110 04/29/16 0838   04/27/16 0515  cefTRIAXone (ROCEPHIN) 2 g in dextrose 5 % 50 mL IVPB     2 g 100 mL/hr over 30 Minutes Intravenous  Once 04/27/16 0514 04/27/16 0605   04/27/16 0515  metroNIDAZOLE (FLAGYL) IVPB 500 mg     500 mg 100 mL/hr over 60 Minutes Intravenous  Once 04/27/16 0514 04/27/16 0650     Assessment/Plan Acute sigmoid diverticulitis, recurrent, with contained perforation - afebrile, no leukocytosis  - clinically improving, less abdominal pain no nausea - start clear liquids  - continue IV abx - given recurrent nature of the disease, if patient improves with IV antibiotics this admission, would recommend outpatient surgical  consultation for elective sigmoid colectomy.  HTN - given patients intolerance of PO intake, consider staring IV metoprolol; PRN hydralazine.  Anemia GERD Asthma IBS  FEN: trial of clear liquids, hypokalemia - replaced and BMET this AM pending. ID: cipro/flagyl 4/2 - 4/4, Zosyn 4/4 >> VTE: Lovenox, SCD's  Plan: clinically improving - start clears and continue IV abx.  I think it is reasonable to hold off on a repeat CT abd/pelvis for now    LOS: 3 days    Jill Alexanders , [facility] Surgery 04/30/2016, 8:05 AM Pager: [phone] Consults: [phone] Mon-Fri 7:00 am-4:30 pm Sat-Sun 7:00 am-11:30 am

## 2016-05-01 DIAGNOSIS — K5792 Diverticulitis of intestine, part unspecified, without perforation or abscess without bleeding: Secondary | ICD-10-CM

## 2016-05-01 LAB — CBC
HCT: 35.6 % — ABNORMAL LOW (ref 36.0–46.0)
Hemoglobin: 11.7 g/dL — ABNORMAL LOW (ref 12.0–15.0)
MCH: 27.9 pg (ref 26.0–34.0)
MCHC: 32.9 g/dL (ref 30.0–36.0)
MCV: 85 fL (ref 78.0–100.0)
PLATELETS: 267 10*3/uL (ref 150–400)
RBC: 4.19 MIL/uL (ref 3.87–5.11)
RDW: 14.7 % (ref 11.5–15.5)
WBC: 5.3 10*3/uL (ref 4.0–10.5)

## 2016-05-01 LAB — BASIC METABOLIC PANEL
Anion gap: 7 (ref 5–15)
BUN: 5 mg/dL — AB (ref 6–20)
CALCIUM: 8.8 mg/dL — AB (ref 8.9–10.3)
CO2: 27 mmol/L (ref 22–32)
CREATININE: 0.9 mg/dL (ref 0.44–1.00)
Chloride: 105 mmol/L (ref 101–111)
Glucose, Bld: 103 mg/dL — ABNORMAL HIGH (ref 65–99)
Potassium: 3.3 mmol/L — ABNORMAL LOW (ref 3.5–5.1)
SODIUM: 139 mmol/L (ref 135–145)

## 2016-05-01 MED ORDER — AMOXICILLIN-POT CLAVULANATE 875-125 MG PO TABS
1.0000 | ORAL_TABLET | Freq: Two times a day (BID) | ORAL | Status: DC
  Administered 2016-05-01 – 2016-05-02 (×3): 1 via ORAL
  Filled 2016-05-01 (×3): qty 1

## 2016-05-01 NOTE — Progress Notes (Signed)
[facility] Progress Note     Subjective: Cc recurrent diverticulitis. Denies abdominal pain at rest. Denies fever, chills, nausea, or vomiting. States the clear liquid diet did not cause her pain to increase. Had a bowel movement yesterday and today that she described as normal and brown. Requesting to go home.   Objective: Vital signs in last 24 hours: Temp:  [98.2 F (36.8 C)-99.2 F (37.3 C)] 98.6 F (37 C) (04/06 0411) Pulse Rate:  [77-96] 77 (04/06 0411) Resp:  [15-16] 15 (04/06 0411) BP: (125-155)/(74-108) 125/74 (04/06 0411) SpO2:  [97 %-100 %] 97 % (04/06 0411) Last BM Date: 04/30/16  Intake/Output from previous day: 04/05 0701 - 04/06 0700 In: 1368.3 [P.O.:120; I.V.:1098.3; IV Piggyback:150] Out: -  Intake/Output this shift: No intake/output data recorded.  PE: Gen:  Alert, NAD, pleasant Card:  Regular rate and rhythm, pedal pulses 2+ BL Pulm:  Normal respiratory effort, clear to auscultation bilaterally Abd: Soft, mild tenderness to deep palpation of LLQ without guarding or peritonitis, bowel sounds present in all 4 quadrants, reducible umbilical hernia. Ext:  No erythema, edema, or tenderness   Lab Results:   Recent Labs  04/30/16 1730 05/01/16 0605  WBC 6.7 5.3  HGB 11.5* 11.7*  HCT 34.4* 35.6*  PLT 260 267   BMET  Recent Labs  04/30/16 1730 05/01/16 0605  NA 137 139  K 3.1* 3.3*  CL 104 105  CO2 27 27  GLUCOSE 80 103*  BUN 6 5*  CREATININE 0.77 0.90  CALCIUM 8.6* 8.8*   PT/INR No results for input(s): LABPROT, INR in the last 72 hours. CMP     Component Value Date/Time   NA 139 05/01/2016 0605   K 3.3 (L) 05/01/2016 0605   CL 105 05/01/2016 0605   CO2 27 05/01/2016 0605   GLUCOSE 103 (H) 05/01/2016 0605   BUN 5 (L) 05/01/2016 0605   CREATININE 0.90 05/01/2016 0605   CALCIUM 8.8 (L) 05/01/2016 0605   PROT 7.0 04/27/2016 0505   ALBUMIN 3.6 04/27/2016 0505   AST 19 04/27/2016 0505   ALT 15 04/27/2016 0505   ALKPHOS  85 04/27/2016 0505   BILITOT 0.4 04/27/2016 0505   GFRNONAA >60 05/01/2016 0605   GFRAA >60 05/01/2016 0605   Lipase     Component Value Date/Time   LIPASE 22 01/14/2016 1548       Studies/Results: No results found.  Anti-infectives: Anti-infectives    Start     Dose/Rate Route Frequency Ordered Stop   04/29/16 1000  piperacillin-tazobactam (ZOSYN) IVPB 3.375 g     3.375 g 12.5 mL/hr over 240 Minutes Intravenous Every 8 hours 04/29/16 0838     04/27/16 1400  metroNIDAZOLE (FLAGYL) IVPB 500 mg  Status:  Discontinued     500 mg 100 mL/hr over 60 Minutes Intravenous Every 8 hours 04/27/16 1110 04/29/16 0838   04/27/16 1200  ciprofloxacin (CIPRO) IVPB 400 mg  Status:  Discontinued     400 mg 200 mL/hr over 60 Minutes Intravenous Every 12 hours 04/27/16 1110 04/29/16 0838   04/27/16 0515  cefTRIAXone (ROCEPHIN) 2 g in dextrose 5 % 50 mL IVPB     2 g 100 mL/hr over 30 Minutes Intravenous  Once 04/27/16 0514 04/27/16 0605   04/27/16 0515  metroNIDAZOLE (FLAGYL) IVPB 500 mg     500 mg 100 mL/hr over 60 Minutes Intravenous  Once 04/27/16 0514 04/27/16 0650     Assessment/Plan Acute sigmoid diverticulitis, recurrent, with contained perforation - afebrile, no  leukocytosis  - clinically improving, less abdominal pain no nausea - advance diet as tolerated - transition to PO abx  - given recurrent nature of the disease, if patient improves with IV antibiotics this admission, would recommend outpatient surgical consultation for elective sigmoid colectomy.  HTN   Anemia GERD Asthma IBS  FEN: full liquids, hypokalemia ID: cipro/flagyl 4/2 - 4/4, Zosyn 4/4 - 4/6, Augmentin 4/6 >> VTE: Lovenox, SCD's  Plan: clinically improving. Advance diet as tolerated and transition to PO abx. Anticipate patient will be stable for discharge tomorrow morning.     LOS: 4 days    [facility] 05/01/2016, 8:01 AM Pager: [phone] Consults:  [phone] Mon-Fri 7:00 am-4:30 pm Sat-Sun 7:00 am-11:30 am

## 2016-05-01 NOTE — Progress Notes (Signed)
PROGRESS NOTE   Briana Wade  IOX:[id number]    DOB: 1970-10-19    DOA: 04/27/2016  PCP: PROVIDER NOT IN SYSTEM   Brief Narrative:  Briana Wade is a 46 year old female with PMH of recurrent sigmoid diverticulitis (4 prior hospitalizations between 2012-2017), HTN, asthma, GERD, CVA, IBS, tubular adenoma of colon, and obesity who presented to ED with 2 days history of left lower quadrant abdominal pain similar to prior episodes of sigmoid diverticulitis, nausea and an episode of nonbloody emesis. Her pain was not relieved by NSAIDs taken at home. In the ED, urine pregnancy test negative, UA unremarkable. CT abdomen showed acute sigmoid diverticulitis with contained perforation anteriorly. Admitted for recurrent acute sigmoid diverticulitis, treated with IV antibiotics. General surgery consulting. Plan is to continue IV antibiotics. If no improvement, will re-image and see if amenable to perc drain. Considering sigmoid colectomy electively as outpatient if improves.   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  Continue IVF, analgesics  Advance diet as tolerated  Surgery f/u for Elective colectomy as outpatient   Hypokalemia: Due to poor po and GI losses.  Replete and f/u  Anemia:  Mild, acute, normocytic.  Without clinical bleeding, suspect hemodilution from IVF's.  Monitor CBC and S/Sxs of bleeding.   Essential hypertension: Mildly uncontrolled with spikes when having emesis.  - Continue home losartan.  - Continue PRN IV  hydralazine.  Uterine fibromyomata,:  CT reported enlarged uterus with heterogeneous appearance suggestive of underlying fibroids. Outpatient Gyn evaluation by PCP   DVT prophylaxis: Lovenox Code Status: Full Family Communication: None at bedside Disposition: DC home possibly in the morning Consultants: General surgery   Procedures: PICC Single Lumen Right Basilic Vein 26/83/41   Antimicrobials:  IV Cipro 4/2 > 4/4 IV metronidazole 4/1 > 4/4 IV ceftriaxone 1 dose on 4/1  Zosyn 4/4 >>  Augmentin 4/6 Subjective: Nausea vomiting better, tolerating oral intake-diet to advance as tolerated  Objective: Vitals:   04/30/16 0440 04/30/16 1417 04/30/16 2100 05/01/16 0411  BP: (!) 145/81 (!) 155/108 130/80 125/74  Pulse: 77 96 84 77  Resp: 18  16 15   Temp: 98.9 F (37.2 C) 98.2 F (36.8 C) 99.2 F (37.3 C) 98.6 F (37 C)  TempSrc: Oral Oral Oral Oral  SpO2: 100% 100% 99% 97%  Weight:      Height:       General exam: Pleasant, obese female in no distress, ambulating. Respiratory system: Clear to auscultation. Respiratory effort normal. Cardiovascular system: RRR, no murmur. No JVD or LE edema.   Gastrointestinal system: Abdomen is nondistended/obese, soft. LLQ tenderness without rebound or guarding. Normoactive BS.  Central nervous system: Alert and oriented. No focal neurological deficits. Extremities: No deformities. RUE w/PICC in place without erythema or discharge.  Skin: No rashes, lesions or ulcers Psychiatry: Judgement and insight appear normal. Mood & affect appropriate.   CBC:  Recent Labs Lab 04/27/16 0505 04/28/16 0340 04/29/16 0600 04/30/16 1730 05/01/16 0605  WBC 7.5 8.9 6.2 6.7 5.3  NEUTROABS 5.3  --   --   --   --   HGB 12.3 11.1* 10.9*  11.5* 11.7*  HCT 38.2 34.4* 33.3* 34.4* 35.6*  MCV 85.8 83.5 84.7 82.1 85.0  PLT 243 280 228 260 871   Basic Metabolic Panel:  Recent Labs Lab 04/27/16 0505 04/28/16 0340 04/29/16 0600 04/30/16 1730  05/01/16 0605  NA 135 136 137 137 139  K 3.9 3.9 3.2* 3.1* 3.3*  CL 106 104 101 104 105  CO2 21* 27 29 27 27   GLUCOSE 104* 102* 88 80 103*  BUN 12 8 6 6  5*  CREATININE 0.75 0.75 0.82 0.77 0.90  CALCIUM 8.6* 8.9 8.5* 8.6* 8.8*   Liver Function Tests:  Recent Labs Lab 04/27/16 0505  AST 19  ALT 15  ALKPHOS 85  BILITOT 0.4  PROT 7.0  ALBUMIN 3.6   Scheduled Meds: . amoxicillin-clavulanate  1 tablet Oral Q12H  . enoxaparin (LOVENOX) injection  40 mg Subcutaneous Daily  . famotidine (PEPCID) IV  10 mg Intravenous Q12H  . losartan  50 mg Oral Daily  . oxyCODONE  10 mg Oral Once  . sodium chloride flush  10-40 mL Intracatheter Q12H   Continuous Infusions: . sodium chloride 50 mL/hr at 05/01/16 0401    LOS: 4 days    OSEI-BONSU,Michele Kerlin, MD  [facility] Pager [phone]   If 7PM-7AM, please contact night-coverage [email or web address] Password [email or web address] 05/01/2016, 9:10 AM

## 2016-05-02 LAB — BASIC METABOLIC PANEL
Anion gap: 7 (ref 5–15)
BUN: 6 mg/dL (ref 6–20)
CHLORIDE: 104 mmol/L (ref 101–111)
CO2: 28 mmol/L (ref 22–32)
CREATININE: 0.76 mg/dL (ref 0.44–1.00)
Calcium: 8.6 mg/dL — ABNORMAL LOW (ref 8.9–10.3)
GFR calc Af Amer: >60 mL/min (ref >60)
GFR calc non Af Amer: >60 mL/min (ref >60)
Glucose, Bld: 105 mg/dL — ABNORMAL HIGH (ref 65–99)
Potassium: 3.3 mmol/L — ABNORMAL LOW (ref 3.5–5.1)
SODIUM: 139 mmol/L (ref 135–145)

## 2016-05-02 LAB — CBC
HEMATOCRIT: 34.6 % — AB (ref 36.0–46.0)
Hemoglobin: 11.2 g/dL — ABNORMAL LOW (ref 12.0–15.0)
MCH: 27.4 pg (ref 26.0–34.0)
MCHC: 32.4 g/dL (ref 30.0–36.0)
MCV: 84.6 fL (ref 78.0–100.0)
PLATELETS: 246 10*3/uL (ref 150–400)
RBC: 4.09 MIL/uL (ref 3.87–5.11)
RDW: 14.7 % (ref 11.5–15.5)
WBC: 5.2 10*3/uL (ref 4.0–10.5)

## 2016-05-02 MED ORDER — POTASSIUM CHLORIDE ER 10 MEQ PO TBCR: 10.0000 meq | EXTENDED_RELEASE_TABLET | Freq: Every day | ORAL | 0 refills | Status: DC

## 2016-05-02 MED ORDER — AMOXICILLIN-POT CLAVULANATE 875-125 MG PO TABS: 1.0000 | ORAL_TABLET | Freq: Two times a day (BID) | ORAL | 0 refills | Status: DC

## 2016-05-02 NOTE — Discharge Summary (Signed)
Briana Wade, is a 46 y.o. female  DOB [birth year]  MRN [id number].  Admission date:  04/27/2016  Admitting Physician  No admitting provider for patient encounter.  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 Abdominal pain, unspecified site   . Allergy   . Anemia   . Asthma   . Chlamydia   . Diverticulitis   . Esophageal reflux   . Hiatal hernia   . Hypertension   . IBS (irritable bowel syndrome)   . Insomnia   . Lactose intolerance   . Migraine   . Other constipation   . Personal history of colonic polyps 09/12/2010   tubular adenoma  . Stroke [facility]) 2014   minor right srm weakness admitted to [facility]  . Trichimoniasis   . Tubular adenoma of colon   . Umbilical hernia   . Uterine fibroid     Past Surgical History:  Procedure Laterality Date  . APPENDECTOMY    . APPENDECTOMY    . COLONOSCOPY    . DILATION AND CURETTAGE OF UTERUS     x2 for EAB  . HYSTEROSCOPY WITH NOVASURE N/A 12/17/2015   Procedure: HYSTEROSCOPY WITH NOVASURE;  Surgeon: Aletha Halim, MD;  Location: [facility];  Service:  Gynecology;  Laterality: N/A;  . TUBAL LIGATION         HPI  from the history and physical done on the day of admission:    Briana Wade  is a 46 y.o. female, With history of IBS, recurrent sigmoid diverticulitis, asthma, GERD, tubular adenoma of colon, uterine fibroid status post in the mitral ablation in November 2017, obesity who presented to the ED with 2 day history of dull left lower quadrant pain. Patient reports symptoms to be similar to her prior sigmoid diverticulitis. Patient has had multiple history of sigmoid diverticulitis with numerous CT imaging over the past 5-6 years. Patient reported being in usual state of health when she developed sudden onset of dull left lower quadrant abdominal pain, nonradiating, which was progressive with nausea and one episode of vomiting yesterday. The pain was 10/10 in severity aggravated on movement and attempting to pass flatus or have bowel movement not relieved by NSAIDs to see to get home. Patient denies any fevers or chills, headache, blurred vision, chest pain,  palpitations, shortness of breath, dysuria or diarrhea. Last bowel movement was yesterday. Patient has been able to pass flatus. She is unsure if she has abdominal distention. She denies any new medications or any over-the-counter supplement. Denies any sick contact or recent travel.  course in the ED Vitals stable except for elevated blood pressure. Blood work showed normal CBC and electrolytes. LFTs and Lactic acid was normal. UA was unremarkable. Urine pregnancy test was negative. A CT scan of the abdomen and pelvis was done which again showed acute sigmoid diverticulitis with contained perforation anteriorly. Patient received 1 L IV normal saline bolus followed by IV Zofran 2, IV fentanyl (total 125 g) and 1 mg IV Dilaudid with minimal relief in symptoms). She was also given IV Rocephin and Flagyl.   Hospital Course:    Admitted for recurrent acute sigmoid diverticulitis and  treated with IV antibiotics, supportive car. General surgery consultants followed along in hospital. She improved and tolerated oral intakes adequately with  plan to continue oral antibiotics with out-pt surgery f/u to consider sigmoid colectomy electively.   #1 Acute sigmoid diverticulitis, recurrent, with contained perforation:   Last colonoscopy 02/22/13: Mild diverticulosis of descending and sigmoid, hemorrhoids, no polyps and no acute findings.   Advance diet as tolerated  Surgery f/u for Elective colectomy as outpatient   Hypokalemia: Due to poor po and GI losses.  Clinical f/u as out-patient  Anemia:  Mild, acute, normocytic.  Without clinical bleeding, suspect hemodilution from IVF's.  Monitor CBC and S/Sxs of bleeding.   Essential hypertension: Was Mildly uncontrolled with spikes when having emesis.  - Continue home medication(s) Uterine fibromyomata,:  CT reported enlarged uterus with heterogeneous appearance suggestive of underlying fibroids. Outpatient Gyn evaluation by PCP      Discharge Condition: stable  Follow UP  Follow-up Information    MARTIN,MATTHEW B, MD. Schedule an appointment as soon as possible for a visit in 2 week(s).   Specialty:  General Surgery Contact information: [address] [place] [address] [phone]            Consults obtained - Gen Surgery  Diet and Activity recommendation:  As advised  Discharge Instructions     Discharge Instructions    Call MD for:  extreme fatigue    Complete by:  As directed    Call MD for:  persistant dizziness or light-headedness    Complete by:  As directed    Call MD for:  persistant nausea and vomiting    Complete by:  As directed    Call MD for:  redness, tenderness, or signs of infection (pain, swelling, redness, odor or green/yellow discharge around incision site)    Complete by:  As directed    Call MD for:  severe uncontrolled pain    Complete by:  As directed     Call MD for:  temperature >100.4    Complete by:  As directed    Diet - low sodium heart healthy    Complete by:  As directed    Increase activity slowly    Complete by:  As directed         Discharge Medications     Allergies as of 05/02/2016   No Known Allergies     Medication List    STOP taking these medications   ibuprofen 200 MG tablet Commonly known as:  MOTRIN IB     TAKE these medications   amoxicillin-clavulanate 875-125 MG tablet Commonly known as:  AUGMENTIN  Take 1 tablet by mouth every 12 (twelve) hours.   losartan 50 MG tablet Commonly known as:  COZAAR Take 50 mg by mouth daily.   metroNIDAZOLE 500 MG tablet Commonly known as:  FLAGYL Take 1 tablet (500 mg total) by mouth every 8 (eight) hours.   potassium chloride 10 MEQ tablet Commonly known as:  K-DUR Take 1 tablet (10 mEq total) by mouth daily.       Major procedures and Radiology Reports    Ct Abdomen Pelvis W Contrast  Result Date: 04/27/2016 CLINICAL DATA:  45 year old female with left flank pain for few days getting worse. History diverticulitis. Prior appendectomy. Subsequent encounter. EXAM: CT ABDOMEN AND PELVIS WITH CONTRAST TECHNIQUE: Multidetector CT imaging of the abdomen and pelvis was performed using the standard protocol following bolus administration of intravenous contrast. CONTRAST:  113mL ISOVUE-300 IOPAMIDOL (ISOVUE-300) INJECTION 61% COMPARISON:  04/27/2016 plain film examination.  01/14/2016 CT. FINDINGS: Exam is motion degraded. Lower chest: Basilar subsegmental atelectasis/ scarring. Heart slightly enlarged. Hepatobiliary: Mild fatty infiltration of the liver without worrisome hepatic lesion. No calcified gallstone. Pancreas: No mass, inflammation or pancreatic duct dilation. Spleen: No mass or enlargement. Adrenals/Urinary Tract: No renal or ureteral obstructing stone or evidence hydronephrosis. No adrenal or worrisome renal mass. Noncontrast filled views of the urinary bladder  without gross abnormality. Stomach/Bowel: Diverticulitis proximal sigmoid colon with focal contained perforation anterior inferior aspect. Inflammatory process along the posterior portion of the proximal sigmoid colon infiltrates the mesentery with small amount of fluid extending along the lower left pericolic gutter. Currently, no well-defined drainable abscess. Small amount of fluid within the right pelvis may reflect result of the inflammatory process or recent ovulation. Evaluation of the bowel at the level of the proximal sigmoid colon diverticulitis is limited secondary to motion, muscular hypertrophy and inflammation. Scattered diverticula seen throughout remainder of the colon. Moderate-size hiatal hernia. Vascular/Lymphatic: No aneurysm or large vessel occlusion. Small pelvic lymph nodes greater on left may be reactive in origin. Reproductive: Enlarged uterus with heterogeneous appearance suggestive of underlying fibroids. The endometrial lining appears slightly irregular. Findings can be assessed on elective pelvic sonogram after acute episode has clear. There may be a corpus luteum cyst on the right. 2 cm left ovarian cyst/follicle. Other: Slightly asymmetric breast parenchyma incompletely assessed. Fat containing periumbilical hernia without bowel containing hernia. Musculoskeletal: Anterior wedge compression deformity L1 vertebra possibly congenital with superimposed degenerative changes superior endplate. Rudimentary disc at S1-2. IMPRESSION: Exam is motion degraded. Diverticulitis proximal sigmoid colon with focal contained perforation anterior inferior aspect. Inflammatory process along the posterior portion of the proximal sigmoid colon infiltrates the mesentery with small amount of fluid extending along the lower left pericolic gutter. Currently, no well-defined drainable abscess. Small amount of fluid within the right pelvis may reflect result of the inflammatory process or recent ovulation.  Moderate hiatal hernia. Enlarged uterus with heterogeneous appearance suggestive of underlying fibroids. The endometrial lining appears slightly irregular. Findings can be assessed on elective pelvic sonogram after acute episode has clear These results were called by telephone at the time of interpretation on 04/27/2016 at 8:28 am to Dr. Roderic Palau who verbally acknowledged these results. Electronically Signed   By: Genia Del M.D.   On: 04/27/2016 08:41   Dg Abd Acute W/chest  Result Date: 04/27/2016 CLINICAL DATA:  LEFT abdominal pain for 2 days, constipation, nausea. History of diverticulitis. EXAM: DG ABDOMEN ACUTE W/ 1V CHEST COMPARISON:  CT abdomen and pelvis January 14, 2016 FINDINGS: Cardiomediastinal silhouette is unremarkable for this  low inspiratory examination with crowded vasculature markings. The lungs are clear without pleural effusions or focal consolidations. Trachea projects midline and there is no pneumothorax. Included soft tissue planes and osseous structures are non-suspicious. Bowel gas pattern is nondilated and nonobstructive. Mild retained large bowel stool. No intra-abdominal mass effect or pathologic calcifications. No intraperitoneal free air. Soft tissue planes included osseous structures are nonsuspicious, large body habitus. IMPRESSION: No acute cardiopulmonary process for this low inspiratory portable examination. Normal bowel gas pattern. Electronically Signed   By: Elon Alas M.D.   On: 04/27/2016 06:11    Micro Results     No results found for this or any previous visit (from the past 240 hour(s)).     Today   Subjective    Keanna Tugwell today has no nausea or vomitting, no f/c.         Patient has been seen and examined prior to discharge   Objective   Blood pressure 134/83, pulse 77, temperature 98.4 F (36.9 C), temperature source Oral, resp. rate 20, height 5\' 1"  (1.549 m), weight 97.5 kg (215 lb), SpO2 97 %.   Intake/Output Summary (Last 24  hours) at 05/02/16 1125 Last data filed at 05/02/16 0092  Gross per 24 hour  Intake          1712.83 ml  Output                0 ml  Net          1712.83 ml    General exam: Pleasant, obese female in no distress, ambulating. Respiratory system: Clear to auscultation. Respiratory effort normal. Cardiovascular system: RRR, no murmur. No JVD or LE edema.   Gastrointestinal system: Abdomen is nondistended/obese, soft. minimal LLQ tenderness without rebound or guarding. Normoactive BS.  Central nervous system: Alert and oriented. No focal neurological deficits. Extremities: No deformities. RUE w/PICC in place without erythema or discharge.  Skin: No rashes, lesions or ulcers Psychiatry: Judgement and insight appear normal. Mood & affect appropriate.    Data Review   CBC w Diff: Lab Results  Component Value Date   WBC 5.2 05/02/2016   HGB 11.2 (L) 05/02/2016   HCT 34.6 (L) 05/02/2016   PLT 246 05/02/2016   LYMPHOPCT 21 04/27/2016   MONOPCT 5 04/27/2016   EOSPCT 2 04/27/2016   BASOPCT 0 04/27/2016    CMP: Lab Results  Component Value Date   NA 139 05/02/2016   K 3.3 (L) 05/02/2016   CL 104 05/02/2016   CO2 28 05/02/2016   BUN 6 05/02/2016   CREATININE 0.76 05/02/2016   PROT 7.0 04/27/2016   ALBUMIN 3.6 04/27/2016   BILITOT 0.4 04/27/2016   ALKPHOS 85 04/27/2016   AST 19 04/27/2016   ALT 15 04/27/2016  .   Total Discharge time is about 33 minutes  OSEI-BONSU,Eastin Swing M.D on 05/02/2016 at 11:25 AM  [facility]   Office  [phone]  Dragon dictation system was used to create this note, attempts have been made to correct errors, however presence of uncorrected errors is not a reflection quality of care provided

## 2016-05-02 NOTE — Progress Notes (Signed)
  Subjective: Pt doing well and ready to go home. Tol PO well, no abd pain  Objective: Vital signs in last 24 hours: Temp:  [97.8 F (36.6 C)-98.4 F (36.9 C)] 98.4 F (36.9 C) (04/07 0506) Pulse Rate:  [77-90] 77 (04/07 0506) Resp:  [18-20] 20 (04/07 0506) BP: (134-159)/(83-99) 134/83 (04/07 0506) SpO2:  [97 %-100 %] 97 % (04/07 0506) Last BM Date: 05/01/16  Intake/Output from previous day: 04/06 0701 - 04/07 0700 In: 1952.8 [P.O.:702; I.V.:1200.8; IV Piggyback:50] Out: -  Intake/Output this shift: No intake/output data recorded.  General appearance: alert and cooperative Cardio: regular rate and rhythm, S1, S2 normal, no murmur, click, rub or gallop GI: soft, non-tender; bowel sounds normal; no masses,  no organomegaly and incision c/d/i  Lab Results:   Recent Labs  05/01/16 0605 05/02/16 0609  WBC 5.3 5.2  HGB 11.7* 11.2*  HCT 35.6* 34.6*  PLT 267 246   BMET  Recent Labs  05/01/16 0605 05/02/16 0609  NA 139 139  K 3.3* 3.3*  CL 105 104  CO2 27 28  GLUCOSE 103* 105*  BUN 5* 6  CREATININE 0.90 0.76  CALCIUM 8.8* 8.6*   PT/INR No results for input(s): LABPROT, INR in the last 72 hours. ABG No results for input(s): PHART, HCO3 in the last 72 hours.  Invalid input(s): PCO2, PO2  Studies/Results: No results found.  Anti-infectives: Anti-infectives    Start     Dose/Rate Route Frequency Ordered Stop   05/01/16 1000  amoxicillin-clavulanate (AUGMENTIN) 875-125 MG per tablet 1 tablet     1 tablet Oral Every 12 hours 05/01/16 0833     04/29/16 1000  piperacillin-tazobactam (ZOSYN) IVPB 3.375 g  Status:  Discontinued     3.375 g 12.5 mL/hr over 240 Minutes Intravenous Every 8 hours 04/29/16 0838 05/01/16 0833   04/27/16 1400  metroNIDAZOLE (FLAGYL) IVPB 500 mg  Status:  Discontinued     500 mg 100 mL/hr over 60 Minutes Intravenous Every 8 hours 04/27/16 1110 04/29/16 0838   04/27/16 1200  ciprofloxacin (CIPRO) IVPB 400 mg  Status:  Discontinued     400 mg 200 mL/hr over 60 Minutes Intravenous Every 12 hours 04/27/16 1110 04/29/16 0838   04/27/16 0515  cefTRIAXone (ROCEPHIN) 2 g in dextrose 5 % 50 mL IVPB     2 g 100 mL/hr over 30 Minutes Intravenous  Once 04/27/16 0514 04/27/16 0605   04/27/16 0515  metroNIDAZOLE (FLAGYL) IVPB 500 mg     500 mg 100 mL/hr over 60 Minutes Intravenous  Once 04/27/16 0514 04/27/16 0650      Assessment/Plan: Acute sigmoid diverticulitis, recurrent, with contained perforation  HTN   Anemia GERD Asthma IBS   Plan:  OK for DC from surgery standpoint DC with PO abx x2 weeks total f/u Dr. Hassell Done in 2weeks Pt will need outpt Colonoscopy  In 6 weeks to eval diverticulosis  LOS: 5 days    Rosario Jacks., [facility] 05/02/2016

## 2016-05-26 ENCOUNTER — Encounter (HOSPITAL_COMMUNITY): Payer: Self-pay | Admitting: Emergency Medicine

## 2016-05-26 ENCOUNTER — Emergency Department (HOSPITAL_COMMUNITY)
Admission: EM | Admit: 2016-05-26 | Discharge: 2016-05-26 | Disposition: A | Discharge status: Home / Self Care | Payer: Medicaid Other | Attending: Physician Assistant | Admitting: Physician Assistant

## 2016-05-26 DIAGNOSIS — Z79899 Other long term (current) drug therapy: Secondary | ICD-10-CM | POA: Diagnosis not present

## 2016-05-26 DIAGNOSIS — Y939 Activity, unspecified: Secondary | ICD-10-CM | POA: Insufficient documentation

## 2016-05-26 DIAGNOSIS — S3992XA Unspecified injury of lower back, initial encounter: Secondary | ICD-10-CM | POA: Diagnosis not present

## 2016-05-26 DIAGNOSIS — Y999 Unspecified external cause status: Secondary | ICD-10-CM | POA: Diagnosis not present

## 2016-05-26 DIAGNOSIS — Y92410 Unspecified street and highway as the place of occurrence of the external cause: Secondary | ICD-10-CM | POA: Diagnosis not present

## 2016-05-26 DIAGNOSIS — J45909 Unspecified asthma, uncomplicated: Secondary | ICD-10-CM | POA: Insufficient documentation

## 2016-05-26 DIAGNOSIS — Z8673 Personal history of transient ischemic attack (TIA), and cerebral infarction without residual deficits: Secondary | ICD-10-CM | POA: Diagnosis not present

## 2016-05-26 DIAGNOSIS — I10 Essential (primary) hypertension: Secondary | ICD-10-CM | POA: Insufficient documentation

## 2016-05-26 DIAGNOSIS — M542 Cervicalgia: Secondary | ICD-10-CM | POA: Insufficient documentation

## 2016-05-26 MED ORDER — CYCLOBENZAPRINE HCL 10 MG PO TABS: 10.0000 mg | ORAL_TABLET | Freq: Two times a day (BID) | ORAL | 0 refills | Status: DC | PRN

## 2016-05-26 MED ORDER — IBUPROFEN 800 MG PO TABS: 800.0000 mg | ORAL_TABLET | Freq: Three times a day (TID) | ORAL | 0 refills | Status: DC

## 2016-05-26 NOTE — Discharge Instructions (Signed)
Please follow up with her primary care provider as needed. Please return as needed.
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
## 2016-05-31 ENCOUNTER — Encounter (HOSPITAL_COMMUNITY): Payer: Self-pay

## 2016-05-31 ENCOUNTER — Emergency Department (HOSPITAL_COMMUNITY)
Admission: EM | Admit: 2016-05-31 | Discharge: 2016-05-31 | Disposition: A | Discharge status: Home / Self Care | Payer: Medicaid Other | Attending: Emergency Medicine | Admitting: Emergency Medicine

## 2016-05-31 DIAGNOSIS — R1032 Left lower quadrant pain: Secondary | ICD-10-CM | POA: Diagnosis present

## 2016-05-31 DIAGNOSIS — K5792 Diverticulitis of intestine, part unspecified, without perforation or abscess without bleeding: Secondary | ICD-10-CM | POA: Diagnosis not present

## 2016-05-31 DIAGNOSIS — Z79899 Other long term (current) drug therapy: Secondary | ICD-10-CM | POA: Diagnosis not present

## 2016-05-31 DIAGNOSIS — J45909 Unspecified asthma, uncomplicated: Secondary | ICD-10-CM | POA: Insufficient documentation

## 2016-05-31 DIAGNOSIS — Z8673 Personal history of transient ischemic attack (TIA), and cerebral infarction without residual deficits: Secondary | ICD-10-CM | POA: Diagnosis not present

## 2016-05-31 DIAGNOSIS — I10 Essential (primary) hypertension: Secondary | ICD-10-CM | POA: Insufficient documentation

## 2016-05-31 LAB — COMPREHENSIVE METABOLIC PANEL
ALT: 13 U/L — ABNORMAL LOW (ref 14–54)
AST: 17 U/L (ref 15–41)
Albumin: 4.1 g/dL (ref 3.5–5.0)
Alkaline Phosphatase: 99 U/L (ref 38–126)
Anion gap: 8 (ref 5–15)
BUN: 9 mg/dL (ref 6–20)
CO2: 26 mmol/L (ref 22–32)
Calcium: 9.4 mg/dL (ref 8.9–10.3)
Chloride: 103 mmol/L (ref 101–111)
Creatinine, Ser: 0.77 mg/dL (ref 0.44–1.00)
GFR calc Af Amer: >60 mL/min (ref >60)
GFR calc non Af Amer: >60 mL/min (ref >60)
Glucose, Bld: 102 mg/dL — ABNORMAL HIGH (ref 65–99)
Potassium: 3.6 mmol/L (ref 3.5–5.1)
Sodium: 137 mmol/L (ref 135–145)
Total Bilirubin: 0.5 mg/dL (ref 0.3–1.2)
Total Protein: 8.2 g/dL — ABNORMAL HIGH (ref 6.5–8.1)

## 2016-05-31 LAB — CBC
HCT: 39 % (ref 36.0–46.0)
Hemoglobin: 13.1 g/dL (ref 12.0–15.0)
MCH: 28.1 pg (ref 26.0–34.0)
MCHC: 33.6 g/dL (ref 30.0–36.0)
MCV: 83.5 fL (ref 78.0–100.0)
Platelets: 307 10*3/uL (ref 150–400)
RBC: 4.67 MIL/uL (ref 3.87–5.11)
RDW: 13.6 % (ref 11.5–15.5)
WBC: 9.8 10*3/uL (ref 4.0–10.5)

## 2016-05-31 LAB — I-STAT BETA HCG BLOOD, ED (MC, WL, AP ONLY): I-stat hCG, quantitative: <5 m[IU]/mL (ref <5)

## 2016-05-31 LAB — LIPASE, BLOOD: Lipase: 23 U/L (ref 11–51)

## 2016-05-31 MED ORDER — ONDANSETRON HCL 4 MG PO TABS: 4.0000 mg | ORAL_TABLET | Freq: Four times a day (QID) | ORAL | 0 refills | Status: DC

## 2016-05-31 MED ORDER — SODIUM CHLORIDE 0.9 % IV BOLUS (SEPSIS)
1000.0000 mL | Freq: Once | INTRAVENOUS | Status: AC
  Administered 2016-05-31: 1000 mL via INTRAVENOUS

## 2016-05-31 MED ORDER — KETOROLAC TROMETHAMINE 15 MG/ML IJ SOLN
15.0000 mg | Freq: Once | INTRAMUSCULAR | Status: AC
  Administered 2016-05-31: 15 mg via INTRAVENOUS
  Filled 2016-05-31: qty 1

## 2016-05-31 MED ORDER — HYDROMORPHONE HCL 1 MG/ML IJ SOLN
1.0000 mg | Freq: Once | INTRAMUSCULAR | Status: AC
  Administered 2016-05-31: 1 mg via INTRAVENOUS
  Filled 2016-05-31: qty 1

## 2016-05-31 MED ORDER — CIPROFLOXACIN IN D5W 400 MG/200ML IV SOLN
400.0000 mg | Freq: Once | INTRAVENOUS | Status: AC
  Administered 2016-05-31: 400 mg via INTRAVENOUS
  Filled 2016-05-31: qty 200

## 2016-05-31 MED ORDER — CIPROFLOXACIN HCL 500 MG PO TABS: 500.0000 mg | ORAL_TABLET | Freq: Two times a day (BID) | ORAL | 0 refills | Status: DC

## 2016-05-31 MED ORDER — LORAZEPAM 2 MG/ML IJ SOLN
0.5000 mg | Freq: Once | INTRAMUSCULAR | Status: AC
  Administered 2016-05-31: 0.5 mg via INTRAVENOUS
  Filled 2016-05-31: qty 1

## 2016-05-31 MED ORDER — ONDANSETRON HCL 4 MG/2ML IJ SOLN
4.0000 mg | Freq: Once | INTRAMUSCULAR | Status: AC
  Administered 2016-05-31: 4 mg via INTRAVENOUS

## 2016-05-31 MED ORDER — METRONIDAZOLE IN NACL 5-0.79 MG/ML-% IV SOLN
500.0000 mg | Freq: Once | INTRAVENOUS | Status: AC
  Administered 2016-05-31: 500 mg via INTRAVENOUS
  Filled 2016-05-31: qty 100

## 2016-05-31 MED ORDER — METRONIDAZOLE 500 MG PO TABS: 500.0000 mg | ORAL_TABLET | Freq: Three times a day (TID) | ORAL | 0 refills | Status: DC

## 2016-05-31 MED ORDER — HYDROMORPHONE HCL 1 MG/ML IJ SOLN
0.5000 mg | Freq: Once | INTRAMUSCULAR | Status: AC
  Administered 2016-05-31: 0.5 mg via INTRAVENOUS
  Filled 2016-05-31: qty 0.5

## 2016-05-31 MED ORDER — OXYCODONE-ACETAMINOPHEN 5-325 MG PO TABS: 1.0000 | ORAL_TABLET | ORAL | 0 refills | Status: DC | PRN

## 2016-05-31 NOTE — ED Notes (Signed)
Patient was made aware of pending urine sample. Patient stated she will call out when ready.

## 2016-05-31 NOTE — ED Notes (Signed)
She feels better; and has felt so for a while now. She is drowsy, and forgets to keep her arm straight, so that her Cipro has not completely infused. Will d/c when Cipro finished.

## 2016-05-31 NOTE — ED Triage Notes (Signed)
She c/o left-sided abd. Pain since yesterday. She states she has hx of diverticulitis; and after an episode of this ~1 month ago she was referred to a surgeon "to have an operation because I've had so much trouble with this--I've had it so many times". She states she has yet to follow up with surgery. She is tearful upon arrival.

## 2016-05-31 NOTE — ED Notes (Signed)
Bed: WA15 Expected date:  Expected time:  Means of arrival:  Comments: EMS/abd. pain 

## 2016-05-31 NOTE — ED Provider Notes (Signed)
[place] DEPT Provider Note   CSN: [id number] Arrival date & time: 05/31/16  1041  By signing my name below, I, Hansel Feinstein, attest that this documentation has been prepared under the direction and in the presence of Virgel Manifold, MD. Electronically Signed: Hansel Feinstein, ED Scribe. 05/31/16. 10:59 AM.     History   Chief Complaint Chief Complaint  Patient presents with  . Abdominal Pain    HPI Briana Wade is a 46 y.o. female with h/o diverticulitis, IBS who presents to the Emergency Department complaining of severe, constant LLQ pain that began yesterday. Pt states her current pain is similar to diverticulitis flares in the past. No fever or chills. No vomiting. Has eben advised to see a surgeon previously but has yet to.     The history is provided by the patient. No language interpreter was used.    Past Medical History:  Diagnosis Date  . Abdominal pain, unspecified site   . Allergy   . Anemia   . Asthma   . Chlamydia   . Diverticulitis   . Esophageal reflux   . Hiatal hernia   . Hypertension   . IBS (irritable bowel syndrome)   . Insomnia   . Lactose intolerance   . Migraine   . Other constipation   . Personal history of colonic polyps 09/12/2010   tubular adenoma  . Stroke [facility]) 2014   minor right srm weakness admitted to [facility]  . Trichimoniasis   . Tubular adenoma of colon   . Umbilical hernia   . Uterine fibroid     Patient Active Problem List   Diagnosis Date Noted  . Diverticulosis of sigmoid colon 04/27/2016  . Acute diverticulitis of intestine 04/27/2016  . Diverticula of colon 05/21/2015  . Asthma 05/21/2015  . Acute diverticulitis 05/21/2015  . Diverticulitis of sigmoid colon 06/02/2011  . Nausea and vomiting 06/02/2011  . Otitis 06/02/2011  . HTN (hypertension) 06/02/2011  . GERD (gastroesophageal reflux disease) 10/21/2010  . Gluten intolerance 10/21/2010  . Personal history of colonic polyps 10/21/2010  . Flatulence,  eructation, and gas pain 09/12/2010  . 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 Irritable bowel syndrome 09/12/2010  . Abdominal pain 08/26/2010  . Bloating 08/26/2010  . 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 IBS (irritable bowel syndrome) 08/26/2010  . CONSTIPATION, CHRONIC 05/11/2007  . ABDOMINAL PAIN, CHRONIC 05/11/2007    Past Surgical History:  Procedure Laterality Date  . APPENDECTOMY    . APPENDECTOMY    . COLONOSCOPY    . DILATION AND CURETTAGE OF UTERUS     x2 for EAB  . HYSTEROSCOPY WITH NOVASURE N/A 12/17/2015   Procedure: HYSTEROSCOPY WITH NOVASURE;  Surgeon: Aletha Halim, MD;  Location: [facility];  Service: Gynecology;  Laterality: N/A;  . TUBAL LIGATION      OB History    Gravida Para Term Preterm AB Living   7 3 3   4 3    SAB TAB Ectopic Multiple Live Births   1 3            Obstetric Comments   svd x 3       Home Medications    Prior to Admission medications   Medication Sig Start Date End Date Taking? Authorizing Provider  amoxicillin-clavulanate (AUGMENTIN) 875-125 MG tablet Take 1 tablet by mouth every 12 (twelve) hours. 05/02/16   Benito Mccreedy, MD  cyclobenzaprine (FLEXERIL) 10 MG tablet Take 1 tablet (10 mg total) by mouth 2 (two)  times daily as needed for muscle spasms. 05/26/16   Mackuen, Courteney Lyn, MD  ibuprofen (ADVIL,MOTRIN) 800 MG tablet Take 1 tablet (800 mg total) by mouth 3 (three) times daily. 05/26/16   Mackuen, Courteney Lyn, MD  losartan (COZAAR) 50 MG tablet Take 50 mg by mouth daily.    [provider]  metroNIDAZOLE (FLAGYL) 500 MG tablet Take 1 tablet (500 mg total) by mouth every 8 (eight) hours. Patient not taking: Reported on 01/22/2016 01/14/16   Seabron Spates, CNM  potassium chloride (K-DUR) 10 MEQ tablet Take 1 tablet (10 mEq total) by mouth daily. 05/02/16   Benito Mccreedy, MD    Family History Family History  Problem Relation Age of Onset  .  Hypertension Mother   . Cancer Mother     not sure of type  . Colon cancer Neg Hx   . Esophageal cancer Neg Hx   . Rectal cancer Neg Hx   . Stomach cancer Neg Hx     Social History Social History  Substance Use Topics  . Smoking status: Never Smoker  . Smokeless tobacco: Never Used  . Alcohol use Yes     Comment: occasionally     Allergies   Patient has no known allergies.   Review of Systems Review of Systems  Gastrointestinal: Positive for abdominal pain.  All other systems reviewed and are negative.   Physical Exam Updated Vital Signs BP (!) 147/98 (BP Location: Left Arm)   Pulse 88   Temp 97.5 F (36.4 C) (Oral)   Resp (!) 22   LMP 05/12/2016 (Approximate)   SpO2 98%   Physical Exam  Constitutional: She appears well-developed and well-nourished.  Uncomfortable, tearful  HENT:  Head: Normocephalic and atraumatic.  Mouth/Throat: Oropharynx is clear and moist. No oropharyngeal exudate.  Eyes: Conjunctivae and EOM are normal. Pupils are equal, round, and reactive to light. Right eye exhibits no discharge. Left eye exhibits no discharge. No scleral icterus.  Neck: Normal range of motion. Neck supple. No JVD present. No thyromegaly present.  Cardiovascular: Normal rate, regular rhythm, normal heart sounds and intact distal pulses.  Exam reveals no gallop and no friction rub.   No murmur heard. Pulmonary/Chest: Effort normal and breath sounds normal. No respiratory distress. She has no wheezes. She has no rales.  Abdominal: Soft. Bowel sounds are normal. She exhibits no distension and no mass. There is tenderness.  Left-sided abdominal tenderness.   Musculoskeletal: Normal range of motion. She exhibits no edema or tenderness.  Lymphadenopathy:    She has no cervical adenopathy.  Neurological: She is alert. Coordination normal.  Skin: Skin is warm and dry. No rash noted. No erythema.  Psychiatric: She has a normal mood and affect. Her behavior is normal.  Nursing  note and vitals reviewed.    ED Treatments / Results   DIAGNOSTIC STUDIES: Oxygen Saturation is 98% on RA, normal by my interpretation.    COORDINATION OF CARE: 10:53 AM Discussed treatment plan with pt at bedside which includes pain control and pt agreed to plan.    Labs (all labs ordered are listed, but only abnormal results are displayed) Labs Reviewed  COMPREHENSIVE METABOLIC PANEL - Abnormal; Notable for the following:       Result Value   Glucose, Bld 102 (*)    Total Protein 8.2 (*)    ALT 13 (*)    All other components within normal limits  LIPASE, BLOOD  CBC  I-STAT BETA HCG BLOOD, ED ([facility], [facility], [facility]  ONLY)    EKG  EKG Interpretation None       Radiology No results found.  Procedures Procedures (including critical care time)  Medications Ordered in ED Medications - No data to display   Initial Impression / Assessment and Plan / ED Course  I have reviewed the triage vital signs and the nursing notes.  Pertinent labs & imaging results that were available during my care of the patient were reviewed by me and considered in my medical decision making (see chart for details).      Final Clinical Impressions(s) / ED Diagnoses   Final diagnoses:  Diverticulitis    L sided abdominal pain. Hx of recurrent diverticulitis. No peritonitis. I doubt emergent complication or other serious medical condition. Needs to FU with surgery. Return precautions discussed.   New Prescriptions New Prescriptions   No medications on file    I personally preformed the services scribed in my presence. The recorded information has been reviewed is accurate. Virgel Manifold, MD.    Virgel Manifold, MD 06/07/16 [phone]

## 2016-06-07 MED ORDER — IBUPROFEN 600 MG PO TABS: 600.0000 mg | ORAL_TABLET | Freq: Four times a day (QID) | ORAL | 0 refills | Status: DC | PRN

## 2016-10-02 ENCOUNTER — Emergency Department (HOSPITAL_COMMUNITY)
Admission: EM | Admit: 2016-10-02 | Discharge: 2016-10-02 | Disposition: A | Discharge status: Home / Self Care | Payer: Medicaid Other | Attending: Emergency Medicine | Admitting: Emergency Medicine

## 2016-10-02 ENCOUNTER — Emergency Department (HOSPITAL_COMMUNITY): Payer: Medicaid Other

## 2016-10-02 ENCOUNTER — Encounter (HOSPITAL_COMMUNITY): Payer: Self-pay | Admitting: Emergency Medicine

## 2016-10-02 DIAGNOSIS — Z79899 Other long term (current) drug therapy: Secondary | ICD-10-CM | POA: Insufficient documentation

## 2016-10-02 DIAGNOSIS — B9789 Other viral agents as the cause of diseases classified elsewhere: Secondary | ICD-10-CM | POA: Insufficient documentation

## 2016-10-02 DIAGNOSIS — J45909 Unspecified asthma, uncomplicated: Secondary | ICD-10-CM | POA: Insufficient documentation

## 2016-10-02 DIAGNOSIS — R05 Cough: Secondary | ICD-10-CM | POA: Insufficient documentation

## 2016-10-02 DIAGNOSIS — J069 Acute upper respiratory infection, unspecified: Secondary | ICD-10-CM | POA: Diagnosis not present

## 2016-10-02 DIAGNOSIS — I10 Essential (primary) hypertension: Secondary | ICD-10-CM | POA: Diagnosis not present

## 2016-10-02 MED ORDER — BENZONATATE 100 MG PO CAPS
200.0000 mg | ORAL_CAPSULE | Freq: Once | ORAL | Status: AC
  Administered 2016-10-02: 200 mg via ORAL
  Filled 2016-10-02: qty 2

## 2016-10-02 MED ORDER — BENZONATATE 100 MG PO CAPS: 100.0000 mg | ORAL_CAPSULE | Freq: Three times a day (TID) | ORAL | 0 refills | Status: DC

## 2016-10-02 MED ORDER — ALBUTEROL SULFATE HFA 108 (90 BASE) MCG/ACT IN AERS: 1.0000 | INHALATION_SPRAY | Freq: Four times a day (QID) | RESPIRATORY_TRACT | 0 refills | Status: DC | PRN

## 2016-10-02 NOTE — ED Provider Notes (Signed)
[place] DEPT Provider Note   CSN: [id number] Arrival date & time: 10/02/16  0138     History   Chief Complaint Chief Complaint  Patient presents with  . Cough  . Generalized Body Aches    HPI Briana Wade is a 46 y.o. female.  HPI  SUBJECTIVE:  Briana Wade is a 46 y.o. female who complains of congestion, productive cough, cough described as productive, myalgias, wheezing and headache for 3 days. Pt has mild chest discomfort when she coughs, and there is no shortness of breath.  She denies a history of fevers and sweats and denies a history of asthma. Patient denies smoking cigarettes or drug use. She stated that her son had similar symptoms last week. Sputum is [place], thick. Pt has no hx of PE, DVT and denies any exogenous hormone (testosterone / estrogen) use, long distance travels or surgery in the past 6 weeks, active cancer, recent immobilization.     Past Medical History:  Diagnosis Date  . Abdominal pain, unspecified site   . Allergy   . Anemia   . Asthma   . Chlamydia   . Diverticulitis   . Esophageal reflux   . Hiatal hernia   . Hypertension   . IBS (irritable bowel syndrome)   . Insomnia   . Lactose intolerance   . Migraine   . Other constipation   . Personal history of colonic polyps 09/12/2010   tubular adenoma  . 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 Trichimoniasis   . Tubular adenoma of colon   . Umbilical hernia   . Uterine fibroid     Patient Active Problem List   Diagnosis Date Noted  . Diverticulosis of sigmoid colon 04/27/2016  . Acute diverticulitis of intestine 04/27/2016  . Diverticula of colon 05/21/2015  . Asthma 05/21/2015  . Acute diverticulitis 05/21/2015  . Diverticulitis of sigmoid colon 06/02/2011  . Nausea and vomiting 06/02/2011  . Otitis 06/02/2011  . HTN (hypertension) 06/02/2011  . GERD (gastroesophageal reflux disease) 10/21/2010  . Gluten intolerance 10/21/2010  .  Personal history of colonic polyps 10/21/2010  . Flatulence, eructation, and gas pain 09/12/2010  . 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 Irritable bowel syndrome 09/12/2010  . Abdominal pain 08/26/2010  . Bloating 08/26/2010  . 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 IBS (irritable bowel syndrome) 08/26/2010  . CONSTIPATION, CHRONIC 05/11/2007  . ABDOMINAL PAIN, CHRONIC 05/11/2007    Past Surgical History:  Procedure Laterality Date  . APPENDECTOMY    . APPENDECTOMY    . COLONOSCOPY    . DILATION AND CURETTAGE OF UTERUS     x2 for EAB  . HYSTEROSCOPY WITH NOVASURE N/A 12/17/2015   Procedure: HYSTEROSCOPY WITH NOVASURE;  Surgeon: Aletha Halim, MD;  Location: [facility];  Service: Gynecology;  Laterality: N/A;  . TUBAL LIGATION      OB History    Gravida Para Term Preterm AB Living   7 3 3   4 3    SAB TAB Ectopic Multiple Live Births   1 3            Obstetric Comments   svd x 3       Home Medications    Prior to Admission medications   Medication Sig Start Date End Date Taking? Authorizing Provider  losartan (COZAAR) 50 MG tablet Take 50 mg by mouth daily.   Yes [provider]  mometasone (ASMANEX) 220 MCG/INH  inhaler Inhale 2 puffs into the lungs daily.   Yes [provider]  albuterol (PROVENTIL HFA;VENTOLIN HFA) 108 (90 Base) MCG/ACT inhaler Inhale 1-2 puffs into the lungs every 6 (six) hours as needed for wheezing or shortness of breath. 10/02/16   Varney Biles, MD  benzonatate (TESSALON) 100 MG capsule Take 1 capsule (100 mg total) by mouth every 8 (eight) hours. 10/02/16   Varney Biles, MD  ciprofloxacin (CIPRO) 500 MG tablet Take 1 tablet (500 mg total) by mouth 2 (two) times daily. Patient not taking: Reported on 10/02/2016 05/31/16   Virgel Manifold, MD  cyclobenzaprine (FLEXERIL) 10 MG tablet Take 1 tablet (10 mg total) by mouth 2 (two) times daily as needed for muscle spasms. Patient not  taking: Reported on 10/02/2016 05/26/16   Mackuen, Courteney Lyn, MD  ibuprofen (ADVIL,MOTRIN) 600 MG tablet Take 1 tablet (600 mg total) by mouth every 6 (six) hours as needed. Patient not taking: Reported on 10/02/2016 06/07/16   Virgel Manifold, MD  ibuprofen (ADVIL,MOTRIN) 800 MG tablet Take 1 tablet (800 mg total) by mouth 3 (three) times daily. Patient not taking: Reported on 10/02/2016 05/26/16   Mackuen, Courteney Lyn, MD  metroNIDAZOLE (FLAGYL) 500 MG tablet Take 1 tablet (500 mg total) by mouth 3 (three) times daily. Patient not taking: Reported on 10/02/2016 05/31/16   Virgel Manifold, MD  ondansetron (ZOFRAN) 4 MG tablet Take 1 tablet (4 mg total) by mouth every 6 (six) hours. Patient not taking: Reported on 10/02/2016 05/31/16   Virgel Manifold, MD  oxyCODONE-acetaminophen (PERCOCET/ROXICET) 5-325 MG tablet Take 1-2 tablets by mouth every 4 (four) hours as needed for severe pain. Patient not taking: Reported on 10/02/2016 05/31/16   Virgel Manifold, MD    Family History Family History  Problem Relation Age of Onset  . Hypertension Mother   . Cancer Mother        not sure of type  . Colon cancer Neg Hx   . Esophageal cancer Neg Hx   . Rectal cancer Neg Hx   . Stomach cancer Neg Hx     Social History Social History  Substance Use Topics  . Smoking status: Never Smoker  . Smokeless tobacco: Never Used  . Alcohol use No     Allergies   Patient has no known allergies.   Review of Systems Review of Systems  Constitutional: Positive for activity change.  HENT: Positive for congestion.   Eyes: Negative for visual disturbance.  Respiratory: Positive for cough and wheezing.   Cardiovascular: Positive for chest pain.  Skin: Negative for rash.  Allergic/Immunologic: Negative for immunocompromised state.  Neurological: Negative for dizziness and syncope.     Physical Exam Updated Vital Signs BP (!) 131/106 (BP Location: Left Arm)   Pulse 88   Temp 98.8 F (37.1 C) (Oral)   Resp 20    SpO2 98%   Physical Exam  Constitutional: She is oriented to person, place, and time. She appears well-developed.  HENT:  Head: Normocephalic and atraumatic.  Eyes: EOM are normal.  Neck: Normal range of motion. Neck supple.  Cardiovascular: Normal rate, regular rhythm and intact distal pulses.   No murmur heard. Pulmonary/Chest: Effort normal. No respiratory distress. She has no wheezes.  Abdominal: Bowel sounds are normal.  Neurological: She is alert and oriented to person, place, and time.  Skin: Skin is warm and dry.  Nursing note and vitals reviewed.    ED Treatments / Results  Labs (all labs ordered are listed, but only abnormal  results are displayed) Labs Reviewed - No data to display  EKG  EKG Interpretation None       Radiology Dg Chest 2 View  Result Date: 10/02/2016 CLINICAL DATA:  Cough,congestion,wheezing for 4 days, patient states she has asthma EXAM: CHEST  2 VIEW COMPARISON:  04/27/2016 FINDINGS: Normal heart size and pulmonary vascularity. No focal airspace disease or consolidation in the lungs. No blunting of costophrenic angles. No pneumothorax. Mediastinal contours appear intact. Old anterior compression of L1 vertebra. IMPRESSION: No active cardiopulmonary disease. Electronically Signed   By: Lucienne Capers M.D.   On: 10/02/2016 02:34    Procedures Procedures (including critical care time)  Medications Ordered in ED Medications  benzonatate (TESSALON) capsule 200 mg (not administered)     Initial Impression / Assessment and Plan / ED Course  I have reviewed the triage vital signs and the nursing notes.  Pertinent labs & imaging results that were available during my care of the patient were reviewed by me and considered in my medical decision making (see chart for details).     Pt comes in with cc of cough with associated chest pain (with cough) and mild DIB. Pt is not toxic appearing. Lungs are clear. Cardiac exam is benign. Pt is a healthy  woman. VSS and WNL. CXR is clear. Concerns for pneumonia, myocarditis, PE is low.  ASSESSMENT:  viral upper respiratory illness  PLAN: Symptomatic therapy suggested: push fluids, rest and return office visit prn if symptoms persist or worsen. Lack of antibiotic effectiveness discussed with her. Call or return to clinic prn if these symptoms worsen or fail to improve as anticipated.   Final Clinical Impressions(s) / ED Diagnoses   Final diagnoses:  Viral URI with cough    New Prescriptions New Prescriptions   ALBUTEROL (PROVENTIL HFA;VENTOLIN HFA) 108 (90 BASE) MCG/ACT INHALER    Inhale 1-2 puffs into the lungs every 6 (six) hours as needed for wheezing or shortness of breath.   BENZONATATE (TESSALON) 100 MG CAPSULE    Take 1 capsule (100 mg total) by mouth every 8 (eight) hours.     Varney Biles, MD 10/02/16 [phone]

## 2016-10-02 NOTE — Discharge Instructions (Signed)
°  We think what you have is a viral syndrome - the treatment for which is symptomatic relief only, and your body will fight the infection off in a few days. We are prescribing you some meds for your symptoms. See your primary care doctor in 1 week if the symptoms dont improve.

## 2016-10-02 NOTE — ED Triage Notes (Signed)
Pt states she has had a cough, body aches, headaches x 4 days

## 2016-10-08 ENCOUNTER — Emergency Department (HOSPITAL_COMMUNITY): Payer: Medicaid Other

## 2016-10-08 ENCOUNTER — Encounter (HOSPITAL_COMMUNITY): Payer: Self-pay | Admitting: Emergency Medicine

## 2016-10-08 ENCOUNTER — Emergency Department (HOSPITAL_COMMUNITY)
Admission: EM | Admit: 2016-10-08 | Discharge: 2016-10-08 | Disposition: A | Discharge status: Home / Self Care | Payer: Medicaid Other | Attending: Emergency Medicine | Admitting: Emergency Medicine

## 2016-10-08 DIAGNOSIS — J189 Pneumonia, unspecified organism: Secondary | ICD-10-CM | POA: Diagnosis not present

## 2016-10-08 DIAGNOSIS — I10 Essential (primary) hypertension: Secondary | ICD-10-CM | POA: Insufficient documentation

## 2016-10-08 DIAGNOSIS — J4541 Moderate persistent asthma with (acute) exacerbation: Secondary | ICD-10-CM | POA: Insufficient documentation

## 2016-10-08 DIAGNOSIS — R05 Cough: Secondary | ICD-10-CM | POA: Diagnosis present

## 2016-10-08 MED ORDER — PREDNISONE 10 MG PO TABS: 40.0000 mg | ORAL_TABLET | Freq: Every day | ORAL | 0 refills | Status: AC

## 2016-10-08 MED ORDER — HYDROCODONE-HOMATROPINE 5-1.5 MG/5ML PO SYRP
5.0000 mL | ORAL_SOLUTION | Freq: Once | ORAL | Status: AC
  Administered 2016-10-08: 5 mL via ORAL
  Filled 2016-10-08: qty 5

## 2016-10-08 MED ORDER — DOXYCYCLINE HYCLATE 100 MG PO CAPS: 100.0000 mg | ORAL_CAPSULE | Freq: Two times a day (BID) | ORAL | 0 refills | Status: AC

## 2016-10-08 MED ORDER — IPRATROPIUM-ALBUTEROL 0.5-2.5 (3) MG/3ML IN SOLN
3.0000 mL | Freq: Once | RESPIRATORY_TRACT | Status: AC
  Administered 2016-10-08: 3 mL via RESPIRATORY_TRACT
  Filled 2016-10-08: qty 3

## 2016-10-08 MED ORDER — PREDNISONE 20 MG PO TABS
60.0000 mg | ORAL_TABLET | Freq: Once | ORAL | Status: AC
  Administered 2016-10-08: 60 mg via ORAL
  Filled 2016-10-08: qty 3

## 2016-10-08 MED ORDER — DOXYCYCLINE HYCLATE 100 MG PO TABS
100.0000 mg | ORAL_TABLET | Freq: Once | ORAL | Status: AC
  Administered 2016-10-08: 100 mg via ORAL
  Filled 2016-10-08: qty 1

## 2016-10-08 MED ORDER — HYDROCODONE-HOMATROPINE 5-1.5 MG/5ML PO SYRP: 5.0000 mL | ORAL_SOLUTION | Freq: Four times a day (QID) | ORAL | 0 refills | Status: DC | PRN

## 2016-10-08 NOTE — ED Notes (Signed)
ED Provider at bedside. 

## 2016-10-08 NOTE — Discharge Instructions (Signed)
Please take all of your antibiotics until finished!   You may develop abdominal discomfort or diarrhea from the antibiotic.  You may help offset this with probiotics which you can buy or get in yogurt. Do not eat  or take the probiotics until 2 hours after your antibiotic.  Take the prednisone as prescribed starting tomorrow. You received the first dose of the antibiotic and prednisone here in the emergency department. You may take the cough syrup every 6 hours as needed for cough. Continue to take her asthma medications as needed. Follow up with a primary care physician for reevaluation of your symptoms. Return to the ED immediately if any concerning signs or symptoms develop such as worsening chest pain, persistent shortness of breath, or passing out.

## 2016-10-08 NOTE — ED Provider Notes (Signed)
[place] DEPT Provider Note   CSN: [id number] Arrival date & time: 10/08/16  0801     History   Chief Complaint Chief Complaint  Patient presents with  . Cough  . Facial Pain    HPI Briana Wade is a 46 y.o. female the history of asthma, GERD, HTN who presents today with chief complaint acute onset, persisting productive cough for 11 days. Patient was seen and evaluated for this here in the emergency department 6 days ago and was found to be safe for discharge home with Tessalon and albuterol inhaler. CXR negative at the time. Cough is intermittently productive of yellow sputum. Associated symptoms include wheezing, shortness of breath, myalgias, chest tightness, and chest pain with cough and deep inspiration. She has been using an albuterol inhaler with mild relief of her symptoms. She states Tessalon has not been helpful. NyQuil and other over-the-counter medications have not been helpful. Shortness of breath worsens when laying flat and improves leaning forward and sitting up. Chest pain does not radiate. Endorses a mild sore throat for 2 days. Denies fever, abdominal pain, nausea, vomiting, leg swelling, palpitations. Denies recent travel or surgeries, hemoptysis, prior history of DVT or PE, cancer, IVDU, or diabetes mellitus. The history is provided by the patient.    Past Medical History:  Diagnosis Date  . Abdominal pain, unspecified site   . Allergy   . Anemia   . Asthma   . Chlamydia   . Diverticulitis   . Esophageal reflux   . Hiatal hernia   . Hypertension   . IBS (irritable bowel syndrome)   . Insomnia   . Lactose intolerance   . Migraine   . Other constipation   . Personal history of colonic polyps 09/12/2010   tubular adenoma  . Stroke [facility]) 2014   minor right srm weakness admitted to [facility]  . Trichimoniasis   . Tubular adenoma of colon   . Umbilical hernia   . Uterine fibroid     Patient Active Problem List   Diagnosis Date Noted  .  Diverticulosis of sigmoid colon 04/27/2016  . Acute diverticulitis of intestine 04/27/2016  . Diverticula of colon 05/21/2015  . Asthma 05/21/2015  . Acute diverticulitis 05/21/2015  . Diverticulitis of sigmoid colon 06/02/2011  . Nausea and vomiting 06/02/2011  . Otitis 06/02/2011  . HTN (hypertension) 06/02/2011  . GERD (gastroesophageal reflux disease) 10/21/2010  . Gluten intolerance 10/21/2010  . Personal history of colonic polyps 10/21/2010  . Flatulence, eructation, and gas pain 09/12/2010  . 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 Irritable bowel syndrome 09/12/2010  . Abdominal pain 08/26/2010  . Bloating 08/26/2010  . 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 IBS (irritable bowel syndrome) 08/26/2010  . CONSTIPATION, CHRONIC 05/11/2007  . ABDOMINAL PAIN, CHRONIC 05/11/2007    Past Surgical History:  Procedure Laterality Date  . APPENDECTOMY    . APPENDECTOMY    . COLONOSCOPY    . DILATION AND CURETTAGE OF UTERUS     x2 for EAB  . HYSTEROSCOPY WITH NOVASURE N/A 12/17/2015   Procedure: HYSTEROSCOPY WITH NOVASURE;  Surgeon: Aletha Halim, MD;  Location: [facility];  Service: Gynecology;  Laterality: N/A;  . TUBAL LIGATION      OB History    Gravida Para Term Preterm AB Living   7 3 3   4 3    SAB TAB Ectopic Multiple Live Births   1 3            Obstetric  Comments   svd x 3       Home Medications    Prior to Admission medications   Medication Sig Start Date End Date Taking? Authorizing Provider  albuterol (PROVENTIL HFA;VENTOLIN HFA) 108 (90 Base) MCG/ACT inhaler Inhale 1-2 puffs into the lungs every 6 (six) hours as needed for wheezing or shortness of breath. 10/02/16   Varney Biles, MD  benzonatate (TESSALON) 100 MG capsule Take 1 capsule (100 mg total) by mouth every 8 (eight) hours. 10/02/16   Varney Biles, MD  ciprofloxacin (CIPRO) 500 MG tablet Take 1 tablet (500 mg total) by mouth 2 (two) times  daily. Patient not taking: Reported on 10/02/2016 05/31/16   Virgel Manifold, MD  cyclobenzaprine (FLEXERIL) 10 MG tablet Take 1 tablet (10 mg total) by mouth 2 (two) times daily as needed for muscle spasms. Patient not taking: Reported on 10/02/2016 05/26/16   Mackuen, Fredia Sorrow, MD  doxycycline (VIBRAMYCIN) 100 MG capsule Take 1 capsule (100 mg total) by mouth 2 (two) times daily. 10/08/16 10/18/16  Rodell Perna A, PA-C  HYDROcodone-homatropine (HYCODAN) 5-1.5 MG/5ML syrup Take 5 mLs by mouth every 6 (six) hours as needed for cough. 10/08/16   Tehilla Coffel A, PA-C  ibuprofen (ADVIL,MOTRIN) 600 MG tablet Take 1 tablet (600 mg total) by mouth every 6 (six) hours as needed. Patient not taking: Reported on 10/02/2016 06/07/16   Virgel Manifold, MD  ibuprofen (ADVIL,MOTRIN) 800 MG tablet Take 1 tablet (800 mg total) by mouth 3 (three) times daily. Patient not taking: Reported on 10/02/2016 05/26/16   Mackuen, Courteney Lyn, MD  losartan (COZAAR) 50 MG tablet Take 50 mg by mouth daily.    [provider]  metroNIDAZOLE (FLAGYL) 500 MG tablet Take 1 tablet (500 mg total) by mouth 3 (three) times daily. Patient not taking: Reported on 10/02/2016 05/31/16   Virgel Manifold, MD  mometasone [facility]) 220 MCG/INH inhaler Inhale 2 puffs into the lungs daily.    [provider]  ondansetron (ZOFRAN) 4 MG tablet Take 1 tablet (4 mg total) by mouth every 6 (six) hours. Patient not taking: Reported on 10/02/2016 05/31/16   Virgel Manifold, MD  oxyCODONE-acetaminophen (PERCOCET/ROXICET) 5-325 MG tablet Take 1-2 tablets by mouth every 4 (four) hours as needed for severe pain. Patient not taking: Reported on 10/02/2016 05/31/16   Virgel Manifold, MD  predniSONE (DELTASONE) 10 MG tablet Take 4 tablets (40 mg total) by mouth daily with breakfast. 10/08/16 10/12/16  Renita Papa, PA-C    Family History Family History  Problem Relation Age of Onset  . Hypertension Mother   . Cancer Mother        not sure of type  . Colon  cancer Neg Hx   . Esophageal cancer Neg Hx   . Rectal cancer Neg Hx   . Stomach cancer Neg Hx     Social History Social History  Substance Use Topics  . Smoking status: Never Smoker  . Smokeless tobacco: Never Used  . Alcohol use No     Allergies   Patient has no known allergies.   Review of Systems Review of Systems  Constitutional: Negative for chills and fever.  HENT: Positive for sore throat.   Respiratory: Positive for cough, chest tightness and shortness of breath.   Cardiovascular: Positive for chest pain.  Gastrointestinal: Negative for abdominal pain, diarrhea, nausea and vomiting.     Physical Exam Updated Vital Signs BP 131/86 (BP Location: Right Arm)   Pulse 84   Temp 97.7 F (  36.5 C) (Oral)   Resp 18   SpO2 99%   Physical Exam  Constitutional: She appears well-developed and well-nourished. No distress.  HENT:  Head: Normocephalic and atraumatic.  No frontal or maxillary sinus TTP, TMs without bulging or erythema bilaterally. Nasal septum is midline with pink mucosa and no significant nasal congestion. Posterior pharynx with mild erythema but no significant tonsillar hypertrophy, no uvular deviation, no exudates. No trismus or sublingual abnormalities noted  Eyes: Conjunctivae are normal. Right eye exhibits no discharge. Left eye exhibits no discharge.  Neck: Normal range of motion. Neck supple. No JVD present. No tracheal deviation present. No thyromegaly present.  Left anterior cervical LAD  Cardiovascular: Normal rate, regular rhythm and normal heart sounds.   Pulmonary/Chest: Effort normal. She has wheezes. She exhibits tenderness.  Equal rise and fall of chest, diffuse expiratory wheezing on auscultation of the lung fields. Mild anterior chest wall parasternal TTP.   Abdominal: Soft. She exhibits no distension. There is no tenderness.  Musculoskeletal: She exhibits no edema.  Lymphadenopathy:    She has cervical adenopathy.  Neurological: She is  alert.  Skin: Skin is warm and dry. No erythema.  Psychiatric: She has a normal mood and affect. Her behavior is normal.  Nursing note and vitals reviewed.    ED Treatments / Results  Labs (all labs ordered are listed, but only abnormal results are displayed) Labs Reviewed - No data to display  EKG  EKG Interpretation None       Radiology Dg Chest 2 View  Result Date: 10/08/2016 CLINICAL DATA:  Worsening cough, fatigue EXAM: CHEST  2 VIEW COMPARISON:  Chest x-ray of 10/02/2016, and CT abdomen pelvis of 04/27/2016 FINDINGS: There is new parenchymal opacity at the right lung base possibly in the left lower lobe or lingula most consistent with pneumonia in view of the rapid development. No pleural effusion is seen. Mediastinal and hilar contours are unremarkable. There is some peribronchial thickening which may indicate bronchitis. No acute bony abnormality is seen, with an old partial compression of L1 vertebral body. IMPRESSION: 1. New opacity at the right lung base most consistent with pneumonia probably within the right lower lobe -right middle lobe. No effusion. 2. Old partial compression deformity of L1 vertebral body. Electronically Signed   By: Ivar Drape M.D.   On: 10/08/2016 09:34    Procedures Procedures (including critical care time)  Medications Ordered in ED Medications  ipratropium-albuterol (DUONEB) 0.5-2.5 (3) MG/3ML nebulizer solution 3 mL (3 mLs Nebulization Given 10/08/16 0929)  HYDROcodone-homatropine (HYCODAN) 5-1.5 MG/5ML syrup 5 mL (5 mLs Oral Given 10/08/16 0928)  doxycycline (VIBRA-TABS) tablet 100 mg (100 mg Oral Given 10/08/16 0954)  predniSONE (DELTASONE) tablet 60 mg (60 mg Oral Given 10/08/16 0955)     Initial Impression / Assessment and Plan / ED Course  I have reviewed the triage vital signs and the nursing notes.  Pertinent labs & imaging results that were available during my care of the patient were reviewed by me and considered in my medical  decision making (see chart for details).     Patient with ongoing complaint of cough and shortness of breath. Afebrile, vital signs are stable, SPO2 98% on room air while in the ED. She is nontoxic in appearance. Per Well's criteria she is low risk for PE. Sore throat is not concerning for PTA or Ludwig's angina. No chest pain but endorses chest tightness with her shortness of breath. There is scattered expiratory wheezing on auscultation. Mild improvement with  nebulizer treatment. Chest x-ray shows a new opacity in the right lung base consistent with pneumonia. Doubt pericarditis, myocarditis, or bronchitis. She stable for discharge home with doxycycline, Hycodan, and prednisone pulse. I suspect an asthma exacerbation secondary to her symptoms contributing to her shortness of breath. Patient given Hycodan, first dose of doxycycline and prednisone here in the ED. On reevaluation, patient states she is feeling better. Recommend follow-up with primary care physician for reevaluation. Also recommend her son be evaluated as he had similar symptoms prior to her symptom onset. Discussed indications for return to the ED. Pt verbalized understanding of and agreement with plan and is safe for discharge home at this time.   Final Clinical Impressions(s) / ED Diagnoses   Final diagnoses:  Community acquired pneumonia of right lung, unspecified part of lung  Moderate persistent asthma with acute exacerbation    New Prescriptions Discharge Medication List as of 10/08/2016  9:57 AM    START taking these medications   Details  doxycycline (VIBRAMYCIN) 100 MG capsule Take 1 capsule (100 mg total) by mouth 2 (two) times daily., Starting Thu 10/08/2016, Until Sun 10/18/2016, Print    HYDROcodone-homatropine (HYCODAN) 5-1.5 MG/5ML syrup Take 5 mLs by mouth every 6 (six) hours as needed for cough., Starting Thu 10/08/2016, Print    predniSONE (DELTASONE) 10 MG tablet Take 4 tablets (40 mg total) by mouth daily with  breakfast., Starting Thu 10/08/2016, Until Mon 10/12/2016, Print         Nils Flack, [place], PA-C 10/08/16 [place], [place], DO 10/08/16 1610

## 2016-10-08 NOTE — ED Triage Notes (Signed)
Patient reports being seen here week ago for cough and states that keeping her up at night. Reports cough is productive with yellow phlegm, and causes pain in her anterior head/face and chest. Patient reports,"They going to have to do something different this time that will help with this cough, I can't keep on like this".

## 2016-10-10 ENCOUNTER — Encounter (HOSPITAL_COMMUNITY): Payer: Self-pay | Admitting: Nurse Practitioner

## 2016-10-10 ENCOUNTER — Emergency Department (HOSPITAL_COMMUNITY)
Admission: EM | Admit: 2016-10-10 | Discharge: 2016-10-10 | Disposition: A | Discharge status: Home / Self Care | Payer: Medicaid Other | Attending: Emergency Medicine | Admitting: Emergency Medicine

## 2016-10-10 DIAGNOSIS — J189 Pneumonia, unspecified organism: Secondary | ICD-10-CM | POA: Diagnosis not present

## 2016-10-10 DIAGNOSIS — I10 Essential (primary) hypertension: Secondary | ICD-10-CM | POA: Insufficient documentation

## 2016-10-10 DIAGNOSIS — J9801 Acute bronchospasm: Secondary | ICD-10-CM | POA: Insufficient documentation

## 2016-10-10 DIAGNOSIS — R05 Cough: Secondary | ICD-10-CM | POA: Diagnosis present

## 2016-10-10 LAB — CBC
HEMATOCRIT: 35.2 % — AB (ref 36.0–46.0)
Hemoglobin: 11.5 g/dL — ABNORMAL LOW (ref 12.0–15.0)
MCH: 27.8 pg (ref 26.0–34.0)
MCHC: 32.7 g/dL (ref 30.0–36.0)
MCV: 85.2 fL (ref 78.0–100.0)
Platelets: 324 10*3/uL (ref 150–400)
RBC: 4.13 MIL/uL (ref 3.87–5.11)
RDW: 13.3 % (ref 11.5–15.5)
WBC: 8.6 10*3/uL (ref 4.0–10.5)

## 2016-10-10 LAB — BASIC METABOLIC PANEL
Anion gap: 8 (ref 5–15)
BUN: 11 mg/dL (ref 6–20)
CALCIUM: 8.8 mg/dL — AB (ref 8.9–10.3)
CO2: 27 mmol/L (ref 22–32)
CREATININE: 0.78 mg/dL (ref 0.44–1.00)
Chloride: 101 mmol/L (ref 101–111)
GFR calc non Af Amer: >60 mL/min (ref >60)
Glucose, Bld: 105 mg/dL — ABNORMAL HIGH (ref 65–99)
Potassium: 3.4 mmol/L — ABNORMAL LOW (ref 3.5–5.1)
SODIUM: 136 mmol/L (ref 135–145)

## 2016-10-10 MED ORDER — IPRATROPIUM BROMIDE 0.02 % IN SOLN
0.5000 mg | Freq: Once | RESPIRATORY_TRACT | Status: AC
  Administered 2016-10-10: 0.5 mg via RESPIRATORY_TRACT
  Filled 2016-10-10: qty 2.5

## 2016-10-10 MED ORDER — SODIUM CHLORIDE 0.9 % IV BOLUS (SEPSIS)
1000.0000 mL | Freq: Once | INTRAVENOUS | Status: AC
Start: 2016-10-10 — End: 2016-10-10
  Administered 2016-10-10: 1000 mL via INTRAVENOUS

## 2016-10-10 MED ORDER — ALBUTEROL (5 MG/ML) CONTINUOUS INHALATION SOLN
10.0000 mg/h | INHALATION_SOLUTION | Freq: Once | RESPIRATORY_TRACT | Status: AC
  Administered 2016-10-10: 10 mg/h via RESPIRATORY_TRACT
  Filled 2016-10-10: qty 20

## 2016-10-10 MED ORDER — METHYLPREDNISOLONE SODIUM SUCC 125 MG IJ SOLR
125.0000 mg | Freq: Once | INTRAMUSCULAR | Status: AC
  Administered 2016-10-10: 125 mg via INTRAVENOUS
  Filled 2016-10-10: qty 2

## 2016-10-10 NOTE — ED Triage Notes (Signed)
Patient comes in with c/o generalized weakness, cough, chest discomfort due to cough, and wheezing. She reports being seen 2-3 days ago here and being told she has pneumonia. She reports she feels worst now than she did at that point. She states she is taking all medications as prescribed other than the cough medications. She states that makes her nauseated. She also states she is unable to eat and drink as much as she likes due to the fatigue and coughing. Denies any temperature, vomiting, or diarrhea. A&O x4, she was able to ambulate from triage to room without difficulty, however she reports it hurts when she moves.

## 2016-10-10 NOTE — Discharge Instructions (Signed)
Continue your current medications.  Follow-up with your doctor in the next week or 2 to make sure you are improving

## 2016-10-10 NOTE — ED Provider Notes (Signed)
[place] DEPT Provider Note   CSN: [id number] Arrival date & time: 10/10/16  0740     History   Chief Complaint Chief Complaint  Patient presents with  . Pneumonia  . Cough    HPI Briana Wade is a 46 y.o. female.  HPI Patient presents to the emergency room for evaluation of cough, congestion and shortness of breath.  Patient has been having trouble with cough and congestion this past week.she was seen in the emergency room on September 7 and then on September 13. Initially she was diagnosed with a URI but when she was seen on the 13th for chest x-ray showed an infiltrate consistent with pneumonia. Patient was discharged on oral antibiotics and also medications for an asthma exacerbation. Patient states she is not feeling any better. She continues to cough. At night she feels like she is wheezing more and has shortness of breath. She denies any fevers. No vomiting or diarrhea. She is able to eat and drink. She is able to swallow without difficulty. Past Medical History:  Diagnosis Date  . Abdominal pain, unspecified site   . Allergy   . Anemia   . Asthma   . Chlamydia   . Diverticulitis   . Esophageal reflux   . Hiatal hernia   . Hypertension   . IBS (irritable bowel syndrome)   . Insomnia   . Lactose intolerance   . Migraine   . Other constipation   . Personal history of colonic polyps 09/12/2010   tubular adenoma  . Stroke [facility]) 2014   minor right srm weakness admitted to [facility]  . Trichimoniasis   . Tubular adenoma of colon   . Umbilical hernia   . Uterine fibroid     Patient Active Problem List   Diagnosis Date Noted  . Diverticulosis of sigmoid colon 04/27/2016  . Acute diverticulitis of intestine 04/27/2016  . Diverticula of colon 05/21/2015  . Asthma 05/21/2015  . Acute diverticulitis 05/21/2015  . Diverticulitis of sigmoid colon 06/02/2011  . Nausea and vomiting 06/02/2011  . Otitis 06/02/2011  . HTN (hypertension) 06/02/2011  . GERD  (gastroesophageal reflux disease) 10/21/2010  . Gluten intolerance 10/21/2010  . Personal history of colonic polyps 10/21/2010  . Flatulence, eructation, and gas pain 09/12/2010  . 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 Irritable bowel syndrome 09/12/2010  . Abdominal pain 08/26/2010  . Bloating 08/26/2010  . 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 IBS (irritable bowel syndrome) 08/26/2010  . CONSTIPATION, CHRONIC 05/11/2007  . ABDOMINAL PAIN, CHRONIC 05/11/2007    Past Surgical History:  Procedure Laterality Date  . APPENDECTOMY    . APPENDECTOMY    . COLONOSCOPY    . DILATION AND CURETTAGE OF UTERUS     x2 for EAB  . HYSTEROSCOPY WITH NOVASURE N/A 12/17/2015   Procedure: HYSTEROSCOPY WITH NOVASURE;  Surgeon: Aletha Halim, MD;  Location: [facility];  Service: Gynecology;  Laterality: N/A;  . TUBAL LIGATION      OB History    Gravida Para Term Preterm AB Living   7 3 3   4 3    SAB TAB Ectopic Multiple Live Births   1 3            Obstetric Comments   svd x 3       Home Medications    Prior to Admission medications   Medication Sig Start Date End Date Taking? Authorizing Provider  albuterol (PROVENTIL HFA;VENTOLIN HFA) 108 (90 Base) MCG/ACT inhaler Inhale  1-2 puffs into the lungs every 6 (six) hours as needed for wheezing or shortness of breath. 10/02/16  Yes Varney Biles, MD  benzonatate (TESSALON) 100 MG capsule Take 1 capsule (100 mg total) by mouth every 8 (eight) hours. 10/02/16  Yes Nanavati, Ankit, MD  CARAFATE 1 GM/10ML suspension Take 10 mLs by mouth 4 (four) times daily. 09/30/16  Yes [provider]  doxycycline (VIBRAMYCIN) 100 MG capsule Take 1 capsule (100 mg total) by mouth 2 (two) times daily. 10/08/16 10/18/16 Yes Fawze, Mina A, PA-C  HYDROcodone-homatropine (HYCODAN) 5-1.5 MG/5ML syrup Take 5 mLs by mouth every 6 (six) hours as needed for cough. 10/08/16  Yes Fawze, Mina A, PA-C  losartan (COZAAR)  50 MG tablet Take 50 mg by mouth daily.   Yes [provider]  mometasone (ASMANEX) 220 MCG/INH inhaler Inhale 2 puffs into the lungs daily.   Yes [provider]  predniSONE (DELTASONE) 10 MG tablet Take 4 tablets (40 mg total) by mouth daily with breakfast. 10/08/16 10/12/16 Yes Fawze, Gavin Pound, PA-C    Family History Family History  Problem Relation Age of Onset  . Hypertension Mother   . Cancer Mother        not sure of type  . Colon cancer Neg Hx   . Esophageal cancer Neg Hx   . Rectal cancer Neg Hx   . Stomach cancer Neg Hx     Social History Social History  Substance Use Topics  . Smoking status: Never Smoker  . Smokeless tobacco: Never Used  . Alcohol use No     Allergies   Patient has no known allergies.   Review of Systems Review of Systems  All other systems reviewed and are negative.    Physical Exam Updated Vital Signs BP 138/79 (BP Location: Right Arm)   Pulse 91   Temp 98.2 F (36.8 C) (Oral)   Resp (!) 22   Ht 1.6 m (5\' 3" )   Wt 97.5 kg (215 lb)   SpO2 99%   BMI 38.09 kg/m   Physical Exam  Constitutional: No distress.  HENT:  Head: Normocephalic and atraumatic.  Right Ear: External ear normal.  Left Ear: External ear normal.  Mouth/Throat: No oropharyngeal exudate.  Eyes: Conjunctivae are normal. Right eye exhibits no discharge. Left eye exhibits no discharge. No scleral icterus.  Neck: Neck supple. No tracheal deviation present.  Cardiovascular: Normal rate, regular rhythm and intact distal pulses.   Pulmonary/Chest: Effort normal. No stridor. No respiratory distress. She has wheezes (faintwheezing noted on expiration, good air exchange and full breath sounds). She has no rales.  Abdominal: Soft. Bowel sounds are normal. She exhibits no distension. There is no tenderness. There is no rebound and no guarding.  Musculoskeletal: She exhibits no edema or tenderness.  Neurological: She is alert. She has normal strength. No  cranial nerve deficit (no facial droop, extraocular movements intact, no slurred speech) or sensory deficit. She exhibits normal muscle tone. She displays no seizure activity. Coordination normal.  Skin: Skin is warm and dry. No rash noted. She is not diaphoretic.  Psychiatric: She has a normal mood and affect.  Nursing note and vitals reviewed.    ED Treatments / Results  Labs (all labs ordered are listed, but only abnormal results are displayed) Labs Reviewed  BASIC METABOLIC PANEL - Abnormal; Notable for the following:       Result Value   Potassium 3.4 (*)    Glucose, Bld 105 (*)    Calcium  8.8 (*)    All other components within normal limits  CBC - Abnormal; Notable for the following:    Hemoglobin 11.5 (*)    HCT 35.2 (*)    All other components within normal limits    EKG  EKG Interpretation None       Radiology No results found.  Procedures Procedures (including critical care time)  Medications Ordered in ED Medications  albuterol (PROVENTIL,VENTOLIN) solution continuous neb (10 mg/hr Nebulization Given 10/10/16 0829)  ipratropium (ATROVENT) nebulizer solution 0.5 mg (0.5 mg Nebulization Given 10/10/16 0829)  sodium chloride 0.9 % bolus 1,000 mL (1,000 mLs Intravenous New Bag/Given 10/10/16 0925)  methylPREDNISolone sodium succinate (SOLU-MEDROL) 125 mg/2 mL injection 125 mg (125 mg Intravenous Given 10/10/16 0925)     Initial Impression / Assessment and Plan / ED Course  I have reviewed the triage vital signs and the nursing notes.  Pertinent labs & imaging results that were available during my care of the patient were reviewed by me and considered in my medical decision making (see chart for details).  Clinical Course as of Oct 10 999  Sat Oct 10, 2016  0945 No wheezing noted on repeat exam  [JK]  0959 Peripheral IV discontinued by me.  IV infiltration of ns noted.    [JK]    Clinical Course User Index [JK] Dorie Rank, MD   Patient presented to the  emergency room for persistent cough and congestion associated with her pneumonia. Initially on exam she had mild wheezing. Patient was given an albuterol Atrovent treatment with improvement.  Vtal signs are normal. No hypoxia. She is breathing easily.  Stable for discharge.  Discussed warning signs and precautions and reassured pt it would take some time for her illness to resolve.  Final Clinical Impressions(s) / ED Diagnoses   Final diagnoses:  Community acquired pneumonia, unspecified laterality  Bronchospasm    New Prescriptions New Prescriptions   No medications on file     Dorie Rank, MD 10/10/16 1001

## 2016-10-13 ENCOUNTER — Other Ambulatory Visit: Payer: Self-pay | Admitting: Gastroenterology

## 2016-10-13 DIAGNOSIS — K5732 Diverticulitis of large intestine without perforation or abscess without bleeding: Secondary | ICD-10-CM

## 2016-10-13 DIAGNOSIS — R10814 Left lower quadrant abdominal tenderness: Secondary | ICD-10-CM

## 2016-10-16 ENCOUNTER — Other Ambulatory Visit: Payer: Self-pay | Admitting: Gastroenterology

## 2016-10-16 ENCOUNTER — Ambulatory Visit
Admission: RE | Admit: 2016-10-16 | Discharge: 2016-10-16 | Disposition: A | Discharge status: Home / Self Care | Payer: Medicaid Other | Source: Ambulatory Visit | Attending: Gastroenterology | Admitting: Gastroenterology

## 2016-10-16 DIAGNOSIS — K5732 Diverticulitis of large intestine without perforation or abscess without bleeding: Secondary | ICD-10-CM

## 2016-10-16 DIAGNOSIS — R10814 Left lower quadrant abdominal tenderness: Secondary | ICD-10-CM

## 2016-10-23 ENCOUNTER — Emergency Department (HOSPITAL_COMMUNITY)
Admission: EM | Admit: 2016-10-23 | Discharge: 2016-10-23 | Disposition: A | Discharge status: Home / Self Care | Payer: Medicaid Other | Source: Home / Self Care | Attending: Emergency Medicine | Admitting: Emergency Medicine

## 2016-10-23 ENCOUNTER — Emergency Department (HOSPITAL_COMMUNITY): Payer: Medicaid Other

## 2016-10-23 ENCOUNTER — Encounter (HOSPITAL_COMMUNITY): Payer: Self-pay | Admitting: Emergency Medicine

## 2016-10-23 ENCOUNTER — Encounter (HOSPITAL_COMMUNITY): Payer: Self-pay | Admitting: *Deleted

## 2016-10-23 ENCOUNTER — Emergency Department (HOSPITAL_COMMUNITY)
Admission: EM | Admit: 2016-10-23 | Discharge: 2016-10-23 | Disposition: A | Discharge status: Home / Self Care | Payer: Medicaid Other | Attending: Emergency Medicine | Admitting: Emergency Medicine

## 2016-10-23 DIAGNOSIS — R109 Unspecified abdominal pain: Secondary | ICD-10-CM | POA: Diagnosis present

## 2016-10-23 DIAGNOSIS — K5792 Diverticulitis of intestine, part unspecified, without perforation or abscess without bleeding: Secondary | ICD-10-CM | POA: Insufficient documentation

## 2016-10-23 DIAGNOSIS — D259 Leiomyoma of uterus, unspecified: Secondary | ICD-10-CM | POA: Insufficient documentation

## 2016-10-23 DIAGNOSIS — Z79899 Other long term (current) drug therapy: Secondary | ICD-10-CM | POA: Insufficient documentation

## 2016-10-23 DIAGNOSIS — J45909 Unspecified asthma, uncomplicated: Secondary | ICD-10-CM | POA: Diagnosis not present

## 2016-10-23 DIAGNOSIS — Z8673 Personal history of transient ischemic attack (TIA), and cerebral infarction without residual deficits: Secondary | ICD-10-CM | POA: Diagnosis not present

## 2016-10-23 DIAGNOSIS — I10 Essential (primary) hypertension: Secondary | ICD-10-CM | POA: Insufficient documentation

## 2016-10-23 LAB — CBC WITH DIFFERENTIAL/PLATELET
BASOS ABS: 0 10*3/uL (ref 0.0–0.1)
BASOS PCT: 0 %
EOS ABS: 0 10*3/uL (ref 0.0–0.7)
Eosinophils Relative: 0 %
HCT: 41.1 % (ref 36.0–46.0)
HEMOGLOBIN: 13.2 g/dL (ref 12.0–15.0)
LYMPHS ABS: 1 10*3/uL (ref 0.7–4.0)
Lymphocytes Relative: 9 %
MCH: 27.4 pg (ref 26.0–34.0)
MCHC: 32.1 g/dL (ref 30.0–36.0)
MCV: 85.3 fL (ref 78.0–100.0)
Monocytes Absolute: 0.4 10*3/uL (ref 0.1–1.0)
Monocytes Relative: 4 %
NEUTROS PCT: 87 %
Neutro Abs: 9.7 10*3/uL — ABNORMAL HIGH (ref 1.7–7.7)
Platelets: 357 10*3/uL (ref 150–400)
RBC: 4.82 MIL/uL (ref 3.87–5.11)
RDW: 14.4 % (ref 11.5–15.5)
WBC: 11.1 10*3/uL — AB (ref 4.0–10.5)

## 2016-10-23 LAB — URINALYSIS, ROUTINE W REFLEX MICROSCOPIC
BILIRUBIN URINE: NEGATIVE
Bacteria, UA: NONE SEEN
Bacteria, UA: NONE SEEN
Bilirubin Urine: NEGATIVE
GLUCOSE, UA: NEGATIVE mg/dL
GLUCOSE, UA: NEGATIVE mg/dL
HGB URINE DIPSTICK: NEGATIVE
KETONES UR: NEGATIVE mg/dL
KETONES UR: NEGATIVE mg/dL
LEUKOCYTES UA: NEGATIVE
LEUKOCYTES UA: NEGATIVE
NITRITE: NEGATIVE
Nitrite: NEGATIVE
PH: 5 (ref 5.0–8.0)
PH: 7 (ref 5.0–8.0)
PROTEIN: NEGATIVE mg/dL
PROTEIN: 100 mg/dL — AB
Specific Gravity, Urine: 1.018 (ref 1.005–1.030)
Specific Gravity, Urine: 1.015 (ref 1.005–1.030)

## 2016-10-23 LAB — CBC
HCT: 37.3 % (ref 36.0–46.0)
HEMOGLOBIN: 12.2 g/dL (ref 12.0–15.0)
MCH: 28 pg (ref 26.0–34.0)
MCHC: 32.7 g/dL (ref 30.0–36.0)
MCV: 85.7 fL (ref 78.0–100.0)
PLATELETS: 290 10*3/uL (ref 150–400)
RBC: 4.35 MIL/uL (ref 3.87–5.11)
RDW: 14.3 % (ref 11.5–15.5)
WBC: 8.9 10*3/uL (ref 4.0–10.5)

## 2016-10-23 LAB — LIPASE, BLOOD
LIPASE: 19 U/L (ref 11–51)
LIPASE: 28 U/L (ref 11–51)

## 2016-10-23 LAB — COMPREHENSIVE METABOLIC PANEL
ALK PHOS: 104 U/L (ref 38–126)
ALT: 25 U/L (ref 14–54)
ALT: 27 U/L (ref 14–54)
ANION GAP: 9 (ref 5–15)
ANION GAP: 8 (ref 5–15)
AST: 19 U/L (ref 15–41)
AST: 28 U/L (ref 15–41)
Albumin: 3.7 g/dL (ref 3.5–5.0)
Albumin: 4.3 g/dL (ref 3.5–5.0)
Alkaline Phosphatase: 93 U/L (ref 38–126)
BUN: 10 mg/dL (ref 6–20)
BUN: 5 mg/dL — ABNORMAL LOW (ref 6–20)
CALCIUM: 9.8 mg/dL (ref 8.9–10.3)
CHLORIDE: 103 mmol/L (ref 101–111)
CO2: 24 mmol/L (ref 22–32)
CO2: 27 mmol/L (ref 22–32)
Calcium: 8.9 mg/dL (ref 8.9–10.3)
Chloride: 98 mmol/L — ABNORMAL LOW (ref 101–111)
Creatinine, Ser: 0.71 mg/dL (ref 0.44–1.00)
Creatinine, Ser: 0.79 mg/dL (ref 0.44–1.00)
GFR calc non Af Amer: >60 mL/min (ref >60)
Glucose, Bld: 127 mg/dL — ABNORMAL HIGH (ref 65–99)
Glucose, Bld: 116 mg/dL — ABNORMAL HIGH (ref 65–99)
Potassium: 3.6 mmol/L (ref 3.5–5.1)
Potassium: 4.5 mmol/L (ref 3.5–5.1)
SODIUM: 136 mmol/L (ref 135–145)
Sodium: 133 mmol/L — ABNORMAL LOW (ref 135–145)
Total Bilirubin: 0.4 mg/dL (ref 0.3–1.2)
Total Bilirubin: 1.1 mg/dL (ref 0.3–1.2)
Total Protein: 7.4 g/dL (ref 6.5–8.1)
Total Protein: 8.8 g/dL — ABNORMAL HIGH (ref 6.5–8.1)

## 2016-10-23 LAB — I-STAT CG4 LACTIC ACID, ED: LACTIC ACID, VENOUS: 1.87 mmol/L (ref 0.5–1.9)

## 2016-10-23 MED ORDER — METRONIDAZOLE 500 MG PO TABS: 500.0000 mg | ORAL_TABLET | Freq: Two times a day (BID) | ORAL | 0 refills | Status: DC

## 2016-10-23 MED ORDER — HYDROCODONE-ACETAMINOPHEN 5-325 MG PO TABS: 1.0000 | ORAL_TABLET | ORAL | 0 refills | Status: DC | PRN

## 2016-10-23 MED ORDER — ONDANSETRON HCL 4 MG/2ML IJ SOLN
4.0000 mg | Freq: Once | INTRAMUSCULAR | Status: AC
  Administered 2016-10-23: 4 mg via INTRAVENOUS
  Filled 2016-10-23: qty 2

## 2016-10-23 MED ORDER — IOPAMIDOL (ISOVUE-300) INJECTION 61%
INTRAVENOUS | Status: AC
  Administered 2016-10-23: 100 mL
  Filled 2016-10-23: qty 100

## 2016-10-23 MED ORDER — ONDANSETRON 4 MG PO TBDP: ORAL_TABLET | ORAL | 0 refills | Status: DC

## 2016-10-23 MED ORDER — MORPHINE SULFATE (PF) 4 MG/ML IV SOLN
4.0000 mg | Freq: Once | INTRAVENOUS | Status: AC
  Administered 2016-10-23: 4 mg via INTRAVENOUS
  Filled 2016-10-23: qty 1

## 2016-10-23 MED ORDER — CIPROFLOXACIN HCL 500 MG PO TABS: 500.0000 mg | ORAL_TABLET | Freq: Two times a day (BID) | ORAL | 0 refills | Status: DC

## 2016-10-23 MED ORDER — SODIUM CHLORIDE 0.9 % IV SOLN
Freq: Once | INTRAVENOUS | Status: AC
  Administered 2016-10-23: 16:00:00 via INTRAVENOUS

## 2016-10-23 NOTE — ED Provider Notes (Signed)
[place] DEPT Provider Note   CSN: [id number] Arrival date & time: 10/23/16  3235     History   Chief Complaint Chief Complaint  Patient presents with  . Abdominal Pain  . Back Pain    HPI Briana Wade is a 46 y.o. female.  HPI Patient reports that her abdominal pain has been worsening since she was earlier diagnosed with diverticulitis on 9\21. She was after that diagnosis, she was not able to fill her antibiotics. She reports the pain is very severe and it now has moved from her left lower quadrant to her right lower quadrant. Developed nausea and vomiting. Patient reports chills. Patient denies pain burning with urination or vaginal discharge. Patient reports that she's had problems with diverticulitis in the past and she feels that this was going on now. Past Medical History:  Diagnosis Date  . Abdominal pain, unspecified site   . Allergy   . Anemia   . Asthma   . Chlamydia   . Diverticulitis   . Esophageal reflux   . Hiatal hernia   . Hypertension   . IBS (irritable bowel syndrome)   . Insomnia   . Lactose intolerance   . Migraine   . Other constipation   . Personal history of colonic polyps 09/12/2010   tubular adenoma  . Stroke [facility]) 2014   minor right srm weakness admitted to [facility]  . Trichimoniasis   . Tubular adenoma of colon   . Umbilical hernia   . Uterine fibroid     Patient Active Problem List   Diagnosis Date Noted  . Diverticulosis of sigmoid colon 04/27/2016  . Acute diverticulitis of intestine 04/27/2016  . Diverticula of colon 05/21/2015  . Asthma 05/21/2015  . Acute diverticulitis 05/21/2015  . Diverticulitis of sigmoid colon 06/02/2011  . Nausea and vomiting 06/02/2011  . Otitis 06/02/2011  . HTN (hypertension) 06/02/2011  . GERD (gastroesophageal reflux disease) 10/21/2010  . Gluten intolerance 10/21/2010  . Personal history of colonic polyps 10/21/2010  . Flatulence, eructation, and gas pain 09/12/2010  . 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 Irritable bowel syndrome 09/12/2010  . Abdominal pain 08/26/2010  . Bloating 08/26/2010  . 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 IBS (irritable bowel syndrome) 08/26/2010  . CONSTIPATION, CHRONIC 05/11/2007  . ABDOMINAL PAIN, CHRONIC 05/11/2007    Past Surgical History:  Procedure Laterality Date  . APPENDECTOMY    . APPENDECTOMY    . COLONOSCOPY    . DILATION AND CURETTAGE OF UTERUS     x2 for EAB  . HYSTEROSCOPY WITH NOVASURE N/A 12/17/2015   Procedure: HYSTEROSCOPY WITH NOVASURE;  Surgeon: Aletha Halim, MD;  Location: [facility];  Service: Gynecology;  Laterality: N/A;  . TUBAL LIGATION      OB History    Gravida Para Term Preterm AB Living   7 3 3   4 3    SAB TAB Ectopic Multiple Live Births   1 3            Obstetric Comments   svd x 3       Home Medications    Prior to Admission medications   Medication Sig Start Date End Date Taking? Authorizing Provider  albuterol (PROVENTIL HFA;VENTOLIN HFA) 108 (90 Base) MCG/ACT inhaler Inhale 1-2 puffs into the lungs every 6 (six) hours as needed for wheezing or shortness of breath. 10/02/16  Yes Nanavati, Ankit, MD  HYDROcodone-homatropine (HYCODAN) 5-1.5 MG/5ML syrup Take 5 mLs by mouth every 6 (  six) hours as needed for cough. 10/08/16  Yes Fawze, Mina A, PA-C  ibuprofen (ADVIL,MOTRIN) 200 MG tablet Take 200 mg by mouth every 6 (six) hours as needed for moderate pain.   Yes [provider]  losartan (COZAAR) 50 MG tablet Take 50 mg by mouth daily.   Yes [provider]  benzonatate (TESSALON) 100 MG capsule Take 1 capsule (100 mg total) by mouth every 8 (eight) hours. Patient not taking: Reported on 10/23/2016 10/02/16   Varney Biles, MD  ciprofloxacin (CIPRO) 500 MG tablet Take 1 tablet (500 mg total) by mouth 2 (two) times daily. 10/23/16   Charlesetta Shanks, MD  HYDROcodone-acetaminophen (NORCO/VICODIN) 5-325 MG tablet Take 1-2  tablets by mouth every 4 (four) hours as needed for moderate pain or severe pain. 10/23/16   Charlesetta Shanks, MD  metroNIDAZOLE (FLAGYL) 500 MG tablet Take 1 tablet (500 mg total) by mouth 2 (two) times daily. One po bid x 7 days 10/23/16   Charlesetta Shanks, MD  ondansetron (ZOFRAN ODT) 4 MG disintegrating tablet 4mg  ODT q4 hours prn nausea/vomit 10/23/16   Charlesetta Shanks, MD    Family History Family History  Problem Relation Age of Onset  . Hypertension Mother   . Cancer Mother        not sure of type  . Colon cancer Neg Hx   . Esophageal cancer Neg Hx   . Rectal cancer Neg Hx   . Stomach cancer Neg Hx     Social History Social History  Substance Use Topics  . Smoking status: Never Smoker  . Smokeless tobacco: Never Used  . Alcohol use No     Allergies   Patient has no known allergies.   Review of Systems Review of Systems 10 Systems reviewed and are negative for acute change except as noted in the HPI.   Physical Exam Updated Vital Signs BP (!) 156/95   Pulse 89   Temp 97.6 F (36.4 C)   Resp 18   Ht 5\' 3"  (1.6 m)   Wt 97.5 kg (215 lb)   SpO2 99%   BMI 38.09 kg/m   Physical Exam  Constitutional: She is oriented to person, place, and time. She appears well-developed and well-nourished. She appears distressed.  Patient is alert and nontoxic. She is tearful. He appears to be in moderate pain.  HENT:  Head: Normocephalic and atraumatic.  Eyes: Conjunctivae and EOM are normal.  Neck: Neck supple.  Cardiovascular: Normal rate and regular rhythm.   No murmur heard. Pulmonary/Chest: Effort normal and breath sounds normal. No respiratory distress.  Abdominal: Soft. There is tenderness.  Patient was a severe tenderness in right lower quadrant and mid lower abdomen.  Musculoskeletal: Normal range of motion. She exhibits no edema or tenderness.  Neurological: She is alert and oriented to person, place, and time. No cranial nerve deficit. She exhibits normal muscle  tone. Coordination normal.  Skin: Skin is warm and dry.  Psychiatric: She has a normal mood and affect.  Nursing note and vitals reviewed.    ED Treatments / Results  Labs (all labs ordered are listed, but only abnormal results are displayed) Labs Reviewed  COMPREHENSIVE METABOLIC PANEL - Abnormal; Notable for the following:       Result Value   Sodium 133 (*)    Chloride 98 (*)    Glucose, Bld 116 (*)    BUN 5 (*)    Total Protein 8.8 (*)    All other components within normal limits  CBC WITH DIFFERENTIAL/PLATELET - Abnormal; Notable for the following:    WBC 11.1 (*)    Neutro Abs 9.7 (*)    All other components within normal limits  URINALYSIS, ROUTINE W REFLEX MICROSCOPIC - Abnormal; Notable for the following:    Protein, ur 100 (*)    Squamous Epithelial / LPF 0-5 (*)    All other components within normal limits  LIPASE, BLOOD  I-STAT CG4 LACTIC ACID, ED    EKG  EKG Interpretation None       Radiology Ct Abdomen Pelvis W Contrast  Result Date: 10/23/2016 CLINICAL DATA:  Abdominal pain and low back pain. Suspected diverticulitis. EXAM: CT ABDOMEN AND PELVIS WITH CONTRAST TECHNIQUE: Multidetector CT imaging of the abdomen and pelvis was performed using the standard protocol following bolus administration of intravenous contrast. CONTRAST:  165mL ISOVUE-300 IOPAMIDOL (ISOVUE-300) INJECTION 61% COMPARISON:  10/16/2016 FINDINGS: Lower chest: Minimal atelectasis in the lung bases. Hepatobiliary: No focal liver abnormality is seen. No gallstones, gallbladder wall thickening, or biliary dilatation. Pancreas: Unremarkable. Spleen: Unremarkable. Adrenals/Urinary Tract: Unremarkable adrenal glands. No evidence of renal mass, calculi, or hydronephrosis. Unremarkable bladder. Stomach/Bowel: Unchanged moderate-sized sliding hiatal hernia. No evidence of bowel obstruction. Diverticulosis predominantly involving the left colon. There is persistent mild pericolonic stranding about the  proximal sigmoid colon with evidence of wall thickening. The degree of surrounding inflammation has not significantly changed from the recent prior CT. There is no evidence of perforation or abscess. Prior appendectomy. Vascular/Lymphatic: Normal caliber of the abdominal aorta. No enlarged lymph nodes. Reproductive: Multiple uterine masses corresponding to known fibroids, previously evaluated with ultrasound. No adnexal mass. Other: No intraperitoneal free fluid. Small fat containing umbilical hernia. Musculoskeletal: Chronic mild L1 compression fracture. IMPRESSION: 1. Persistent mild proximal sigmoid diverticulitis.  No abscess. 2. Hiatal hernia. 3. Uterine fibroids. Electronically Signed   By: Logan Bores M.D.   On: 10/23/2016 16:20    Procedures Procedures (including critical care time)  Medications Ordered in ED Medications  morphine 4 MG/ML injection 4 mg (4 mg Intravenous Given 10/23/16 1528)  ondansetron (ZOFRAN) injection 4 mg (4 mg Intravenous Given 10/23/16 1527)  0.9 %  sodium chloride infusion ( Intravenous New Bag/Given 10/23/16 1530)  iopamidol (ISOVUE-300) 61 % injection (100 mLs  Contrast Given 10/23/16 1551)     Initial Impression / Assessment and Plan / ED Course  I have reviewed the triage vital signs and the nursing notes.  Pertinent labs & imaging results that were available during my care of the patient were reviewed by me and considered in my medical decision making (see chart for details).     Recheck: [place] patient is sleeping quietly. Vital signs are stable. Have reviewed diagnostic results.  Final Clinical Impressions(s) / ED Diagnoses   Final diagnoses:  Diverticulitis  Uterine leiomyoma, unspecified location  Patient reports she is in the process of getting scheduled for colonoscopy. CT only shows limited area of uncomplicated diverticulitis. At this time feel she is stable for outpatient treatment. He is however advises it is important for her to complete  evaluation with colonoscopy. Also a consideration is given the patient's pain may be related to uterine fibroids. This was reviewed with the patient and she is counselled to follow-up regarding this finding.  New Prescriptions New Prescriptions   CIPROFLOXACIN (CIPRO) 500 MG TABLET    Take 1 tablet (500 mg total) by mouth 2 (two) times daily.   HYDROCODONE-ACETAMINOPHEN (NORCO/VICODIN) 5-325 MG TABLET    Take 1-2 tablets by mouth every 4 (  four) hours as needed for moderate pain or severe pain.   METRONIDAZOLE (FLAGYL) 500 MG TABLET    Take 1 tablet (500 mg total) by mouth 2 (two) times daily. One po bid x 7 days   ONDANSETRON (ZOFRAN ODT) 4 MG DISINTEGRATING TABLET    4mg  ODT q4 hours prn nausea/vomit     Charlesetta Shanks, MD 10/23/16 1650

## 2016-10-23 NOTE — ED Triage Notes (Signed)
Pt is here with right lower abdominal and back pain and states she knows it is her diverticulitis.  Pt reports vomiting.  Crying at triage.  States she was at [facility] and did not see anyone so she left and came here

## 2016-10-23 NOTE — ED Notes (Signed)
Patient transported to CT 

## 2016-10-23 NOTE — ED Triage Notes (Signed)
Patient complaining of abdominal pain and lower back pain. Patient states she thinks it is her diverticulitis. Patient was very sleepy and was barely answering questions.

## 2016-10-23 NOTE — ED Notes (Signed)
Pt returned from CT °

## 2016-10-23 NOTE — ED Notes (Signed)
Department received a call that Pt is now at [facility].

## 2016-12-02 ENCOUNTER — Ambulatory Visit: Payer: Self-pay | Admitting: Obstetrics and Gynecology

## 2016-12-03 ENCOUNTER — Encounter: Payer: Self-pay | Admitting: Obstetrics and Gynecology

## 2016-12-03 NOTE — Progress Notes (Signed)
Patient did not keep GYN appointment for 12/02/2016.  Durene Romans MD Attending Center for [employer])

## 2017-01-22 ENCOUNTER — Encounter (HOSPITAL_COMMUNITY): Payer: Self-pay | Admitting: Emergency Medicine

## 2017-01-22 ENCOUNTER — Emergency Department (HOSPITAL_COMMUNITY)
Admission: EM | Admit: 2017-01-22 | Discharge: 2017-01-22 | Discharge status: Against Medical Advice | Payer: Medicaid Other | Attending: Emergency Medicine | Admitting: Emergency Medicine

## 2017-01-22 ENCOUNTER — Other Ambulatory Visit: Payer: Self-pay

## 2017-01-22 DIAGNOSIS — R109 Unspecified abdominal pain: Secondary | ICD-10-CM | POA: Diagnosis not present

## 2017-01-22 DIAGNOSIS — Z5321 Procedure and treatment not carried out due to patient leaving prior to being seen by health care provider: Secondary | ICD-10-CM | POA: Insufficient documentation

## 2017-01-22 NOTE — ED Notes (Signed)
Pt called for room, no response from lobby 

## 2017-01-22 NOTE — ED Triage Notes (Signed)
Pt called,no answer.

## 2017-01-22 NOTE — ED Notes (Signed)
Pt called from the lobby with no response 

## 2017-01-22 NOTE — ED Triage Notes (Addendum)
Pt c/o generalized abdominal pain x 10 hours. Pt denies n/v/d and denies any urinary symptoms. Pt states she began spotting / vaginal bleeding today and hasn't had a period in 6-7 months due to ablation and patient states she has a hx of diverticulosis.

## 2017-01-23 ENCOUNTER — Other Ambulatory Visit: Payer: Self-pay

## 2017-01-23 ENCOUNTER — Emergency Department (HOSPITAL_COMMUNITY): Payer: Medicaid Other

## 2017-01-23 ENCOUNTER — Emergency Department (HOSPITAL_COMMUNITY)
Admission: EM | Admit: 2017-01-23 | Discharge: 2017-01-23 | Disposition: A | Discharge status: Home / Self Care | Payer: Medicaid Other | Attending: Physician Assistant | Admitting: Physician Assistant

## 2017-01-23 ENCOUNTER — Encounter (HOSPITAL_COMMUNITY): Payer: Self-pay | Admitting: Emergency Medicine

## 2017-01-23 DIAGNOSIS — Z79899 Other long term (current) drug therapy: Secondary | ICD-10-CM | POA: Insufficient documentation

## 2017-01-23 DIAGNOSIS — J45909 Unspecified asthma, uncomplicated: Secondary | ICD-10-CM | POA: Insufficient documentation

## 2017-01-23 DIAGNOSIS — I10 Essential (primary) hypertension: Secondary | ICD-10-CM | POA: Insufficient documentation

## 2017-01-23 DIAGNOSIS — R1030 Lower abdominal pain, unspecified: Secondary | ICD-10-CM

## 2017-01-23 LAB — CBC
HEMATOCRIT: 39.1 % (ref 36.0–46.0)
Hemoglobin: 12.9 g/dL (ref 12.0–15.0)
MCH: 28.4 pg (ref 26.0–34.0)
MCHC: 33 g/dL (ref 30.0–36.0)
MCV: 85.9 fL (ref 78.0–100.0)
PLATELETS: 277 10*3/uL (ref 150–400)
RBC: 4.55 MIL/uL (ref 3.87–5.11)
RDW: 13.5 % (ref 11.5–15.5)
WBC: 5 10*3/uL (ref 4.0–10.5)

## 2017-01-23 LAB — URINALYSIS, ROUTINE W REFLEX MICROSCOPIC
Bilirubin Urine: NEGATIVE
Glucose, UA: NEGATIVE mg/dL
Ketones, ur: NEGATIVE mg/dL
LEUKOCYTES UA: NEGATIVE
NITRITE: NEGATIVE
PROTEIN: NEGATIVE mg/dL
SPECIFIC GRAVITY, URINE: 1.012 (ref 1.005–1.030)
pH: 7 (ref 5.0–8.0)

## 2017-01-23 LAB — COMPREHENSIVE METABOLIC PANEL
ALT: 11 U/L — ABNORMAL LOW (ref 14–54)
AST: 19 U/L (ref 15–41)
Albumin: 3.9 g/dL (ref 3.5–5.0)
Alkaline Phosphatase: 89 U/L (ref 38–126)
Anion gap: 12 (ref 5–15)
BILIRUBIN TOTAL: 0.7 mg/dL (ref 0.3–1.2)
BUN: 5 mg/dL — AB (ref 6–20)
CO2: 23 mmol/L (ref 22–32)
Calcium: 8.9 mg/dL (ref 8.9–10.3)
Chloride: 100 mmol/L — ABNORMAL LOW (ref 101–111)
Creatinine, Ser: 0.78 mg/dL (ref 0.44–1.00)
GFR calc Af Amer: >60 mL/min (ref >60)
Glucose, Bld: 130 mg/dL — ABNORMAL HIGH (ref 65–99)
POTASSIUM: 3.2 mmol/L — AB (ref 3.5–5.1)
Sodium: 135 mmol/L (ref 135–145)
TOTAL PROTEIN: 7.8 g/dL (ref 6.5–8.1)

## 2017-01-23 LAB — I-STAT BETA HCG BLOOD, ED (MC, WL, AP ONLY)

## 2017-01-23 LAB — LIPASE, BLOOD: Lipase: 22 U/L (ref 11–51)

## 2017-01-23 MED ORDER — MORPHINE SULFATE (PF) 4 MG/ML IV SOLN
4.0000 mg | Freq: Once | INTRAVENOUS | Status: AC
  Administered 2017-01-23: 4 mg via INTRAVENOUS
  Filled 2017-01-23: qty 1

## 2017-01-23 MED ORDER — IOPAMIDOL (ISOVUE-300) INJECTION 61%
INTRAVENOUS | Status: AC
  Administered 2017-01-23: 100 mL
  Filled 2017-01-23: qty 100

## 2017-01-23 MED ORDER — ONDANSETRON HCL 4 MG/2ML IJ SOLN
2.0000 mg | Freq: Once | INTRAMUSCULAR | Status: AC
  Administered 2017-01-23: 2 mg via INTRAVENOUS
  Filled 2017-01-23: qty 2

## 2017-01-23 MED ORDER — ONDANSETRON 4 MG PO TBDP
8.0000 mg | ORAL_TABLET | Freq: Once | ORAL | Status: AC
  Administered 2017-01-23: 8 mg via ORAL
  Filled 2017-01-23: qty 2

## 2017-01-23 MED ORDER — ONDANSETRON HCL 4 MG/2ML IJ SOLN
4.0000 mg | Freq: Once | INTRAMUSCULAR | Status: AC
  Administered 2017-01-23: 4 mg via INTRAVENOUS
  Filled 2017-01-23: qty 2

## 2017-01-23 MED ORDER — ONDANSETRON 4 MG PO TBDP: 4.0000 mg | ORAL_TABLET | Freq: Three times a day (TID) | ORAL | 0 refills | Status: DC | PRN

## 2017-01-23 NOTE — ED Notes (Signed)
Pt has not taken BP meds today.  

## 2017-01-23 NOTE — ED Triage Notes (Signed)
Pt c/o abd pain x 24 hours, generalized, +nausea, emesis x 1.

## 2017-01-23 NOTE — ED Provider Notes (Signed)
[facility] EMERGENCY DEPARTMENT Provider Note   CSN: [id number] Arrival date & time: 01/23/17  0141     History   Chief Complaint Chief Complaint  Patient presents with  . Abdominal Pain    HPI Briana Wade is a 46 y.o. female.  HPI   46 year old female with past medical history significant for diverticulitis, IBS, lactose intolerance.  Briana Wade is presenting today with diffuse abdominal pain.  Patient reports is been going on for the last several days.  Briana Wade says it feels like her usual diverticulitis.  Briana Wade is unable to point where the pain is Briana Wade.  Briana Wade says is both in the right and left lower quadrant.  Briana Wade has had no bowel symptoms.  No vomiting.  No fevers.  Patient is tearful about the pain.  No bowel surgeries.  No recent travel.  Past Medical History:  Diagnosis Date  . Abdominal pain, unspecified site   . Allergy   . Anemia   . Asthma   . Chlamydia   . Diverticulitis   . Esophageal reflux   . Hiatal hernia   . Hypertension   . IBS (irritable bowel syndrome)   . Insomnia   . Lactose intolerance   . Migraine   . Other constipation   . Personal history of colonic polyps 09/12/2010   tubular adenoma  . Stroke [facility]) 2014   minor right srm weakness admitted to [facility]  . Trichimoniasis   . Tubular adenoma of colon   . Umbilical hernia   . Uterine fibroid     Patient Active Problem List   Diagnosis Date Noted  . Diverticulosis of sigmoid colon 04/27/2016  . Acute diverticulitis of intestine 04/27/2016  . Diverticula of colon 05/21/2015  . Asthma 05/21/2015  . Acute diverticulitis 05/21/2015  . Diverticulitis of sigmoid colon 06/02/2011  . Nausea and vomiting 06/02/2011  . Otitis 06/02/2011  . HTN (hypertension) 06/02/2011  . GERD (gastroesophageal reflux disease) 10/21/2010  . Gluten intolerance 10/21/2010  . Personal history of colonic polyps 10/21/2010  . Flatulence, eructation, and gas pain 09/12/2010  . 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 Irritable bowel syndrome 09/12/2010  . Abdominal pain 08/26/2010  . Bloating 08/26/2010  . 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 IBS (irritable bowel syndrome) 08/26/2010  . CONSTIPATION, CHRONIC 05/11/2007  . ABDOMINAL PAIN, CHRONIC 05/11/2007    Past Surgical History:  Procedure Laterality Date  . APPENDECTOMY    . APPENDECTOMY    . COLONOSCOPY    . DILATION AND CURETTAGE OF UTERUS     x2 for EAB  . HYSTEROSCOPY WITH NOVASURE N/A 12/17/2015   Procedure: HYSTEROSCOPY WITH NOVASURE;  Surgeon: Aletha Halim, MD;  Location: [facility];  Service: Gynecology;  Laterality: N/A;  . TUBAL LIGATION      OB History    Gravida Para Term Preterm AB Living   7 3 3   4 3    SAB TAB Ectopic Multiple Live Births   1 3            Obstetric Comments   svd x 3       Home Medications    Prior to Admission medications   Medication Sig Start Date End Date Taking? Authorizing Provider  albuterol (PROVENTIL HFA;VENTOLIN HFA) 108 (90 Base) MCG/ACT inhaler Inhale 1-2 puffs into the lungs every 6 (six) hours as needed for wheezing or shortness of breath. 10/02/16   Varney Biles, MD  benzonatate (TESSALON) 100 MG capsule  Take 1 capsule (100 mg total) by mouth every 8 (eight) hours. Patient not taking: Reported on 10/23/2016 10/02/16   Varney Biles, MD  ciprofloxacin (CIPRO) 500 MG tablet Take 1 tablet (500 mg total) by mouth 2 (two) times daily. 10/23/16   Charlesetta Shanks, MD  HYDROcodone-acetaminophen (NORCO/VICODIN) 5-325 MG tablet Take 1-2 tablets by mouth every 4 (four) hours as needed for moderate pain or severe pain. 10/23/16   Charlesetta Shanks, MD  HYDROcodone-homatropine [facility]) 5-1.5 MG/5ML syrup Take 5 mLs by mouth every 6 (six) hours as needed for cough. 10/08/16   Fawze, Mina A, PA-C  ibuprofen (ADVIL,MOTRIN) 200 MG tablet Take 200 mg by mouth every 6 (six) hours as needed for moderate pain.    [provider]  losartan (COZAAR) 50 MG tablet Take 50 mg by mouth daily.    [provider]  metroNIDAZOLE (FLAGYL) 500 MG tablet Take 1 tablet (500 mg total) by mouth 2 (two) times daily. One po bid x 7 days 10/23/16   Charlesetta Shanks, MD  ondansetron (ZOFRAN ODT) 4 MG disintegrating tablet 4mg  ODT q4 hours prn nausea/vomit 10/23/16   Charlesetta Shanks, MD    Family History Family History  Problem Relation Age of Onset  . Hypertension Mother   . Cancer Mother        not sure of type  . Colon cancer Neg Hx   . Esophageal cancer Neg Hx   . Rectal cancer Neg Hx   . Stomach cancer Neg Hx     Social History Social History   Tobacco Use  . Smoking status: Never Smoker  . Smokeless tobacco: Never Used  Substance Use Topics  . Alcohol use: No  . Drug use: No     Allergies   Patient has no known allergies.   Review of Systems Review of Systems  Constitutional: Negative for activity change and fever.  Respiratory: Negative for shortness of breath.   Cardiovascular: Negative for chest pain.  Gastrointestinal: Positive for abdominal pain and nausea. Negative for blood in stool and diarrhea.  All other systems reviewed and are negative.    Physical Exam Updated Vital Signs BP (!) 181/117 (BP Location: Left Arm)   Pulse 84   Temp 98.9 F (37.2 C) (Oral)   Resp 20   Ht 5' (1.524 m)   Wt 96.6 kg (213 lb)   LMP 01/22/2017 (Exact Date)   SpO2 98%   BMI 41.60 kg/m   Physical Exam  Constitutional: Briana Wade is oriented to person, place, and time. Briana Wade appears well-developed and well-nourished.  HENT:  Head: Normocephalic and atraumatic.  Eyes: Right eye exhibits no discharge.  Cardiovascular: Normal rate, regular rhythm and normal heart sounds.  No murmur heard. Pulmonary/Chest: Effort normal and breath sounds normal. Briana Wade has no wheezes. Briana Wade has no rales.  Abdominal: Soft. Bowel sounds are normal. Briana Wade exhibits no distension. There is tenderness.  Diffuse tenderness.  To  light touch, nonfocal.  Neurological: Briana Wade is oriented to person, place, and time.  Skin: Skin is warm and dry. Briana Wade is not diaphoretic.  Psychiatric: Briana Wade has a normal mood and affect.  Nursing note and vitals reviewed.    ED Treatments / Results  Labs (all labs ordered are listed, but only abnormal results are displayed) Labs Reviewed  COMPREHENSIVE METABOLIC PANEL - Abnormal; Notable for the following components:      Result Value   Potassium 3.2 (*)    Chloride 100 (*)    Glucose, Bld 130 (*)  BUN 5 (*)    ALT 11 (*)    All other components within normal limits  URINALYSIS, ROUTINE W REFLEX MICROSCOPIC - Abnormal; Notable for the following components:   Color, Urine STRAW (*)    Hgb urine dipstick LARGE (*)    Bacteria, UA RARE (*)    Squamous Epithelial / LPF 0-5 (*)    All other components within normal limits  LIPASE, BLOOD  CBC  I-STAT BETA HCG BLOOD, ED ([facility], [facility], [facility] ONLY)    EKG  EKG Interpretation  Date/Time:  Saturday January 23 2017 02:55:09 EST Ventricular Rate:  83 PR Interval:  154 QRS Duration: 68 QT Interval:  414 QTC Calculation: 486 R Axis:   -11 Text Interpretation:  Normal sinus rhythm Moderate voltage criteria for LVH, may be normal variant Borderline ECG Normal sinus rhythm Confirmed by Thomasene Lot, Gow[place] ([id number]) on 01/23/2017 9:35:27 AM       Radiology No results found.  Procedures Procedures (including critical care time)  Medications Ordered in ED Medications  morphine 4 MG/ML injection 4 mg (not administered)  ondansetron (ZOFRAN) injection 4 mg (not administered)  ondansetron (ZOFRAN-ODT) disintegrating tablet 8 mg (8 mg Oral Given 01/23/17 0250)     Initial Impression / Assessment and Plan / ED Course  I have reviewed the triage vital signs and the nursing notes.  Pertinent labs & imaging results that were available during my care of the patient were reviewed by me and considered in my medical decision making (see chart for  details).     46 year old female with past medical history significant for diverticulitis, IBS, lactose intolerance.  Briana Wade is presenting today with diffuse abdominal pain.  Patient reports is been going on for the last several days.  Briana Wade says it feels like her usual diverticulitis.  Briana Wade is unable to point where the pain is Briana Wade.  Briana Wade says is both in the right and left lower quadrant.  Briana Wade has had no bowel symptoms.  No vomiting.  No fevers.  Patient is tearful about the pain.  No bowel surgeries.  No recent travel.  9:57 AM Will get stat CT abdomen pelvis.  Suspect diverticulitis.  Patient appears very well with normal vital signs and reassuring labs.  CT normal. Normal labs and vitals. Will have her watch for change in symptoms at home. Taking PO and ambulatory prior to discharge   Final Clinical Impressions(s) / ED Diagnoses   Final diagnoses:  None    ED Discharge Orders    None       Macarthur Critchley, MD 01/23/17 1614

## 2017-01-23 NOTE — Discharge Instructions (Signed)
Your labs vital signs and CAT scan are reassuring.  It is possible that your abdominal pain could be due to cramping.  We will need to continue to monitor your symptoms.  We will give you some nausea medication in case you have continued nausea.  Please return with fever, unilateral pain, or other concerns.

## 2017-01-23 NOTE — ED Notes (Signed)
Pt given cup of water 

## 2017-03-13 ENCOUNTER — Emergency Department (HOSPITAL_COMMUNITY)
Admission: EM | Admit: 2017-03-13 | Discharge: 2017-03-13 | Disposition: A | Discharge status: Home / Self Care | Payer: Medicaid Other | Attending: Emergency Medicine | Admitting: Emergency Medicine

## 2017-03-13 ENCOUNTER — Other Ambulatory Visit: Payer: Self-pay

## 2017-03-13 ENCOUNTER — Encounter (HOSPITAL_COMMUNITY): Payer: Self-pay | Admitting: *Deleted

## 2017-03-13 DIAGNOSIS — Z5321 Procedure and treatment not carried out due to patient leaving prior to being seen by health care provider: Secondary | ICD-10-CM | POA: Insufficient documentation

## 2017-03-13 DIAGNOSIS — R109 Unspecified abdominal pain: Secondary | ICD-10-CM | POA: Diagnosis present

## 2017-03-13 DIAGNOSIS — R112 Nausea with vomiting, unspecified: Secondary | ICD-10-CM | POA: Insufficient documentation

## 2017-03-13 LAB — COMPREHENSIVE METABOLIC PANEL
ALBUMIN: 4 g/dL (ref 3.5–5.0)
ALK PHOS: 113 U/L (ref 38–126)
ALT: 14 U/L (ref 14–54)
ANION GAP: 13 (ref 5–15)
AST: 16 U/L (ref 15–41)
BILIRUBIN TOTAL: 0.9 mg/dL (ref 0.3–1.2)
BUN: 6 mg/dL (ref 6–20)
CALCIUM: 9.5 mg/dL (ref 8.9–10.3)
CO2: 22 mmol/L (ref 22–32)
Chloride: 101 mmol/L (ref 101–111)
Creatinine, Ser: 0.76 mg/dL (ref 0.44–1.00)
GFR calc Af Amer: >60 mL/min (ref >60)
GFR calc non Af Amer: >60 mL/min (ref >60)
GLUCOSE: 107 mg/dL — AB (ref 65–99)
Potassium: 4 mmol/L (ref 3.5–5.1)
Sodium: 136 mmol/L (ref 135–145)
TOTAL PROTEIN: 8.6 g/dL — AB (ref 6.5–8.1)

## 2017-03-13 LAB — CBC
HCT: 45 % (ref 36.0–46.0)
HEMOGLOBIN: 15 g/dL (ref 12.0–15.0)
MCH: 28.8 pg (ref 26.0–34.0)
MCHC: 33.3 g/dL (ref 30.0–36.0)
MCV: 86.5 fL (ref 78.0–100.0)
PLATELETS: 310 10*3/uL (ref 150–400)
RBC: 5.2 MIL/uL — ABNORMAL HIGH (ref 3.87–5.11)
RDW: 13.7 % (ref 11.5–15.5)
WBC: 9.2 10*3/uL (ref 4.0–10.5)

## 2017-03-13 LAB — LIPASE, BLOOD: Lipase: 23 U/L (ref 11–51)

## 2017-03-13 LAB — I-STAT BETA HCG BLOOD, ED (MC, WL, AP ONLY)

## 2017-03-13 MED ORDER — ONDANSETRON 4 MG PO TBDP
4.0000 mg | ORAL_TABLET | Freq: Once | ORAL | Status: AC | PRN
  Administered 2017-03-13: 4 mg via ORAL
  Filled 2017-03-13: qty 1

## 2017-03-13 NOTE — ED Triage Notes (Signed)
Pt reports having an ablation over a year ago. Had return of her period and now has severe abd cramps and is causing n/v.

## 2017-03-13 NOTE — ED Notes (Signed)
Hall A by pod F doors

## 2017-03-13 NOTE — ED Notes (Signed)
Patient angry with waiting, cursed staff and left

## 2017-03-17 ENCOUNTER — Other Ambulatory Visit (HOSPITAL_BASED_OUTPATIENT_CLINIC_OR_DEPARTMENT_OTHER): Payer: Self-pay

## 2017-03-17 DIAGNOSIS — R0683 Snoring: Secondary | ICD-10-CM

## 2017-03-17 DIAGNOSIS — G4710 Hypersomnia, unspecified: Secondary | ICD-10-CM

## 2017-03-17 DIAGNOSIS — R5383 Other fatigue: Secondary | ICD-10-CM

## 2017-03-29 ENCOUNTER — Ambulatory Visit (HOSPITAL_BASED_OUTPATIENT_CLINIC_OR_DEPARTMENT_OTHER): Payer: Medicaid Other | Attending: Internal Medicine | Admitting: Internal Medicine

## 2017-03-29 VITALS — Ht 62.0 in | Wt 213.0 lb

## 2017-03-29 DIAGNOSIS — E669 Obesity, unspecified: Secondary | ICD-10-CM | POA: Insufficient documentation

## 2017-03-29 DIAGNOSIS — R0683 Snoring: Secondary | ICD-10-CM | POA: Insufficient documentation

## 2017-03-29 DIAGNOSIS — G4719 Other hypersomnia: Secondary | ICD-10-CM | POA: Diagnosis not present

## 2017-03-29 DIAGNOSIS — I10 Essential (primary) hypertension: Secondary | ICD-10-CM | POA: Diagnosis not present

## 2017-03-29 DIAGNOSIS — G4710 Hypersomnia, unspecified: Secondary | ICD-10-CM

## 2017-03-29 DIAGNOSIS — R5383 Other fatigue: Secondary | ICD-10-CM | POA: Insufficient documentation

## 2017-03-29 DIAGNOSIS — G4736 Sleep related hypoventilation in conditions classified elsewhere: Secondary | ICD-10-CM | POA: Insufficient documentation

## 2017-03-29 DIAGNOSIS — I493 Ventricular premature depolarization: Secondary | ICD-10-CM | POA: Diagnosis not present

## 2017-03-29 DIAGNOSIS — Z6839 Body mass index (BMI) 39.0-39.9, adult: Secondary | ICD-10-CM | POA: Insufficient documentation

## 2017-04-07 DIAGNOSIS — R0683 Snoring: Secondary | ICD-10-CM | POA: Diagnosis not present

## 2017-04-07 NOTE — Procedures (Signed)
    Patient Name: Briana Wade, Briana Wade Date: 03/29/2017 Gender: Female D.O.B: May 31, 1970 Age (years): 46 Referring Provider: Doristine Section Bonsu Height (inches): 62 Interpreting Physician: Baird Lyons MD, ABSM Weight (lbs): 213 RPSGT: Jorge Ny BMI: 39 MRN: [id number] Neck Size: 15.00 <br> <br> CLINICAL INFORMATION Sleep Study Type: NPSG  Indication for sleep study: Excessive Daytime Sleepiness, Fatigue, Hypertension, Obesity, Snoring  Epworth Sleepiness Score: 0  SLEEP STUDY TECHNIQUE As per the AASM Manual for the Scoring of Sleep and Associated Events v2.3 (April 2016) with a hypopnea requiring 4% desaturations.  The channels recorded and monitored were frontal, central and occipital EEG, electrooculogram (EOG), submentalis EMG (chin), nasal and oral airflow, thoracic and abdominal wall motion, anterior tibialis EMG, snore microphone, electrocardiogram, and pulse oximetry.  MEDICATIONS Medications self-administered by patient taken the night of the study : none reported  SLEEP ARCHITECTURE The study was initiated at 10:18:10 PM and ended at 5:27:20 AM.  Sleep onset time was 1.4 minutes and the sleep efficiency was 87.7%%. The total sleep time was 376.5 minutes.  Stage REM latency was 10.5 minutes.  The patient spent 7.3%% of the night in stage N1 sleep, 63.9%% in stage N2 sleep, 0.0%% in stage N3 and 28.82% in REM.  Alpha intrusion was absent.  Supine sleep was 41.83%.  RESPIRATORY PARAMETERS The overall apnea/hypopnea index (AHI) was 1.8 per hour. There were 1 total apneas, including 1 obstructive, 0 central and 0 mixed apneas. There were 10 hypopneas and 10 RERAs.  The AHI during Stage REM sleep was 6.1 per hour.  AHI while supine was 3.8 per hour.  The mean oxygen saturation was 97.9%. The minimum SpO2 during sleep was 92.0%.  soft snoring was noted during this study.  CARDIAC DATA The 2 lead EKG demonstrated sinus rhythm. The mean heart rate was  74.4 beats per minute. Other EKG findings include: PVCs . LEG MOVEMENT DATA The total PLMS were 0 with a resulting PLMS index of 0.0. Associated arousal with leg movement index was 2.1 .  IMPRESSIONS - No significant obstructive sleep apnea occurred during this study (AHI = 1.8/h). - No significant central sleep apnea occurred during this study (CAI = 0.0/h). - The patient had minimal or no oxygen desaturation during the study (Min O2 = 92.0%) - The patient snored with soft snoring volume. - EKG findings include PVCs. - Clinically significant periodic limb movements did not occur during sleep. No significant associated arousals. - Unremarkable sleep pattern for sleep center environment.  DIAGNOSIS - Nocturnal Hypoxemia (327.26 [G47.36 ICD-10])  RECOMMENDATIONS - Be careful with alcohol, sedatives and other CNS depressants that may worsen sleep apnea and disrupt normal sleep architecture. - Sleep hygiene should be reviewed to assess factors that may improve sleep quality. - Weight management and regular exercise should be initiated or continued if appropriate.  [Electronically signed] 04/07/2017 01:24 PM  Baird Lyons MD, [place], American Board of Sleep Medicine   NPI: [id number]                         [place], [place] [facility]  ELECTRONICALLY SIGNED ON:  04/07/2017, 1:21 PM [place] PH: [phone]   FX: [phone] [place]

## 2017-04-27 ENCOUNTER — Encounter (HOSPITAL_COMMUNITY): Payer: Self-pay

## 2017-04-27 ENCOUNTER — Emergency Department (HOSPITAL_COMMUNITY)
Admission: EM | Admit: 2017-04-27 | Discharge: 2017-04-27 | Disposition: A | Discharge status: Home / Self Care | Payer: Medicaid Other | Attending: Emergency Medicine | Admitting: Emergency Medicine

## 2017-04-27 ENCOUNTER — Other Ambulatory Visit: Payer: Self-pay

## 2017-04-27 ENCOUNTER — Emergency Department (HOSPITAL_COMMUNITY): Payer: Medicaid Other

## 2017-04-27 DIAGNOSIS — K5792 Diverticulitis of intestine, part unspecified, without perforation or abscess without bleeding: Secondary | ICD-10-CM | POA: Diagnosis not present

## 2017-04-27 DIAGNOSIS — R1032 Left lower quadrant pain: Secondary | ICD-10-CM | POA: Diagnosis present

## 2017-04-27 DIAGNOSIS — I10 Essential (primary) hypertension: Secondary | ICD-10-CM | POA: Diagnosis not present

## 2017-04-27 DIAGNOSIS — J45909 Unspecified asthma, uncomplicated: Secondary | ICD-10-CM | POA: Insufficient documentation

## 2017-04-27 DIAGNOSIS — Z79899 Other long term (current) drug therapy: Secondary | ICD-10-CM | POA: Insufficient documentation

## 2017-04-27 LAB — COMPREHENSIVE METABOLIC PANEL
ALT: 12 U/L — AB (ref 14–54)
AST: 14 U/L — AB (ref 15–41)
Albumin: 3.5 g/dL (ref 3.5–5.0)
Alkaline Phosphatase: 81 U/L (ref 38–126)
Anion gap: 8 (ref 5–15)
BUN: 8 mg/dL (ref 6–20)
CO2: 23 mmol/L (ref 22–32)
Calcium: 9 mg/dL (ref 8.9–10.3)
Chloride: 102 mmol/L (ref 101–111)
Creatinine, Ser: 0.73 mg/dL (ref 0.44–1.00)
GFR calc Af Amer: >60 mL/min (ref >60)
Glucose, Bld: 99 mg/dL (ref 65–99)
POTASSIUM: 3.6 mmol/L (ref 3.5–5.1)
Sodium: 133 mmol/L — ABNORMAL LOW (ref 135–145)
Total Bilirubin: 0.9 mg/dL (ref 0.3–1.2)
Total Protein: 7 g/dL (ref 6.5–8.1)

## 2017-04-27 LAB — CBC
HEMATOCRIT: 37.3 % (ref 36.0–46.0)
Hemoglobin: 12.2 g/dL (ref 12.0–15.0)
MCH: 28.3 pg (ref 26.0–34.0)
MCHC: 32.7 g/dL (ref 30.0–36.0)
MCV: 86.5 fL (ref 78.0–100.0)
Platelets: 281 10*3/uL (ref 150–400)
RBC: 4.31 MIL/uL (ref 3.87–5.11)
RDW: 14.3 % (ref 11.5–15.5)
WBC: 6.6 10*3/uL (ref 4.0–10.5)

## 2017-04-27 LAB — I-STAT BETA HCG BLOOD, ED (MC, WL, AP ONLY): I-stat hCG, quantitative: <5 m[IU]/mL (ref <5)

## 2017-04-27 LAB — LIPASE, BLOOD: LIPASE: 26 U/L (ref 11–51)

## 2017-04-27 MED ORDER — METRONIDAZOLE 500 MG PO TABS: 500.0000 mg | ORAL_TABLET | Freq: Three times a day (TID) | ORAL | 0 refills | Status: DC

## 2017-04-27 MED ORDER — MORPHINE SULFATE (PF) 4 MG/ML IV SOLN
4.0000 mg | Freq: Once | INTRAVENOUS | Status: AC
  Administered 2017-04-27: 4 mg via INTRAMUSCULAR

## 2017-04-27 MED ORDER — ONDANSETRON 4 MG PO TBDP
4.0000 mg | ORAL_TABLET | Freq: Once | ORAL | Status: AC
  Administered 2017-04-27: 4 mg via ORAL
  Filled 2017-04-27: qty 1

## 2017-04-27 MED ORDER — MORPHINE SULFATE (PF) 4 MG/ML IV SOLN
4.0000 mg | Freq: Once | INTRAVENOUS | Status: DC
  Filled 2017-04-27: qty 1

## 2017-04-27 MED ORDER — SODIUM CHLORIDE 0.9 % IV BOLUS: 500.0000 mL | Freq: Once | INTRAVENOUS | Status: DC

## 2017-04-27 MED ORDER — ONDANSETRON 4 MG PO TBDP: 4.0000 mg | ORAL_TABLET | Freq: Three times a day (TID) | ORAL | 0 refills | Status: DC | PRN

## 2017-04-27 MED ORDER — ONDANSETRON HCL 4 MG/2ML IJ SOLN
4.0000 mg | Freq: Once | INTRAMUSCULAR | Status: DC
  Filled 2017-04-27: qty 2

## 2017-04-27 MED ORDER — CIPROFLOXACIN HCL 500 MG PO TABS: 500.0000 mg | ORAL_TABLET | Freq: Two times a day (BID) | ORAL | 0 refills | Status: DC

## 2017-04-27 MED ORDER — OXYCODONE-ACETAMINOPHEN 5-325 MG PO TABS: 1.0000 | ORAL_TABLET | Freq: Four times a day (QID) | ORAL | 0 refills | Status: DC | PRN

## 2017-04-27 MED ORDER — CIPROFLOXACIN HCL 500 MG PO TABS: 500.0000 mg | ORAL_TABLET | Freq: Once | ORAL | Status: DC

## 2017-04-27 MED ORDER — OXYCODONE-ACETAMINOPHEN 5-325 MG PO TABS: 1.0000 | ORAL_TABLET | Freq: Once | ORAL | Status: DC

## 2017-04-27 MED ORDER — METRONIDAZOLE 500 MG PO TABS: 500.0000 mg | ORAL_TABLET | Freq: Once | ORAL | Status: DC

## 2017-04-27 NOTE — ED Provider Notes (Signed)
[facility] EMERGENCY DEPARTMENT Provider Note   CSN: [id number] Arrival date & time: 04/27/17  0947     History   Chief Complaint Chief Complaint  Patient presents with  . Abdominal Pain    HPI Briana Wade is a 47 y.o. female with a hx of diverticulitis, GERD, IBS, chronic constipation, appendectomy, and tubal ligation who presents to the ED complaining of abdominal pain x 2 days. Patient describes the pain as being to the LLQ. States it is a 10/10 in severity, has not tried intervention prior to arrival. No specific alleviating/aggravating factors. She has has associated nausea and constipation- last BM was 2 days prior. States this feels similar to prior diverticulitis. Denies fever, chills, dysuria,blood in stool, diarrhea, or vaginal bleeding/vaginal discharge.   HPI  Past Medical History:  Diagnosis Date  . Abdominal pain, unspecified site   . Allergy   . Anemia   . Asthma   . Chlamydia   . Diverticulitis   . Esophageal reflux   . Hiatal hernia   . Hypertension   . IBS (irritable bowel syndrome)   . Insomnia   . Lactose intolerance   . Migraine   . Other constipation   . Personal history of colonic polyps 09/12/2010   tubular adenoma  . Stroke [facility]) 2014   minor right srm weakness admitted to [facility]  . Trichimoniasis   . Tubular adenoma of colon   . Umbilical hernia   . Uterine fibroid     Patient Active Problem List   Diagnosis Date Noted  . Diverticulosis of sigmoid colon 04/27/2016  . Acute diverticulitis of intestine 04/27/2016  . Diverticula of colon 05/21/2015  . Asthma 05/21/2015  . Acute diverticulitis 05/21/2015  . Diverticulitis of sigmoid colon 06/02/2011  . Nausea and vomiting 06/02/2011  . Otitis 06/02/2011  . HTN (hypertension) 06/02/2011  . GERD (gastroesophageal reflux disease) 10/21/2010  . Gluten intolerance 10/21/2010  . Personal history of colonic polyps 10/21/2010  . Flatulence, eructation, and gas pain  09/12/2010  . 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 Irritable bowel syndrome 09/12/2010  . Abdominal pain 08/26/2010  . Bloating 08/26/2010  . 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 IBS (irritable bowel syndrome) 08/26/2010  . CONSTIPATION, CHRONIC 05/11/2007  . ABDOMINAL PAIN, CHRONIC 05/11/2007    Past Surgical History:  Procedure Laterality Date  . APPENDECTOMY    . APPENDECTOMY    . COLONOSCOPY    . DILATION AND CURETTAGE OF UTERUS     x2 for EAB  . HYSTEROSCOPY WITH NOVASURE N/A 12/17/2015   Procedure: HYSTEROSCOPY WITH NOVASURE;  Surgeon: Aletha Halim, MD;  Location: [facility];  Service: Gynecology;  Laterality: N/A;  . TUBAL LIGATION       OB History    Gravida  7   Para  3   Term  3   Preterm      AB  4   Living  3     SAB  1   TAB  3   Ectopic      Multiple      Live Births           Obstetric Comments  svd x 3         Home Medications    Prior to Admission medications   Medication Sig Start Date End Date Taking? Authorizing Provider  albuterol (PROVENTIL HFA;VENTOLIN HFA) 108 (90 Base) MCG/ACT inhaler Inhale 1-2 puffs into the lungs every 6 (six) hours  as needed for wheezing or shortness of breath. 10/02/16   Varney Biles, MD  benzonatate (TESSALON) 100 MG capsule Take 1 capsule (100 mg total) by mouth every 8 (eight) hours. Patient not taking: Reported on 10/23/2016 10/02/16   Varney Biles, MD  HYDROcodone-acetaminophen (NORCO/VICODIN) 5-325 MG tablet Take 1-2 tablets by mouth every 4 (four) hours as needed for moderate pain or severe pain. Patient not taking: Reported on 01/23/2017 10/23/16   Charlesetta Shanks, MD  HYDROcodone-homatropine [facility]) 5-1.5 MG/5ML syrup Take 5 mLs by mouth every 6 (six) hours as needed for cough. Patient not taking: Reported on 01/23/2017 10/08/16   Rodell Perna A, PA-C  ibuprofen (ADVIL,MOTRIN) 200 MG tablet Take 200 mg by mouth every 6 (six) hours as  needed for moderate pain.    [provider]  losartan (COZAAR) 50 MG tablet Take 50 mg by mouth daily.    [provider]  ondansetron (ZOFRAN ODT) 4 MG disintegrating tablet 4mg  ODT q4 hours prn nausea/vomit Patient not taking: Reported on 01/23/2017 10/23/16   Charlesetta Shanks, MD  ondansetron (ZOFRAN ODT) 4 MG disintegrating tablet Take 1 tablet (4 mg total) by mouth every 8 (eight) hours as needed for nausea or vomiting. 01/23/17   Mackuen, Fredia Sorrow, MD    Family History Family History  Problem Relation Age of Onset  . Hypertension Mother   . Cancer Mother        not sure of type  . Colon cancer Neg Hx   . Esophageal cancer Neg Hx   . Rectal cancer Neg Hx   . Stomach cancer Neg Hx     Social History Social History   Tobacco Use  . Smoking status: Never Smoker  . Smokeless tobacco: Never Used  Substance Use Topics  . Alcohol use: No  . Drug use: No     Allergies   Patient has no known allergies.   Review of Systems Review of Systems  Constitutional: Negative for chills and fever.  Respiratory: Negative for shortness of breath.   Cardiovascular: Negative for chest pain.  Gastrointestinal: Positive for abdominal pain, constipation and nausea. Negative for blood in stool, diarrhea and vomiting.  Genitourinary: Negative for dysuria, hematuria, vaginal bleeding and vaginal discharge.  All other systems reviewed and are negative.    Physical Exam Updated Vital Signs BP (!) 158/100 (BP Location: Right Arm)   Pulse 84   Temp 98 F (36.7 C) (Oral)   Resp 16   Ht 5\' 1"  (1.549 m)   Wt 96.2 kg (212 lb)   SpO2 99%   BMI 40.06 kg/m   Physical Exam  Constitutional: She appears well-developed and well-nourished. No distress.  HENT:  Head: Normocephalic and atraumatic.  Eyes: Conjunctivae are normal. Right eye exhibits no discharge. Left eye exhibits no discharge.  Cardiovascular: Normal rate and regular rhythm.  No murmur  heard. Pulmonary/Chest: Breath sounds normal. No respiratory distress. She has no wheezes. She has no rales.  Abdominal: Soft. Bowel sounds are normal. She exhibits no distension. There is tenderness in the left lower quadrant. There is no rigidity, no rebound and no CVA tenderness.  Neurological: She is alert.  Clear speech.   Skin: Skin is warm and dry. No rash noted.  Psychiatric: She has a normal mood and affect. Her behavior is normal.  Nursing note and vitals reviewed.   ED Treatments / Results  Labs Results for orders placed or performed during the hospital encounter of 04/27/17  Lipase, blood  Result Value Ref  Range   Lipase 26 11 - 51 U/L  Comprehensive metabolic panel  Result Value Ref Range   Sodium 133 (L) 135 - 145 mmol/L   Potassium 3.6 3.5 - 5.1 mmol/L   Chloride 102 101 - 111 mmol/L   CO2 23 22 - 32 mmol/L   Glucose, Bld 99 65 - 99 mg/dL   BUN 8 6 - 20 mg/dL   Creatinine, Ser 0.73 0.44 - 1.00 mg/dL   Calcium 9.0 8.9 - 10.3 mg/dL   Total Protein 7.0 6.5 - 8.1 g/dL   Albumin 3.5 3.5 - 5.0 g/dL   AST 14 (L) 15 - 41 U/L   ALT 12 (L) 14 - 54 U/L   Alkaline Phosphatase 81 38 - 126 U/L   Total Bilirubin 0.9 0.3 - 1.2 mg/dL   GFR calc non Af Amer >60 >60 mL/min   GFR calc Af Amer >60 >60 mL/min   Anion gap 8 5 - 15  CBC  Result Value Ref Range   WBC 6.6 4.0 - 10.5 K/uL   RBC 4.31 3.87 - 5.11 MIL/uL   Hemoglobin 12.2 12.0 - 15.0 g/dL   HCT 37.3 36.0 - 46.0 %   MCV 86.5 78.0 - 100.0 fL   MCH 28.3 26.0 - 34.0 pg   MCHC 32.7 30.0 - 36.0 g/dL   RDW 14.3 11.5 - 15.5 %   Platelets 281 150 - 400 K/uL  I-Stat beta hCG blood, ED  Result Value Ref Range   I-stat hCG, quantitative <5.0 <5 mIU/mL   Comment 3          EKG None  Radiology Ct Abdomen Pelvis Wo Contrast  Result Date: 04/27/2017 CLINICAL DATA:  Abdominal pain. EXAM: CT ABDOMEN AND PELVIS WITHOUT CONTRAST TECHNIQUE: Multidetector CT imaging of the abdomen and pelvis was performed following the standard  protocol without IV contrast. COMPARISON:  01/23/2017 FINDINGS: Lower chest: No acute abnormality. Hepatobiliary: No focal liver abnormality is seen. No gallstones, gallbladder wall thickening, or biliary dilatation. Pancreas: Unremarkable. No pancreatic ductal dilatation or surrounding inflammatory changes. Spleen: Normal in size without focal abnormality. Adrenals/Urinary Tract: The adrenal glands appear normal. Normal appearance of the left kidney. Right kidney is unremarkable. No kidney stones or hydronephrosis identified.The urinary bladder appears normal. Stomach/Bowel: Small hiatal hernia. The small bowel loops have a normal course and caliber without evidence for a bowel obstruction. The appendix is not identified compatible with the history of prior appendectomy. Numerous colonic diverticula noted. Wall thickening and inflammation surrounding the sigmoid colon is identified, favored to represent sequelae of acute diverticulitis. No significant free air. No abnormal bowel dilatation identified. Vascular/Lymphatic: Normal appearance of the abdominal aorta. No aneurysm. No adenopathy identified within the upper abdomen. No pelvic or inguinal adenopathy. Reproductive: Fluid identified within the endometrial cavity. Other: There is a small amount of free fluid surrounding the sigmoid colon and extending into the dependent portions of the pelvis. No discrete fluid collection identified. There is an umbilical hernia which contains fat only. Musculoskeletal: No suspicious bone lesions. IMPRESSION: 1. Imaging findings suspicious for acute sigmoid diverticulitis. No evidence for free perforation or abscess formation. 2. Small hiatal hernia. Electronically Signed   By: Kerby Moors M.D.   On: 04/27/2017 16:17    Procedures Procedures (including critical care time)  Medications Ordered in ED Medications  ondansetron (ZOFRAN-ODT) disintegrating tablet 4 mg (4 mg Oral Given 04/27/17 1537)  morphine 4 MG/ML  injection 4 mg (4 mg Intramuscular Given 04/27/17 1534)  Initial Impression / Assessment and Plan / ED Course  I have reviewed the triage vital signs and the nursing notes.  Pertinent labs & imaging results that were available during my care of the patient were reviewed by me and considered in my medical decision making (see chart for details).   Patient presents with abdominal pain. Patient is nontoxic appearing, noted to be hypertensive- do not suspect HTN emergency, discussed need for recheck with the patient, vitals are otherwise WNL. On exam patient has LLQ tenderness to palpation. No peritoneal signs. Lab work reviewed and grossly unremarkable. No leukocytosis or anemia. Renal fxn WNL. LFTs without significant abnormality. Given patient has required admission for diverticulitis before and this feels similar with exquisite tenderness will obtain CT abdomen pelvis w contrast. Will give morphine and zofran.   15:00: RNs and IV team unable to establish IV. Will change orders for anti-emetics and analgesic to PO and switch to non contrast CT.   CT revealed findings suspicious for acute sigmoid diverticulitis no evidence for perforation of abscess formation.   17:00: RE-EVAL: Patient feeling improved after medications, states she would like to go home. She is able to tolerate PO in the ED. Will administer first does of abx in the ED and DC home with abx, pain control, and anti-emetics.   I discussed results, treatment plan, need for PCP follow-up, and return precautions with the patient. Provided opportunity for questions, patient confirmed understanding and is in agreement with plan.   Findings and plan of care discussed with supervising physician Dr. Roderic Palau who is in agreement.   Vitals:   04/27/17 1600 04/27/17 1700  BP: 140/86 140/74  Pulse:  71  Resp:  14  Temp:    SpO2:  99%   Final Clinical Impressions(s) / ED Diagnoses   Final diagnoses:  Diverticulitis    ED Discharge  Orders        Ordered    ciprofloxacin (CIPRO) 500 MG tablet  2 times daily     04/27/17 1705    metroNIDAZOLE (FLAGYL) 500 MG tablet  3 times daily     04/27/17 1705    oxyCODONE-acetaminophen (PERCOCET/ROXICET) 5-325 MG tablet  Every 6 hours PRN     04/27/17 1705    ondansetron (ZOFRAN ODT) 4 MG disintegrating tablet  Every 8 hours PRN     04/27/17 [address], [place], PA-C 04/27/17 Ysidro Evert, MD 04/28/17 1008

## 2017-04-27 NOTE — ED Triage Notes (Signed)
Pt here reporting abdominal pain that started 2 days ago. Pt reports hx of diverticulitis. She reports constipation, LBM 2 days ago. Pt reports nausea but denies vomiting.

## 2017-04-27 NOTE — Discharge Instructions (Signed)
You were seen in the emergency department today and diagnosed with diverticulitis.  Please see the attached handout regarding further information for this condition.  We have prescribed you multiple medications: -Metronidazole, ciprofloxacin-these are antibiotics used to treat the infection.  You need to take metronidazole 3 times per day and ciprofloxacin 2 times per day.  Do not drink or consume any type of alcohol when taking these medications as you can have severe side effects from taking them with alcohol. Please take all of your antibiotics until finished. You may develop abdominal discomfort or diarrhea from the antibiotic.  You may help offset this with probiotics which you can buy at the store (ask your pharmacist if unable to find) or get probiotics in the form of eating yogurt. Do not eat or take the probiotics until 2 hours after your antibiotic. If you are unable to tolerate these side effects follow-up with your primary care provider or return to the emergency department.   If you begin to experience any blistering, rashes, swelling, or difficulty breathing seek medical care for evaluation of potentially more serious side effects.   Please be aware that this medication may interact with other medications you are taking, please be sure to discuss your medication list with your pharmacist. If you are taking birth control the antibiotic will deactivate your birth control for 2 weeks. If on coumadin the antibiotic will effect your coumadin level.   -Zofran-take this every 8 hours as needed for nausea. -Percocet-take this every 6 hours as needed for severe pain.  Try taking Motrin prior to taking Percocet.  Do not drive or operate heavy machinery when taking Percocet.  Do not mix Percocet with alcohol.  Keep Percocet out of the reach of small children.  We have prescribed you new medication(s) today. Discuss the medications prescribed today with your pharmacist as they can have adverse effects and  interactions with your other medicines including over the counter and prescribed medications. Seek medical evaluation if you start to experience new or abnormal symptoms after taking one of these medicines, seek care immediately if you start to experience difficulty breathing, feeling of your throat closing, facial swelling, or rash as these could be indications of a more serious allergic reaction    You will need to follow-up with your primary care doctor in the next 3-5 days for reevaluation.  Return to the emergency department for new or worsening symptoms including but not limited to worsening pain, inability to keep down fluids or medications, blood in your stool, or any other concerns that you may have.

## 2017-04-27 NOTE — ED Notes (Signed)
Patient back to ED after going to eat in cafeteria

## 2017-06-07 ENCOUNTER — Ambulatory Visit: Payer: Medicaid Other | Admitting: Obstetrics and Gynecology

## 2017-06-07 ENCOUNTER — Other Ambulatory Visit (HOSPITAL_COMMUNITY)
Admission: RE | Admit: 2017-06-07 | Discharge: 2017-06-07 | Disposition: A | Discharge status: Home / Self Care | Payer: Medicaid Other | Source: Ambulatory Visit | Attending: Obstetrics and Gynecology | Admitting: Obstetrics and Gynecology

## 2017-06-07 ENCOUNTER — Encounter: Payer: Self-pay | Admitting: Obstetrics and Gynecology

## 2017-06-07 VITALS — BP 137/96 | HR 98 | Wt 214.9 lb

## 2017-06-07 DIAGNOSIS — N939 Abnormal uterine and vaginal bleeding, unspecified: Secondary | ICD-10-CM | POA: Insufficient documentation

## 2017-06-07 NOTE — Progress Notes (Signed)
Obstetrics and Gynecology Established Patient Evaluation  Appointment Date: 06/07/2017  OBGYN Clinic: [facility]  Primary Care Provider: [facility] Mccre[facility])  Referring Provider: self  Chief Complaint:  Chief Complaint  Patient presents with  . Menstrual Problem    History of Present Illness: Briana Wade is a 47 y.o. African-American [id number] (No LMP recorded. Patient has had an ablation.), seen for the above chief complaint. Her past medical history is significant for 11/2015 novasure ablation for AUB, h/o BTL, HTN, migraines, BMI 41, HTN, diverticulitis, umbilical hernia    Patient states she had a few months of amenorrhea after her ablation but has had AUB ever since. She feels that sometimes she has a period but had bleeding almost every day; it's usually light and not painful. Prior to the ablation, she had a negative embx and pap smear and had been on PO medical therapy prior to this. She denies any climateric s/s.   Review of Systems: as noted in the History of Present Illness.  Patient Active Problem List   Diagnosis Date Noted  . Diverticulosis of sigmoid colon 04/27/2016  . Acute diverticulitis of intestine 04/27/2016  . Diverticula of colon 05/21/2015  . Asthma 05/21/2015  . Acute diverticulitis 05/21/2015  . Diverticulitis of sigmoid colon 06/02/2011  . Nausea and vomiting 06/02/2011  . Otitis 06/02/2011  . HTN (hypertension) 06/02/2011  . GERD (gastroesophageal reflux disease) 10/21/2010  . Gluten intolerance 10/21/2010  . Personal history of colonic polyps 10/21/2010  . Flatulence, eructation, and gas pain 09/12/2010  . 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 Irritable bowel syndrome 09/12/2010  . Abdominal pain 08/26/2010  . Bloating 08/26/2010  . 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 IBS (irritable bowel syndrome) 08/26/2010  . CONSTIPATION, CHRONIC  05/11/2007  . ABDOMINAL PAIN, CHRONIC 05/11/2007    Past Medical History:  Past Medical History:  Diagnosis Date  . Abdominal pain, unspecified site   . Allergy   . Anemia   . Asthma   . Chlamydia   . Diverticulitis   . Esophageal reflux   . Hiatal hernia   . Hypertension   . IBS (irritable bowel syndrome)   . Insomnia   . Lactose intolerance   . Migraine equivalent 08/2014   minor right arm weakness in [facility]. neg CVA on imaging.   . Other constipation   . Personal history of colonic polyps 09/12/2010   tubular adenoma  . Trichimoniasis   . Tubular adenoma of colon   . Umbilical hernia   . Uterine fibroid     Past Surgical History:  Past Surgical History:  Procedure Laterality Date  . APPENDECTOMY    . COLONOSCOPY    . DILATION AND CURETTAGE OF UTERUS     x2 for EAB  . HYSTEROSCOPY WITH NOVASURE N/A 12/17/2015   Procedure: HYSTEROSCOPY WITH NOVASURE;  Surgeon: Aletha Halim, MD;  Location: [facility];  Service: Gynecology;  Laterality: N/A;  . TUBAL LIGATION      Past Obstetrical History:  OB History  Gravida Para Term Preterm AB Living  7 3 3   4 3   SAB TAB Ectopic Multiple Live Births  1 3          # Outcome Date GA Lbr Len/2nd Weight Sex Delivery Anes PTL Lv  7 TAB           6 TAB           5 TAB  4 SAB           3 Term           2 Term           1 Term             Obstetric Comments  svd x 3    Past Gynecological History: As per HPI.  Social History:  Social History   Socioeconomic History  . Marital status: Single    Spouse name: Not on file  . Number of children: 3  . Years of education: Not on file  . Highest education level: Not on file  Occupational History  . Occupation: Unemployed  Social Needs  . Financial resource strain: Not on file  . Food insecurity:    Worry: Not on file    Inability: Not on file  . Transportation needs:    Medical: Not on file    Non-medical: Not on file  Tobacco Use  . Smoking status: Never  Smoker  . Smokeless tobacco: Never Used  Substance and Sexual Activity  . Alcohol use: No  . Drug use: No  . Sexual activity: Yes    Birth control/protection: Surgical  Lifestyle  . Physical activity:    Days per week: Not on file    Minutes per session: Not on file  . Stress: Not on file  Relationships  . Social connections:    Talks on phone: Not on file    Gets together: Not on file    Attends religious service: Not on file    Active member of club or organization: Not on file    Attends meetings of clubs or organizations: Not on file    Relationship status: Not on file  . Intimate partner violence:    Fear of current or ex partner: Not on file    Emotionally abused: Not on file    Physically abused: Not on file    Forced sexual activity: Not on file  Other Topics Concern  . Not on file  Social History Narrative  . Not on file    Family History:  Family History  Problem Relation Age of Onset  . Hypertension Mother   . Cancer Mother        not sure of type  . Colon cancer Neg Hx   . Esophageal cancer Neg Hx   . Rectal cancer Neg Hx   . Stomach cancer Neg Hx    She denies any female cancers, bleeding or blood clotting disorders.    Medications Caryl Ada. Kings had no medications administered during this visit. Current Outpatient Medications  Medication Sig Dispense Refill  . losartan (COZAAR) 50 MG tablet Take 50 mg by mouth daily.  1  . ibuprofen (ADVIL,MOTRIN) 200 MG tablet Take 200 mg by mouth every 6 (six) hours as needed for moderate pain.     No current facility-administered medications for this visit.     Allergies Lactose intolerance (gi)   Physical Exam:  BP (!) 137/96 (BP Location: Right Arm, Patient Position: Sitting, Cuff Size: Large)   Pulse 98   Wt 214 lb 14.4 oz (97.5 kg)   BMI 40.60 kg/m  Body mass index is 40.6 kg/m. General appearance: Well nourished, well developed female in no acute distress.   Cardiovascular: normal s1 and s2.   No murmurs, rubs or gallops. Respiratory:  Clear to auscultation bilateral. Normal respiratory effort Abdomen: positive bowel sounds and no masses, hernias; diffusely non tender to  palpation, non distended. Neuro/Psych:  Normal mood and affect.  Skin:  Warm and dry.  Lymphatic:  No inguinal lymphadenopathy.   Pelvic exam: is not limited by body habitus EGBUS: within normal limits, Vagina: within normal limits and with scant old blood. no discharge in the vault, Cervix: normal appearing cervix without tenderness, discharge or lesions. Uterus:  nonenlarged and non tender, mild to moderate decensus and Adnexa:  normal adnexa and no mass, fullness, tenderness Rectovaginal: deferred  Laboratory: none  Radiology: FINDINGS: Uterus  Measurements: 10.7 x 7.2 x 7.6 cm. Multiple uterine fibroids, including:  --3.6 x 2.6 x 3.8 cm subserosal fibroid in the posterior uterine body  --2.3 x 1.9 x 1.6 cm intramural fibroid in the anterior uterine body  --2.3 x 1.6 x 2.1 cm intramural fibroid in the anterior uterine fundus  --1.6 x 1.7 x 1.7 cm submucosal fibroid along the posterior uterine body with associated mucosal component  Endometrium  Thickness: 14 mm. Submucosal fibroid with mucosal component, as noted above.  Right ovary  Measurements: 2.2 x 1.4 x 1.8 cm. Normal appearance/no adnexal mass.  Left ovary  Measurements: 3.5 x 2.4 x 2.6 cm. Normal appearance/no adnexal mass.  Other findings  No abnormal free fluid.  IMPRESSION: Multiple uterine fibroids, including a dominant 3.8 cm subserosal fibroid in the posterior uterine body.  Note is made of a 1.7 cm submucosal fibroid along the posterior uterine body with associated mucosal component.   Electronically Signed   By: Julian Hy M.D.   On: 10/14/2015 10:08  Assessment: pt stable  Plan:  1. Abnormal uterine bleeding (AUB) Will get basic blood work today. D/w her that the bleeding doesn't  sound severe but it is chronic and persistent. Given failure with medications, ablation and no climateric s/s, I told her that hysterectomy is the only option. I told her that it is a major surgery and am confident can do it vaginally but risk of option procedure and post op healing d/w her. Pt to consider options and will let us know. She would need medical clearance with her pcp before proceeding.  - CBC - Beta hCG quant (ref lab) - TSH - Cervicovaginal ancillary only  RTC PRN  Durene Romans MD Attending [facility]) Few months amenorrhea

## 2017-06-08 LAB — CBC
Hematocrit: 37.6 % (ref 34.0–46.6)
Hemoglobin: 12 g/dL (ref 11.1–15.9)
MCH: 27.1 pg (ref 26.6–33.0)
MCHC: 31.9 g/dL (ref 31.5–35.7)
MCV: 85 fL (ref 79–97)
Platelets: 294 10*3/uL (ref 150–379)
RBC: 4.42 x10E6/uL (ref 3.77–5.28)
RDW: 13.4 % (ref 12.3–15.4)
WBC: 5.4 10*3/uL (ref 3.4–10.8)

## 2017-06-08 LAB — CERVICOVAGINAL ANCILLARY ONLY
CHLAMYDIA, DNA PROBE: NEGATIVE
Neisseria Gonorrhea: NEGATIVE
Trichomonas: NEGATIVE

## 2017-06-08 LAB — TSH: TSH: 1.41 u[IU]/mL (ref 0.450–4.500)

## 2017-06-08 LAB — BETA HCG QUANT (REF LAB)

## 2017-07-18 ENCOUNTER — Emergency Department (HOSPITAL_COMMUNITY): Payer: Medicaid Other

## 2017-07-18 ENCOUNTER — Emergency Department (HOSPITAL_COMMUNITY)
Admission: EM | Admit: 2017-07-18 | Discharge: 2017-07-18 | Disposition: A | Discharge status: Home / Self Care | Payer: Medicaid Other | Attending: Emergency Medicine | Admitting: Emergency Medicine

## 2017-07-18 ENCOUNTER — Encounter (HOSPITAL_COMMUNITY): Payer: Self-pay | Admitting: Emergency Medicine

## 2017-07-18 DIAGNOSIS — N939 Abnormal uterine and vaginal bleeding, unspecified: Secondary | ICD-10-CM | POA: Diagnosis not present

## 2017-07-18 DIAGNOSIS — D259 Leiomyoma of uterus, unspecified: Secondary | ICD-10-CM | POA: Insufficient documentation

## 2017-07-18 DIAGNOSIS — R112 Nausea with vomiting, unspecified: Secondary | ICD-10-CM | POA: Insufficient documentation

## 2017-07-18 DIAGNOSIS — J45909 Unspecified asthma, uncomplicated: Secondary | ICD-10-CM | POA: Insufficient documentation

## 2017-07-18 DIAGNOSIS — R1030 Lower abdominal pain, unspecified: Secondary | ICD-10-CM

## 2017-07-18 DIAGNOSIS — I10 Essential (primary) hypertension: Secondary | ICD-10-CM | POA: Diagnosis not present

## 2017-07-18 DIAGNOSIS — N760 Acute vaginitis: Secondary | ICD-10-CM | POA: Insufficient documentation

## 2017-07-18 DIAGNOSIS — B9689 Other specified bacterial agents as the cause of diseases classified elsewhere: Secondary | ICD-10-CM

## 2017-07-18 DIAGNOSIS — K5732 Diverticulitis of large intestine without perforation or abscess without bleeding: Secondary | ICD-10-CM | POA: Diagnosis not present

## 2017-07-18 DIAGNOSIS — K5792 Diverticulitis of intestine, part unspecified, without perforation or abscess without bleeding: Secondary | ICD-10-CM

## 2017-07-18 DIAGNOSIS — Z79899 Other long term (current) drug therapy: Secondary | ICD-10-CM | POA: Insufficient documentation

## 2017-07-18 LAB — CBC WITH DIFFERENTIAL/PLATELET
Basophils Absolute: 0 10*3/uL (ref 0.0–0.1)
Basophils Relative: 0 %
Eosinophils Absolute: 0 10*3/uL (ref 0.0–0.7)
Eosinophils Relative: 0 %
HEMATOCRIT: 38.1 % (ref 36.0–46.0)
Hemoglobin: 12.3 g/dL (ref 12.0–15.0)
LYMPHS ABS: 0.7 10*3/uL (ref 0.7–4.0)
Lymphocytes Relative: 8 %
MCH: 28.1 pg (ref 26.0–34.0)
MCHC: 32.3 g/dL (ref 30.0–36.0)
MCV: 87 fL (ref 78.0–100.0)
MONOS PCT: 4 %
Monocytes Absolute: 0.4 10*3/uL (ref 0.1–1.0)
NEUTROS ABS: 7.6 10*3/uL (ref 1.7–7.7)
NEUTROS PCT: 88 %
Platelets: 298 10*3/uL (ref 150–400)
RBC: 4.38 MIL/uL (ref 3.87–5.11)
RDW: 14.1 % (ref 11.5–15.5)
WBC: 8.8 10*3/uL (ref 4.0–10.5)

## 2017-07-18 LAB — COMPREHENSIVE METABOLIC PANEL
ALBUMIN: 3.8 g/dL (ref 3.5–5.0)
ALK PHOS: 78 U/L (ref 38–126)
ALT: 16 U/L (ref 14–54)
ANION GAP: 7 (ref 5–15)
AST: 29 U/L (ref 15–41)
BUN: 9 mg/dL (ref 6–20)
CALCIUM: 8.8 mg/dL — AB (ref 8.9–10.3)
CHLORIDE: 104 mmol/L (ref 101–111)
CO2: 24 mmol/L (ref 22–32)
Creatinine, Ser: 0.79 mg/dL (ref 0.44–1.00)
GFR calc Af Amer: >60 mL/min (ref >60)
GFR calc non Af Amer: >60 mL/min (ref >60)
GLUCOSE: 102 mg/dL — AB (ref 65–99)
Potassium: 4.9 mmol/L (ref 3.5–5.1)
SODIUM: 135 mmol/L (ref 135–145)
Total Bilirubin: 1.1 mg/dL (ref 0.3–1.2)
Total Protein: 7.4 g/dL (ref 6.5–8.1)

## 2017-07-18 LAB — URINALYSIS, ROUTINE W REFLEX MICROSCOPIC
Bilirubin Urine: NEGATIVE
GLUCOSE, UA: NEGATIVE mg/dL
KETONES UR: NEGATIVE mg/dL
Nitrite: NEGATIVE
PH: 7.5 (ref 5.0–8.0)
Protein, ur: 100 mg/dL — AB
SPECIFIC GRAVITY, URINE: 1.025 (ref 1.005–1.030)

## 2017-07-18 LAB — PREGNANCY, URINE: PREG TEST UR: NEGATIVE

## 2017-07-18 LAB — LIPASE, BLOOD: LIPASE: 25 U/L (ref 11–51)

## 2017-07-18 LAB — WET PREP, GENITAL
Sperm: NONE SEEN
TRICH WET PREP: NONE SEEN
YEAST WET PREP: NONE SEEN

## 2017-07-18 LAB — I-STAT BETA HCG BLOOD, ED (MC, WL, AP ONLY): I-stat hCG, quantitative: 9 m[IU]/mL — ABNORMAL HIGH (ref <5)

## 2017-07-18 LAB — URINALYSIS, MICROSCOPIC (REFLEX)

## 2017-07-18 MED ORDER — METRONIDAZOLE 500 MG PO TABS: 500.0000 mg | ORAL_TABLET | Freq: Two times a day (BID) | ORAL | 0 refills | Status: DC

## 2017-07-18 MED ORDER — IOPAMIDOL (ISOVUE-300) INJECTION 61%
INTRAVENOUS | Status: AC
  Filled 2017-07-18: qty 100

## 2017-07-18 MED ORDER — ONDANSETRON HCL 4 MG PO TABS: 4.0000 mg | ORAL_TABLET | Freq: Three times a day (TID) | ORAL | 0 refills | Status: DC | PRN

## 2017-07-18 MED ORDER — MORPHINE SULFATE (PF) 4 MG/ML IV SOLN
4.0000 mg | Freq: Once | INTRAVENOUS | Status: AC
  Administered 2017-07-18: 4 mg via INTRAMUSCULAR
  Filled 2017-07-18: qty 1

## 2017-07-18 MED ORDER — HYDROMORPHONE HCL 1 MG/ML IJ SOLN
1.0000 mg | Freq: Once | INTRAMUSCULAR | Status: AC
  Administered 2017-07-18: 1 mg via INTRAVENOUS
  Filled 2017-07-18: qty 1

## 2017-07-18 MED ORDER — IOPAMIDOL (ISOVUE-300) INJECTION 61%
100.0000 mL | Freq: Once | INTRAVENOUS | Status: AC | PRN
  Administered 2017-07-18: 100 mL via INTRAVENOUS

## 2017-07-18 MED ORDER — CIPROFLOXACIN HCL 500 MG PO TABS: 500.0000 mg | ORAL_TABLET | Freq: Two times a day (BID) | ORAL | 0 refills | Status: DC

## 2017-07-18 MED ORDER — ONDANSETRON 4 MG PO TBDP
4.0000 mg | ORAL_TABLET | Freq: Once | ORAL | Status: AC
  Administered 2017-07-18: 4 mg via ORAL
  Filled 2017-07-18: qty 1

## 2017-07-18 NOTE — Discharge Instructions (Addendum)
CT shows some mild diverticulitis, please take both antibiotics as directed make sure you complete entire course even if abdominal pain improves.  You may use Zofran as needed for nausea.  Ibuprofen and Tylenol as needed for pain.  Your pelvic ultrasound overall looks good it does show some uterine fibroids no other acute findings, please follow-up with your OB/GYN for further work-up of vaginal bleeding.  Return if you start having fevers, worsening abdominal pain, vomiting and or unable to keep down your antibiotics, or heavier vaginal bleeding.

## 2017-07-18 NOTE — ED Notes (Signed)
Spoke with Briana Haw RN with IV Team, she is aware that we need an IV for blood and [place]. At present pt is on her list and she will be here as quick as she can, she still has several people she is trying to take care of. If we get an IV by chance, please notify her.

## 2017-07-18 NOTE — ED Provider Notes (Signed)
Care assumed from [facility] says shift change, please see his note for full details, but in brief  Briana Wade is a 47 y.o. female who presents with 2 days diffuse lower abdominal pain w/ NV, hx of chronic diverticulitis feels similar.  Also having some vaginal bleeding, post-menopausal w/ remote hx of ablation. Labs reassuring, CT shows mild diverticulosis likely chronic in nature, no other acute findings.  Plan: If [place] normal tx for diverticulitis with cipro/flagyl, CT shows chronic diverticulitis w/o acute abscess or perforation. Vaginal bleeding not brisk on exam, normal Hgb, f/u with OB/GYN if no acute pathology on US  Pelvic ultrasound shows uterine fibroids no other acute findings.  Will treat with antibiotics, prescription for Zofran as well.  Patient to follow-up with her primary care doctor as well as her gynecologist.  Return precautions discussed.  Patient expresses understanding and is in agreement with plan.  US Transvaginal Non-ob  Result Date: 07/18/2017 CLINICAL DATA:  Pelvic pain and vaginal bleeding for the past 2 days. EXAM: TRANSABDOMINAL AND TRANSVAGINAL ULTRASOUND OF PELVIS DOPPLER ULTRASOUND OF OVARIES TECHNIQUE: Both transabdominal and transvaginal ultrasound examinations of the pelvis were performed. Transabdominal technique was performed for global imaging of the pelvis including uterus, ovaries, adnexal regions, and pelvic cul-de-sac. It was necessary to proceed with endovaginal exam following the transabdominal exam to visualize the ovaries. Color and duplex Doppler ultrasound was utilized to evaluate blood flow to the ovaries. COMPARISON:  CT abdomen pelvis from same day. Pelvic ultrasound dated October 14, 2015. FINDINGS: Uterus Measurements: 9.7 x 6.0 x 6.5 cm. Heterogeneous in appearance with at least two fibroids noted. 3.6 x 3.1 x 3.5 cm subserosal fibroid in the posterior uterine body. 2.6 x 1.9 x 3.0 cm intramural fibroid in the anterior uterine body. No  mucosal component. Endometrium Thickness: 11 mm.  No focal abnormality visualized. Right ovary Measurements: 2.5 x 2.1 x 2.3 cm. Normal appearance/no adnexal mass. Left ovary Measurements: 3.1 x 2.3 x 2.3 cm. Normal appearance/no adnexal mass. Pulsed Doppler evaluation of both ovaries demonstrates normal low-resistance arterial and venous waveforms. Other findings Trace free fluid. IMPRESSION: 1. No acute abnormality. 2. Fibroid uterus as described above.  No mucosal component. Electronically Signed   By: Titus Dubin M.D.   On: 07/18/2017 16:55   US Pelvis Complete  Result Date: 07/18/2017 CLINICAL DATA:  Pelvic pain and vaginal bleeding for the past 2 days. EXAM: TRANSABDOMINAL AND TRANSVAGINAL ULTRASOUND OF PELVIS DOPPLER ULTRASOUND OF OVARIES TECHNIQUE: Both transabdominal and transvaginal ultrasound examinations of the pelvis were performed. Transabdominal technique was performed for global imaging of the pelvis including uterus, ovaries, adnexal regions, and pelvic cul-de-sac. It was necessary to proceed with endovaginal exam following the transabdominal exam to visualize the ovaries. Color and duplex Doppler ultrasound was utilized to evaluate blood flow to the ovaries. COMPARISON:  CT abdomen pelvis from same day. Pelvic ultrasound dated October 14, 2015. FINDINGS: Uterus Measurements: 9.7 x 6.0 x 6.5 cm. Heterogeneous in appearance with at least two fibroids noted. 3.6 x 3.1 x 3.5 cm subserosal fibroid in the posterior uterine body. 2.6 x 1.9 x 3.0 cm intramural fibroid in the anterior uterine body. No mucosal component. Endometrium Thickness: 11 mm.  No focal abnormality visualized. Right ovary Measurements: 2.5 x 2.1 x 2.3 cm. Normal appearance/no adnexal mass. Left ovary Measurements: 3.1 x 2.3 x 2.3 cm. Normal appearance/no adnexal mass. Pulsed Doppler evaluation of both ovaries demonstrates normal low-resistance arterial and venous waveforms. Other findings Trace free fluid. IMPRESSION: 1.  No  acute abnormality. 2. Fibroid uterus as described above.  No mucosal component. Electronically Signed   By: Titus Dubin M.D.   On: 07/18/2017 16:55   Ct Abdomen Pelvis W Contrast  Result Date: 07/18/2017 CLINICAL DATA:  Abdominal distension, nausea and vomiting. EXAM: CT ABDOMEN AND PELVIS WITH CONTRAST TECHNIQUE: Multidetector CT imaging of the abdomen and pelvis was performed using the standard protocol following bolus administration of intravenous contrast. CONTRAST:  170mL ISOVUE-300 IOPAMIDOL (ISOVUE-300) INJECTION 61% COMPARISON:  04/27/2017 FINDINGS: Lower chest: No acute abnormality. Hepatobiliary: Subcentimeter low-density lesion noted in segment 6, likely a cyst. No other liver abnormality. Normal gallbladder. No bile duct dilation. Pancreas: Unremarkable. No pancreatic ductal dilatation or surrounding inflammatory changes. Spleen: Normal in size without focal abnormality. Adrenals/Urinary Tract: Adrenal glands are unremarkable. Kidneys are normal, without renal calculi, focal lesion, or hydronephrosis. Bladder is unremarkable. Stomach/Bowel: Small to moderate hiatal hernia. Stomach otherwise unremarkable. Normal small bowel. Subtle stranding noted in the mesentery adjacent to the sigmoid colon where there are multiple diverticula. This was similarly present on the prior CT and may be chronic. Milder complicated diverticulitis should be considered, although there is no apparent colon wall thickening to support diverticulitis. Diverticular seen scattered elsewhere throughout the colon. No other evidence of inflammation. Vascular/Lymphatic: No significant vascular findings are present. No enlarged abdominal or pelvic lymph nodes. Reproductive: Contour bulge from the posterior aspect of the uterus, likely a subserosal fibroid, stable from the prior CT. No other uterine abnormality. No adnexal/ovarian masses. Other: Trace pelvic free fluid, likely physiologic. Small fat containing umbilical hernia,  stable from the prior CT. Musculoskeletal: Mild wedge-shaped compression deformity of L1, unchanged from the prior CT. No other skeletal abnormality. IMPRESSION: 1. Possible mild sigmoid colon diverticulitis. This is suggested by mild stranding in the fat adjacent to the sigmoid colon. This was present on the prior CT. Suspect that this is chronic and not due to active diverticulitis. 2. No other evidence of an acute abnormality within the abdomen or pelvis. 3. Small to moderate hiatal hernia. Electronically Signed   By: Lajean Manes M.D.   On: 07/18/2017 13:49   [place] Art/ven Flow Abd Pelv Doppler  Result Date: 07/18/2017 CLINICAL DATA:  Pelvic pain and vaginal bleeding for the past 2 days. EXAM: TRANSABDOMINAL AND TRANSVAGINAL ULTRASOUND OF PELVIS DOPPLER ULTRASOUND OF OVARIES TECHNIQUE: Both transabdominal and transvaginal ultrasound examinations of the pelvis were performed. Transabdominal technique was performed for global imaging of the pelvis including uterus, ovaries, adnexal regions, and pelvic cul-de-sac. It was necessary to proceed with endovaginal exam following the transabdominal exam to visualize the ovaries. Color and duplex Doppler ultrasound was utilized to evaluate blood flow to the ovaries. COMPARISON:  CT abdomen pelvis from same day. Pelvic ultrasound dated October 14, 2015. FINDINGS: Uterus Measurements: 9.7 x 6.0 x 6.5 cm. Heterogeneous in appearance with at least two fibroids noted. 3.6 x 3.1 x 3.5 cm subserosal fibroid in the posterior uterine body. 2.6 x 1.9 x 3.0 cm intramural fibroid in the anterior uterine body. No mucosal component. Endometrium Thickness: 11 mm.  No focal abnormality visualized. Right ovary Measurements: 2.5 x 2.1 x 2.3 cm. Normal appearance/no adnexal mass. Left ovary Measurements: 3.1 x 2.3 x 2.3 cm. Normal appearance/no adnexal mass. Pulsed Doppler evaluation of both ovaries demonstrates normal low-resistance arterial and venous waveforms. Other findings Trace  free fluid. IMPRESSION: 1. No acute abnormality. 2. Fibroid uterus as described above.  No mucosal component. Electronically Signed   By: Orville Govern.D.  On: 07/18/2017 16:55   Final diagnoses:  Lower abdominal pain  Nausea and vomiting in adult  Diverticulitis  Vaginal bleeding  Bacterial vaginosis  Uterine leiomyoma, unspecified location      Janet Berlin 07/18/17 1746    Tanna Furry, MD 07/23/17 [phone]

## 2017-07-18 NOTE — ED Notes (Signed)
I just saw our P.A. Speaking with pt. Upon being asked, pt. States she is "still hurting". Within 2 minutes of having this conversation, pt. Is sleeping soundly and actually snoring.

## 2017-07-18 NOTE — ED Provider Notes (Signed)
[facility] DEPT Provider Note   CSN: [id number] Arrival date & time: 07/18/17  6160     History   Chief Complaint Chief Complaint  Patient presents with  . Abdominal Pain    HPI NALIA HONEYCUTT is a 47 y.o. female with past medical history of diverticulitis, hiatal hernia, hypertension, IBS, colon adenoma who presents emergency department today for lower abdominal pain.  Patient reports 2 days ago she started developing diffuse lower abdominal pain without radiation.  She reports that the pain waxes and waning's with intense cramping like pain that she rates as a 10/10.  She reports constant nausea with associated nonbilious, nonbloody emesis for the last 2 days.  She reports 3 episodes of the last 24 hours.  Patient reports she has taken over-the-counter medication for symptoms but has not been able to tolerate it due to nausea and vomiting. She feels like this is similar to the diverticulitis she has had in the past. Patient denies any fever, chills, chest pain, shortness of breath, cough, flank pain, dysuria, hematuria, urgency, diarrhea, melena, hematochezia.  She has had prior appendectomy.  She reports normal bowel movement yesterday. She is still passing gas.   Patient also reports over the last 4 months she has had monthly vaginal bleeding.  She reports that for 6 months prior after ablation she did not have any vaginal bleeding.  She reports her most recent episode of vaginal bleeding began 4 days ago.  She denies any clots.  She reports she uses ~ads approximately 2 or 3 times a day.  She reports she is sexually active.  She would like to be tested for STDs.  She denies any vaginal discharge.  HPI  Past Medical History:  Diagnosis Date  . Abdominal pain, unspecified site   . Allergy   . Anemia   . Asthma   . Chlamydia   . Diverticulitis   . Esophageal reflux   . Hiatal hernia   . Hypertension   . IBS (irritable bowel syndrome)   . Insomnia    . Lactose intolerance   . Migraine equivalent 08/2014   minor right arm weakness in [facility]. neg CVA on imaging.   . Other constipation   . Personal history of colonic polyps 09/12/2010   tubular adenoma  . Trichimoniasis   . Tubular adenoma of colon   . Umbilical hernia   . Uterine fibroid     Patient Active Problem List   Diagnosis Date Noted  . Diverticulosis of sigmoid colon 04/27/2016  . Acute diverticulitis of intestine 04/27/2016  . Diverticula of colon 05/21/2015  . Asthma 05/21/2015  . Acute diverticulitis 05/21/2015  . Diverticulitis of sigmoid colon 06/02/2011  . Nausea and vomiting 06/02/2011  . Otitis 06/02/2011  . HTN (hypertension) 06/02/2011  . GERD (gastroesophageal reflux disease) 10/21/2010  . Gluten intolerance 10/21/2010  . Personal history of colonic polyps 10/21/2010  . Flatulence, eructation, and gas pain 09/12/2010  . 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 Irritable bowel syndrome 09/12/2010  . Abdominal pain 08/26/2010  . Bloating 08/26/2010  . 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 IBS (irritable bowel syndrome) 08/26/2010  . CONSTIPATION, CHRONIC 05/11/2007  . ABDOMINAL PAIN, CHRONIC 05/11/2007    Past Surgical History:  Procedure Laterality Date  . APPENDECTOMY    . COLONOSCOPY    . DILATION AND CURETTAGE OF UTERUS     x2 for EAB  . HYSTEROSCOPY WITH NOVASURE N/A 12/17/2015   Procedure: HYSTEROSCOPY WITH  NOVASURE;  Surgeon: Aletha Halim, MD;  Location: [facility];  Service: Gynecology;  Laterality: N/A;  . TUBAL LIGATION       OB History    Gravida  7   Para  3   Term  3   Preterm      AB  4   Living  3     SAB  1   TAB  3   Ectopic      Multiple      Live Births           Obstetric Comments  svd x 3         Home Medications    Prior to Admission medications   Medication Sig Start Date End Date Taking? Authorizing Provider  ibuprofen (ADVIL,MOTRIN) 200  MG tablet Take 200 mg by mouth every 6 (six) hours as needed for moderate pain.   Yes [provider]  losartan (COZAAR) 50 MG tablet Take 50 mg by mouth daily.   Yes [provider]  ciprofloxacin (CIPRO) 500 MG tablet Take 1 tablet (500 mg total) by mouth 2 (two) times daily. Patient not taking: Reported on 06/07/2017 04/27/17   Petrucelli, Aldona Bar R, PA-C  clonazePAM (KLONOPIN) 1 MG tablet Take 1 mg by mouth 2 (two) times daily as needed for anxiety.  06/08/17   [provider]  metroNIDAZOLE (FLAGYL) 500 MG tablet Take 1 tablet (500 mg total) by mouth 3 (three) times daily. Patient not taking: Reported on 06/07/2017 04/27/17   Petrucelli, Samantha R, PA-C  ondansetron (ZOFRAN ODT) 4 MG disintegrating tablet Take 1 tablet (4 mg total) by mouth every 8 (eight) hours as needed for nausea or vomiting. Patient not taking: Reported on 06/07/2017 04/27/17   Petrucelli, Glynda Jaeger, PA-C  oxyCODONE-acetaminophen (PERCOCET/ROXICET) 5-325 MG tablet Take 1 tablet by mouth every 6 (six) hours as needed for severe pain. Patient not taking: Reported on 06/07/2017 04/27/17   Petrucelli, Aldona Bar R, PA-C  PREMARIN vaginal cream INSERT 1 APPLICATORFUL VAGINALLY AT BEDTIME AS DIRECTED 06/08/17   [provider]  promethazine-dextromethorphan (PROMETHAZINE-DM) 6.25-15 MG/5ML syrup TAKE 1 TEASPOONFUL (5 ML) BY MOUTH EVERY 6 HOURS 06/03/17   [provider]    Family History Family History  Problem Relation Age of Onset  . Hypertension Mother   . Cancer Mother        not sure of type  . Colon cancer Neg Hx   . Esophageal cancer Neg Hx   . Rectal cancer Neg Hx   . Stomach cancer Neg Hx     Social History Social History   Tobacco Use  . Smoking status: Never Smoker  . Smokeless tobacco: Never Used  Substance Use Topics  . Alcohol use: No  . Drug use: No     Allergies   Lactose intolerance (gi)   Review of Systems Review of Systems  All other systems reviewed and  are negative.    Physical Exam Updated Vital Signs BP 134/73   Pulse 88   Temp 97.9 F (36.6 C) (Oral)   Resp 16   SpO2 93%   Physical Exam  Constitutional: She appears well-developed and well-nourished.  HENT:  Head: Normocephalic and atraumatic.  Right Ear: External ear normal.  Left Ear: External ear normal.  Nose: Nose normal.  Mouth/Throat: Uvula is midline, oropharynx is clear and moist and mucous membranes are normal. No tonsillar exudate.  Eyes: Pupils are equal, round, and reactive to light. Right eye exhibits no discharge. Left  eye exhibits no discharge. No scleral icterus.  Neck: Trachea normal. Neck supple. No spinous process tenderness present. No neck rigidity. Normal range of motion present.  Cardiovascular: Normal rate, regular rhythm and intact distal pulses.  No murmur heard. Pulses:      Radial pulses are 2+ on the right side, and 2+ on the left side.       Dorsalis pedis pulses are 2+ on the right side, and 2+ on the left side.       Posterior tibial pulses are 2+ on the right side, and 2+ on the left side.  No lower extremity swelling or edema. Calves symmetric in size bilaterally.  Pulmonary/Chest: Effort normal and breath sounds normal. She exhibits no tenderness.  Abdominal: Soft. Bowel sounds are normal. There is tenderness in the right lower quadrant, suprapubic area and left lower quadrant. There is no rigidity, no rebound, no guarding and no CVA tenderness.  Appearance normal. No erythema, jaundice or ascites. Abdomen is soft.  There is tenderness of the lower abdomen.  This is not out of proportion.  There appears to be mild distention however patient denies this.  No rebound, rigidity or guarding. Bowel sounds are present in all four quadrants. No distension. Negative Murphy's sign.  No McBurney's point tenderness. Negative Rovsing, psoas and obturator sign.  Genitourinary:  Genitourinary Comments: Exam performed by Jillyn Ledger, exam  chaperoned Pelvic exam: normal external genitalia without evidence of trauma. VULVA: normal appearing vulva with no masses, tenderness or lesion. VAGINA: normal appearing vagina with normal color and discharge, no lesions. CERVIX: normal appearing cervix without lesions, cervical motion tenderness absent, cervical os closed with mild amount bleeding in vaginal vault. Wet prep and DNA probe for chlamydia and GC obtained.   ADNEXA: normal adnexa in size, nontender and no masses UTERUS: uterus is normal size, shape, consistency and nontender.   Musculoskeletal: She exhibits no edema.  Lymphadenopathy:    She has no cervical adenopathy.  Neurological: She is alert.  Skin: Skin is warm and dry. No rash noted. She is not diaphoretic.  Psychiatric: She has a normal mood and affect.  Nursing note and vitals reviewed.    ED Treatments / Results  Labs (all labs ordered are listed, but only abnormal results are displayed) Labs Reviewed  WET PREP, GENITAL - Abnormal; Notable for the following components:      Result Value   Clue Cells Wet Prep HPF POC PRESENT (*)    WBC, Wet Prep HPF POC MODERATE (*)    All other components within normal limits  COMPREHENSIVE METABOLIC PANEL - Abnormal; Notable for the following components:   Glucose, Bld 102 (*)    Calcium 8.8 (*)    All other components within normal limits  URINALYSIS, ROUTINE W REFLEX MICROSCOPIC - Abnormal; Notable for the following components:   Color, Urine AMBER (*)    APPearance HAZY (*)    Hgb urine dipstick LARGE (*)    Protein, ur 100 (*)    Leukocytes, UA TRACE (*)    All other components within normal limits  URINALYSIS, MICROSCOPIC (REFLEX) - Abnormal; Notable for the following components:   Bacteria, UA MANY (*)    All other components within normal limits  I-STAT BETA HCG BLOOD, ED ([facility], [facility], [facility] ONLY) - Abnormal; Notable for the following components:   I-stat hCG, quantitative 9.0 (*)    All other components within normal  limits  LIPASE, BLOOD  CBC WITH DIFFERENTIAL/PLATELET  PREGNANCY, URINE  RPR  HIV ANTIBODY (ROUTINE TESTING)  GC/CHLAMYDIA PROBE AMP ([place]) NOT AT [facility]    EKG None  Radiology Ct Abdomen Pelvis W Contrast  Result Date: 07/18/2017 CLINICAL DATA:  Abdominal distension, nausea and vomiting. EXAM: CT ABDOMEN AND PELVIS WITH CONTRAST TECHNIQUE: Multidetector CT imaging of the abdomen and pelvis was performed using the standard protocol following bolus administration of intravenous contrast. CONTRAST:  136mL ISOVUE-300 IOPAMIDOL (ISOVUE-300) INJECTION 61% COMPARISON:  04/27/2017 FINDINGS: Lower chest: No acute abnormality. Hepatobiliary: Subcentimeter low-density lesion noted in segment 6, likely a cyst. No other liver abnormality. Normal gallbladder. No bile duct dilation. Pancreas: Unremarkable. No pancreatic ductal dilatation or surrounding inflammatory changes. Spleen: Normal in size without focal abnormality. Adrenals/Urinary Tract: Adrenal glands are unremarkable. Kidneys are normal, without renal calculi, focal lesion, or hydronephrosis. Bladder is unremarkable. Stomach/Bowel: Small to moderate hiatal hernia. Stomach otherwise unremarkable. Normal small bowel. Subtle stranding noted in the mesentery adjacent to the sigmoid colon where there are multiple diverticula. This was similarly present on the prior CT and may be chronic. Milder complicated diverticulitis should be considered, although there is no apparent colon wall thickening to support diverticulitis. Diverticular seen scattered elsewhere throughout the colon. No other evidence of inflammation. Vascular/Lymphatic: No significant vascular findings are present. No enlarged abdominal or pelvic lymph nodes. Reproductive: Contour bulge from the posterior aspect of the uterus, likely a subserosal fibroid, stable from the prior CT. No other uterine abnormality. No adnexal/ovarian masses. Other: Trace pelvic free fluid, likely physiologic.  Small fat containing umbilical hernia, stable from the prior CT. Musculoskeletal: Mild wedge-shaped compression deformity of L1, unchanged from the prior CT. No other skeletal abnormality. IMPRESSION: 1. Possible mild sigmoid colon diverticulitis. This is suggested by mild stranding in the fat adjacent to the sigmoid colon. This was present on the prior CT. Suspect that this is chronic and not due to active diverticulitis. 2. No other evidence of an acute abnormality within the abdomen or pelvis. 3. Small to moderate hiatal hernia. Electronically Signed   By: Lajean Manes M.D.   On: 07/18/2017 13:49    Procedures Procedures (including critical care time)  Medications Ordered in ED Medications  iopamidol (ISOVUE-300) 61 % injection (has no administration in time range)  morphine 4 MG/ML injection 4 mg (4 mg Intramuscular Given 07/18/17 1015)  ondansetron (ZOFRAN-ODT) disintegrating tablet 4 mg (4 mg Oral Given 07/18/17 1015)  HYDROmorphone (DILAUDID) injection 1 mg (1 mg Intravenous Given 07/18/17 1310)  iopamidol (ISOVUE-300) 61 % injection 100 mL (100 mLs Intravenous Contrast Given 07/18/17 1253)     Initial Impression / Assessment and Plan / ED Course  I have reviewed the triage vital signs and the nursing notes.  Pertinent labs & imaging results that were available during my care of the patient were reviewed by me and considered in my medical decision making (see chart for details).     47 y.o. woman with a history of diverticulitis who presents emergency department today for lower abdominal pain with associated nausea and vomiting that she states is similar to the diverticulitis she has had in the past.  Patient also reports that she has been having vaginal bleeding over the last 4 months that has happened monthly and occurred for approximately 3-4 days.  She reports her most recent episode began 4 days ago.  On presentation the patient's vital signs are reassuring.  She is without fever,  tachycardia, tachypnea, hypoxia or hypotension.  She is nonseptic appearing.  Patient does have tenderness palpation of the lower  abdomen.  No peritoneal signs.  Pelvic exam reveals cervical loss that is closed.  No cervical motion tenderness.  There is mild amount of bleeding is visualized in the vaginal vault.  Will obtain lab work, UA, CT of the abdomen as well as pelvic ultrasound to evaluate patient's symptoms.  Urine pregnancy test is negative.  CBC without leukocytosis.  No anemia.  UA questionable for UTI.  Will send culture.  Lipase is within normal limits.  No significant electrolyte derangements.  Kidney function within normal limits.  No evidence of DKA.  LFTs within normal limits.  Bilirubin within normal limits.  CT of the abdomen pelvis with possible mild sigmoid colon diverticulitis.  This appears chronic according to results.  However given the patient's presentation will plan to treat with Cipro and Flagyl.  Patient's wet prep with evidence of BV.  Flagyl will cover this.  With pelvic ultrasound pending Case signed out to Benedetto Goad, PA-C with plan for disposition home if results are negative.  Plan to send patient home with Zofran for nausea, Cipro and Flagyl for treatment of diverticulitis.  Advised patient not to drink while taking Flagyl as this will cause patient feel very ill. It appears patient has been seen by Dr. Ilda Basset of OBGYN for vaginal bleeding in the past and he has recommended hysterectomy, given failed outpatient medical treatments. Will have patient follow up with him. No further interventions required on discharge.  Ultimate disposition as oncoming provider feels appropriate.  Final Clinical Impressions(s) / ED Diagnoses   Final diagnoses:  Lower abdominal pain  Nausea and vomiting in adult  Diverticulitis  Vaginal bleeding  Bacterial vaginosis    ED Discharge Orders    None       Lorelle Gibbs 07/18/17 1613    Charlesetta Shanks, MD 07/21/17  [phone]

## 2017-07-18 NOTE — ED Triage Notes (Signed)
Patient c/o lower abdominal pain with N/V x2 days. Denies diarrhea. Patient also c/o heavy vaginal bleeding x2 days. States "I shouldn't be having a menstrual cycle."

## 2017-07-18 NOTE — ED Notes (Addendum)
Pt getting [place] will do VT's when done

## 2017-07-18 NOTE — Progress Notes (Signed)
07-18-17  1210  IV Team Note;  Called to start iv for pt;  Pt not currently in ER Room 14;   Spoke w charge nurse who will start the iv when pt returns to her room.    Raynelle Fanning RN IV Team

## 2017-07-19 LAB — GC/CHLAMYDIA PROBE AMP (~~LOC~~) NOT AT ARMC
Chlamydia: NEGATIVE
NEISSERIA GONORRHEA: NEGATIVE

## 2017-07-19 LAB — RPR: RPR: NONREACTIVE

## 2017-07-19 LAB — HIV ANTIBODY (ROUTINE TESTING W REFLEX): HIV SCREEN 4TH GENERATION: NONREACTIVE

## 2017-07-20 LAB — URINE CULTURE: Culture: <10000 — AB

## 2017-07-22 ENCOUNTER — Ambulatory Visit: Payer: Medicaid Other | Admitting: Podiatry

## 2017-09-06 ENCOUNTER — Encounter: Payer: Self-pay | Admitting: *Deleted

## 2017-09-29 ENCOUNTER — Telehealth: Payer: Self-pay | Admitting: *Deleted

## 2017-09-29 ENCOUNTER — Other Ambulatory Visit: Payer: Self-pay | Admitting: Internal Medicine

## 2017-09-29 ENCOUNTER — Encounter

## 2017-09-29 ENCOUNTER — Ambulatory Visit (INDEPENDENT_AMBULATORY_CARE_PROVIDER_SITE_OTHER): Payer: Self-pay | Admitting: Internal Medicine

## 2017-09-29 ENCOUNTER — Encounter (INDEPENDENT_AMBULATORY_CARE_PROVIDER_SITE_OTHER): Payer: Self-pay

## 2017-09-29 ENCOUNTER — Encounter: Payer: Self-pay | Admitting: Internal Medicine

## 2017-09-29 VITALS — BP 132/80 | HR 67 | Ht 61.0 in | Wt 212.2 lb

## 2017-09-29 DIAGNOSIS — K449 Diaphragmatic hernia without obstruction or gangrene: Secondary | ICD-10-CM

## 2017-09-29 DIAGNOSIS — K921 Melena: Secondary | ICD-10-CM

## 2017-09-29 DIAGNOSIS — R110 Nausea: Secondary | ICD-10-CM

## 2017-09-29 DIAGNOSIS — Z8719 Personal history of other diseases of the digestive system: Secondary | ICD-10-CM

## 2017-09-29 DIAGNOSIS — K219 Gastro-esophageal reflux disease without esophagitis: Secondary | ICD-10-CM

## 2017-09-29 DIAGNOSIS — Z86010 Personal history of colonic polyps: Secondary | ICD-10-CM

## 2017-09-29 MED ORDER — PANTOPRAZOLE SODIUM 40 MG PO TBEC: 40.0000 mg | DELAYED_RELEASE_TABLET | Freq: Every day | ORAL | 0 refills | Status: DC

## 2017-09-29 MED ORDER — SUPREP BOWEL PREP KIT 17.5-3.13-1.6 GM/177ML PO SOLN: 1.0000 | ORAL | 0 refills | Status: DC

## 2017-09-29 MED ORDER — OMEPRAZOLE 40 MG PO CPDR: 40.0000 mg | DELAYED_RELEASE_CAPSULE | Freq: Every day | ORAL | 0 refills | Status: DC

## 2017-09-29 NOTE — Telephone Encounter (Signed)
Insurance will not approve pantoprazole. Per Dr Hilarie Fredrickson, okay for patient to have omeprazole 40 mg daily for now. Rx sent.

## 2017-09-29 NOTE — Patient Instructions (Signed)
We have sent the following medications to your pharmacy for you to pick up at your convenience: Pantoprazole 40 mg daily  You have been scheduled for a colonoscopy. Please follow written instructions given to you at your visit today.  Please pick up your prep supplies at the pharmacy within the next 1-3 days. If you use inhalers (even only as needed), please bring them with you on the day of your procedure. Your physician has requested that you go to www.startemmi.com and enter the access code given to you at your visit today. This web site gives a general overview about your procedure. However, you should still follow specific instructions given to you by our office regarding your preparation for the procedure.  If you are age 62 or older, your body mass index should be between 23-30. Your Body mass index is 40.09 kg/m. If this is out of the aforementioned range listed, please consider follow up with your Primary Care Provider.  If you are age 50 or younger, your body mass index should be between 19-25. Your Body mass index is 40.09 kg/m. If this is out of the aformentioned range listed, please consider follow up with your Primary Care Provider.

## 2017-09-29 NOTE — Progress Notes (Signed)
HPI: Briana Wade is a 47 year old female with a history of GERD, moderate sized hiatal hernia, adenomatous colon polyps, colonic diverticulosis with history of diverticulitis who is seen in consultation at the request of Dr. Vista Lawman to evaluate recurrent diverticulitis.  She is here alone today.  I saw the patient on 04/05/2015 to evaluate GERD, left upper quadrant pain and constipation.  She has a history of adenomatous polyps initially a colonoscopy in 2012 performed by Dr. Sharlett Iles.  Follow-up colonoscopy in January 2015 showed diverticulosis but no further polyps.  5-year recall was recommended.  She had prior upper endoscopy performed by Dr. Sharlett Iles in January 2015.  This showed linear erosions and severe free reflux along with a 4 cm hiatal hernia.  Mucosa of the stomach appeared normal as was the duodenum.  Most recently her biggest issue has been recurrent diverticulitis.  She was treated for diverticulitis in June 2019, April 2019.  She also had CT scans for left-sided abdominal pain in June 2019, April 2019, December 2018 and September 2018.  She reports that she has had issues with recurrent left-sided abdominal pain.  This can start out very mild but then progressed to become moderate to severe.  Most recently this was in June and she was treated with antibiotics after CT scan suggested mild sigmoid diverticulitis.  Antibiotics almost always lead to resolution of her symptoms.  Currently she is having no abdominal pain.  Last week she did see blood in her stool which was dark red in color.  This was a new finding for her.  But has not been present in her stool until most recently.  This was painless in nature.  Separate from her bowel change and diverticulitis she has had issue with recurrent nausea.  This can be moderate to severe in nature.  No vomiting.  She also reports fairly severe reflux worse with foods such as rice, hot dogs and pizza.  She is not currently taking acid  suppression medication.  On review of my note I had prescribed pantoprazole 2 years ago she is unsure why this medicine is no longer being used nor does she recall if it was helpful.   Past Medical History:  Diagnosis Date  . Abdominal pain, unspecified site   . Allergy   . Anemia   . Asthma   . Chlamydia   . Diverticulitis   . Esophageal reflux   . Hiatal hernia   . Hypertension   . IBS (irritable bowel syndrome)   . Insomnia   . Lactose intolerance   . Migraine equivalent 08/2014   minor right arm weakness in [facility]. neg CVA on imaging.   . Other constipation   . Personal history of colonic polyps 09/12/2010   tubular adenoma  . Trichimoniasis   . Tubular adenoma of colon   . Umbilical hernia   . Uterine fibroid   . Uterine leiomyoma     Past Surgical History:  Procedure Laterality Date  . APPENDECTOMY    . COLONOSCOPY    . DILATION AND CURETTAGE OF UTERUS     x2 for EAB  . HYSTEROSCOPY WITH NOVASURE N/A 12/17/2015   Procedure: HYSTEROSCOPY WITH NOVASURE;  Surgeon: Aletha Halim, MD;  Location: [facility];  Service: Gynecology;  Laterality: N/A;  . TUBAL LIGATION      Outpatient Medications Prior to Visit  Medication Sig Dispense Refill  . clonazePAM (KLONOPIN) 1 MG tablet Take 1 mg by mouth 2 (two) times daily as needed for anxiety.  1  . ibuprofen (ADVIL,MOTRIN) 200 MG tablet Take 200 mg by mouth every 6 (six) hours as needed for moderate pain.    Marland Kitchen losartan (COZAAR) 50 MG tablet Take 50 mg by mouth daily.  1  . ondansetron (ZOFRAN) 4 MG tablet Take 1 tablet (4 mg total) by mouth every 8 (eight) hours as needed for nausea or vomiting. 4 tablet 0  . ciprofloxacin (CIPRO) 500 MG tablet Take 1 tablet (500 mg total) by mouth 2 (two) times daily. (Patient not taking: Reported on 09/29/2017) 14 tablet 0  . metroNIDAZOLE (FLAGYL) 500 MG tablet Take 1 tablet (500 mg total) by mouth 2 (two) times daily with a meal. DO NOT CONSUME ALCOHOL WHILE TAKING THIS MEDICATION. (Patient not  taking: Reported on 09/29/2017) 14 tablet 0  . ondansetron (ZOFRAN ODT) 4 MG disintegrating tablet Take 1 tablet (4 mg total) by mouth every 8 (eight) hours as needed for nausea or vomiting. (Patient not taking: Reported on 09/29/2017) 8 tablet 0  . oxyCODONE-acetaminophen (PERCOCET/ROXICET) 5-325 MG tablet Take 1 tablet by mouth every 6 (six) hours as needed for severe pain. (Patient not taking: Reported on 09/29/2017) 10 tablet 0  . PREMARIN vaginal cream INSERT 1 APPLICATORFUL VAGINALLY AT BEDTIME AS DIRECTED  1  . promethazine-dextromethorphan (PROMETHAZINE-DM) 6.25-15 MG/5ML syrup TAKE 1 TEASPOONFUL (5 ML) BY MOUTH EVERY 6 HOURS  0   No facility-administered medications prior to visit.     Allergies  Allergen Reactions  . Lactose Intolerance (Gi)     Family History  Problem Relation Age of Onset  . Hypertension Mother   . Cancer Mother        not sure of type  . Colon cancer Neg Hx   . Esophageal cancer Neg Hx   . Rectal cancer Neg Hx   . Stomach cancer Neg Hx     Social History   Tobacco Use  . Smoking status: Never Smoker  . Smokeless tobacco: Never Used  Substance Use Topics  . Alcohol use: No  . Drug use: No    ROS: As per history of present illness, otherwise negative  BP 132/80   Pulse 67   Ht 5\' 1"  (1.549 m)   Wt 212 lb 3.2 oz (96.3 kg)   SpO2 96%   BMI 40.09 kg/m  Constitutional: Well-developed and well-nourished. No distress. HEENT: Normocephalic and atraumatic. Conjunctivae are normal.  No scleral icterus. Neck: Neck supple. Trachea midline. Cardiovascular: Normal rate, regular rhythm and intact distal pulses. No M/R/G Pulmonary/chest: Effort normal and breath sounds normal. No wheezing, rales or rhonchi. Abdominal: Soft, nontender, nondistended. Bowel sounds active throughout. There are no masses palpable. No hepatosplenomegaly. Extremities: no clubbing, cyanosis, or edema Neurological: Alert and oriented to person place and time. Skin: Skin is warm and  dry.  Psychiatric: Normal mood and affect. Behavior is normal.  RELEVANT LABS AND IMAGING: CBC    Component Value Date/Time   WBC 8.8 07/18/2017 1245   RBC 4.38 07/18/2017 1245   HGB 12.3 07/18/2017 1245   HGB 12.0 06/07/2017 1738   HCT 38.1 07/18/2017 1245   HCT 37.6 06/07/2017 1738   PLT 298 07/18/2017 1245   PLT 294 06/07/2017 1738   MCV 87.0 07/18/2017 1245   MCV 85 06/07/2017 1738   MCH 28.1 07/18/2017 1245   MCHC 32.3 07/18/2017 1245   RDW 14.1 07/18/2017 1245   RDW 13.4 06/07/2017 1738   LYMPHSABS 0.7 07/18/2017 1245   MONOABS 0.4 07/18/2017 1245   EOSABS 0.0 07/18/2017  1245   BASOSABS 0.0 07/18/2017 1245    CMP     Component Value Date/Time   NA 135 07/18/2017 0846   K 4.9 07/18/2017 0846   CL 104 07/18/2017 0846   CO2 24 07/18/2017 0846   GLUCOSE 102 (H) 07/18/2017 0846   BUN 9 07/18/2017 0846   CREATININE 0.79 07/18/2017 0846   CALCIUM 8.8 (L) 07/18/2017 0846   PROT 7.4 07/18/2017 0846   ALBUMIN 3.8 07/18/2017 0846   AST 29 07/18/2017 0846   ALT 16 07/18/2017 0846   ALKPHOS 78 07/18/2017 0846   BILITOT 1.1 07/18/2017 0846   GFRNONAA >60 07/18/2017 0846   GFRAA >60 07/18/2017 0846   CT ABDOMEN AND PELVIS WITH CONTRAST   TECHNIQUE: Multidetector CT imaging of the abdomen and pelvis was performed using the standard protocol following bolus administration of intravenous contrast.   CONTRAST:  140mL ISOVUE-300 IOPAMIDOL (ISOVUE-300) INJECTION 61%   COMPARISON:  04/27/2017   FINDINGS: Lower chest: No acute abnormality.   Hepatobiliary: Subcentimeter low-density lesion noted in segment 6, likely a cyst. No other liver abnormality. Normal gallbladder. No bile duct dilation.   Pancreas: Unremarkable. No pancreatic ductal dilatation or surrounding inflammatory changes.   Spleen: Normal in size without focal abnormality.   Adrenals/Urinary Tract: Adrenal glands are unremarkable. Kidneys are normal, without renal calculi, focal lesion, or  hydronephrosis. Bladder is unremarkable.   Stomach/Bowel: Small to moderate hiatal hernia. Stomach otherwise unremarkable. Normal small bowel.   Subtle stranding noted in the mesentery adjacent to the sigmoid colon where there are multiple diverticula. This was similarly present on the prior CT and may be chronic. Milder complicated diverticulitis should be considered, although there is no apparent colon wall thickening to support diverticulitis. Diverticular seen scattered elsewhere throughout the colon. No other evidence of inflammation.   Vascular/Lymphatic: No significant vascular findings are present. No enlarged abdominal or pelvic lymph nodes.   Reproductive: Contour bulge from the posterior aspect of the uterus, likely a subserosal fibroid, stable from the prior CT. No other uterine abnormality. No adnexal/ovarian masses.   Other: Trace pelvic free fluid, likely physiologic. Small fat containing umbilical hernia, stable from the prior CT.   Musculoskeletal: Mild wedge-shaped compression deformity of L1, unchanged from the prior CT. No other skeletal abnormality.   IMPRESSION: 1. Possible mild sigmoid colon diverticulitis. This is suggested by mild stranding in the fat adjacent to the sigmoid colon. This was present on the prior CT. Suspect that this is chronic and not due to active diverticulitis. 2. No other evidence of an acute abnormality within the abdomen or pelvis. 3. Small to moderate hiatal hernia.     Electronically Signed   By: Lajean Manes M.D.   On: 07/18/2017 13:49    ASSESSMENT/PLAN: 47 year old female with a history of GERD, moderate sized hiatal hernia, adenomatous colon polyps, colonic diverticulosis with history of diverticulitis who is seen in consultation at the request of Dr. Vista Lawman to evaluate recurrent diverticulitis.  1. Recurrent diverticulitis/hx of colon polyps/blood in stools --she has had multiple CT scans over the last 12 months  and on 2 occasions had acute diverticulitis.  We discussed this today.  I recommended we repeat colonoscopy given the frequency of her diverticulitis, the blood she saw on her stool recently and her history of polyps.  We discussed the risk, benefits and alternatives and she is agreeable and wishes to proceed.  I recommended that she notify us immediately when she has recurrent left-sided abdominal pain reminiscent of prior diverticulitis  so that she can be treated appropriately.  During this time she should reduce her diet to liquids as tolerated.  If she continues to have recurrent diverticulitis and taken into account findings at colonoscopy we could consider sigmoidectomy.  2. GERD/hiatal hernia/nausea without vomiting --I recommended we restart/retry pantoprazole 40 mg daily.  Her nausea could be related to reflux or her large hiatal hernia and history of Cameron's lesions.  GERD diet reviewed and recommended.    [id number], [address], [place], [place] [id number]

## 2017-10-18 ENCOUNTER — Encounter: Payer: Self-pay | Admitting: Internal Medicine

## 2018-02-07 ENCOUNTER — Emergency Department (HOSPITAL_COMMUNITY): Payer: Medicaid Other

## 2018-02-07 ENCOUNTER — Encounter (HOSPITAL_COMMUNITY): Payer: Self-pay

## 2018-02-07 ENCOUNTER — Emergency Department (HOSPITAL_COMMUNITY)
Admission: EM | Admit: 2018-02-07 | Discharge: 2018-02-07 | Disposition: A | Discharge status: Home / Self Care | Payer: Medicaid Other | Attending: Emergency Medicine | Admitting: Emergency Medicine

## 2018-02-07 ENCOUNTER — Other Ambulatory Visit: Payer: Self-pay

## 2018-02-07 DIAGNOSIS — J45909 Unspecified asthma, uncomplicated: Secondary | ICD-10-CM | POA: Insufficient documentation

## 2018-02-07 DIAGNOSIS — K5792 Diverticulitis of intestine, part unspecified, without perforation or abscess without bleeding: Secondary | ICD-10-CM | POA: Insufficient documentation

## 2018-02-07 DIAGNOSIS — Z85038 Personal history of other malignant neoplasm of large intestine: Secondary | ICD-10-CM | POA: Diagnosis not present

## 2018-02-07 DIAGNOSIS — R1030 Lower abdominal pain, unspecified: Secondary | ICD-10-CM | POA: Diagnosis present

## 2018-02-07 DIAGNOSIS — Z79899 Other long term (current) drug therapy: Secondary | ICD-10-CM | POA: Diagnosis not present

## 2018-02-07 DIAGNOSIS — K5732 Diverticulitis of large intestine without perforation or abscess without bleeding: Secondary | ICD-10-CM

## 2018-02-07 LAB — URINALYSIS, ROUTINE W REFLEX MICROSCOPIC
GLUCOSE, UA: NEGATIVE mg/dL
KETONES UR: 20 mg/dL — AB
Nitrite: NEGATIVE
PROTEIN: 100 mg/dL — AB
Specific Gravity, Urine: 1.033 — ABNORMAL HIGH (ref 1.005–1.030)
pH: 5 (ref 5.0–8.0)

## 2018-02-07 LAB — CBC WITH DIFFERENTIAL/PLATELET
Abs Immature Granulocytes: 0.04 10*3/uL (ref 0.00–0.07)
BASOS ABS: 0 10*3/uL (ref 0.0–0.1)
BASOS PCT: 0 %
Eosinophils Absolute: 0 10*3/uL (ref 0.0–0.5)
Eosinophils Relative: 0 %
HCT: 45.8 % (ref 36.0–46.0)
HEMOGLOBIN: 14.5 g/dL (ref 12.0–15.0)
IMMATURE GRANULOCYTES: 1 %
Lymphocytes Relative: 12 %
Lymphs Abs: 0.9 10*3/uL (ref 0.7–4.0)
MCH: 27.3 pg (ref 26.0–34.0)
MCHC: 31.7 g/dL (ref 30.0–36.0)
MCV: 86.3 fL (ref 80.0–100.0)
MONOS PCT: 15 %
Monocytes Absolute: 1.1 10*3/uL — ABNORMAL HIGH (ref 0.1–1.0)
NEUTROS ABS: 5.3 10*3/uL (ref 1.7–7.7)
NEUTROS PCT: 72 %
Platelets: 310 10*3/uL (ref 150–400)
RBC: 5.31 MIL/uL — AB (ref 3.87–5.11)
RDW: 13.8 % (ref 11.5–15.5)
WBC: 7.3 10*3/uL (ref 4.0–10.5)
nRBC: 0 % (ref 0.0–0.2)

## 2018-02-07 LAB — COMPREHENSIVE METABOLIC PANEL
ALBUMIN: 4 g/dL (ref 3.5–5.0)
ALT: 22 U/L (ref 0–44)
AST: 21 U/L (ref 15–41)
Alkaline Phosphatase: 80 U/L (ref 38–126)
Anion gap: 12 (ref 5–15)
BILIRUBIN TOTAL: 0.8 mg/dL (ref 0.3–1.2)
BUN: 22 mg/dL — AB (ref 6–20)
CHLORIDE: 101 mmol/L (ref 98–111)
CO2: 24 mmol/L (ref 22–32)
CREATININE: 1.08 mg/dL — AB (ref 0.44–1.00)
Calcium: 8.8 mg/dL — ABNORMAL LOW (ref 8.9–10.3)
GFR calc Af Amer: >60 mL/min (ref >60)
GFR calc non Af Amer: >60 mL/min (ref >60)
GLUCOSE: 114 mg/dL — AB (ref 70–99)
Potassium: 3.5 mmol/L (ref 3.5–5.1)
Sodium: 137 mmol/L (ref 135–145)
Total Protein: 8.3 g/dL — ABNORMAL HIGH (ref 6.5–8.1)

## 2018-02-07 LAB — PREGNANCY, URINE: PREG TEST UR: NEGATIVE

## 2018-02-07 MED ORDER — SODIUM CHLORIDE 0.9 % IV BOLUS
1000.0000 mL | Freq: Once | INTRAVENOUS | Status: AC
  Administered 2018-02-07: 1000 mL via INTRAVENOUS

## 2018-02-07 MED ORDER — SODIUM CHLORIDE (PF) 0.9 % IJ SOLN
INTRAMUSCULAR | Status: AC
  Filled 2018-02-07: qty 50

## 2018-02-07 MED ORDER — IOPAMIDOL (ISOVUE-300) INJECTION 61%
100.0000 mL | Freq: Once | INTRAVENOUS | Status: AC | PRN
  Administered 2018-02-07: 100 mL via INTRAVENOUS

## 2018-02-07 MED ORDER — AMOXICILLIN-POT CLAVULANATE 875-125 MG PO TABS
1.0000 | ORAL_TABLET | Freq: Once | ORAL | Status: AC
  Administered 2018-02-07: 1 via ORAL
  Filled 2018-02-07: qty 1

## 2018-02-07 MED ORDER — AMOXICILLIN-POT CLAVULANATE 875-125 MG PO TABS: 1.0000 | ORAL_TABLET | Freq: Three times a day (TID) | ORAL | 0 refills | Status: DC

## 2018-02-07 MED ORDER — OXYCODONE-ACETAMINOPHEN 5-325 MG PO TABS: 1.0000 | ORAL_TABLET | Freq: Four times a day (QID) | ORAL | 0 refills | Status: DC | PRN

## 2018-02-07 MED ORDER — ONDANSETRON HCL 4 MG/2ML IJ SOLN
4.0000 mg | Freq: Once | INTRAMUSCULAR | Status: AC
  Administered 2018-02-07: 4 mg via INTRAVENOUS
  Filled 2018-02-07: qty 2

## 2018-02-07 MED ORDER — IOPAMIDOL (ISOVUE-300) INJECTION 61%
INTRAVENOUS | Status: AC
  Filled 2018-02-07: qty 100

## 2018-02-07 MED ORDER — HYDROMORPHONE HCL 1 MG/ML IJ SOLN
0.5000 mg | Freq: Once | INTRAMUSCULAR | Status: AC
  Administered 2018-02-07: 0.5 mg via INTRAVENOUS
  Filled 2018-02-07: qty 1

## 2018-02-07 NOTE — Discharge Instructions (Signed)
Please read and follow all provided instructions.  Your diagnoses today include:  1. Sigmoid diverticulitis     Tests performed today include:  Blood counts and electrolytes  Blood tests to check liver and kidney function  Blood tests to check pancreas function  Urine test to look for infection   CT scan - shows diverticulitis  Vital signs. See below for your results today.   Medications prescribed:   Augmentin - antibiotic  You have been prescribed an antibiotic medicine: take the entire course of medicine even if you are feeling better. Stopping early can cause the antibiotic not to work.   Percocet (oxycodone/acetaminophen) - narcotic pain medication  DO NOT drive or perform any activities that require you to be awake and alert because this medicine can make you drowsy. BE VERY CAREFUL not to take multiple medicines containing Tylenol (also called acetaminophen). Doing so can lead to an overdose which can damage your liver and cause liver failure and possibly death.  Take any prescribed medications only as directed.  Home care instructions:   Follow any educational materials contained in this packet.  Follow-up instructions: Please follow-up with your primary care provider in the next 3 days for further evaluation of your symptoms.    Return instructions:  SEEK IMMEDIATE MEDICAL ATTENTION IF:  The pain does not go away or becomes severe   A temperature above 101F develops   Repeated vomiting occurs (multiple episodes)   The pain becomes localized to portions of the abdomen. The right side could possibly be appendicitis. In an adult, the left lower portion of the abdomen could be colitis or diverticulitis.   Blood is being passed in stools or vomit (bright red or black tarry stools)   You develop chest pain, difficulty breathing, dizziness or fainting, or become confused, poorly responsive, or inconsolable (young children)  If you have any other emergent  concerns regarding your health  Additional Information: Abdominal (belly) pain can be caused by many things. Your caregiver performed an examination and possibly ordered blood/urine tests and imaging (CT scan, x-rays, ultrasound). Many cases can be observed and treated at home after initial evaluation in the emergency department. Even though you are being discharged home, abdominal pain can be unpredictable. Therefore, you need a repeated exam if your pain does not resolve, returns, or worsens. Most patients with abdominal pain don't have to be admitted to the hospital or have surgery, but serious problems like appendicitis and gallbladder attacks can start out as nonspecific pain. Many abdominal conditions cannot be diagnosed in one visit, so follow-up evaluations are very important.  Your vital signs today were: BP (!) 144/91 (BP Location: Left Arm)    Pulse 89    Temp 98.1 F (36.7 C) (Oral)    Resp 18    Ht 5\' 1"  (1.549 m)    Wt 97.5 kg    LMP 01/31/2018    SpO2 96%    BMI 40.62 kg/m  If your blood pressure (bp) was elevated above 135/85 this visit, please have this repeated by your doctor within one month. --------------

## 2018-02-07 NOTE — ED Notes (Signed)
This RN unable to obtain PIV. Charge RN Lilia Pro requested to start ultrasound guided PIV. Patient updated on POC

## 2018-02-07 NOTE — ED Notes (Signed)
Attempt US guided IV unsuccessful X2; sites left AC and right AC.

## 2018-02-07 NOTE — ED Triage Notes (Signed)
Pt states lower abdominal and back pain since Friday, about equal on both sides. Pt states 10x emesis in the last 24 hours. Pt states diarrhea as well.

## 2018-02-07 NOTE — ED Notes (Signed)
Pt not able to void.  Informed RN.

## 2018-02-07 NOTE — ED Provider Notes (Signed)
[facility] DEPT Provider Note   CSN: [id number] Arrival date & time: 02/07/18  8338     History   Chief Complaint Chief Complaint  Patient presents with  . Abdominal Pain  . Back Pain    HPI Briana Wade is a 48 y.o. female.  Patient with history of IBS, uterine fibroids, diverticulitis, history of appendectomy --presents to the emergency department with 3 days of lower abdominal pain with radiation to the back.  Pain is sharp in nature.  She has had associated nausea, vomiting, and diarrhea.  Symptoms remind her of previous diverticulitis.  No fevers, chest pain, shortness of breath.  No dysuria, hematuria, increased frequency or urgency.  No treatments prior to arrival.  Records show 5-6 episodes of diverticulitis over the past 2 years.     Past Medical History:  Diagnosis Date  . Abdominal pain, unspecified site   . Allergy   . Anemia   . Asthma   . Chlamydia   . Diverticulitis   . Esophageal reflux   . Hiatal hernia   . Hypertension   . IBS (irritable bowel syndrome)   . Insomnia   . Lactose intolerance   . Migraine equivalent 08/2014   minor right arm weakness in [facility]. neg CVA on imaging.   . Other constipation   . Personal history of colonic polyps 09/12/2010   tubular adenoma  . Trichimoniasis   . Tubular adenoma of colon   . Umbilical hernia   . Uterine fibroid   . Uterine leiomyoma     Patient Active Problem List   Diagnosis Date Noted  . Diverticulosis of sigmoid colon 04/27/2016  . Acute diverticulitis of intestine 04/27/2016  . Diverticula of colon 05/21/2015  . Asthma 05/21/2015  . Acute diverticulitis 05/21/2015  . Diverticulitis of sigmoid colon 06/02/2011  . Nausea and vomiting 06/02/2011  . Otitis 06/02/2011  . HTN (hypertension) 06/02/2011  . GERD (gastroesophageal reflux disease) 10/21/2010  . Gluten intolerance 10/21/2010  . Personal history of colonic polyps 10/21/2010  . Flatulence, eructation,  and gas pain 09/12/2010  . 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 Irritable bowel syndrome 09/12/2010  . Abdominal pain 08/26/2010  . Bloating 08/26/2010  . 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 IBS (irritable bowel syndrome) 08/26/2010  . CONSTIPATION, CHRONIC 05/11/2007  . ABDOMINAL PAIN, CHRONIC 05/11/2007    Past Surgical History:  Procedure Laterality Date  . APPENDECTOMY    . COLONOSCOPY    . DILATION AND CURETTAGE OF UTERUS     x2 for EAB  . HYSTEROSCOPY WITH NOVASURE N/A 12/17/2015   Procedure: HYSTEROSCOPY WITH NOVASURE;  Surgeon: Aletha Halim, MD;  Location: [facility];  Service: Gynecology;  Laterality: N/A;  . TUBAL LIGATION       OB History    Gravida  7   Para  3   Term  3   Preterm      AB  4   Living  3     SAB  1   TAB  3   Ectopic      Multiple      Live Births           Obstetric Comments  svd x 3         Home Medications    Prior to Admission medications   Medication Sig Start Date End Date Taking? Authorizing Provider  clonazePAM (KLONOPIN) 1 MG tablet Take 1 mg by mouth 2 (two) times daily as  needed for anxiety.  06/08/17   [provider]  ibuprofen (ADVIL,MOTRIN) 200 MG tablet Take 200 mg by mouth every 6 (six) hours as needed for moderate pain.    [provider]  losartan (COZAAR) 50 MG tablet Take 50 mg by mouth daily.    [provider]  omeprazole (PRILOSEC) 40 MG capsule Take 1 capsule (40 mg total) by mouth daily. Please d/c script for pantoprazole. Insurance will not cover. 09/29/17   Pyrtle, Lajuan Lines, MD  ondansetron (ZOFRAN) 4 MG tablet Take 1 tablet (4 mg total) by mouth every 8 (eight) hours as needed for nausea or vomiting. 07/18/17   Maczis, Barth Kirks, PA-C  SUPREP BOWEL PREP KIT 17.5-3.13-1.6 GM/177ML SOLN Take 1 kit by mouth as directed. For colonoscopy prep 09/29/17   Pyrtle, Lajuan Lines, MD    Family History Family History  Problem Relation  Age of Onset  . Hypertension Mother   . Cancer Mother        not sure of type  . Colon cancer Neg Hx   . Esophageal cancer Neg Hx   . Rectal cancer Neg Hx   . Stomach cancer Neg Hx     Social History Social History   Tobacco Use  . Smoking status: Never Smoker  . Smokeless tobacco: Never Used  Substance Use Topics  . Alcohol use: No  . Drug use: No     Allergies   Lactose intolerance (gi)   Review of Systems Review of Systems  Constitutional: Negative for fever.  HENT: Negative for rhinorrhea and sore throat.   Eyes: Negative for redness.  Respiratory: Negative for cough and shortness of breath.   Cardiovascular: Negative for chest pain.  Gastrointestinal: Positive for abdominal pain, diarrhea, nausea and vomiting. Negative for blood in stool.  Genitourinary: Negative for dysuria, pelvic pain, vaginal bleeding and vaginal discharge.  Musculoskeletal: Negative for myalgias.  Skin: Negative for rash.  Neurological: Negative for headaches.     Physical Exam Updated Vital Signs BP (!) 145/104 (BP Location: Right Arm)   Pulse (!) 116   Temp 98.1 F (36.7 C) (Oral)   Resp 20   Ht 5' 1" (1.549 m)   Wt 97.5 kg   SpO2 96%   BMI 40.62 kg/m   Physical Exam Vitals signs and nursing note reviewed.  Constitutional:      Appearance: She is well-developed.  HENT:     Head: Normocephalic and atraumatic.  Eyes:     General:        Right eye: No discharge.        Left eye: No discharge.     Conjunctiva/sclera: Conjunctivae normal.  Neck:     Musculoskeletal: Normal range of motion and neck supple.  Cardiovascular:     Rate and Rhythm: Regular rhythm. Tachycardia present.     Heart sounds: Normal heart sounds.  Pulmonary:     Effort: Pulmonary effort is normal.     Breath sounds: Normal breath sounds.  Abdominal:     Palpations: Abdomen is soft.     Tenderness: There is abdominal tenderness (Moderate tenderness to deep palpation) in the right lower quadrant,  suprapubic area and left lower quadrant. There is guarding. There is no rebound.     Hernia: No hernia is present.  Skin:    General: Skin is warm and dry.  Neurological:     Mental Status: She is alert.      ED Treatments / Results  Labs (all labs ordered  are listed, but only abnormal results are displayed) Labs Reviewed  CBC WITH DIFFERENTIAL/PLATELET - Abnormal; Notable for the following components:      Result Value   RBC 5.31 (*)    Monocytes Absolute 1.1 (*)    All other components within normal limits  COMPREHENSIVE METABOLIC PANEL - Abnormal; Notable for the following components:   Glucose, Bld 114 (*)    BUN 22 (*)    Creatinine, Ser 1.08 (*)    Calcium 8.8 (*)    Total Protein 8.3 (*)    All other components within normal limits  URINALYSIS, ROUTINE W REFLEX MICROSCOPIC - Abnormal; Notable for the following components:   Color, Urine AMBER (*)    APPearance CLOUDY (*)    Specific Gravity, Urine 1.033 (*)    Hgb urine dipstick SMALL (*)    Bilirubin Urine SMALL (*)    Ketones, ur 20 (*)    Protein, ur 100 (*)    Leukocytes, UA TRACE (*)    Bacteria, UA FEW (*)    All other components within normal limits  PREGNANCY, URINE    EKG None  Radiology Ct Abdomen Pelvis W Contrast  Result Date: 02/07/2018 CLINICAL DATA:  Low abdominal and back pain for 3 days. Vomiting and diarrhea over the last day. History of diverticulitis. EXAM: CT ABDOMEN AND PELVIS WITH CONTRAST TECHNIQUE: Multidetector CT imaging of the abdomen and pelvis was performed using the standard protocol following bolus administration of intravenous contrast. CONTRAST:  170m ISOVUE-300 IOPAMIDOL (ISOVUE-300) INJECTION 61% COMPARISON:  Abdominopelvic CT 07/18/2017. Pelvic ultrasound 07/18/2017. FINDINGS: Patient vomited during contrast infusion, resulting in approximately 3 minutes of delay before imaging started. Lower chest: Clear lung bases. There is no significant pleural or pericardial effusion.  There is a small hiatal hernia. Hepatobiliary: The liver is normal in density without suspicious focal abnormality. Tiny low-density lesion posteriorly in the right lobe on image 23/2 is stable. The gallbladder appears normal. No evidence of biliary dilatation. Pancreas: Unremarkable. No pancreatic ductal dilatation or surrounding inflammatory changes. Spleen: Normal in size without focal abnormality. Adrenals/Urinary Tract: Both adrenal glands appear normal. The kidneys, ureters and bladder appear normal. Stomach/Bowel: As above, small hiatal hernia. The stomach, small bowel and proximal colon otherwise appear normal. By report, the appendix is surgically absent. There are scattered diverticula throughout the colon. 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 Communication with the sigmoid colon lumen is uncertain as no enteric contrast was administered for this study. There is no extraluminal fluid collection or free air. Vascular/Lymphatic: There are no enlarged abdominal or pelvic lymph nodes. No significant vascular findings identified. Contrast enhancement of the vasculature is limited due to the patient's vomiting. Reproductive: The uterus and ovaries appear normal. No adnexal mass. Other: Stable umbilical hernia containing only fat. Musculoskeletal: No acute or significant osseous findings. Stable chronic L1 compression deformity. IMPRESSION: 1. Findings are consistent with mild sigmoid colon diverticulitis. There is an adjacent large fluid collection consistent with a giant diverticulum or contain focal perforation. No evidence of bowel obstruction, free air or drainable fluid collection. 2. Administration of enteric contrast on follow-up recommended to assess for communication between the giant diverticulum and the sigmoid colon lumen. 3. Small hiatal hernia.  Small umbilical hernia  containing only fat. Electronically Signed   By: WRichardean SaleM.D.   On: 02/07/2018 12:39    Procedures Procedures (including critical care time)  Medications Ordered in ED Medications  iopamidol (ISOVUE-300) 61 % injection (has no administration in time range)  sodium chloride (PF) 0.9 % injection (has no administration in time range)  sodium chloride 0.9 % bolus 1,000 mL (1,000 mLs Intravenous Bolus 02/07/18 1059)  HYDROmorphone (DILAUDID) injection 0.5 mg (0.5 mg Intravenous Given 02/07/18 1058)  ondansetron (ZOFRAN) injection 4 mg (4 mg Intravenous Given 02/07/18 1058)  iopamidol (ISOVUE-300) 61 % injection 100 mL (100 mLs Intravenous Contrast Given 02/07/18 1206)     Initial Impression / Assessment and Plan / ED Course  I have reviewed the triage vital signs and the nursing notes.  Pertinent labs & imaging results that were available during my care of the patient were reviewed by me and considered in my medical decision making (see chart for details).     Patient seen and examined. Work-up initiated. Medications ordered.  Will obtain lab work and CT scan.  Need to rule out small bowel obstruction versus diverticulitis versus other infectious etiology.  Vital signs reviewed and are as follows: BP (!) 145/104 (BP Location: Right Arm)   Pulse (!) 116   Temp 98.1 F (36.7 C) (Oral)   Resp 20   Ht 5' 1" (1.549 m)   Wt 97.5 kg   SpO2 96%   BMI 40.62 kg/m   2:28 PM CT demonstrates signs of sigmoid diverticulitis.  Question giant diverticulum versus contained perforation.  Discussed results with Dr. Venora Maples.  Will need help with delineation to determine course of treatment.  Spoke with surgery PA.  They are going to review the CT imaging with interventional radiologist.  Patient updated.  She remains focally tender in the suprapubic area with guarding with deep palpation.  3:19 PM Surgery PA spoke with Dr. Annamaria Boots and Dr. Lucia Gaskins per her report. If we feel patient is non-toxic --  home with abx is reasonable.  Patient with normal Kaufhold blood cell count, no fever.  Symptoms are relatively controlled on exam.  She is drinking Coke without vomiting.  Feel comfortable with discharge home at this time.  Patient is in agreement.  Patient will be started on Augmentin.  She is given rx for pain.  Encouraged PCP follow-up in 3 days for recheck.  PCP can decide if they would like her to be rechecked by GI or surgery in the future.  We discussed strict return instructions. The patient was urged to return to the Emergency Department immediately with worsening of current symptoms, worsening abdominal pain, persistent vomiting, blood noted in stools, fever, or any other concerns. The patient verbalized understanding.   Patient counseled on use of narcotic pain medications. Counseled not to combine these medications with others containing tylenol. Urged not to drink alcohol, drive, or perform any other activities that requires focus while taking these medications. The patient verbalizes understanding and agrees with the plan.   Final Clinical Impressions(s) / ED Diagnoses   Final diagnoses:  Sigmoid diverticulitis   Patient with lower abdominal pain, CT consistent with recurrent sigmoid diverticulitis.  Question of contained perforation as above.  Reviewed with additional consultants as above.  Patient appears nontoxic.  She has not had a fever.  Pain is controlled.  Normal Jeanmarie blood cell count.  Overall low suspicion for perforation or abscess and suspect that the CT finding represents a giant diverticulum.  Patient seems reliable to return if her symptoms worsen.  Will be started on Augmentin and given Percocet for pain.  Strongly encourage PCP follow-up for recheck.  ED Discharge  Orders         Ordered    amoxicillin-clavulanate (AUGMENTIN) 875-125 MG tablet  Every 8 hours     02/07/18 1512    oxyCODONE-acetaminophen (PERCOCET/ROXICET) 5-325 MG tablet  Every 6 hours PRN     02/07/18  1512           Carlisle Cater, PA-C 02/07/18 [place], Kevin, MD 02/07/18 [phone]

## 2018-03-25 ENCOUNTER — Encounter: Payer: Self-pay | Admitting: Internal Medicine

## 2018-11-30 ENCOUNTER — Ambulatory Visit (INDEPENDENT_AMBULATORY_CARE_PROVIDER_SITE_OTHER): Payer: PRIVATE HEALTH INSURANCE | Admitting: Critical Care Medicine

## 2018-11-30 ENCOUNTER — Other Ambulatory Visit: Payer: Self-pay

## 2018-11-30 ENCOUNTER — Encounter: Payer: Self-pay | Admitting: Critical Care Medicine

## 2018-11-30 VITALS — BP 144/104 | HR 81 | Temp 97.6°F | Ht 61.0 in | Wt 211.2 lb

## 2018-11-30 DIAGNOSIS — J4540 Moderate persistent asthma, uncomplicated: Secondary | ICD-10-CM

## 2018-11-30 DIAGNOSIS — J309 Allergic rhinitis, unspecified: Secondary | ICD-10-CM | POA: Diagnosis not present

## 2018-11-30 DIAGNOSIS — R059 Cough, unspecified: Secondary | ICD-10-CM

## 2018-11-30 DIAGNOSIS — R05 Cough: Secondary | ICD-10-CM

## 2018-11-30 MED ORDER — BUDESONIDE-FORMOTEROL FUMARATE 160-4.5 MCG/ACT IN AERO: 2.0000 | INHALATION_SPRAY | Freq: Two times a day (BID) | RESPIRATORY_TRACT | 11 refills | Status: DC

## 2018-11-30 MED ORDER — ZYRTEC ALLERGY 10 MG PO TBDP: 10.0000 mg | ORAL_TABLET | Freq: Every day | ORAL | 11 refills | Status: DC

## 2018-11-30 NOTE — Progress Notes (Signed)
Synopsis: Referred in November 2020 for chronic cough by Benito Mccreedy, MD  Subjective:   PATIENT ID: Briana Wade GENDER: female DOB: Mar 12, 1970, MRN: [id number]  Chief Complaint  Patient presents with  . Pulmonary Consult    Briana Wade is a 48 year old woman who presents for evaluation of chronic cough for the last year.  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  Her cough improves somewhat when she is away from work and worsens when she is there.  It never fully resolves.  Her cough limits her ability to sleep.  She has been prescribed Symbicort and another inhaler that she does not member the name of, Tessalon, and Tussionex.  She has taken the Symbicort infrequently, and overall has not noticed a significant improvement in her symptoms with any of these medications.  Her cough is dry, worsens with eating, and is only improved slightly by taking sips of water.  She endorses wheezing and shortness of breath frequently.  She denies a sore throat, postnasal drip, rhinorrhea, sneezing, watery eyes, heartburn.  She thinks that her mask is worsening her symptoms rather than improving.  She takes losartan for her hypertension, and has not been on an ACE inhibitor recently.  She has multiple cousins who had asthma in addition to her son.  She has no personal history of asthma or allergies.  She has a history of GERD and was previously on a PPI, but she has stopped this and no longer has symptoms (per Sept 2020 PCP note, this is still prescribed.  She has never smoked or vaped.  She refuses to have flu shots after having 1 3 to 4 years ago and feeling as though she has been sick ever since.    Past Medical History:  Diagnosis Date  . Abdominal pain, unspecified site   . Allergy   . Anemia   . Asthma   . Chlamydia   . Diverticulitis   . Esophageal reflux   . Hiatal hernia   . Hypertension   . IBS (irritable bowel syndrome)   . Insomnia   . Lactose  intolerance   . Migraine equivalent 08/2014   minor right arm weakness in [facility]. neg CVA on imaging.   . Other constipation   . Personal history of colonic polyps 09/12/2010   tubular adenoma  . Trichimoniasis   . Tubular adenoma of colon   . Umbilical hernia   . Uterine fibroid   . Uterine leiomyoma      Family History  Problem Relation Age of Onset  . Hypertension Mother   . Cancer Mother        not sure of type  . Asthma Cousin   . Asthma Son   . Colon cancer Neg Hx   . Esophageal cancer Neg Hx   . Rectal cancer Neg Hx   . Stomach cancer Neg Hx      Past Surgical History:  Procedure Laterality Date  . APPENDECTOMY    . COLONOSCOPY    . DILATION AND CURETTAGE OF UTERUS     x2 for EAB  . HYSTEROSCOPY WITH NOVASURE N/A 12/17/2015   Procedure: HYSTEROSCOPY WITH NOVASURE;  Surgeon: Aletha Halim, MD;  Location: [facility];  Service: Gynecology;  Laterality: N/A;  . TUBAL LIGATION      Social History   Socioeconomic History  . Marital status: Single    Spouse name: Not on file  . Number of children: 3  .  Years of education: Not on file  . Highest education level: Not on file  Occupational History  . Occupation: Unemployed  Social Needs  . Financial resource strain: Not on file  . Food insecurity    Worry: Not on file    Inability: Not on file  . Transportation needs    Medical: Not on file    Non-medical: Not on file  Tobacco Use  . Smoking status: Never Smoker  . Smokeless tobacco: Never Used  Substance and Sexual Activity  . Alcohol use: No  . Drug use: No  . Sexual activity: Yes    Birth control/protection: Surgical  Lifestyle  . Physical activity    Days per week: Not on file    Minutes per session: Not on file  . Stress: Not on file  Relationships  . Social Herbalist on phone: Not on file    Gets together: Not on file    Attends religious service: Not on file    Active member of club or organization: Not on file    Attends meetings of  clubs or organizations: Not on file    Relationship status: Not on file  . Intimate partner violence    Fear of current or ex partner: Not on file    Emotionally abused: Not on file    Physically abused: Not on file    Forced sexual activity: Not on file  Other Topics Concern  . Not on file  Social History Narrative  . Not on file     Allergies  Allergen Reactions  . Lactose Intolerance (Gi)      Immunization History  Administered Date(s) Administered  . Pneumococcal Polysaccharide-23 09/22/2013    Outpatient Medications Prior to Visit  Medication Sig Dispense Refill  . albuterol (VENTOLIN HFA) 108 (90 Base) MCG/ACT inhaler Inhale into the lungs every 6 (six) hours as needed for wheezing or shortness of breath.    . clonazePAM (KLONOPIN) 1 MG tablet Take 1 mg by mouth 2 (two) times daily as needed for anxiety.   1  . ibuprofen (ADVIL,MOTRIN) 200 MG tablet Take 200 mg by mouth every 6 (six) hours as needed for moderate pain.    Marland Kitchen losartan (COZAAR) 50 MG tablet Take 50 mg by mouth daily.  1  . amoxicillin-clavulanate (AUGMENTIN) 875-125 MG tablet Take 1 tablet by mouth every 8 (eight) hours. 21 tablet 0  . oxyCODONE-acetaminophen (PERCOCET/ROXICET) 5-325 MG tablet Take 1 tablet by mouth every 6 (six) hours as needed for severe pain. 10 tablet 0  . traMADol (ULTRAM) 50 MG tablet Take 50 mg by mouth every 6 (six) hours as needed for moderate pain.     No facility-administered medications prior to visit.     Review of Systems  Constitutional: Positive for malaise/fatigue. Negative for chills, fever and weight loss.  HENT: Negative for congestion and sore throat.   Eyes: Negative.   Respiratory: Positive for cough and wheezing. Negative for hemoptysis, sputum production and shortness of breath.   Cardiovascular: Negative for chest pain and leg swelling.  Gastrointestinal: Negative for blood in stool, heartburn, nausea and vomiting.  Genitourinary: Negative.   Musculoskeletal:  Negative for joint pain and myalgias.  Skin: Negative for rash.  Neurological: Negative for dizziness and weakness.  Endo/Heme/Allergies: Negative for environmental allergies.  Psychiatric/Behavioral: Negative.      Objective:   Vitals:   11/30/18 1628  BP: (!) 144/104  Pulse: 81  Temp: 97.6 F (36.4 C)  TempSrc:  Oral  SpO2: 98%  Weight: 211 lb 3.2 oz (95.8 kg)  Height: 5\' 1"  (1.549 m)   98% on   RA BMI Readings from Last 3 Encounters:  11/30/18 39.91 kg/m  02/07/18 40.62 kg/m  09/29/17 40.09 kg/m   Wt Readings from Last 3 Encounters:  11/30/18 211 lb 3.2 oz (95.8 kg)  02/07/18 215 lb (97.5 kg)  09/29/17 212 lb 3.2 oz (96.3 kg)    Physical Exam Vitals signs reviewed.  Constitutional:      General: She is not in acute distress.    Appearance: Normal appearance. She is obese. She is not ill-appearing or diaphoretic.  HENT:     Head: Normocephalic and atraumatic.     Nose:     Comments: Deferred due to masking requirement.    Mouth/Throat:     Comments: Deferred due to masking requirement. Eyes:     General: No scleral icterus. Neck:     Musculoskeletal: Neck supple.  Cardiovascular:     Rate and Rhythm: Normal rate and regular rhythm.     Heart sounds: No murmur.  Pulmonary:     Comments: Breathing comfortably on room air.  No conversational dyspnea.  No witnessed coughing or coughing induced by deep inspiration.  Clear to auscultation bilaterally. Abdominal:     General: There is no distension.     Palpations: Abdomen is soft.     Tenderness: There is no abdominal tenderness.  Musculoskeletal:        General: No swelling or deformity.  Lymphadenopathy:     Cervical: No cervical adenopathy.  Skin:    General: Skin is warm and dry.     Findings: No rash.  Neurological:     General: No focal deficit present.     Mental Status: She is alert.     Motor: No weakness.     Coordination: Coordination normal.  Psychiatric:        Mood and Affect: Mood  normal.        Behavior: Behavior normal.      CBC    Component Value Date/Time   WBC 7.3 02/07/2018 1101   RBC 5.31 (H) 02/07/2018 1101   HGB 14.5 02/07/2018 1101   HGB 12.0 06/07/2017 1738   HCT 45.8 02/07/2018 1101   HCT 37.6 06/07/2017 1738   PLT 310 02/07/2018 1101   PLT 294 06/07/2017 1738   MCV 86.3 02/07/2018 1101   MCV 85 06/07/2017 1738   MCH 27.3 02/07/2018 1101   MCHC 31.7 02/07/2018 1101   RDW 13.8 02/07/2018 1101   RDW 13.4 06/07/2017 1738   LYMPHSABS 0.9 02/07/2018 1101   MONOABS 1.1 (H) 02/07/2018 1101   EOSABS 0.0 02/07/2018 1101   BASOSABS 0.0 02/07/2018 1101     Chest Imaging- films reviewed: CT abdomen pelvis 02/07/2018, lung films reviewed-hiatal hernia, thickened airways, but they taper normally.  No masses or opacities.  CXR, 2 view 10/08/2016-RML opacity  Pulmonary Functions Testing Results: No flowsheet data found.   Echocardiogram: Per PCP note from 11/07/2018, reported as normal     Assessment & Plan:     ICD-10-CM   1. Moderate persistent asthma, unspecified whether complicated  123456 Cetirizine HCl (ZYRTEC ALLERGY) 10 MG TBDP    budesonide-formoterol (SYMBICORT) 160-4.5 MCG/ACT inhaler    Pulmonary function test  2. Allergic rhinitis, unspecified seasonality, unspecified trigger  J30.9 Cetirizine HCl (ZYRTEC ALLERGY) 10 MG TBDP    budesonide-formoterol (SYMBICORT) 160-4.5 MCG/ACT inhaler    Pulmonary function test  3.  Cough  R05 Cetirizine HCl (ZYRTEC ALLERGY) 10 MG TBDP    budesonide-formoterol (SYMBICORT) 160-4.5 MCG/ACT inhaler    Pulmonary function test    Chronic cough, concern for moderate persistent asthma due to occupational dust exposure. -Start Symbicort twice daily.  Inhaler technique reviewed.  Reinforced the importance of rinsing her mouth after using this -Albuterol as needed -Recommended that she consider looking for other jobs or ways that she can avoid being in this warehouse at work. -Start Zyrtec daily  -PFTs-discussed that she will require Covid testing with subsequent quarantine prior to this test.  Allergic rhinosinusitis -Zyrtec daily  GERD with hiatal hernia -Very likely she is going to need to restart her PPI and continue indefinitely  RTC in 4 to 6 weeks after PFTs.   Current Outpatient Medications:  .  albuterol (VENTOLIN HFA) 108 (90 Base) MCG/ACT inhaler, Inhale into the lungs every 6 (six) hours as needed for wheezing or shortness of breath., Disp: , Rfl:  .  clonazePAM (KLONOPIN) 1 MG tablet, Take 1 mg by mouth 2 (two) times daily as needed for anxiety. , Disp: , Rfl: 1 .  ibuprofen (ADVIL,MOTRIN) 200 MG tablet, Take 200 mg by mouth every 6 (six) hours as needed for moderate pain., Disp: , Rfl:  .  losartan (COZAAR) 50 MG tablet, Take 50 mg by mouth daily., Disp: , Rfl: 1 .  budesonide-formoterol (SYMBICORT) 160-4.5 MCG/ACT inhaler, Inhale 2 puffs into the lungs 2 (two) times daily., Disp: 1 Inhaler, Rfl: 11 .  Cetirizine HCl (ZYRTEC ALLERGY) 10 MG TBDP, Take 10 mg by mouth daily., Disp: 30 tablet, Rfl: 11   Julian Hy, DO [facility] 11/30/2018 5:53 PM

## 2018-11-30 NOTE — Patient Instructions (Signed)
Thank you for visiting Dr. Carlis Abbott at [facility]. We recommend the following: Orders Placed This Encounter  Procedures  . Pulmonary function test   Orders Placed This Encounter  Procedures  . Pulmonary function test    Standing Status:   Future    Standing Expiration Date:   11/30/2019    Order Specific Question:   Where should this test be performed?    Answer:   [facility]    Order Specific Question:   Full PFT: includes the following: basic spirometry, spirometry pre & post bronchodilator, diffusion capacity (DLCO), lung volumes    Answer:   Full PFT    Meds ordered this encounter  Medications  . Cetirizine HCl (ZYRTEC ALLERGY) 10 MG TBDP    Sig: Take 10 mg by mouth daily.    Dispense:  30 tablet    Refill:  11  . budesonide-formoterol (SYMBICORT) 160-4.5 MCG/ACT inhaler    Sig: Inhale 2 puffs into the lungs 2 (two) times daily.    Dispense:  1 Inhaler    Refill:  11    Return in about 1 month (around 12/30/2018).    Please do your part to reduce the spread of COVID-19.

## 2018-11-30 NOTE — Progress Notes (Signed)
   Subjective:    Patient ID: Briana Wade, female    DOB: 10/29/1970, 48 y.o.   MRN: [id number]  HPI    Review of Systems  Constitutional: Negative for fever and unexpected weight change.  HENT: Negative for congestion, dental problem, ear pain, nosebleeds, postnasal drip, rhinorrhea, sinus pressure, sneezing, sore throat and trouble swallowing.   Eyes: Negative for redness and itching.  Respiratory: Positive for cough and shortness of breath. Negative for chest tightness and wheezing.   Cardiovascular: Negative for palpitations and leg swelling.  Gastrointestinal: Negative for nausea and vomiting.  Genitourinary: Negative for dysuria.  Musculoskeletal: Negative for joint swelling.  Skin: Negative for rash.  Neurological: Positive for headaches.  Hematological: Does not bruise/bleed easily.  Psychiatric/Behavioral: Negative for dysphoric mood. The patient is not nervous/anxious.        Objective:   Physical Exam        Assessment & Plan:

## 2018-12-28 ENCOUNTER — Telehealth: Payer: Self-pay | Admitting: Critical Care Medicine

## 2018-12-28 NOTE — Telephone Encounter (Signed)
Note was faxed to the number provided

## 2019-01-09 ENCOUNTER — Telehealth: Payer: Self-pay | Admitting: Critical Care Medicine

## 2019-01-09 NOTE — Telephone Encounter (Signed)
Spoke with pt, this was taken care of already. Nothing further is needed.

## 2019-01-10 ENCOUNTER — Other Ambulatory Visit (HOSPITAL_COMMUNITY)
Admission: RE | Admit: 2019-01-10 | Discharge: 2019-01-10 | Disposition: A | Discharge status: Home / Self Care | Payer: PRIVATE HEALTH INSURANCE | Source: Ambulatory Visit | Attending: Critical Care Medicine | Admitting: Critical Care Medicine

## 2019-01-10 DIAGNOSIS — Z01812 Encounter for preprocedural laboratory examination: Secondary | ICD-10-CM | POA: Insufficient documentation

## 2019-01-10 DIAGNOSIS — Z20828 Contact with and (suspected) exposure to other viral communicable diseases: Secondary | ICD-10-CM | POA: Diagnosis not present

## 2019-01-11 LAB — NOVEL CORONAVIRUS, NAA (HOSP ORDER, SEND-OUT TO REF LAB; TAT 18-24 HRS): SARS-CoV-2, NAA: NOT DETECTED

## 2019-01-13 ENCOUNTER — Other Ambulatory Visit: Payer: Self-pay

## 2019-01-13 ENCOUNTER — Ambulatory Visit (INDEPENDENT_AMBULATORY_CARE_PROVIDER_SITE_OTHER): Payer: PRIVATE HEALTH INSURANCE | Admitting: Critical Care Medicine

## 2019-01-13 DIAGNOSIS — J4540 Moderate persistent asthma, uncomplicated: Secondary | ICD-10-CM | POA: Diagnosis not present

## 2019-01-13 DIAGNOSIS — J309 Allergic rhinitis, unspecified: Secondary | ICD-10-CM

## 2019-01-13 DIAGNOSIS — R059 Cough, unspecified: Secondary | ICD-10-CM

## 2019-01-13 DIAGNOSIS — R05 Cough: Secondary | ICD-10-CM

## 2019-01-13 LAB — PULMONARY FUNCTION TEST
DL/VA % pred: 136 %
DL/VA: 6.02 ml/min/mmHg/L
DLCO unc % pred: 109 %
DLCO unc: 20.81 ml/min/mmHg
FEF 25-75 Post: 1.01 L/sec
FEF 25-75 Pre: 1.23 L/sec
FEF2575-%Change-Post: -17 %
FEF2575-%Pred-Post: 44 %
FEF2575-%Pred-Pre: 53 %
FEV1-%Change-Post: -3 %
FEV1-%Pred-Post: 79 %
FEV1-%Pred-Pre: 82 %
FEV1-Post: 1.65 L
FEV1-Pre: 1.71 L
FEV1FVC-%Change-Post: 6 %
FEV1FVC-%Pred-Pre: 89 %
FEV6-%Change-Post: -9 %
FEV6-%Pred-Post: 84 %
FEV6-%Pred-Pre: 92 %
FEV6-Post: 2.12 L
FEV6-Pre: 2.33 L
FEV6FVC-%Pred-Post: 103 %
FEV6FVC-%Pred-Pre: 103 %
FVC-%Change-Post: -9 %
FVC-%Pred-Post: 81 %
FVC-%Pred-Pre: 90 %
FVC-Post: 2.12 L
FVC-Pre: 2.33 L
Post FEV1/FVC ratio: 78 %
Post FEV6/FVC ratio: 100 %
Pre FEV1/FVC ratio: 73 %
Pre FEV6/FVC Ratio: 100 %

## 2019-01-13 NOTE — Progress Notes (Signed)
PFT completed today.  

## 2020-01-20 ENCOUNTER — Other Ambulatory Visit: Payer: Self-pay

## 2020-01-20 ENCOUNTER — Encounter (HOSPITAL_COMMUNITY): Payer: Self-pay

## 2020-01-20 ENCOUNTER — Emergency Department (HOSPITAL_COMMUNITY): Payer: HRSA Program

## 2020-01-20 DIAGNOSIS — R202 Paresthesia of skin: Secondary | ICD-10-CM | POA: Diagnosis not present

## 2020-01-20 DIAGNOSIS — U071 COVID-19: Secondary | ICD-10-CM | POA: Insufficient documentation

## 2020-01-20 DIAGNOSIS — Z79899 Other long term (current) drug therapy: Secondary | ICD-10-CM | POA: Insufficient documentation

## 2020-01-20 DIAGNOSIS — R0789 Other chest pain: Secondary | ICD-10-CM | POA: Diagnosis present

## 2020-01-20 DIAGNOSIS — I10 Essential (primary) hypertension: Secondary | ICD-10-CM | POA: Insufficient documentation

## 2020-01-20 DIAGNOSIS — Z7951 Long term (current) use of inhaled steroids: Secondary | ICD-10-CM | POA: Insufficient documentation

## 2020-01-20 DIAGNOSIS — J45909 Unspecified asthma, uncomplicated: Secondary | ICD-10-CM | POA: Diagnosis not present

## 2020-01-20 LAB — BASIC METABOLIC PANEL
Anion gap: 12 (ref 5–15)
BUN: 10 mg/dL (ref 6–20)
CO2: 21 mmol/L — ABNORMAL LOW (ref 22–32)
Calcium: 9.1 mg/dL (ref 8.9–10.3)
Chloride: 101 mmol/L (ref 98–111)
Creatinine, Ser: 0.78 mg/dL (ref 0.44–1.00)
GFR, Estimated: >60 mL/min (ref >60)
Glucose, Bld: 118 mg/dL — ABNORMAL HIGH (ref 70–99)
Potassium: 3.7 mmol/L (ref 3.5–5.1)
Sodium: 134 mmol/L — ABNORMAL LOW (ref 135–145)

## 2020-01-20 LAB — CBC
HCT: 38.5 % (ref 36.0–46.0)
Hemoglobin: 12.2 g/dL (ref 12.0–15.0)
MCH: 28 pg (ref 26.0–34.0)
MCHC: 31.7 g/dL (ref 30.0–36.0)
MCV: 88.5 fL (ref 80.0–100.0)
Platelets: 256 10*3/uL (ref 150–400)
RBC: 4.35 MIL/uL (ref 3.87–5.11)
RDW: 13.5 % (ref 11.5–15.5)
WBC: 5.6 10*3/uL (ref 4.0–10.5)
nRBC: 0 % (ref 0.0–0.2)

## 2020-01-20 LAB — TROPONIN I (HIGH SENSITIVITY): Troponin I (High Sensitivity): <2 ng/L (ref <18)

## 2020-01-20 NOTE — ED Triage Notes (Signed)
Patient arrived with complaints of generalized intermitted chest pain that started at 2pm today. Reporting nausea, vomiting, and a headache. Reports taking ibuprofen at 5pm.

## 2020-01-20 NOTE — ED Notes (Signed)
Unsuccessful blood draw attempt. 

## 2020-01-20 NOTE — ED Notes (Signed)
Patient eating candy in triage. Advised to stay NPO until seen by a provider.

## 2020-01-20 NOTE — ED Notes (Signed)
Attempted lab draw but unsuccessful. RN, Earnest Bailey made aware.

## 2020-01-21 ENCOUNTER — Emergency Department (HOSPITAL_COMMUNITY): Payer: HRSA Program

## 2020-01-21 ENCOUNTER — Emergency Department (HOSPITAL_COMMUNITY)
Admission: EM | Admit: 2020-01-21 | Discharge: 2020-01-21 | Disposition: A | Discharge status: Home / Self Care | Payer: HRSA Program | Attending: Emergency Medicine | Admitting: Emergency Medicine

## 2020-01-21 DIAGNOSIS — U071 COVID-19: Secondary | ICD-10-CM

## 2020-01-21 LAB — RESP PANEL BY RT-PCR (FLU A&B, COVID) ARPGX2
Influenza A by PCR: NEGATIVE
Influenza B by PCR: NEGATIVE
SARS Coronavirus 2 by RT PCR: POSITIVE — AB

## 2020-01-21 LAB — URINALYSIS, ROUTINE W REFLEX MICROSCOPIC
Bilirubin Urine: NEGATIVE
Glucose, UA: NEGATIVE mg/dL
Hgb urine dipstick: NEGATIVE
Ketones, ur: NEGATIVE mg/dL
Leukocytes,Ua: NEGATIVE
Nitrite: NEGATIVE
Protein, ur: NEGATIVE mg/dL
Specific Gravity, Urine: 1.009 (ref 1.005–1.030)
pH: 7 (ref 5.0–8.0)

## 2020-01-21 LAB — PREGNANCY, URINE: Preg Test, Ur: NEGATIVE

## 2020-01-21 LAB — TROPONIN I (HIGH SENSITIVITY): Troponin I (High Sensitivity): 3 ng/L (ref <18)

## 2020-01-21 MED ORDER — METOCLOPRAMIDE HCL 5 MG/ML IJ SOLN
10.0000 mg | Freq: Once | INTRAMUSCULAR | Status: AC
  Administered 2020-01-21: 04:00:00 10 mg via INTRAVENOUS
  Filled 2020-01-21: qty 2

## 2020-01-21 MED ORDER — ONDANSETRON HCL 4 MG/2ML IJ SOLN: 4.0000 mg | Freq: Once | INTRAMUSCULAR | Status: DC

## 2020-01-21 MED ORDER — SODIUM CHLORIDE 0.9 % IV BOLUS
1000.0000 mL | Freq: Once | INTRAVENOUS | Status: AC
  Administered 2020-01-21: 04:00:00 1000 mL via INTRAVENOUS

## 2020-01-21 MED ORDER — ONDANSETRON 8 MG PO TBDP: 8.0000 mg | ORAL_TABLET | Freq: Three times a day (TID) | ORAL | 0 refills | Status: DC | PRN

## 2020-01-21 NOTE — ED Provider Notes (Signed)
Stable  [facility] Provider Note: Lowella Dell, MD, FACEP  CSN: [id number] MRN: [id number] ARRIVAL: 01/20/20 at 2001 ROOM: WA18/WA18   CHIEF COMPLAINT  Chest Pain   HISTORY OF PRESENT ILLNESS  01/21/20 2:45 AM Briana Wade is a 49 y.o. female who began having chest pain about 2 PM yesterday afternoon.  It was severe enough to awaken her from sleep and she rated it as a 9 out of 10.  The pain was located left of the upper sternum.  The pain was sharp and intermittent.  It lasted several minutes at a time.  She had some mild shortness of breath associated with it.  The pain is also worse with palpation of that area.  Along with the chest pain she had intermittent paresthesias of the right side of her body, primarily the lateral aspect of her right leg.  Nothing makes this better or worse.  She also had a multiple episodes of vomiting and states that she lost all of her stomach contents due to forceful vomiting.  The vomiting has ceased.  She has not had diarrhea.  She did take ibuprofen at 5 PM but continues to have a headache.   Past Medical History:  Diagnosis Date  . Abdominal pain, unspecified site   . Allergy   . Anemia   . Asthma   . Chlamydia   . Diverticulitis   . Esophageal reflux   . Hiatal hernia   . Hypertension   . IBS (irritable bowel syndrome)   . Insomnia   . Lactose intolerance   . Migraine equivalent 08/2014   minor right arm weakness in [facility]. neg CVA on imaging.   . Other constipation   . Personal history of colonic polyps 09/12/2010   tubular adenoma  . Trichimoniasis   . Tubular adenoma of colon   . Umbilical hernia   . Uterine fibroid   . Uterine leiomyoma     Past Surgical History:  Procedure Laterality Date  . APPENDECTOMY    . COLONOSCOPY    . DILATION AND CURETTAGE OF UTERUS     x2 for EAB  . HYSTEROSCOPY WITH NOVASURE N/A 12/17/2015   Procedure: HYSTEROSCOPY WITH NOVASURE;  Surgeon: Orangevale Bing, MD;  Location: [facility];   Service: Gynecology;  Laterality: N/A;  . TUBAL LIGATION      Family History  Problem Relation Age of Onset  . Hypertension Mother   . Cancer Mother        not sure of type  . Asthma Cousin   . Asthma Son   . Colon cancer Neg Hx   . Esophageal cancer Neg Hx   . Rectal cancer Neg Hx   . Stomach cancer Neg Hx     Social History   Tobacco Use  . Smoking status: Never Smoker  . Smokeless tobacco: Never Used  Vaping Use  . Vaping Use: Never used  Substance Use Topics  . Alcohol use: No  . Drug use: No    Prior to Admission medications   Medication Sig Start Date End Date Taking? Authorizing Provider  albuterol (VENTOLIN HFA) 108 (90 Base) MCG/ACT inhaler Inhale into the lungs every 6 (six) hours as needed for wheezing or shortness of breath.    [provider]  budesonide-formoterol (SYMBICORT) 160-4.5 MCG/ACT inhaler Inhale 2 puffs into the lungs 2 (two) times daily. 11/30/18   Steffanie Dunn, DO  Cetirizine HCl (ZYRTEC ALLERGY) 10 MG TBDP Take 10 mg by mouth daily. 11/30/18  Noemi Chapel P, DO  clonazePAM (KLONOPIN) 1 MG tablet Take 1 mg by mouth 2 (two) times daily as needed for anxiety.  06/08/17   [provider]  ibuprofen (ADVIL,MOTRIN) 200 MG tablet Take 200 mg by mouth every 6 (six) hours as needed for moderate pain.    [provider]  losartan (COZAAR) 50 MG tablet Take 50 mg by mouth daily.    [provider]    Allergies Lactose intolerance (gi)   REVIEW OF SYSTEMS  Negative except as noted here or in the History of Present Illness.   PHYSICAL EXAMINATION  Initial Vital Signs Blood pressure (!) 156/93, pulse 94, temperature 99.5 F (37.5 C), temperature source Oral, resp. rate 16, SpO2 97 %.  Examination General: Well-developed, well-nourished female in no acute distress; appearance consistent with age of record HENT: normocephalic; atraumatic Eyes: pupils equal, round and reactive to light; extraocular muscles intact;  mild bilateral conjunctival injection without exudate Neck: supple Heart: regular rate and rhythm Chest: Left parasternal tenderness that reproduces the pain of the chief complaint Lungs: clear to auscultation bilaterally Abdomen: soft; nondistended; nontender; bowel sounds present Extremities: No deformity; full range of motion; pulses normal Neurologic: Awake, alert and oriented; motor function intact in all extremities and symmetric; no facial droop; sensation intact and symmetric; normal finger-to-nose; no pronator drift Skin: Warm and dry Psychiatric: Normal mood and affect   RESULTS  Summary of this visit's results, reviewed and interpreted by myself:   EKG Interpretation  Date/Time:  Saturday January 20 2020 20:18:20 EST Ventricular Rate:  114 PR Interval:    QRS Duration: 72 QT Interval:  332 QTC Calculation: 458 R Axis:   -6 Text Interpretation: Sinus tachycardia Probable left atrial enlargement Left ventricular hypertrophy Rate is faster Confirmed by Anastazia Creek [phone]) on 01/21/2020 2:46:50 AM      Laboratory Studies: Results for orders placed or performed during the hospital encounter of 01/21/20 (from the past 24 hour(s))  Basic metabolic panel     Status: Abnormal   Collection Time: 01/20/20  8:50 PM  Result Value Ref Range   Sodium 134 (L) 135 - 145 mmol/L   Potassium 3.7 3.5 - 5.1 mmol/L   Chloride 101 98 - 111 mmol/L   CO2 21 (L) 22 - 32 mmol/L   Glucose, Bld 118 (H) 70 - 99 mg/dL   BUN 10 6 - 20 mg/dL   Creatinine, Ser 0.78 0.44 - 1.00 mg/dL   Calcium 9.1 8.9 - 10.3 mg/dL   GFR, Estimated >60 >60 mL/min   Anion gap 12 5 - 15  CBC     Status: None   Collection Time: 01/20/20  8:50 PM  Result Value Ref Range   WBC 5.6 4.0 - 10.5 K/uL   RBC 4.35 3.87 - 5.11 MIL/uL   Hemoglobin 12.2 12.0 - 15.0 g/dL   HCT 38.5 36.0 - 46.0 %   MCV 88.5 80.0 - 100.0 fL   MCH 28.0 26.0 - 34.0 pg   MCHC 31.7 30.0 - 36.0 g/dL   RDW 13.5 11.5 - 15.5 %   Platelets 256  150 - 400 K/uL   nRBC 0.0 0.0 - 0.2 %  Troponin I (High Sensitivity)     Status: None   Collection Time: 01/20/20  8:50 PM  Result Value Ref Range   Troponin I (High Sensitivity) <2 <18 ng/L  Troponin I (High Sensitivity)     Status: None   Collection Time: 01/21/20  3:25 AM  Result Value  Ref Range   Troponin I (High Sensitivity) 3 <18 ng/L  Resp Panel by RT-PCR (Flu A&B, Covid) Nasopharyngeal Swab     Status: Abnormal   Collection Time: 01/21/20  3:25 AM   Specimen: Nasopharyngeal Swab; Nasopharyngeal(NP) swabs in vial transport medium  Result Value Ref Range   SARS Coronavirus 2 by RT PCR POSITIVE (A) NEGATIVE   Influenza A by PCR NEGATIVE NEGATIVE   Influenza B by PCR NEGATIVE NEGATIVE  Urinalysis, Routine w reflex microscopic Urine, Clean Catch     Status: Abnormal   Collection Time: 01/21/20  3:25 AM  Result Value Ref Range   Color, Urine STRAW (A) YELLOW   APPearance CLEAR CLEAR   Specific Gravity, Urine 1.009 1.005 - 1.030   pH 7.0 5.0 - 8.0   Glucose, UA NEGATIVE NEGATIVE mg/dL   Hgb urine dipstick NEGATIVE NEGATIVE   Bilirubin Urine NEGATIVE NEGATIVE   Ketones, ur NEGATIVE NEGATIVE mg/dL   Protein, ur NEGATIVE NEGATIVE mg/dL   Nitrite NEGATIVE NEGATIVE   Leukocytes,Ua NEGATIVE NEGATIVE   Imaging Studies: DG Chest 2 View  Result Date: 01/20/2020 CLINICAL DATA:  49 year old female with chest pain. EXAM: CHEST - 2 VIEW COMPARISON:  Chest radiographs 10/08/2016 and earlier. FINDINGS: Resolved right lower lung opacity seen in 2018. Lung volumes and mediastinal contours remain within normal limits. Visualized tracheal air column is within normal limits. Today both lungs appear clear. No pneumothorax or pleural effusion. Stable visualized osseous structures. Negative visible bowel gas pattern. IMPRESSION: No acute cardiopulmonary abnormality. Electronically Signed   By: Genevie Ann M.D.   On: 01/20/2020 21:11   CT Head Wo Contrast  Result Date: 01/21/2020 CLINICAL DATA:  TIA.   Intermittent chest pain that started at 2 p.m. EXAM: CT HEAD WITHOUT CONTRAST TECHNIQUE: Contiguous axial images were obtained from the base of the skull through the vertex without intravenous contrast. COMPARISON:  08/27/2014 brain MRI FINDINGS: Brain: Areas of subcortical low-density in the bilateral insular and superior frontal regions are attributed to dilated perivascular spaces by comparison brain MRI. There are a few remote Shi matter insults also by 2016 MRI. Partially empty sella, considered incidental in isolation. No evidence of acute infarct, hemorrhage, hydrocephalus, or mass Vascular: No hyperdense vessel or unexpected calcification. Skull: Normal. Negative for fracture or focal lesion. Sinuses/Orbits: No acute finding. IMPRESSION: No acute finding or change from 2016. Electronically Signed   By: Monte Fantasia M.D.   On: 01/21/2020 04:49    ED COURSE and MDM  Nursing notes, initial and subsequent vitals signs, including pulse oximetry, reviewed and interpreted by myself.  Vitals:   01/20/20 2013 01/20/20 2255 01/21/20 0430 01/21/20 0615  BP: (!) 177/117 (!) 156/93 (!) 159/93 (!) 200/113  Pulse: (!) 109 94 95 (!) 110  Resp: 20 16 18 18   Temp: 99.5 F (37.5 C)     TempSrc: Oral     SpO2: 96% 97% 96% 98%   Medications  sodium chloride 0.9 % bolus 1,000 mL (1,000 mLs Intravenous New Bag/Given 01/21/20 0359)  metoCLOPramide (REGLAN) injection 10 mg (10 mg Intravenous Given 01/21/20 0400)   5:55 AM Headache significantly improved after IV fluids and Reglan.  Chest pain resolved and chest is no longer tender.  Paresthesias have nearly resolved.  Blood pressure is improved.  6:08 AM Patient positive for COVID-19.  The pattern of the patient's paresthesias on the lateral aspect of the right leg is not consistent with a CVA and her chest pain is reproducible.  She has had 2  normal troponins and an unremarkable EKG.  She does not meet criteria for admission at this time and we are  currently out of the Regeneron monoclonal antibody infusion.   PROCEDURES  Procedures   ED DIAGNOSES     ICD-10-CM   1. COVID-19 virus infection  U07.1        Margaux Engen, MD 01/25/20 2246

## 2020-01-22 ENCOUNTER — Telehealth: Payer: Self-pay | Admitting: Unknown Physician Specialty

## 2020-01-22 NOTE — Telephone Encounter (Signed)
I connected by phone with Briana Wade on 01/22/2020 at 11:13 AM to discuss the potential use of a new treatment for mild to moderate COVID-19 viral infection in non-hospitalized patients.  This patient is a 49 y.o. female that meets the FDA criteria for Emergency Use Authorization of COVID monoclonal antibody casirivimab/imdevimab, bamlanivimab/etesevimab, or sotrovimab.  Has a (+) direct SARS-CoV-2 viral test result  Has mild or moderate COVID-19   Is NOT hospitalized due to COVID-19  Is within 10 days of symptom onset  Has at least one of the high risk factor(s) for progression to severe COVID-19 and/or hospitalization as defined in EUA.  Specific high risk criteria : BMI > 25   I have spoken and communicated the following to the patient or parent/caregiver regarding COVID monoclonal antibody treatment:  1. FDA has authorized the emergency use for the treatment of mild to moderate COVID-19 in adults and pediatric patients with positive results of direct SARS-CoV-2 viral testing who are 60 years of age and older weighing at least 40 kg, and who are at high risk for progressing to severe COVID-19 and/or hospitalization.  2. The significant known and potential risks and benefits of COVID monoclonal antibody, and the extent to which such potential risks and benefits are unknown.  3. Information on available alternative treatments and the risks and benefits of those alternatives, including clinical trials.  4. Patients treated with COVID monoclonal antibody should continue to self-isolate and use infection control measures (e.g., wear mask, isolate, social distance, avoid sharing personal items, clean and disinfect high touch surfaces, and frequent handwashing) according to CDC guidelines.   5. The patient or parent/caregiver has the option to accept or refuse COVID monoclonal antibody treatment.  After reviewing this information with the patient, the patient has DECLINED offer to  receive the infusion. Gabriel Cirri, NP 01/22/2020 11:13 AM  Sx onset 12/26

## 2020-03-05 ENCOUNTER — Encounter (HOSPITAL_COMMUNITY): Payer: Self-pay

## 2020-03-05 ENCOUNTER — Emergency Department (HOSPITAL_COMMUNITY): Payer: Self-pay

## 2020-03-05 ENCOUNTER — Emergency Department (HOSPITAL_COMMUNITY)
Admission: EM | Admit: 2020-03-05 | Discharge: 2020-03-05 | Disposition: A | Discharge status: Home / Self Care | Payer: Self-pay | Attending: Emergency Medicine | Admitting: Emergency Medicine

## 2020-03-05 ENCOUNTER — Emergency Department (HOSPITAL_BASED_OUTPATIENT_CLINIC_OR_DEPARTMENT_OTHER): Payer: Self-pay

## 2020-03-05 ENCOUNTER — Other Ambulatory Visit: Payer: Self-pay

## 2020-03-05 DIAGNOSIS — R600 Localized edema: Secondary | ICD-10-CM | POA: Insufficient documentation

## 2020-03-05 DIAGNOSIS — J45909 Unspecified asthma, uncomplicated: Secondary | ICD-10-CM | POA: Insufficient documentation

## 2020-03-05 DIAGNOSIS — I10 Essential (primary) hypertension: Secondary | ICD-10-CM | POA: Insufficient documentation

## 2020-03-05 DIAGNOSIS — M25572 Pain in left ankle and joints of left foot: Secondary | ICD-10-CM | POA: Insufficient documentation

## 2020-03-05 DIAGNOSIS — Z79899 Other long term (current) drug therapy: Secondary | ICD-10-CM | POA: Insufficient documentation

## 2020-03-05 DIAGNOSIS — R609 Edema, unspecified: Secondary | ICD-10-CM

## 2020-03-05 DIAGNOSIS — R0789 Other chest pain: Secondary | ICD-10-CM | POA: Insufficient documentation

## 2020-03-05 LAB — TROPONIN I (HIGH SENSITIVITY)
Troponin I (High Sensitivity): <2 ng/L (ref <18)
Troponin I (High Sensitivity): 2 ng/L (ref <18)

## 2020-03-05 LAB — CBC
HCT: 39.5 % (ref 36.0–46.0)
Hemoglobin: 12.8 g/dL (ref 12.0–15.0)
MCH: 28 pg (ref 26.0–34.0)
MCHC: 32.4 g/dL (ref 30.0–36.0)
MCV: 86.4 fL (ref 80.0–100.0)
Platelets: 344 10*3/uL (ref 150–400)
RBC: 4.57 MIL/uL (ref 3.87–5.11)
RDW: 13.6 % (ref 11.5–15.5)
WBC: 4.1 10*3/uL (ref 4.0–10.5)
nRBC: 0 % (ref 0.0–0.2)

## 2020-03-05 LAB — COMPREHENSIVE METABOLIC PANEL
ALT: 18 U/L (ref 0–44)
AST: 22 U/L (ref 15–41)
Albumin: 4.1 g/dL (ref 3.5–5.0)
Alkaline Phosphatase: 85 U/L (ref 38–126)
Anion gap: 12 (ref 5–15)
BUN: 11 mg/dL (ref 6–20)
CO2: 27 mmol/L (ref 22–32)
Calcium: 9.4 mg/dL (ref 8.9–10.3)
Chloride: 97 mmol/L — ABNORMAL LOW (ref 98–111)
Creatinine, Ser: 0.78 mg/dL (ref 0.44–1.00)
GFR, Estimated: >60 mL/min (ref >60)
Glucose, Bld: 95 mg/dL (ref 70–99)
Potassium: 3.9 mmol/L (ref 3.5–5.1)
Sodium: 136 mmol/L (ref 135–145)
Total Bilirubin: 0.8 mg/dL (ref 0.3–1.2)
Total Protein: 8 g/dL (ref 6.5–8.1)

## 2020-03-05 LAB — URINALYSIS, ROUTINE W REFLEX MICROSCOPIC
Bilirubin Urine: NEGATIVE
Glucose, UA: NEGATIVE mg/dL
Hgb urine dipstick: NEGATIVE
Ketones, ur: NEGATIVE mg/dL
Leukocytes,Ua: NEGATIVE
Nitrite: NEGATIVE
Protein, ur: NEGATIVE mg/dL
Specific Gravity, Urine: 1.01 (ref 1.005–1.030)
pH: 6 (ref 5.0–8.0)

## 2020-03-05 LAB — I-STAT BETA HCG BLOOD, ED (MC, WL, AP ONLY): I-stat hCG, quantitative: <5 m[IU]/mL (ref <5)

## 2020-03-05 LAB — CK: Total CK: 142 U/L (ref 38–234)

## 2020-03-05 MED ORDER — NAPROXEN 500 MG PO TABS: 500.0000 mg | ORAL_TABLET | Freq: Two times a day (BID) | ORAL | 0 refills | Status: DC

## 2020-03-05 MED ORDER — ACETAMINOPHEN 325 MG PO TABS
650.0000 mg | ORAL_TABLET | Freq: Once | ORAL | Status: AC
  Administered 2020-03-05: 650 mg via ORAL
  Filled 2020-03-05: qty 2

## 2020-03-05 MED ORDER — KETOROLAC TROMETHAMINE 30 MG/ML IJ SOLN
30.0000 mg | Freq: Once | INTRAMUSCULAR | Status: AC
  Administered 2020-03-05: 30 mg via INTRAVENOUS
  Filled 2020-03-05: qty 1

## 2020-03-05 NOTE — Progress Notes (Signed)
Left lower extremity venous duplex has been completed. Preliminary results can be found in CV Proc through chart review.  Results were given to [facility] PA.  03/05/20 8:45 AM Briana Wade RVT

## 2020-03-05 NOTE — Discharge Instructions (Signed)
Continue the medications you are prescribed by your primary care provider. Make sure you elevate your legs to help with swelling. Return to the ER if you start to experience worsening chest pain, leg swelling, shortness of breath, vomiting or coughing up blood.

## 2020-03-05 NOTE — ED Provider Notes (Signed)
[facility] DEPT Provider Note   CSN: [id number] Arrival date & time: 03/05/20  0701     History Chief Complaint  Patient presents with  . Chest Pain    Briana Wade is a 50 y.o. female with a past medical history of anemia, asthma, hypertension noncompliant with medications presenting to the ED with multiple complaints. 1.  Reports left-sided and central chest pressure for the past 3 weeks.  Symptoms have been intermittent without specific aggravating alleviating factor.  Has not tried medications to help with her symptoms.  Was evaluated by her PCP last week but states that "she did not do anything for me except she put me on some medicine."  She has been taking this medication but does not remember the name of it.  She does not feel that it is helping her.  No shortness of breath, cough, fever, abdominal pain, vomiting.  She has never had chest pain like this before. 2.  Reports of left ankle pain and swelling for the past month.  Reports cramping in bilateral lower extremities that is worse when she walks.  States that she does not feel that she stays adequately hydrated.  No history of DVT or PE, recent immobilization. 3.  Reports "knots" throughout her entire skin. She states these have been intermittent for several months in several parts of her body that will intermittently improved.    HPI     Past Medical History:  Diagnosis Date  . Abdominal pain, unspecified site   . Allergy   . Anemia   . Asthma   . Chlamydia   . Diverticulitis   . Esophageal reflux   . Hiatal hernia   . Hypertension   . IBS (irritable bowel syndrome)   . Insomnia   . Lactose intolerance   . Migraine equivalent 08/2014   minor right arm weakness in [facility]. neg CVA on imaging.   . Other constipation   . Personal history of colonic polyps 09/12/2010   tubular adenoma  . Trichimoniasis   . Tubular adenoma of colon   . Umbilical hernia   . Uterine fibroid   .  Uterine leiomyoma     Patient Active Problem List   Diagnosis Date Noted  . Diverticulosis of sigmoid colon 04/27/2016  . Acute diverticulitis of intestine 04/27/2016  . Diverticula of colon 05/21/2015  . Asthma 05/21/2015  . Acute diverticulitis 05/21/2015  . Diverticulitis of sigmoid colon 06/02/2011  . Nausea and vomiting 06/02/2011  . Otitis 06/02/2011  . HTN (hypertension) 06/02/2011  . GERD (gastroesophageal reflux disease) 10/21/2010  . Gluten intolerance 10/21/2010  . Personal history of colonic polyps 10/21/2010  . Flatulence, eructation, and gas pain 09/12/2010  . 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 Irritable bowel syndrome 09/12/2010  . Abdominal pain 08/26/2010  . Bloating 08/26/2010  . 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 IBS (irritable bowel syndrome) 08/26/2010  . CONSTIPATION, CHRONIC 05/11/2007  . ABDOMINAL PAIN, CHRONIC 05/11/2007    Past Surgical History:  Procedure Laterality Date  . APPENDECTOMY    . COLONOSCOPY    . DILATION AND CURETTAGE OF UTERUS     x2 for EAB  . HYSTEROSCOPY WITH NOVASURE N/A 12/17/2015   Procedure: HYSTEROSCOPY WITH NOVASURE;  Surgeon: Aletha Halim, MD;  Location: [facility];  Service: Gynecology;  Laterality: N/A;  . TUBAL LIGATION       OB History    Gravida  7   Para  3   Term  3   Preterm      AB  4   Living  3     SAB  1   IAB  3   Ectopic      Multiple      Live Births           Obstetric Comments  svd x 3        Family History  Problem Relation Age of Onset  . Hypertension Mother   . Cancer Mother        not sure of type  . Asthma Cousin   . Asthma Son   . Colon cancer Neg Hx   . Esophageal cancer Neg Hx   . Rectal cancer Neg Hx   . Stomach cancer Neg Hx     Social History   Tobacco Use  . Smoking status: Never Smoker  . Smokeless tobacco: Never Used  Vaping Use  . Vaping Use: Never used  Substance Use Topics  .  Alcohol use: No  . Drug use: No    Home Medications Prior to Admission medications   Medication Sig Start Date End Date Taking? Authorizing Provider  hydrochlorothiazide (HYDRODIURIL) 25 MG tablet Take 25 mg by mouth daily.   Yes [provider]  ibuprofen (ADVIL,MOTRIN) 200 MG tablet Take 800 mg by mouth every 6 (six) hours as needed for moderate pain.   Yes [provider]  losartan (COZAAR) 50 MG tablet Take 50 mg by mouth daily.   Yes [provider]  naproxen (NAPROSYN) 500 MG tablet Take 1 tablet (500 mg total) by mouth 2 (two) times daily. 03/05/20  Yes Lilliahna Schubring, PA-C  ondansetron (ZOFRAN ODT) 8 MG disintegrating tablet Take 1 tablet (8 mg total) by mouth every 8 (eight) hours as needed for nausea or vomiting. Patient not taking: Reported on 03/05/2020 01/21/20   Molpus, John, MD    Allergies    Lactose intolerance (gi)  Review of Systems   Review of Systems  Constitutional: Negative for appetite change, chills and fever.  HENT: Negative for ear pain, rhinorrhea, sneezing and sore throat.   Eyes: Negative for photophobia and visual disturbance.  Respiratory: Negative for cough, chest tightness, shortness of breath and wheezing.   Cardiovascular: Positive for chest pain and leg swelling. Negative for palpitations.  Gastrointestinal: Negative for abdominal pain, blood in stool, constipation, diarrhea, nausea and vomiting.  Genitourinary: Negative for dysuria, hematuria and urgency.  Musculoskeletal: Positive for myalgias.  Skin: Negative for rash.  Neurological: Negative for dizziness, weakness and light-headedness.    Physical Exam Updated Vital Signs BP 138/87   Pulse 79   Temp 98.1 F (36.7 C) (Oral)   Resp 15   Ht 5\' 1"  (1.549 m)   Wt 100.2 kg   SpO2 99%   BMI 41.76 kg/m   Physical Exam Vitals and nursing note reviewed.  Constitutional:      General: She is not in acute distress.    Appearance: She is well-developed and  well-nourished.     Comments: Speaking complete sentences without difficulty.  No signs of respiratory distress.  HENT:     Head: Normocephalic and atraumatic.     Nose: Nose normal.  Eyes:     General: No scleral icterus.       Left eye: No discharge.     Extraocular Movements: EOM normal.     Conjunctiva/sclera: Conjunctivae normal.  Cardiovascular:     Rate and Rhythm: Normal rate and regular rhythm.  Pulses: Intact distal pulses.     Heart sounds: Normal heart sounds. No murmur heard. No friction rub. No gallop.   Pulmonary:     Effort: Pulmonary effort is normal. No respiratory distress.     Breath sounds: Normal breath sounds.  Abdominal:     General: Bowel sounds are normal. There is no distension.     Palpations: Abdomen is soft.     Tenderness: There is no abdominal tenderness. There is no guarding.  Musculoskeletal:        General: Normal range of motion.     Cervical back: Normal range of motion and neck supple.     Right lower leg: No edema.     Left lower leg: No tenderness. Edema present.     Comments: Very minimal edema noted of the left lower extremity that is nonpitting.  2+ DP pulse noted bilaterally.  No calf tenderness noted bilaterally.  No erythema or warmth of the legs.  Skin:    General: Skin is warm and dry.     Findings: No rash.  Neurological:     Mental Status: She is alert.     Motor: No abnormal muscle tone.     Coordination: Coordination normal.  Psychiatric:        Mood and Affect: Mood and affect normal.     ED Results / Procedures / Treatments   Labs (all labs ordered are listed, but only abnormal results are displayed) Labs Reviewed  COMPREHENSIVE METABOLIC PANEL - Abnormal; Notable for the following components:      Result Value   Chloride 97 (*)    All other components within normal limits  CBC  CK  URINALYSIS, ROUTINE W REFLEX MICROSCOPIC  I-STAT BETA HCG BLOOD, ED ([facility], [facility], [facility] ONLY)  TROPONIN I (HIGH SENSITIVITY)  TROPONIN  I (HIGH SENSITIVITY)    EKG None    Radiology DG Chest 2 View  Result Date: 03/05/2020 CLINICAL DATA:  Chest pain and shortness of breath. EXAM: CHEST - 2 VIEW COMPARISON:  01/20/2020 FINDINGS: The lungs are clear without focal pneumonia, edema, pneumothorax or pleural effusion. The cardiopericardial silhouette is within normal limits for size. The visualized bony structures of the thorax show no acute abnormality. Stable anterior wedge deformity of L1 on the lateral projection. IMPRESSION: No active cardiopulmonary disease. Electronically Signed   By: Misty Stanley M.D.   On: 03/05/2020 07:56   VAS [place] LOWER EXTREMITY VENOUS (DVT) (ONLY [facility] & [facility] 7a-7p)  Result Date: 03/05/2020  Lower Venous DVT Study Indications: Edema.  Risk Factors: None identified. Limitations: Body habitus and poor ultrasound/tissue interface. Comparison Study: No prior studies. Performing Technologist: Oliver Hum RVT  Examination Guidelines: A complete evaluation includes B-mode imaging, spectral Doppler, color Doppler, and power Doppler as needed of all accessible portions of each vessel. Bilateral testing is considered an integral part of a complete examination. Limited examinations for reoccurring indications may be performed as noted. The reflux portion of the exam is performed with the patient in reverse Trendelenburg.  +-----+---------------+---------+-----------+----------+--------------+ RIGHTCompressibilityPhasicitySpontaneityPropertiesThrombus Aging +-----+---------------+---------+-----------+----------+--------------+ CFV  Full           Yes      Yes                                 +-----+---------------+---------+-----------+----------+--------------+   +---------+---------------+---------+-----------+----------+--------------+ LEFT     CompressibilityPhasicitySpontaneityPropertiesThrombus Aging +---------+---------------+---------+-----------+----------+--------------+ CFV      Full  Yes      Yes                                 +---------+---------------+---------+-----------+----------+--------------+ SFJ      Full                                                        +---------+---------------+---------+-----------+----------+--------------+ FV Prox  Full                                                        +---------+---------------+---------+-----------+----------+--------------+ FV Mid   Full                                                        +---------+---------------+---------+-----------+----------+--------------+ FV DistalFull                                                        +---------+---------------+---------+-----------+----------+--------------+ PFV      Full                                                        +---------+---------------+---------+-----------+----------+--------------+ POP      Full           Yes      Yes                                 +---------+---------------+---------+-----------+----------+--------------+ PTV      Full                                                        +---------+---------------+---------+-----------+----------+--------------+ PERO     Full                                                        +---------+---------------+---------+-----------+----------+--------------+     Summary: RIGHT: - No evidence of common femoral vein obstruction.  LEFT: - There is no evidence of deep vein thrombosis in the lower extremity.  - No cystic structure found in the popliteal fossa.  *See table(s) above for measurements and observations.    Preliminary     Procedures Procedures   Medications Ordered in ED Medications  acetaminophen (TYLENOL) tablet 650 mg (650 mg Oral  Given 03/05/20 1019)  ketorolac (TORADOL) 30 MG/ML injection 30 mg (30 mg Intravenous Given 03/05/20 1019)    ED Course  I have reviewed the triage vital signs and the nursing notes.  Pertinent labs &  imaging results that were available during my care of the patient were reviewed by me and considered in my medical decision making (see chart for details).  Clinical Course as of 03/05/20 1102  Tue Mar 05, 2020  6712 Ultrasound is negative for DVT in LLE. [HK]  0917 CK Total: 142 [HK]  0918 Troponin I (High Sensitivity): <2 [HK]  0918 Creatinine: 0.78 [HK]  1038 Urinalysis, Routine w reflex microscopic No abnormalities. [HK]    Clinical Course User Index [HK] Delia Heady, PA-C   MDM Rules/Calculators/A&P                          50 year old female with past medical history of anemia, asthma, hypertension noncompliant with medications presenting to the ED for chest pain, left ankle pain and swelling and feeling "knots" throughout her entire skin.  Regarding the chest pain reports it as a pressure sensation in the left side of her chest that has been intermittent for the past 3 weeks.  She was given hydrochlorothiazide by her PCP to help with edema but she does not feel that it is helping her although she has been urinating frequently.  Reports cramping in bilateral lower extremities that has been chronic for the past several months.  No history of DVT, PE, recent immobilization or MI.  On exam there is some edema of the left lower extremity although this is nonpitting.  No calf tenderness or erythema noted bilaterally.  Lungs are clear to auscultation bilaterally.  No signs of respiratory distress.  Chest is tender on exam.  EKG here shows sinus rhythm, no ischemic changes, no STEMI.  Chest x-ray without any acute findings.  Ultrasound of the left lower extremity without any evidence of DVT.  CK, troponin, CMP and CBC unremarkable.  Delta troponin is negative as well.  Patient given Tylenol and Toradol here with improvement in her symptoms.  I was unable to visualize any "knots" throughout her skin and no evidence of abscesses.  I am unsure exactly what is causing her symptoms although could be  peripheral edema chest wall pain.  She is low risk of PE as vital signs are within normal limits, she is not tachycardic or hypoxic and there is no evidence of DVT.  Her cardiac work-up here is reassuring so I doubt ACS.  We will have her continue her home medications as prescribed by her PCP.  Return precautions given.   Patient is hemodynamically stable, in NAD, and able to ambulate in the ED. Evaluation does not show pathology that would require ongoing emergent intervention or inpatient treatment. I explained the diagnosis to the patient. Pain has been managed and has no complaints prior to discharge. Patient is comfortable with above plan and is stable for discharge at this time. All questions were answered prior to disposition. Strict return precautions for returning to the ED were discussed. Encouraged follow up with PCP.   An After Visit Summary was printed and given to the patient.   Portions of this note were generated with [employer]. Dictation errors may occur despite best attempts at proofreading.  Final Clinical Impression(s) / ED Diagnoses Final diagnoses:  Chest wall pain  Peripheral edema    Rx / DC Orders ED  Discharge Orders         Ordered    naproxen (NAPROSYN) 500 MG tablet  2 times daily        03/05/20 1102           Delia Heady, PA-C 03/05/20 1102    Lacretia Leigh, MD 03/06/20 1353

## 2020-03-05 NOTE — ED Triage Notes (Signed)
Patient c/o intermittent chest pressure x 3 weeks. Patient c/o left ankle swelling x 1 month. Patient states she also has bruising to both legs and feels "knots and lumps in her thighs."

## 2020-03-05 NOTE — ED Notes (Signed)
US AT BEDSIDE

## 2020-03-05 NOTE — ED Notes (Signed)
ED Provider at bedside. 

## 2020-03-25 ENCOUNTER — Emergency Department (HOSPITAL_COMMUNITY): Payer: Medicaid Other

## 2020-03-25 ENCOUNTER — Other Ambulatory Visit: Payer: Self-pay

## 2020-03-25 ENCOUNTER — Emergency Department (HOSPITAL_COMMUNITY)
Admission: EM | Admit: 2020-03-25 | Discharge: 2020-03-25 | Disposition: A | Discharge status: Home / Self Care | Payer: Medicaid Other | Attending: Emergency Medicine | Admitting: Emergency Medicine

## 2020-03-25 DIAGNOSIS — Z5321 Procedure and treatment not carried out due to patient leaving prior to being seen by health care provider: Secondary | ICD-10-CM | POA: Insufficient documentation

## 2020-03-25 DIAGNOSIS — R0789 Other chest pain: Secondary | ICD-10-CM | POA: Insufficient documentation

## 2020-03-25 DIAGNOSIS — J45909 Unspecified asthma, uncomplicated: Secondary | ICD-10-CM | POA: Insufficient documentation

## 2020-03-25 LAB — BASIC METABOLIC PANEL
Anion gap: 11 (ref 5–15)
BUN: 12 mg/dL (ref 6–20)
CO2: 25 mmol/L (ref 22–32)
Calcium: 9.6 mg/dL (ref 8.9–10.3)
Chloride: 102 mmol/L (ref 98–111)
Creatinine, Ser: 0.87 mg/dL (ref 0.44–1.00)
GFR, Estimated: >60 mL/min (ref >60)
Glucose, Bld: 111 mg/dL — ABNORMAL HIGH (ref 70–99)
Potassium: 3.8 mmol/L (ref 3.5–5.1)
Sodium: 138 mmol/L (ref 135–145)

## 2020-03-25 LAB — CBC
HCT: 41.7 % (ref 36.0–46.0)
Hemoglobin: 12.8 g/dL (ref 12.0–15.0)
MCH: 27.1 pg (ref 26.0–34.0)
MCHC: 30.7 g/dL (ref 30.0–36.0)
MCV: 88.3 fL (ref 80.0–100.0)
Platelets: 279 10*3/uL (ref 150–400)
RBC: 4.72 MIL/uL (ref 3.87–5.11)
RDW: 13.8 % (ref 11.5–15.5)
WBC: 5.9 10*3/uL (ref 4.0–10.5)
nRBC: 0 % (ref 0.0–0.2)

## 2020-03-25 LAB — I-STAT BETA HCG BLOOD, ED (MC, WL, AP ONLY): I-stat hCG, quantitative: <5 m[IU]/mL (ref <5)

## 2020-03-25 LAB — TROPONIN I (HIGH SENSITIVITY): Troponin I (High Sensitivity): 4 ng/L (ref <18)

## 2020-03-25 NOTE — ED Notes (Signed)
Patient states she can not wait any longer, wants to go home. Advised pt to stay for treatment. Pt declined. Pt ambulatory to waiting area, no distress noted.

## 2020-03-25 NOTE — ED Triage Notes (Signed)
Pt returns with chest tightness and wants explanation and second opinion on bruising, chest discomfort. Seen recently at [facility] for same. Pt reports taking her inhaler with some relief. HX of asthma. VSS. NAD at present.

## 2020-03-26 ENCOUNTER — Emergency Department (HOSPITAL_COMMUNITY): Payer: Self-pay

## 2020-03-26 ENCOUNTER — Emergency Department (HOSPITAL_COMMUNITY)
Admission: EM | Admit: 2020-03-26 | Discharge: 2020-03-26 | Disposition: A | Discharge status: Home / Self Care | Payer: Self-pay | Attending: Emergency Medicine | Admitting: Emergency Medicine

## 2020-03-26 ENCOUNTER — Encounter (HOSPITAL_COMMUNITY): Payer: Self-pay

## 2020-03-26 DIAGNOSIS — R079 Chest pain, unspecified: Secondary | ICD-10-CM | POA: Insufficient documentation

## 2020-03-26 DIAGNOSIS — Z79899 Other long term (current) drug therapy: Secondary | ICD-10-CM | POA: Insufficient documentation

## 2020-03-26 DIAGNOSIS — X58XXXA Exposure to other specified factors, initial encounter: Secondary | ICD-10-CM | POA: Insufficient documentation

## 2020-03-26 DIAGNOSIS — M79672 Pain in left foot: Secondary | ICD-10-CM | POA: Insufficient documentation

## 2020-03-26 DIAGNOSIS — R600 Localized edema: Secondary | ICD-10-CM | POA: Insufficient documentation

## 2020-03-26 DIAGNOSIS — I10 Essential (primary) hypertension: Secondary | ICD-10-CM | POA: Insufficient documentation

## 2020-03-26 DIAGNOSIS — J45909 Unspecified asthma, uncomplicated: Secondary | ICD-10-CM | POA: Insufficient documentation

## 2020-03-26 DIAGNOSIS — S40022A Contusion of left upper arm, initial encounter: Secondary | ICD-10-CM | POA: Insufficient documentation

## 2020-03-26 DIAGNOSIS — Z20822 Contact with and (suspected) exposure to covid-19: Secondary | ICD-10-CM | POA: Insufficient documentation

## 2020-03-26 LAB — BRAIN NATRIURETIC PEPTIDE: B Natriuretic Peptide: 21.4 pg/mL (ref 0.0–100.0)

## 2020-03-26 LAB — HEMOGLOBIN A1C
Hgb A1c MFr Bld: 5.4 % (ref 4.8–5.6)
Mean Plasma Glucose: 108.28 mg/dL

## 2020-03-26 LAB — BASIC METABOLIC PANEL
Anion gap: 9 (ref 5–15)
BUN: 14 mg/dL (ref 6–20)
CO2: 26 mmol/L (ref 22–32)
Calcium: 9.3 mg/dL (ref 8.9–10.3)
Chloride: 103 mmol/L (ref 98–111)
Creatinine, Ser: 1.12 mg/dL — ABNORMAL HIGH (ref 0.44–1.00)
GFR, Estimated: 60 mL/min — ABNORMAL LOW (ref >60)
Glucose, Bld: 114 mg/dL — ABNORMAL HIGH (ref 70–99)
Potassium: 5.4 mmol/L — ABNORMAL HIGH (ref 3.5–5.1)
Sodium: 138 mmol/L (ref 135–145)

## 2020-03-26 LAB — CBC
HCT: 40.6 % (ref 36.0–46.0)
Hemoglobin: 12.3 g/dL (ref 12.0–15.0)
MCH: 27.6 pg (ref 26.0–34.0)
MCHC: 30.3 g/dL (ref 30.0–36.0)
MCV: 91 fL (ref 80.0–100.0)
Platelets: 246 10*3/uL (ref 150–400)
RBC: 4.46 MIL/uL (ref 3.87–5.11)
RDW: 14 % (ref 11.5–15.5)
WBC: 4.7 10*3/uL (ref 4.0–10.5)
nRBC: 0 % (ref 0.0–0.2)

## 2020-03-26 LAB — POTASSIUM: Potassium: 4.6 mmol/L (ref 3.5–5.1)

## 2020-03-26 LAB — I-STAT BETA HCG BLOOD, ED (MC, WL, AP ONLY): I-stat hCG, quantitative: <5 m[IU]/mL (ref <5)

## 2020-03-26 LAB — TROPONIN I (HIGH SENSITIVITY)
Troponin I (High Sensitivity): 3 ng/L (ref <18)
Troponin I (High Sensitivity): 4 ng/L (ref <18)

## 2020-03-26 LAB — POC SARS CORONAVIRUS 2 AG -  ED: SARS Coronavirus 2 Ag: NEGATIVE

## 2020-03-26 MED ORDER — IPRATROPIUM-ALBUTEROL 0.5-2.5 (3) MG/3ML IN SOLN
3.0000 mL | Freq: Once | RESPIRATORY_TRACT | Status: AC
  Administered 2020-03-26: 3 mL via RESPIRATORY_TRACT
  Filled 2020-03-26: qty 3

## 2020-03-26 NOTE — ED Notes (Signed)
Pt states she needs to leave at this time

## 2020-03-26 NOTE — ED Provider Notes (Signed)
[facility] DEPT Provider Note   CSN: [id number] Arrival date & time: 03/26/20  1036     History Chief Complaint  Patient presents with  . Chest Pain    Briana Wade is a 50 y.o. female.  HPI 50 year old female with a history of asthma, anemia, diverticulitis, hypertension, uterine fibroids presents to the ER with complaints of ongoing chest pain/tightness and lower extremity edema.  She was seen here approximately a month ago with same complaints.  She denies any significant changes in her symptoms, states that she just "wants answers".  She denies any shortness of breath, though states that she does have a history of asthma.  She has been taking albuterol with little relief.  No fevers or chills.  No dizziness, syncope, radiation to the back.  No dysuria or hematuria.  She also complains of occasional lumps that subside and turned into bruising.  She also complained of this at her last visit as well.  She reports that she did follow-up with her doctor, but thinks that she needs to change them because she feels like she is being not well taken care of due to the fact that she has no insurance.  She also complains of left foot pain has been ongoing for weeks, no known injuries, no numbness or tingling, no wounds.  She denies any history of diabetes, though states that she would like to be "tested here".    Past Medical History:  Diagnosis Date  . Abdominal pain, unspecified site   . Allergy   . Anemia   . Asthma   . Chlamydia   . Diverticulitis   . Esophageal reflux   . Hiatal hernia   . Hypertension   . IBS (irritable bowel syndrome)   . Insomnia   . Lactose intolerance   . Migraine equivalent 08/2014   minor right arm weakness in [facility]. neg CVA on imaging.   . Other constipation   . Personal history of colonic polyps 09/12/2010   tubular adenoma  . Trichimoniasis   . Tubular adenoma of colon   . Umbilical hernia   . Uterine fibroid   .  Uterine leiomyoma     Patient Active Problem List   Diagnosis Date Noted  . Diverticulosis of sigmoid colon 04/27/2016  . Acute diverticulitis of intestine 04/27/2016  . Diverticula of colon 05/21/2015  . Asthma 05/21/2015  . Acute diverticulitis 05/21/2015  . Diverticulitis of sigmoid colon 06/02/2011  . Nausea and vomiting 06/02/2011  . Otitis 06/02/2011  . HTN (hypertension) 06/02/2011  . GERD (gastroesophageal reflux disease) 10/21/2010  . Gluten intolerance 10/21/2010  . Personal history of colonic polyps 10/21/2010  . Flatulence, eructation, and gas pain 09/12/2010  . 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 Irritable bowel syndrome 09/12/2010  . Abdominal pain 08/26/2010  . Bloating 08/26/2010  . 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 IBS (irritable bowel syndrome) 08/26/2010  . CONSTIPATION, CHRONIC 05/11/2007  . ABDOMINAL PAIN, CHRONIC 05/11/2007    Past Surgical History:  Procedure Laterality Date  . APPENDECTOMY    . COLONOSCOPY    . DILATION AND CURETTAGE OF UTERUS     x2 for EAB  . HYSTEROSCOPY WITH NOVASURE N/A 12/17/2015   Procedure: HYSTEROSCOPY WITH NOVASURE;  Surgeon: Aletha Halim, MD;  Location: [facility];  Service: Gynecology;  Laterality: N/A;  . TUBAL LIGATION       OB History    Gravida  7   Para  3  Term  3   Preterm      AB  4   Living  3     SAB  1   IAB  3   Ectopic      Multiple      Live Births           Obstetric Comments  svd x 3        Family History  Problem Relation Age of Onset  . Hypertension Mother   . Cancer Mother        not sure of type  . Asthma Cousin   . Asthma Son   . Colon cancer Neg Hx   . Esophageal cancer Neg Hx   . Rectal cancer Neg Hx   . Stomach cancer Neg Hx     Social History   Tobacco Use  . Smoking status: Never Smoker  . Smokeless tobacco: Never Used  Vaping Use  . Vaping Use: Never used  Substance Use Topics  .  Alcohol use: No  . Drug use: No    Home Medications Prior to Admission medications   Medication Sig Start Date End Date Taking? Authorizing Provider  hydrochlorothiazide (HYDRODIURIL) 25 MG tablet Take 25 mg by mouth daily.   Yes [provider]  losartan (COZAAR) 50 MG tablet Take 50 mg by mouth daily.   Yes [provider]  Multiple Vitamin (MULTIVITAMIN WITH MINERALS) TABS tablet Take 1 tablet by mouth daily.   Yes [provider]  naproxen (NAPROSYN) 500 MG tablet Take 1 tablet (500 mg total) by mouth 2 (two) times daily. Patient taking differently: Take 500 mg by mouth 2 (two) times daily as needed for mild pain. 03/05/20  Yes Khatri, Hina, PA-C  ondansetron (ZOFRAN ODT) 8 MG disintegrating tablet Take 1 tablet (8 mg total) by mouth every 8 (eight) hours as needed for nausea or vomiting. Patient not taking: No sig reported 01/21/20   Molpus, John, MD    Allergies    Lactose intolerance (gi)  Review of Systems   Review of Systems  Constitutional: Negative for chills and fever.  HENT: Negative for ear pain and sore throat.   Eyes: Negative for pain and visual disturbance.  Respiratory: Positive for chest tightness. Negative for cough and shortness of breath.   Cardiovascular: Negative for chest pain and palpitations.  Gastrointestinal: Negative for abdominal pain and vomiting.  Genitourinary: Negative for dysuria and hematuria.  Musculoskeletal: Negative for arthralgias and back pain.  Skin: Negative for color change and rash.  Neurological: Negative for seizures and syncope.  All other systems reviewed and are negative.   Physical Exam Updated Vital Signs BP 140/89 (BP Location: Right Arm)   Pulse 62   Temp 97.7 F (36.5 C) (Oral)   Resp 13   SpO2 100%   Physical Exam Vitals and nursing note reviewed.  Constitutional:      General: She is not in acute distress.    Appearance: She is well-developed and well-nourished.  HENT:     Head:  Normocephalic and atraumatic.  Eyes:     Conjunctiva/sclera: Conjunctivae normal.  Cardiovascular:     Rate and Rhythm: Normal rate and regular rhythm.     Heart sounds: No murmur heard.   Pulmonary:     Effort: Pulmonary effort is normal. No respiratory distress.     Breath sounds: Wheezing (Scant wheezes in the lower lobes) present. No decreased breath sounds, rhonchi or rales.  Abdominal:     Palpations: Abdomen  is soft.     Tenderness: There is no abdominal tenderness.  Musculoskeletal:        General: No edema.     Cervical back: Neck supple.     Right lower leg: No tenderness. No edema.     Left lower leg: No tenderness. No edema.     Comments: Left foot with no significant edema, full plantar flexion and dorsi flexion of the ankle.  2+ DP pulses.  No visible wounds.  No overlying erythema, warmth, no pitting edema.  Able to move all 5 digits without difficulty.  Skin:    General: Skin is warm and dry.     Findings: No erythema or rash.     Comments: Left upper arm near the armpit with evidence of 2 small bruises.  She has no visible abscesses, fluctuance, warmth to her extremities.  Neurological:     General: No focal deficit present.     Mental Status: She is alert and oriented to person, place, and time.  Psychiatric:        Mood and Affect: Mood and affect normal.     ED Results / Procedures / Treatments   Labs (all labs ordered are listed, but only abnormal results are displayed) Labs Reviewed  BASIC METABOLIC PANEL - Abnormal; Notable for the following components:      Result Value   Potassium 5.4 (*)    Glucose, Bld 114 (*)    Creatinine, Ser 1.12 (*)    GFR, Estimated 60 (*)    All other components within normal limits  CBC  BRAIN NATRIURETIC PEPTIDE  POTASSIUM  HEMOGLOBIN A1C  I-STAT BETA HCG BLOOD, ED ([facility], [facility], [facility] ONLY)  POC SARS CORONAVIRUS 2 AG -  ED  TROPONIN I (HIGH SENSITIVITY)  TROPONIN I (HIGH SENSITIVITY)    EKG None  Radiology DG Chest  2 View  Result Date: 03/26/2020 CLINICAL DATA:  Chest pain for several days EXAM: CHEST - 2 VIEW COMPARISON:  03/25/2020 FINDINGS: The heart size and mediastinal contours are within normal limits. Both lungs are clear. The visualized skeletal structures are unremarkable. IMPRESSION: No active cardiopulmonary disease. Electronically Signed   By: Inez Catalina M.D.   On: 03/26/2020 12:06   DG Chest 2 View  Result Date: 03/25/2020 CLINICAL DATA:  Intermittent chest pain for several weeks EXAM: CHEST - 2 VIEW COMPARISON:  03/05/2020 FINDINGS: The heart size and mediastinal contours are within normal limits. Both lungs are clear. The visualized skeletal structures are unremarkable. IMPRESSION: No active cardiopulmonary disease. Electronically Signed   By: Inez Catalina M.D.   On: 03/25/2020 17:54   DG Ankle Complete Left  Result Date: 03/26/2020 CLINICAL DATA:  Left ankle pain. EXAM: LEFT ANKLE COMPLETE - 3+ VIEW COMPARISON:  None. FINDINGS: There is no evidence of fracture, dislocation, or joint effusion. There is no evidence of arthropathy or other focal bone abnormality. Soft tissues are unremarkable. IMPRESSION: Negative. Electronically Signed   By: Marijo Conception M.D.   On: 03/26/2020 12:48   DG Foot Complete Left  Result Date: 03/26/2020 CLINICAL DATA:  Left foot pain. EXAM: LEFT FOOT - COMPLETE 3+ VIEW COMPARISON:  None. FINDINGS: There is no evidence of fracture or dislocation. There is no evidence of arthropathy or other focal bone abnormality. Soft tissues are unremarkable. IMPRESSION: Negative. Electronically Signed   By: Marijo Conception M.D.   On: 03/26/2020 12:47    Procedures Procedures   Medications Ordered in ED Medications  ipratropium-albuterol (DUONEB) 0.5-2.5 (  3) MG/3ML nebulizer solution 3 mL (3 mLs Nebulization Given 03/26/20 1321)    ED Course  I have reviewed the triage vital signs and the nursing notes.  Pertinent labs & imaging results that were available during my care of  the patient were reviewed by me and considered in my medical decision making (see chart for details).    MDM Rules/Calculators/A&P                         50 year old female who presents to the ER with complaints of ongoing chest tightness.  She was seen here approximately a month ago with similar complaint.  Her work-up then was benign.  She returns complaining of similar symptoms, chest pain, no shortness of breath, no concrete pain.  She also complains of swelling and lumps to her extremities occasionally which then turned into bruises.  She does have evidence of 2 small bruises on the left upper arm, however she has no evidence of an abscess or mass.  On exam, lung sounds with scant wheezes in the lower lobes bilaterally.  She is in no respiratory distress.  Vitals overall reassuring, no evidence of tachycardia, and tachypnea or hypoxia.  She has no appreciable pitting edema to her lower extremities, though she feels as though that she is swollen.  She has no evidence of injury to the left ankle, no evidence of cellulitis, abscess, infection, no deformities.  Personally reviewed her visit from 03/05/2020, she had a reassuring work-up, with negative troponins, labs, delta troponins.  She was also evaluated for DVT which was also negative.  Labs and imaging ordered, reviewed and interpreted by me.  CBC is unremarkable.  BMP with evidence of a potassium of 5.4, however repeat potassium is 4.6.  She does have a creatinine 1.12 which is slightly elevated from her normal baseline, however no signs of AKI.  Initial troponin of 3.  Plain films of the ankle and foot without acute abnormalities, chest x-ray without evidence signs of infection.  EKG normal sinus rhythm, largely unchanged from prior.  Low suspicion for dissection, ACS, PE since this is been ongoing for over a month now.  Given wheezing on exam, patient was given a DuoNeb treatment, patient notes mild improvement in symptoms.  Unfortunately the  patient had informed nursing staff that she needed to leave prior to the completion of her work-up.  I was not able to have the conversation about the risks of leaving [place].  Her BNP is normal.  I had plan to give her follow-up for [facility], however the patient eloped without receiving any discharge paperwork.  Overall work-up reassuring, no evidence of life-threatening causes of her symptoms.  Was able to reach the patient by phone, and reassured her of the reassuring lab work.  Was encouraged to continue to drink water.   community health wellness info given over the phone I stressed follow-up.  She voiced understanding is agreeable  Final Clinical Impression(s) / ED Diagnoses Final diagnoses:  Chest pain, unspecified type    Rx / DC Orders ED Discharge Orders    None       Lyndel Safe 03/26/20 1501    Lacretia Leigh, MD 03/27/20 [phone]

## 2020-03-26 NOTE — ED Triage Notes (Signed)
Pt presents with c/o chest pain for several days and swelling over her whole body.

## 2020-04-26 ENCOUNTER — Ambulatory Visit: Payer: Medicaid Other | Admitting: Cardiology

## 2020-04-26 NOTE — Progress Notes (Deleted)
Primary Physician/Referring:  Trey Sailors, PA  Patient ID: Briana Wade, female    DOB: [birth year], 50 y.o.   MRN: [id number]  No chief complaint on file.  HPI:    Briana Wade  is a 50 y.o. African-American female patient referred to me for evaluation of dyspnea on exertion, chest pain and abnormal EKG, patient seen in the emergency room on 03/26/2020.  Past medical history significant for bronchial asthma/reactive airway disease, GERD and morbid obesity.  ***  Past Medical History:  Diagnosis Date  . Abdominal pain, unspecified site   . Allergy   . Anemia   . Asthma   . Chlamydia   . Diverticulitis   . Esophageal reflux   . Hiatal hernia   . Hypertension   . IBS (irritable bowel syndrome)   . Insomnia   . Lactose intolerance   . Migraine equivalent 08/2014   minor right arm weakness in [facility]. neg CVA on imaging.   . Other constipation   . Personal history of colonic polyps 09/12/2010   tubular adenoma  . Trichimoniasis   . Tubular adenoma of colon   . Umbilical hernia   . Uterine fibroid   . Uterine leiomyoma    Past Surgical History:  Procedure Laterality Date  . APPENDECTOMY    . COLONOSCOPY    . DILATION AND CURETTAGE OF UTERUS     x2 for EAB  . HYSTEROSCOPY WITH NOVASURE N/A 12/17/2015   Procedure: HYSTEROSCOPY WITH NOVASURE;  Surgeon: Aletha Halim, MD;  Location: [facility];  Service: Gynecology;  Laterality: N/A;  . TUBAL LIGATION     Family History  Problem Relation Age of Onset  . Hypertension Mother   . Cancer Mother        not sure of type  . Asthma Cousin   . Asthma Son   . Colon cancer Neg Hx   . Esophageal cancer Neg Hx   . Rectal cancer Neg Hx   . Stomach cancer Neg Hx     Social History   Tobacco Use  . Smoking status: Never Smoker  . Smokeless tobacco: Never Used  Substance Use Topics  . Alcohol use: No   Marital Status: Single  ROS  ***ROS Objective  There were no vitals taken for this visit.   ***Physical Exam   Constitutional: No distress.  Eyes: Conjunctivae are normal.  Neck: No JVD present.  Cardiovascular: Regular rhythm, normal heart sounds, intact distal pulses and normal pulses. Exam reveals no gallop.  No murmur heard. Pulmonary/Chest: Effort normal and breath sounds normal. She exhibits no tenderness.  Abdominal: Soft. Bowel sounds are normal.  Musculoskeletal:        General: No edema. Normal range of motion.     Cervical back: Neck supple.  Neurological: She is alert and oriented to person, place, and time.  Skin: Skin is warm.     Laboratory examination:   Recent Labs    03/05/20 0816 03/25/20 1729 03/26/20 1047 03/26/20 1319  NA 136 138 138  --   K 3.9 3.8 5.4* 4.6  CL 97* 102 103  --   CO2 _0 --   GLUCOSE 95 111* 114*  --   BUN _1 --   CREATININE 0.78 0.87 1.12*  --   CALCIUM 9.4 9.6 9.3  --   GFRNONAA >60 >60 60*  --    CrCl cannot be calculated (Patient's most recent lab result is older than the  maximum 21 days allowed.).  CMP Latest Ref Rng & Units 03/26/2020 03/26/2020 03/25/2020  Glucose 70 - 99 mg/dL - 114(H) 111(H)  BUN 6 - 20 mg/dL - 14 12  Creatinine 0.44 - 1.00 mg/dL - 1.12(H) 0.87  Sodium 135 - 145 mmol/L - 138 138  Potassium 3.5 - 5.1 mmol/L 4.6 5.4(H) 3.8  Chloride 98 - 111 mmol/L - 103 102  CO2 22 - 32 mmol/L - 26 25  Calcium 8.9 - 10.3 mg/dL - 9.3 9.6  Total Protein 6.5 - 8.1 g/dL - - -  Total Bilirubin 0.3 - 1.2 mg/dL - - -  Alkaline Phos 38 - 126 U/L - - -  AST 15 - 41 U/L - - -  ALT 0 - 44 U/L - - -   CBC Latest Ref Rng & Units 03/26/2020 03/25/2020 03/05/2020  WBC 4.0 - 10.5 K/uL 4.7 5.9 4.1  Hemoglobin 12.0 - 15.0 g/dL 12.3 12.8 12.8  Hematocrit 36.0 - 46.0 % 40.6 41.7 39.5  Platelets 150 - 400 K/uL 246 279 344    Lipid Panel No results for input(s): CHOL, TRIG, LDLCALC, VLDL, HDL, CHOLHDL, LDLDIRECT in the last 8760 hours. Lipid Panel  No results found for: CHOL, TRIG, HDL, CHOLHDL, VLDL, LDLCALC, LDLDIRECT, LABVLDL    HEMOGLOBIN A1C Lab Results  Component Value Date   HGBA1C 5.4 03/26/2020   MPG 108.28 03/26/2020    Component Ref Range & Units 1 mo ago  (03/26/20) 1 mo ago  (03/26/20) 1 mo ago  (03/25/20) 1 mo ago  (03/05/20) 1 mo ago  (03/05/20) 3 mo ago  (01/21/20) 3 mo ago  (01/20/20)  Troponin I (High Sensitivity) <18 ng/L 4  3 CM  4 CM            External labs:    Labs 02/09/2019:  Serum glucose 86 mg, BUN 11, creatinine 0.70, EGFR 118 mL, potassium 4.1.  TSH normal at 3.510.  Hb 12.6/HCT 37.0, platelets 291.  Total cholesterol 183, triglycerides 107, HDL 62, LDL 102.  B12 low limit of normal, iron studies normal.  Medications and allergies   Allergies  Allergen Reactions  . Lactose Intolerance (Gi)      Current Outpatient Medications on File Prior to Visit  Medication Sig Dispense Refill  . hydrochlorothiazide (HYDRODIURIL) 25 MG tablet Take 25 mg by mouth daily.    Marland Kitchen losartan (COZAAR) 50 MG tablet Take 50 mg by mouth daily.  1  . Multiple Vitamin (MULTIVITAMIN WITH MINERALS) TABS tablet Take 1 tablet by mouth daily.    . naproxen (NAPROSYN) 500 MG tablet Take 1 tablet (500 mg total) by mouth 2 (two) times daily. (Patient taking differently: Take 500 mg by mouth 2 (two) times daily as needed for mild pain.) 30 tablet 0  . ondansetron (ZOFRAN ODT) 8 MG disintegrating tablet Take 1 tablet (8 mg total) by mouth every 8 (eight) hours as needed for nausea or vomiting. (Patient not taking: No sig reported) 20 tablet 0   No current facility-administered medications on file prior to visit.     Radiology:   Chest x-ray PA and lateral view 03/26/2020: The heart size and mediastinal contours are within normal limits. Both lungs are clear. The visualized skeletal structures are unremarkable  Cardiac Studies:   Echocardiogram 02/09/2019: LVEF 55 to 62% with mild diastolic dysfunction.  Left atrial abnormality.  Mild LVH.  Lower Extremity Venous Duplex 03/05/2020: There is no  evidence of DVT in bilateral lower extremity.  No cystic structure  found in the popliteal fossa.  EKG:     ***    EKG 03/26/2020: Normal sinus rhythm at rate of 81 bpm, leftward axis, inferior and anterolateral nonspecific T abnormality, cannot exclude ischemia.  Compared to 03/05/2020, no significant change.  However T wave abnormality new compared to 01/20/2020.  Assessment  No diagnosis found.   There are no discontinued medications.  No orders of the defined types were placed in this encounter.  No orders of the defined types were placed in this encounter.  Recommendations:   Briana Wade is a 50 y.o. African-American female patient referred to me for evaluation of dyspnea on exertion, chest pain and abnormal EKG, patient seen in the emergency room on 03/26/2020.  Past medical history significant for bronchial asthma/reactive airway disease, GERD and morbid obesity.  ***    Adrian Prows, MD, [facility] 04/26/2020, 7:39 AM Office: [phone]

## 2020-05-13 ENCOUNTER — Other Ambulatory Visit: Payer: Self-pay

## 2020-05-13 ENCOUNTER — Encounter (HOSPITAL_BASED_OUTPATIENT_CLINIC_OR_DEPARTMENT_OTHER): Payer: Self-pay | Admitting: *Deleted

## 2020-05-13 ENCOUNTER — Emergency Department (HOSPITAL_BASED_OUTPATIENT_CLINIC_OR_DEPARTMENT_OTHER)
Admission: EM | Admit: 2020-05-13 | Discharge: 2020-05-13 | Disposition: A | Discharge status: Home / Self Care | Payer: Self-pay | Attending: Emergency Medicine | Admitting: Emergency Medicine

## 2020-05-13 ENCOUNTER — Emergency Department (HOSPITAL_BASED_OUTPATIENT_CLINIC_OR_DEPARTMENT_OTHER): Payer: Self-pay

## 2020-05-13 DIAGNOSIS — Z8669 Personal history of other diseases of the nervous system and sense organs: Secondary | ICD-10-CM | POA: Insufficient documentation

## 2020-05-13 DIAGNOSIS — R519 Headache, unspecified: Secondary | ICD-10-CM | POA: Insufficient documentation

## 2020-05-13 DIAGNOSIS — I10 Essential (primary) hypertension: Secondary | ICD-10-CM | POA: Insufficient documentation

## 2020-05-13 DIAGNOSIS — J45909 Unspecified asthma, uncomplicated: Secondary | ICD-10-CM | POA: Insufficient documentation

## 2020-05-13 DIAGNOSIS — Z79899 Other long term (current) drug therapy: Secondary | ICD-10-CM | POA: Insufficient documentation

## 2020-05-13 DIAGNOSIS — R0789 Other chest pain: Secondary | ICD-10-CM | POA: Insufficient documentation

## 2020-05-13 LAB — CBC WITH DIFFERENTIAL/PLATELET
Abs Immature Granulocytes: 0.01 10*3/uL (ref 0.00–0.07)
Basophils Absolute: 0 10*3/uL (ref 0.0–0.1)
Basophils Relative: 1 %
Eosinophils Absolute: 0.2 10*3/uL (ref 0.0–0.5)
Eosinophils Relative: 3 %
HCT: 37.2 % (ref 36.0–46.0)
Hemoglobin: 11.8 g/dL — ABNORMAL LOW (ref 12.0–15.0)
Immature Granulocytes: 0 %
Lymphocytes Relative: 37 %
Lymphs Abs: 2 10*3/uL (ref 0.7–4.0)
MCH: 27.6 pg (ref 26.0–34.0)
MCHC: 31.7 g/dL (ref 30.0–36.0)
MCV: 87.1 fL (ref 80.0–100.0)
Monocytes Absolute: 0.3 10*3/uL (ref 0.1–1.0)
Monocytes Relative: 6 %
Neutro Abs: 2.9 10*3/uL (ref 1.7–7.7)
Neutrophils Relative %: 53 %
Platelets: 305 10*3/uL (ref 150–400)
RBC: 4.27 MIL/uL (ref 3.87–5.11)
RDW: 13.3 % (ref 11.5–15.5)
WBC: 5.4 10*3/uL (ref 4.0–10.5)
nRBC: 0 % (ref 0.0–0.2)

## 2020-05-13 LAB — BASIC METABOLIC PANEL
Anion gap: 8 (ref 5–15)
BUN: 14 mg/dL (ref 6–20)
CO2: 27 mmol/L (ref 22–32)
Calcium: 8.9 mg/dL (ref 8.9–10.3)
Chloride: 99 mmol/L (ref 98–111)
Creatinine, Ser: 0.81 mg/dL (ref 0.44–1.00)
GFR, Estimated: >60 mL/min (ref >60)
Glucose, Bld: 93 mg/dL (ref 70–99)
Potassium: 4.2 mmol/L (ref 3.5–5.1)
Sodium: 134 mmol/L — ABNORMAL LOW (ref 135–145)

## 2020-05-13 LAB — TROPONIN I (HIGH SENSITIVITY): Troponin I (High Sensitivity): 2 ng/L (ref <18)

## 2020-05-13 MED ORDER — DIPHENHYDRAMINE HCL 50 MG/ML IJ SOLN
25.0000 mg | Freq: Once | INTRAMUSCULAR | Status: AC
  Administered 2020-05-13: 25 mg via INTRAVENOUS
  Filled 2020-05-13: qty 1

## 2020-05-13 MED ORDER — SODIUM CHLORIDE 0.9 % IV BOLUS
1000.0000 mL | Freq: Once | INTRAVENOUS | Status: AC
  Administered 2020-05-13: 1000 mL via INTRAVENOUS

## 2020-05-13 MED ORDER — METOCLOPRAMIDE HCL 5 MG/ML IJ SOLN
10.0000 mg | Freq: Once | INTRAMUSCULAR | Status: AC
  Administered 2020-05-13: 10 mg via INTRAVENOUS
  Filled 2020-05-13: qty 2

## 2020-05-13 NOTE — Discharge Instructions (Signed)
Your evaluation today has been reassuring.  Given the symptoms have been ongoing now for 2 to 3 months it is extremely important that you follow-up with your primary doctor as well as with the cardiologist and neurologist for further evaluation.  In the meantime some of your pain may be due to musculoskeletal pain or costochondritis in the chest, you can try using ibuprofen or Aleve regularly to help decrease inflammation and ease this pain.  This may help to lessen your headaches as well.  Please make sure you are drinking plenty of water and getting enough sleep as this can contribute to frequent headaches as well.  Return for new or worsening symptoms.

## 2020-05-13 NOTE — ED Notes (Signed)
Unsuccessful IV attempt x2 by 2 RNs.  Awaiting availability of placement by [facility]

## 2020-05-13 NOTE — ED Notes (Signed)
ED Provider at bedside. 

## 2020-05-13 NOTE — ED Triage Notes (Signed)
Headache x 2 weeks.

## 2020-05-13 NOTE — ED Provider Notes (Signed)
[facility] DEPART[facility] Provider Note   CSN: [id number] Arrival date & time: 05/13/20  1255     History Chief Complaint  Patient presents with  . Headache    VERNICE MANNINA is a 50 y.o. female.  LORRINE KILLILEA is a 50 y.o. female with a history of hypertension, irritable bowel syndrome, GERD, uterine fibroids, asthma, migraines, who presents to the emergency department for evaluation of frequent headaches and chest pain.  She initially stated that the headaches have been going on for 2 weeks, but in talking with the patient more it sounds like these have been occurring more frequently over the past 2 to 3 months.  She also reports she has been having frequent chest pains.  She has been evaluated multiple times in the emergency department for this chest pain and has had a reassuring work-up, but has not followed up with anyone as an outpatient regarding the symptoms.  She reports a prior history of migraines, but recently in particular over the past 2 weeks, she has been having headaches every single day.  She reports they are located over her temples, usually worse on the right than left and feel like a tight headband.  She denies any associated vision changes, jaw pain, facial droop, dizziness, vomiting, numbness or weakness.  No neck pain or stiffness, no fevers.  She reports that she usually gets some relief for a few hours when she takes ibuprofen but then headache comes back.  She reports headaches come on gradually and are not severe or sudden in onset.  She reports that pain in her chest has occurred intermittently, not exertional or associated with shortness of breath, lightheadedness or syncope.  No abdominal pain, nausea or vomiting.  Does seem to sometimes be worse with movement.  No prior history of MI, and patient has had multiple negative cardiac work-ups.         Past Medical History:  Diagnosis Date  . Abdominal pain, unspecified site   . Allergy   .  Anemia   . Asthma   . Chlamydia   . Diverticulitis   . Esophageal reflux   . Hiatal hernia   . Hypertension   . IBS (irritable bowel syndrome)   . Insomnia   . Lactose intolerance   . Migraine equivalent 08/2014   minor right arm weakness in [facility]. neg CVA on imaging.   . Other constipation   . Personal history of colonic polyps 09/12/2010   tubular adenoma  . Trichimoniasis   . Tubular adenoma of colon   . Umbilical hernia   . Uterine fibroid   . Uterine leiomyoma     Patient Active Problem List   Diagnosis Date Noted  . Diverticulosis of sigmoid colon 04/27/2016  . Acute diverticulitis of intestine 04/27/2016  . Diverticula of colon 05/21/2015  . Asthma 05/21/2015  . Acute diverticulitis 05/21/2015  . Diverticulitis of sigmoid colon 06/02/2011  . Nausea and vomiting 06/02/2011  . Otitis 06/02/2011  . HTN (hypertension) 06/02/2011  . GERD (gastroesophageal reflux disease) 10/21/2010  . Gluten intolerance 10/21/2010  . Personal history of colonic polyps 10/21/2010  . Flatulence, eructation, and gas pain 09/12/2010  . 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 Irritable bowel syndrome 09/12/2010  . Abdominal pain 08/26/2010  . Bloating 08/26/2010  . 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 IBS (irritable bowel syndrome) 08/26/2010  . CONSTIPATION, CHRONIC 05/11/2007  . ABDOMINAL PAIN, CHRONIC 05/11/2007    Past Surgical History:  Procedure Laterality Date  . APPENDECTOMY    . COLONOSCOPY    . DILATION AND CURETTAGE OF UTERUS     x2 for EAB  . HYSTEROSCOPY WITH NOVASURE N/A 12/17/2015   Procedure: HYSTEROSCOPY WITH NOVASURE;  Surgeon: Aletha Halim, MD;  Location: [facility];  Service: Gynecology;  Laterality: N/A;  . TUBAL LIGATION       OB History    Gravida  7   Para  3   Term  3   Preterm      AB  4   Living  3     SAB  1   IAB  3   Ectopic      Multiple      Live Births            Obstetric Comments  svd x 3        Family History  Problem Relation Age of Onset  . Hypertension Mother   . Cancer Mother        not sure of type  . Asthma Cousin   . Asthma Son   . Colon cancer Neg Hx   . Esophageal cancer Neg Hx   . Rectal cancer Neg Hx   . Stomach cancer Neg Hx     Social History   Tobacco Use  . Smoking status: Never Smoker  . Smokeless tobacco: Never Used  Vaping Use  . Vaping Use: Never used  Substance Use Topics  . Alcohol use: No  . Drug use: No    Home Medications Prior to Admission medications   Medication Sig Start Date End Date Taking? Authorizing Provider  GABAPENTIN PO Take by mouth.   Yes [provider]  hydrochlorothiazide (HYDRODIURIL) 25 MG tablet Take 25 mg by mouth daily.   Yes [provider]  metoprolol tartrate (LOPRESSOR) 25 MG tablet Take 25 mg by mouth 2 (two) times daily. 04/01/20  Yes [provider]  losartan (COZAAR) 50 MG tablet Take 50 mg by mouth daily.    [provider]  Multiple Vitamin (MULTIVITAMIN WITH MINERALS) TABS tablet Take 1 tablet by mouth daily.    [provider]  naproxen (NAPROSYN) 500 MG tablet Take 1 tablet (500 mg total) by mouth 2 (two) times daily. Patient taking differently: Take 500 mg by mouth 2 (two) times daily as needed for mild pain. 03/05/20   Khatri, Hina, PA-C  ondansetron (ZOFRAN ODT) 8 MG disintegrating tablet Take 1 tablet (8 mg total) by mouth every 8 (eight) hours as needed for nausea or vomiting. Patient not taking: No sig reported 01/21/20   Molpus, John, MD    Allergies    Lactose intolerance (gi)  Review of Systems   Review of Systems  Constitutional: Negative for chills and fever.  Eyes: Negative for visual disturbance.  Respiratory: Negative for cough and shortness of breath.   Cardiovascular: Positive for chest pain. Negative for leg swelling.  Gastrointestinal: Negative for abdominal pain, nausea and vomiting.  Musculoskeletal:  Negative for arthralgias, myalgias, neck pain and neck stiffness.  Skin: Negative for color change and rash.  Neurological: Positive for headaches. Negative for dizziness, syncope, facial asymmetry, speech difficulty, weakness, light-headedness and numbness.  All other systems reviewed and are negative.   Physical Exam Updated Vital Signs BP (!) 140/104 (BP Location: Right Arm)   Pulse 75   Temp 98 F (36.7 C) (Oral)   Resp 18   Ht 5\' 1"  (1.549 m)   Wt 97.8 kg  SpO2 97%   BMI 40.74 kg/m   Physical Exam Vitals and nursing note reviewed.  Constitutional:      General: She is not in acute distress.    Appearance: She is well-developed. She is not ill-appearing or diaphoretic.  HENT:     Head: Normocephalic and atraumatic.     Comments: No tenderness over temporal arteries, no bruits noted    Mouth/Throat:     Mouth: Mucous membranes are moist.     Pharynx: Oropharynx is clear.  Eyes:     General:        Right eye: No discharge.        Left eye: No discharge.     Extraocular Movements: Extraocular movements intact.     Pupils: Pupils are equal, round, and reactive to light.  Neck:     Meningeal: Kernig's sign absent.     Comments: No rigidity Cardiovascular:     Rate and Rhythm: Normal rate and regular rhythm.     Heart sounds: Normal heart sounds.  Pulmonary:     Effort: Pulmonary effort is normal. No respiratory distress.     Breath sounds: Normal breath sounds. No wheezing or rales.     Comments: Respirations equal and unlabored, patient able to speak in full sentences, lungs clear to auscultation bilaterally  Abdominal:     General: Bowel sounds are normal. There is no distension.     Palpations: Abdomen is soft. There is no mass.     Tenderness: There is no abdominal tenderness. There is no guarding.     Comments: Abdomen soft, nondistended, nontender to palpation in all quadrants without guarding or peritoneal signs   Musculoskeletal:        General: No  deformity.     Cervical back: Neck supple. No rigidity.  Skin:    General: Skin is warm and dry.     Capillary Refill: Capillary refill takes less than 2 seconds.  Neurological:     Mental Status: She is alert and oriented to person, place, and time.     Coordination: Coordination normal.     Comments: Speech is clear, able to follow commands CN III-XII intact Normal strength in upper and lower extremities bilaterally including dorsiflexion and plantar flexion, strong and equal grip strength Sensation normal to light and sharp touch Moves extremities without ataxia, coordination intact  Psychiatric:        Mood and Affect: Mood normal.        Behavior: Behavior normal.     ED Results / Procedures / Treatments   Labs (all labs ordered are listed, but only abnormal results are displayed) Labs Reviewed  BASIC METABOLIC PANEL - Abnormal; Notable for the following components:      Result Value   Sodium 134 (*)    All other components within normal limits  CBC WITH DIFFERENTIAL/PLATELET - Abnormal; Notable for the following components:   Hemoglobin 11.8 (*)    All other components within normal limits  TROPONIN I (HIGH SENSITIVITY)    EKG EKG Interpretation  Date/Time:  Monday May 13 2020 14:07:14 EDT Ventricular Rate:  62 PR Interval:  168 QRS Duration: 90 QT Interval:  431 QTC Calculation: 438 R Axis:   8 Text Interpretation: Sinus rhythm Left ventricular hypertrophy Borderline T abnormalities, inferior leads EKG similar to prior Confirmed by Lorre Munroe ([id number]) on 05/13/2020 3:37:59 PM   Radiology DG Chest 2 View  Result Date: 05/13/2020 CLINICAL DATA:  Headache for 2 weeks, chest pain EXAM:  CHEST - 2 VIEW COMPARISON:  03/26/2020 FINDINGS: Normal heart size, mediastinal contours, and pulmonary vascularity. Lungs clear. No pleural effusion or pneumothorax. Chronic superior endplate compression fracture of L1. No acute osseous findings. IMPRESSION: No acute abnormalities.  Chronic superior endplate compression fracture of L1 vertebral body. Electronically Signed   By: Lavonia Dana M.D.   On: 05/13/2020 15:05   CT Head Wo Contrast  Result Date: 05/13/2020 CLINICAL DATA:  New or worsening headache for 2 weeks, chest pain EXAM: CT HEAD WITHOUT CONTRAST TECHNIQUE: Contiguous axial images were obtained from the base of the skull through the vertex without intravenous contrast. Sagittal and coronal MPR images reconstructed from axial data set. COMPARISON:  01/20/2013 FINDINGS: Brain: Normal ventricular morphology. No midline shift or mass effect. Normal appearance of brain parenchyma. No intracranial hemorrhage, mass lesion, evidence of acute infarction, or extra-axial fluid collection. Vascular: No hyperdense vessels Skull: Intact Sinuses/Orbits: Clear Other: N/A IMPRESSION: Normal exam. Electronically Signed   By: Lavonia Dana M.D.   On: 05/13/2020 15:04    Procedures Procedures   Medications Ordered in ED Medications  sodium chloride 0.9 % bolus 1,000 mL (0 mLs Intravenous Stopped 05/13/20 1649)  metoCLOPramide (REGLAN) injection 10 mg (10 mg Intravenous Given 05/13/20 1544)  diphenhydrAMINE (BENADRYL) injection 25 mg (25 mg Intravenous Given 05/13/20 1544)    ED Course  I have reviewed the triage vital signs and the nursing notes.  Pertinent labs & imaging results that were available during my care of the patient were reviewed by me and considered in my medical decision making (see chart for details).    MDM Rules/Calculators/A&P                         Patient presents to the emergency department with chest pain and frequent headaches over the past 2-3 months. Patient nontoxic appearing, in no apparent distress, vitals without significant abnormality. Fairly benign physical exam.  No prior cardiac hx, has not seen a cardiologist or neurologist.  DDX: ACS, pulmonary embolism, dissection, pneumothorax, pneumonia, arrhythmia, severe anemia, MSK, GERD, anxiety,  abdominal process. For headaches: tension headaches, migraines, cluster headaches, meningitis, temporal arteritis, SAH, ICH, IIH  Additional history obtained:  Additional history obtained from chart review & nursing note review.   EKG: Sinus rhythm without concerning changes  Lab Tests:  I Ordered, reviewed, and interpreted labs, which included:  CBC: No leukocytosis, stable hgb BMP: Na 134, no other electrolyte derangements, normal renal functions Troponin: negative, given pain has been occurring for months, and pt has had multiple negative workups do not feel delta trop is needed.  Imaging Studies ordered:  I ordered imaging studies which included CXR and head CT, I independently reviewed, formal radiology impression shows:   CXR: No active cardiopulmonary disease  CT Head: No acute abnoralities  ED Course:   EKG without obvious acute ischemia, delta troponin negative, doubt ACS. Patient is low risk wells, PERC negative, doubt pulmonary embolism. Pain is not a tearing sensation, symmetric pulses, no widening of mediastinum on CXR, doubt dissection. Cardiac monitor reviewed, no notable arrhythmias or tachycardia. No neuro deficits and head CT is clear. Does report temporal headaches, but no tenderness over temporal arteries, no bruits, no associated vision changes or jaw pain, low suspicion for temporal arteritis. No concern for meningitis, low suspicion for intercranial hemorrhage. Patient has appeared hemodynamically stable throughout ER visit and appears safe for discharge with close PCP, cardiology and neurology follow up. I discussed results, treatment  plan, need for PCP follow-up, and return precautions with the patient. Provided opportunity for questions, patient confirmed understanding and is in agreement with plan.    Portions of this note were generated with [employer]. Dictation errors may occur despite best attempts at proofreading.   Final Clinical  Impression(s) / ED Diagnoses Final diagnoses:  Frequent headaches  Atypical chest pain    Rx / DC Orders ED Discharge Orders    None       Janet Berlin 05/17/20 1250    Arnaldo Natal, MD 05/17/20 [phone]

## 2020-05-30 ENCOUNTER — Other Ambulatory Visit: Payer: Self-pay | Admitting: Internal Medicine

## 2020-05-30 DIAGNOSIS — Z1231 Encounter for screening mammogram for malignant neoplasm of breast: Secondary | ICD-10-CM

## 2020-05-31 ENCOUNTER — Ambulatory Visit: Payer: Medicaid Other | Admitting: Cardiology

## 2020-06-03 ENCOUNTER — Ambulatory Visit: Payer: Medicaid Other | Admitting: Student

## 2020-06-03 ENCOUNTER — Other Ambulatory Visit: Payer: Self-pay

## 2020-06-03 ENCOUNTER — Encounter: Payer: Self-pay | Admitting: Student

## 2020-06-03 VITALS — BP 123/83 | HR 93 | Temp 97.7°F | Ht 61.0 in | Wt 213.6 lb

## 2020-06-03 DIAGNOSIS — I10 Essential (primary) hypertension: Secondary | ICD-10-CM

## 2020-06-03 DIAGNOSIS — R072 Precordial pain: Secondary | ICD-10-CM

## 2020-06-03 NOTE — Patient Instructions (Signed)
Echocardiogram  CT coronary calcium score  Exercise treadmill stress

## 2020-06-03 NOTE — Progress Notes (Signed)
Primary Physician/Referring:  Trey Sailors, PA  Patient ID: Briana Wade, female    DOB: [birth year], 50 y.o.   MRN: [id number]  Chief Complaint  Patient presents with  . Chest Pain  . Palpitations  . Coronary Artery Disease   HPI:    Briana Wade  is a 50 y.o. obese AA female with history of hypertension, asthma.  Denies family history of premature CAD, diabetes, hyperlipidemia, TIA/CVA, prior MI/DVT/PE.  Denies history of tobacco use.  She was referred to our office by PCP, Briana Number, PA-C, for evaluation of precordial pain.  Patient reports over the last 2-3 months she has noticed intermittent episodes of chest discomfort which she describes as pressure in the center of her chest.  She reports no identifiable triggers, discomfort has occurred both at rest and with exertion.  Patient states symptoms very significantly lasting anywhere from several minutes to 2-3 days.  Denies associated symptoms.  Denies dyspnea, palpitations, syncope, near syncope, dizziness, leg swelling, orthopnea, PND.   Notably patient has no formal exercise routine, and does not work.  She is therefore very inactive.  He also admits to poor diet high in salt and fat.  Patient states she eats out for most meals.   Notably patient is presently without insurance coverage and therefore express concern in paying for healthcare.  She reports it is presently a significant struggle for her to afford visits to provider offices and further testing.  Past Medical History:  Diagnosis Date  . Abdominal pain, unspecified site   . Allergy   . Anemia   . Asthma   . Chlamydia   . Diverticulitis   . Esophageal reflux   . Hiatal hernia   . Hypertension   . IBS (irritable bowel syndrome)   . Insomnia   . Lactose intolerance   . Migraine equivalent 08/2014   minor right arm weakness in [facility]. neg CVA on imaging.   . Other constipation   . Personal history of colonic polyps 09/12/2010   tubular adenoma  .  Trichimoniasis   . Tubular adenoma of colon   . Umbilical hernia   . Uterine fibroid   . Uterine leiomyoma    Past Surgical History:  Procedure Laterality Date  . APPENDECTOMY    . COLONOSCOPY    . DILATION AND CURETTAGE OF UTERUS     x2 for EAB  . HYSTEROSCOPY WITH NOVASURE N/A 12/17/2015   Procedure: HYSTEROSCOPY WITH NOVASURE;  Surgeon: Aletha Halim, MD;  Location: [facility];  Service: Gynecology;  Laterality: N/A;  . TUBAL LIGATION     Family History  Problem Relation Age of Onset  . Hypertension Mother   . Cancer Mother        not sure of type  . Asthma Cousin   . Asthma Son   . Colon cancer Neg Hx   . Esophageal cancer Neg Hx   . Rectal cancer Neg Hx   . Stomach cancer Neg Hx     Social History   Tobacco Use  . Smoking status: Never Smoker  . Smokeless tobacco: Never Used  Substance Use Topics  . Alcohol use: No   Marital Status: Single   ROS  Review of Systems  Constitutional: Negative for malaise/fatigue and weight gain.  Cardiovascular: Positive for chest pain (pressure). Negative for claudication, leg swelling, near-syncope, orthopnea, palpitations, paroxysmal nocturnal dyspnea and syncope.  Respiratory: Negative for shortness of breath.   Hematologic/Lymphatic: Does not bruise/bleed easily.  Gastrointestinal: Negative for  melena.  Neurological: Negative for dizziness and weakness.    Objective  Blood pressure 123/83, pulse 93, temperature 97.7 F (36.5 C), height 5\' 1"  (1.549 m), weight 213 lb 9.6 oz (96.9 kg), SpO2 97 %.  Vitals with BMI 06/03/2020 05/13/2020 05/13/2020  Height 5\' 1"  - -  Weight 213 lbs 10 oz - -  BMI [id number] - -  Systolic [id number]  Diastolic 83 91 87  Pulse 93 73 94      Physical Exam Vitals reviewed.  Constitutional:      Appearance: She is obese.  HENT:     Head: Normocephalic and atraumatic.  Neck:     Vascular: No carotid bruit.  Cardiovascular:     Rate and Rhythm: Normal rate and regular rhythm.     Pulses: Intact  distal pulses.          Carotid pulses are 2+ on the right side and 2+ on the left side.      Radial pulses are 2+ on the right side and 2+ on the left side.       Popliteal pulses are 2+ on the right side and 2+ on the left side.       Dorsalis pedis pulses are 2+ on the right side and 2+ on the left side.       Posterior tibial pulses are 2+ on the right side and 2+ on the left side.     Heart sounds: S1 normal and S2 normal. No murmur heard. No gallop.      Comments: Femoroplasty difficult to evaluate secondary to patient body habitus. Pulmonary:     Effort: Pulmonary effort is normal. No respiratory distress.     Breath sounds: No wheezing, rhonchi or rales.  Abdominal:     General: Bowel sounds are normal. There is no distension.     Palpations: Abdomen is soft.  Musculoskeletal:     Right lower leg: No edema.     Left lower leg: No edema.  Skin:    General: Skin is warm and dry.  Neurological:     Mental Status: She is alert.     Laboratory examination:   Recent Labs    03/25/20 1729 03/26/20 1047 03/26/20 1319 05/13/20 1515  NA 138 138  --  134*  K 3.8 5.4* 4.6 4.2  CL 102 103  --  99  CO2 25 26  --  27  GLUCOSE 111* 114*  --  93  BUN 12 14  --  14  CREATININE 0.87 1.12*  --  0.81  CALCIUM 9.6 9.3  --  8.9  GFRNONAA >60 60*  --  >60   CrCl cannot be calculated (Patient's most recent lab result is older than the maximum 21 days allowed.).  CMP Latest Ref Rng & Units 05/13/2020 03/26/2020 03/26/2020  Glucose 70 - 99 mg/dL 93 - 114(H)  BUN 6 - 20 mg/dL 14 - 14  Creatinine 0.44 - 1.00 mg/dL 0.81 - 1.12(H)  Sodium 135 - 145 mmol/L 134(L) - 138  Potassium 3.5 - 5.1 mmol/L 4.2 4.6 5.4(H)  Chloride 98 - 111 mmol/L 99 - 103  CO2 22 - 32 mmol/L 27 - 26  Calcium 8.9 - 10.3 mg/dL 8.9 - 9.3  Total Protein 6.5 - 8.1 g/dL - - -  Total Bilirubin 0.3 - 1.2 mg/dL - - -  Alkaline Phos 38 - 126 U/L - - -  AST 15 - 41 U/L - - -  ALT 0 -  44 U/L - - -   CBC Latest Ref Rng &  Units 05/13/2020 03/26/2020 03/25/2020  WBC 4.0 - 10.5 K/uL 5.4 4.7 5.9  Hemoglobin 12.0 - 15.0 g/dL 11.8(L) 12.3 12.8  Hematocrit 36.0 - 46.0 % 37.2 40.6 41.7  Platelets 150 - 400 K/uL 305 246 279    Lipid Panel No results for input(s): CHOL, TRIG, LDLCALC, VLDL, HDL, CHOLHDL, LDLDIRECT in the last 8760 hours.  HEMOGLOBIN A1C Lab Results  Component Value Date   HGBA1C 5.4 03/26/2020   MPG 108.28 03/26/2020   TSH No results for input(s): TSH in the last 8760 hours.  External labs:  05/15/2020: Glucose 117, BUN 10, creatinine 0.95, GFR 70, sodium 139, potassium 3.4,  09/28/2018: Hemoglobin 12.6, hematocrit 37.0, MCV 84, platelet 2 A1c 5.2% Total cholesterol 183, triglycerides 107, HDL 62, LDL 102  Medications and allergies   Allergies  Allergen Reactions  . Lactose Intolerance (Gi)      Outpatient Medications Prior to Visit  Medication Sig Dispense Refill  . budesonide-formoterol (SYMBICORT) 160-4.5 MCG/ACT inhaler Inhale 2 puffs into the lungs as needed.    . gabapentin (NEURONTIN) 300 MG capsule Take 1 capsule by mouth 2 (two) times daily.    . metoprolol tartrate (LOPRESSOR) 25 MG tablet Take 25 mg by mouth 2 (two) times daily.    . potassium chloride (KLOR-CON) 10 MEQ tablet Take 10 mEq by mouth 2 (two) times daily.    Marland Kitchen GABAPENTIN PO Take by mouth.    . hydrochlorothiazide (HYDRODIURIL) 25 MG tablet Take 25 mg by mouth daily.    Marland Kitchen losartan (COZAAR) 50 MG tablet Take 50 mg by mouth daily.  1  . Multiple Vitamin (MULTIVITAMIN WITH MINERALS) TABS tablet Take 1 tablet by mouth daily.    . naproxen (NAPROSYN) 500 MG tablet Take 1 tablet (500 mg total) by mouth 2 (two) times daily. (Patient taking differently: Take 500 mg by mouth 2 (two) times daily as needed for mild pain.) 30 tablet 0  . ondansetron (ZOFRAN ODT) 8 MG disintegrating tablet Take 1 tablet (8 mg total) by mouth every 8 (eight) hours as needed for nausea or vomiting. (Patient not taking: No sig reported) 20 tablet  0   No facility-administered medications prior to visit.     Radiology:   No results found.  Cardiac Studies:   None   EKG:   06/03/2020: Sinus rhythm at a rate of 84 bpm.  Left axis, left anterior fascicular block. LVH. Poor R wave progression, cannot exclude anteroseptal infarct old.  Nonspecific T wave abnormality.  No evidence of underlying ischemia or injury pattern.  05/13/2020: Sinus rhythm at a rate of 62 bpm.  Normal axis.  LVH. Poor R wave progression, cannot exclude anteroseptal infarct old.  Nonspecific T wave abnormality.  Assessment     ICD-10-CM   1. Precordial pain  R07.2 EKG 12-Lead  2. Essential hypertension  I10      Medications Discontinued During This Encounter  Medication Reason  . GABAPENTIN PO Duplicate  . losartan (COZAAR) 50 MG tablet Error  . naproxen (NAPROSYN) 500 MG tablet Error  . ondansetron (ZOFRAN ODT) 8 MG disintegrating tablet Error  . Multiple Vitamin (MULTIVITAMIN WITH MINERALS) TABS tablet Error  . hydrochlorothiazide (HYDRODIURIL) 25 MG tablet Error    No orders of the defined types were placed in this encounter.   Recommendations:   DEZAREE TRACEY is a 50 y.o. obese AA female with history of hypertension, asthma.  Denies family history of  premature CAD, diabetes, hyperlipidemia, TIA/CVA, prior MI/DVT/PE.  Denies history of tobacco use.  She was referred to our office by PCP, Briana Number, PA-C, for evaluation of precordial pain.  Patient symptoms of precordial pain are atypical, however she does have underlying cardiovascular risk factors including obesity and hypertension.  EKG today reveals no evidence of ischemia or underlying injury pattern, therefore condition for ACS is low.  However review of EKG revealing LVH as well as patient's ongoing symptoms over the last 2-3 months would recommend proceeding with echocardiogram.  In view of underlying cardiovascular risk factors would also recommend further restratification regarding  CAD.  Discussed with patient option of proceeding with exercise treadmill stress test versus coronary calcium score, or both.  Patient does wish to proceed with further cardiovascular testing, however she states she is unable to do so at this point due to financial constraints and lack of insurance.  I have provided patient with recommended testing including coronary calcium score, echocardiogram, and treadmill stress test.  Advised patient to follow-up with caseworker who is assisting her in obtaining Medicaid coverage.  Not make changes to patient's medications at this time as blood pressure is well controlled.  However last lipid profile testing available for review was from 2020, will defer repeat lipid profile testing to patient's PCP.  However if this has not been done in the next 6 months would recommend obtaining lipid profile.   Discussed with patient at length regarding the importance of proceeding with further cardiovascular testing and counseled her regarding signs and symptoms that would warrant urgent or emergent evaluation, she verbalized understanding agreement.  Also discussed at length with patient regarding diet and lifestyle modifications, including weight loss, to reduce cardiovascular risk and improve overall health.  Followed up in 8 weeks, sooner if needed, for precordial pain, hypertension, and cardiovascular risk factor management.   Briana Berthold, PA-C 06/03/2020, 4:10 PM Office: [phone]

## 2020-06-05 ENCOUNTER — Other Ambulatory Visit: Payer: Self-pay | Admitting: Physician Assistant

## 2020-06-05 ENCOUNTER — Other Ambulatory Visit: Payer: Self-pay

## 2020-06-05 DIAGNOSIS — N6320 Unspecified lump in the left breast, unspecified quadrant: Secondary | ICD-10-CM

## 2020-06-05 DIAGNOSIS — N644 Mastodynia: Secondary | ICD-10-CM

## 2020-06-18 ENCOUNTER — Other Ambulatory Visit: Payer: Medicaid Other

## 2020-06-18 ENCOUNTER — Ambulatory Visit: Payer: Medicaid Other

## 2020-06-25 ENCOUNTER — Ambulatory Visit: Payer: Medicaid Other | Admitting: *Deleted

## 2020-06-25 ENCOUNTER — Other Ambulatory Visit: Payer: Self-pay

## 2020-06-25 VITALS — BP 146/98 | Wt 213.8 lb

## 2020-06-25 DIAGNOSIS — N644 Mastodynia: Secondary | ICD-10-CM

## 2020-06-25 DIAGNOSIS — Z1239 Encounter for other screening for malignant neoplasm of breast: Secondary | ICD-10-CM

## 2020-06-25 NOTE — Progress Notes (Signed)
[name] is a 50 y.o. female who presents to [facility] clinic today with complaint of left outer breast pain and lump x 3-4 weeks. Patient states the pain comes and goes. Patient rates the pain at a 8-9 out of 10..    Pap Smear: Pap smear not completed today. Last Pap smear was 11/04/2015 at [facility] and was normal with negative HPV. Per patient has no history of an abnormal Pap smear. Last Pap smear result is available in Epic.   Physical exam: Breasts Breasts symmetrical. No skin abnormalities bilateral breasts. No nipple retraction bilateral breasts. No nipple discharge bilateral breasts. No lymphadenopathy. No lumps palpated bilateral breasts. Complaints of left outer and axillary breast pain on exam.      Pelvic/Bimanual Pap is not indicated today per BCCCP guidelines.   Smoking History: Patient has never smoked.   Patient Navigation: Patient education provided. Access to services provided for patient through [facility] program.   Colorectal Cancer Screening: Per patient has had colonoscopy completed on 02/23/2016 at [facility]. No complaints today.    Breast and Cervical Cancer Risk Assessment: Patient does not have family history of breast cancer, known genetic mutations, or radiation treatment to the chest before age 71. Patient does not have history of cervical dysplasia, immunocompromised, or DES exposure in-utero.  Risk Assessment    Risk Scores      06/25/2020   Last edited by: Demetrius Revel, LPN   5-year risk: 1.1 %   Lifetime risk: 8.7 %          A: BCCCP exam without pap smear Complaint of left breast lump and pain.  P: Referred patient to the [facility] for a diagnostic mammogram. Appointment scheduled Thursday, July 04, 2020 at 1520.  Loletta Parish, RN 06/25/2020 2:46 PM

## 2020-06-25 NOTE — Patient Instructions (Addendum)
Explained breast self awareness with Briana Wade. Patient did not need a Pap smear today due to last Pap smear and HPV typing was 11/04/2015. Let her know BCCCP will cover Pap smears and HPV typing every 5 years unless has a history of abnormal Pap smears. Referred patient to the [facility] [place] for a diagnostic mammogram. Appointment scheduled Thursday, July 04, 2020 at 1520. Patient aware of appointment and will be there. Briana Wade verbalized understanding.  Weslie Pretlow, Arvil Chaco, RN 2:47 PM

## 2020-07-02 ENCOUNTER — Ambulatory Visit: Payer: Medicaid Other

## 2020-07-04 ENCOUNTER — Ambulatory Visit
Admission: RE | Admit: 2020-07-04 | Discharge: 2020-07-04 | Disposition: A | Discharge status: Home / Self Care | Payer: Medicaid Other | Source: Ambulatory Visit | Attending: Obstetrics and Gynecology | Admitting: Obstetrics and Gynecology

## 2020-07-04 ENCOUNTER — Other Ambulatory Visit: Payer: Self-pay

## 2020-07-04 DIAGNOSIS — N644 Mastodynia: Secondary | ICD-10-CM

## 2020-07-04 DIAGNOSIS — N6320 Unspecified lump in the left breast, unspecified quadrant: Secondary | ICD-10-CM

## 2020-08-05 ENCOUNTER — Other Ambulatory Visit: Payer: Self-pay

## 2020-08-05 ENCOUNTER — Ambulatory Visit: Payer: Medicaid Other | Admitting: Student

## 2020-08-05 ENCOUNTER — Encounter: Payer: Self-pay | Admitting: Student

## 2020-08-05 VITALS — BP 137/91 | HR 83 | Temp 97.2°F | Ht 61.0 in | Wt 214.0 lb

## 2020-08-05 DIAGNOSIS — I10 Essential (primary) hypertension: Secondary | ICD-10-CM

## 2020-08-05 DIAGNOSIS — R072 Precordial pain: Secondary | ICD-10-CM

## 2020-08-05 MED ORDER — AMLODIPINE BESYLATE 5 MG PO TABS: 5.0000 mg | ORAL_TABLET | Freq: Every day | ORAL | 3 refills | Status: DC

## 2020-08-05 NOTE — Progress Notes (Signed)
Primary Physician/Referring:  Trey Sailors, PA  Patient ID: Briana Wade, female    DOB: [birth year], 50 y.o.   MRN: [id number]  Chief Complaint  Patient presents with   Chest Pain   Follow-up   HPI:    UMAIZA Wade  is a 50 y.o. obese AA female with history of hypertension, asthma.  Denies family history of premature CAD, diabetes, hyperlipidemia, TIA/CVA, prior MI/DVT/PE.  Denies history of tobacco use.  She was referred to our office by PCP, Raelyn Number, PA-C, for evaluation of precordial pain.  Patient presents for 8 week follow up of precordial pain, hypertension, and cardiovascular risk management. At last visit recommended further cardiac testing including ischemic evaluation, however due to financial constraints and no health insurance, patient had opted to hold off on further testing despite the risks.  Patient unfortunately still does not have insurance coverage, however she does continue to have episodes of precordial pain.  Symptoms are unchanged since last visit.  Denies dyspnea, palpitations, syncope, near syncope, dizziness, leg swelling, orthopnea, PND.  Past Medical History:  Diagnosis Date   Abdominal pain, unspecified site    Allergy    Anemia    Asthma    Chlamydia    Diverticulitis    Esophageal reflux    Hiatal hernia    Hypertension    IBS (irritable bowel syndrome)    Insomnia    Lactose intolerance    Migraine equivalent 08/2014   minor right arm weakness in [facility]. neg CVA on imaging.    Other constipation    Personal history of colonic polyps 09/12/2010   tubular adenoma   Trichimoniasis    Tubular adenoma of colon    Umbilical hernia    Uterine fibroid    Uterine leiomyoma    Past Surgical History:  Procedure Laterality Date   APPENDECTOMY     COLONOSCOPY     DILATION AND CURETTAGE OF UTERUS     x2 for EAB   HYSTEROSCOPY WITH NOVASURE N/A 12/17/2015   Procedure: HYSTEROSCOPY WITH NOVASURE;  Surgeon: Aletha Halim, MD;   Location: [facility];  Service: Gynecology;  Laterality: N/A;   TUBAL LIGATION     Family History  Problem Relation Age of Onset   Hypertension Mother    Cancer Mother        not sure of type   Asthma Cousin    Asthma Son    Colon cancer Neg Hx    Esophageal cancer Neg Hx    Rectal cancer Neg Hx    Stomach cancer Neg Hx     Social History   Tobacco Use   Smoking status: Never   Smokeless tobacco: Never  Substance Use Topics   Alcohol use: No   Marital Status: Single   ROS  Review of Systems  Constitutional: Negative for malaise/fatigue and weight gain.  Cardiovascular:  Positive for chest pain (pressure). Negative for claudication, leg swelling, near-syncope, orthopnea, palpitations, paroxysmal nocturnal dyspnea and syncope.  Respiratory:  Negative for shortness of breath.   Hematologic/Lymphatic: Does not bruise/bleed easily.  Gastrointestinal:  Negative for melena.  Neurological:  Negative for dizziness and weakness.   Objective  Blood pressure (!) 137/91, pulse 83, temperature (!) 97.2 F (36.2 C), temperature source Temporal, height 5\' 1"  (1.549 m), weight 214 lb (97.1 kg), SpO2 96 %.  Vitals with BMI 08/05/2020 06/25/2020 06/03/2020  Height 5\' 1"  - 5\' 1"   Weight 214 lbs 213 lbs 13 oz 213 lbs 10 oz  BMI 65.78 - 46.96  Systolic 295 284 132  Diastolic 91 98 83  Pulse 83 - 93      Physical Exam Vitals reviewed.  Constitutional:      Appearance: She is obese.  Neck:     Vascular: No carotid bruit.  Cardiovascular:     Rate and Rhythm: Normal rate and regular rhythm.     Pulses: Intact distal pulses.          Carotid pulses are 2+ on the right side and 2+ on the left side.      Radial pulses are 2+ on the right side and 2+ on the left side.       Popliteal pulses are 2+ on the right side and 2+ on the left side.       Dorsalis pedis pulses are 2+ on the right side and 2+ on the left side.       Posterior tibial pulses are 2+ on the right side and 2+ on the left side.      Heart sounds: S1 normal and S2 normal. No murmur heard.   No gallop.     Comments: Femoral pulse difficult to evaluate secondary to patient body habitus. Pulmonary:     Effort: Pulmonary effort is normal. No respiratory distress.     Breath sounds: No wheezing, rhonchi or rales.  Musculoskeletal:     Right lower leg: No edema.     Left lower leg: No edema.  Neurological:     Mental Status: She is alert.    Laboratory examination:   Recent Labs    03/25/20 1729 03/26/20 1047 03/26/20 1319 05/13/20 1515  NA 138 138  --  134*  K 3.8 5.4* 4.6 4.2  CL 102 103  --  99  CO2 25 26  --  27  GLUCOSE 111* 114*  --  93  BUN 12 14  --  14  CREATININE 0.87 1.12*  --  0.81  CALCIUM 9.6 9.3  --  8.9  GFRNONAA >60 60*  --  >60   CrCl cannot be calculated (Patient's most recent lab result is older than the maximum 21 days allowed.).  CMP Latest Ref Rng & Units 05/13/2020 03/26/2020 03/26/2020  Glucose 70 - 99 mg/dL 93 - 114(H)  BUN 6 - 20 mg/dL 14 - 14  Creatinine 0.44 - 1.00 mg/dL 0.81 - 1.12(H)  Sodium 135 - 145 mmol/L 134(L) - 138  Potassium 3.5 - 5.1 mmol/L 4.2 4.6 5.4(H)  Chloride 98 - 111 mmol/L 99 - 103  CO2 22 - 32 mmol/L 27 - 26  Calcium 8.9 - 10.3 mg/dL 8.9 - 9.3  Total Protein 6.5 - 8.1 g/dL - - -  Total Bilirubin 0.3 - 1.2 mg/dL - - -  Alkaline Phos 38 - 126 U/L - - -  AST 15 - 41 U/L - - -  ALT 0 - 44 U/L - - -   CBC Latest Ref Rng & Units 05/13/2020 03/26/2020 03/25/2020  WBC 4.0 - 10.5 K/uL 5.4 4.7 5.9  Hemoglobin 12.0 - 15.0 g/dL 11.8(L) 12.3 12.8  Hematocrit 36.0 - 46.0 % 37.2 40.6 41.7  Platelets 150 - 400 K/uL 305 246 279    Lipid Panel No results for input(s): CHOL, TRIG, LDLCALC, VLDL, HDL, CHOLHDL, LDLDIRECT in the last 8760 hours.  HEMOGLOBIN A1C Lab Results  Component Value Date   HGBA1C 5.4 03/26/2020   MPG 108.28 03/26/2020   TSH No results for input(s): TSH in the  last 8760 hours.  External labs:  05/15/2020: Glucose 117, BUN 10, creatinine  0.95, GFR 70, sodium 139, potassium 3.4,  09/28/2018: Hemoglobin 12.6, hematocrit 37.0, MCV 84, platelet 2 A1c 5.2% Total cholesterol 183, triglycerides 107, HDL 62, LDL 102  Allergies   Allergies  Allergen Reactions   Lactose Intolerance (Gi)       Medications Prior to Visit:   Outpatient Medications Prior to Visit  Medication Sig Dispense Refill   metoprolol tartrate (LOPRESSOR) 25 MG tablet Take 25 mg by mouth 2 (two) times daily.     budesonide-formoterol (SYMBICORT) 160-4.5 MCG/ACT inhaler Inhale 2 puffs into the lungs as needed.     gabapentin (NEURONTIN) 300 MG capsule Take 1 capsule by mouth 2 (two) times daily.     potassium chloride (KLOR-CON) 10 MEQ tablet Take 10 mEq by mouth 2 (two) times daily.     No facility-administered medications prior to visit.     Final Medications at End of Visit    Current Meds  Medication Sig   amLODipine (NORVASC) 5 MG tablet Take 1 tablet (5 mg total) by mouth daily.   metoprolol tartrate (LOPRESSOR) 25 MG tablet Take 25 mg by mouth 2 (two) times daily.   [DISCONTINUED] budesonide-formoterol (SYMBICORT) 160-4.5 MCG/ACT inhaler Inhale 2 puffs into the lungs as needed.   [DISCONTINUED] gabapentin (NEURONTIN) 300 MG capsule Take 1 capsule by mouth 2 (two) times daily.   [DISCONTINUED] potassium chloride (KLOR-CON) 10 MEQ tablet Take 10 mEq by mouth 2 (two) times daily.   Radiology:   No results found.  Cardiac Studies:   LE DVT study 03/05/2020:  RIGHT:  - No evidence of common femoral vein obstruction.  LEFT:  - There is no evidence of deep vein thrombosis in the lower extremity.  - No cystic structure found in the popliteal fossa.  EKG:   06/03/2020: Sinus rhythm at a rate of 84 bpm.  Left axis, left anterior fascicular block. LVH. Poor R wave progression, cannot exclude anteroseptal infarct old.  Nonspecific T wave abnormality.  No evidence of underlying ischemia or injury pattern.  05/13/2020: Sinus rhythm at a rate of 62  bpm.  Normal axis.  LVH. Poor R wave progression, cannot exclude anteroseptal infarct old.  Nonspecific T wave abnormality.  Assessment     ICD-10-CM   1. Precordial pain  R07.2 CT CARDIAC SCORING ([facility] LOCATIONS ONLY)    [facility] ECHOCARDIOGRAM COMPLETE    2. Essential hypertension  I10        Medications Discontinued During This Encounter  Medication Reason   potassium chloride (KLOR-CON) 10 MEQ tablet Error   gabapentin (NEURONTIN) 300 MG capsule Error   budesonide-formoterol (SYMBICORT) 160-4.5 MCG/ACT inhaler Error    Meds ordered this encounter  Medications   amLODipine (NORVASC) 5 MG tablet    Sig: Take 1 tablet (5 mg total) by mouth daily.    Dispense:  30 tablet    Refill:  3    Recommendations:   AMILLIA BIFFLE is a 50 y.o. obese AA female with history of hypertension, asthma.  Denies family history of premature CAD, diabetes, hyperlipidemia, TIA/CVA, prior MI/DVT/PE.  Denies history of tobacco use.  She was referred to our office by PCP, Raelyn Number, PA-C, for evaluation of precordial pain.  Patient presents for 8 week follow up of precordial pain, hypertension, and cardiovascular risk management. At last visit recommended further cardiac testing including ischemic evaluation, however due to financial constraints and no health insurance, patient had opted to hold  off on further testing despite the risks.  Given patient's ongoing symptoms she is now willing to proceed with coronary calcium score and echocardiogram.  She will work with our office as well as working on Print production planner to schedule echocardiogram, particularly given EKG concerning for LVH.  Patient's blood pressure is uncontrolled in the office today, will therefore add amlodipine 5 mg once daily both for blood pressure management as well as antianginal benefit.  Follow-up in 3 months, sooner if needed, for precordial pain, hypertension, and results of cardiac testing.   Alethia Berthold,  PA-C 08/05/2020, 4:44 PM Office: [phone]

## 2020-08-08 ENCOUNTER — Other Ambulatory Visit: Payer: Self-pay

## 2020-08-08 ENCOUNTER — Encounter (HOSPITAL_BASED_OUTPATIENT_CLINIC_OR_DEPARTMENT_OTHER): Payer: Self-pay | Admitting: *Deleted

## 2020-08-08 ENCOUNTER — Emergency Department (HOSPITAL_BASED_OUTPATIENT_CLINIC_OR_DEPARTMENT_OTHER)
Admission: EM | Admit: 2020-08-08 | Discharge: 2020-08-08 | Disposition: A | Discharge status: Home / Self Care | Payer: Medicaid Other | Attending: Emergency Medicine | Admitting: Emergency Medicine

## 2020-08-08 DIAGNOSIS — Z20822 Contact with and (suspected) exposure to covid-19: Secondary | ICD-10-CM | POA: Insufficient documentation

## 2020-08-08 DIAGNOSIS — H6121 Impacted cerumen, right ear: Secondary | ICD-10-CM | POA: Insufficient documentation

## 2020-08-08 DIAGNOSIS — J45909 Unspecified asthma, uncomplicated: Secondary | ICD-10-CM | POA: Insufficient documentation

## 2020-08-08 DIAGNOSIS — R519 Headache, unspecified: Secondary | ICD-10-CM

## 2020-08-08 DIAGNOSIS — Z8669 Personal history of other diseases of the nervous system and sense organs: Secondary | ICD-10-CM | POA: Insufficient documentation

## 2020-08-08 DIAGNOSIS — Z79899 Other long term (current) drug therapy: Secondary | ICD-10-CM | POA: Insufficient documentation

## 2020-08-08 DIAGNOSIS — I10 Essential (primary) hypertension: Secondary | ICD-10-CM | POA: Insufficient documentation

## 2020-08-08 LAB — BASIC METABOLIC PANEL
Anion gap: 4 — ABNORMAL LOW (ref 5–15)
BUN: 14 mg/dL (ref 6–20)
CO2: 32 mmol/L (ref 22–32)
Calcium: 9 mg/dL (ref 8.9–10.3)
Chloride: 103 mmol/L (ref 98–111)
Creatinine, Ser: 0.75 mg/dL (ref 0.44–1.00)
GFR, Estimated: >60 mL/min (ref >60)
Glucose, Bld: 107 mg/dL — ABNORMAL HIGH (ref 70–99)
Potassium: 4 mmol/L (ref 3.5–5.1)
Sodium: 139 mmol/L (ref 135–145)

## 2020-08-08 LAB — CBC WITH DIFFERENTIAL/PLATELET
Abs Immature Granulocytes: 0.04 10*3/uL (ref 0.00–0.07)
Basophils Absolute: 0 10*3/uL (ref 0.0–0.1)
Basophils Relative: 0 %
Eosinophils Absolute: 0.2 10*3/uL (ref 0.0–0.5)
Eosinophils Relative: 3 %
HCT: 38.5 % (ref 36.0–46.0)
Hemoglobin: 12.2 g/dL (ref 12.0–15.0)
Immature Granulocytes: 1 %
Lymphocytes Relative: 25 %
Lymphs Abs: 2 10*3/uL (ref 0.7–4.0)
MCH: 27.2 pg (ref 26.0–34.0)
MCHC: 31.7 g/dL (ref 30.0–36.0)
MCV: 85.7 fL (ref 80.0–100.0)
Monocytes Absolute: 0.5 10*3/uL (ref 0.1–1.0)
Monocytes Relative: 6 %
Neutro Abs: 5.3 10*3/uL (ref 1.7–7.7)
Neutrophils Relative %: 65 %
Platelets: 290 10*3/uL (ref 150–400)
RBC: 4.49 MIL/uL (ref 3.87–5.11)
RDW: 14.1 % (ref 11.5–15.5)
WBC: 8 10*3/uL (ref 4.0–10.5)
nRBC: 0 % (ref 0.0–0.2)

## 2020-08-08 LAB — RESP PANEL BY RT-PCR (FLU A&B, COVID) ARPGX2
Influenza A by PCR: NEGATIVE
Influenza B by PCR: NEGATIVE
SARS Coronavirus 2 by RT PCR: NEGATIVE

## 2020-08-08 MED ORDER — METOCLOPRAMIDE HCL 5 MG/ML IJ SOLN
10.0000 mg | Freq: Once | INTRAMUSCULAR | Status: AC
  Administered 2020-08-08: 10 mg via INTRAVENOUS
  Filled 2020-08-08: qty 2

## 2020-08-08 MED ORDER — KETOROLAC TROMETHAMINE 30 MG/ML IJ SOLN
30.0000 mg | Freq: Once | INTRAMUSCULAR | Status: AC
  Administered 2020-08-08: 30 mg via INTRAVENOUS
  Filled 2020-08-08: qty 1

## 2020-08-08 MED ORDER — DIPHENHYDRAMINE HCL 50 MG/ML IJ SOLN
25.0000 mg | Freq: Once | INTRAMUSCULAR | Status: AC
  Administered 2020-08-08: 25 mg via INTRAVENOUS
  Filled 2020-08-08: qty 1

## 2020-08-08 MED ORDER — METOCLOPRAMIDE HCL 10 MG PO TABS: 10.0000 mg | ORAL_TABLET | Freq: Four times a day (QID) | ORAL | 0 refills | Status: DC

## 2020-08-08 MED ORDER — IBUPROFEN 600 MG PO TABS: 600.0000 mg | ORAL_TABLET | Freq: Four times a day (QID) | ORAL | 0 refills | Status: DC | PRN

## 2020-08-08 MED ORDER — DIPHENHYDRAMINE HCL 25 MG PO TABS: 25.0000 mg | ORAL_TABLET | Freq: Four times a day (QID) | ORAL | 0 refills | Status: DC | PRN

## 2020-08-08 NOTE — ED Triage Notes (Signed)
C/o h/a x 3 days

## 2020-08-08 NOTE — Discharge Instructions (Addendum)
1.  Your headaches may be migraine headaches.  Is important that this get further evaluated by a neurologist.  You have been given a referral to set an appointment with [facility]. 2.  In the meantime, you may take a combination of ibuprofen 600 mg, Reglan 10 mg and Benadryl 25 mg when you have a headache.  Rest in a dark room.  Stay hydrated.  Try to avoid any activities that you know bring on your headaches. 3.  Return to emergency department immediately if you are developing other concerning symptoms such as problems with your vision, fevers, confusion, weakness or numbness of an extremity or other concerning symptoms.

## 2020-08-08 NOTE — ED Provider Notes (Signed)
[facility] EMERGENCY DEPARTMENT Provider Note   CSN: [id number] Arrival date & time: 08/08/20  1718     History Chief Complaint  Patient presents with   Headache    Briana Wade is a 50 y.o. female.  HPI Patient reports has been getting intermittent headaches for several months at least.  She reports they come and go.  She reports a headache she has today started sometime yesterday.  She does not member specifically when.  She however reports that she has had a lot of pressure and pain both on the temples and behind her forehead.  Nothing at home has been helpful.  She denies visual changes.  No nausea no vomiting.  No focal weakness numbness or tingling.  No gait instability.  Patient denies she had fever chills or neck pain.  She reports she has had headaches in the past but does not think she has a history of migraines.  Family history is negative for brain tumors or aneurysms.  Patient has 2 siblings without significant medical problems.  She reports her mother died last year of pancreatic cancer.  Father living.    Past Medical History:  Diagnosis Date   Abdominal pain, unspecified site    Allergy    Anemia    Asthma    Chlamydia    Diverticulitis    Esophageal reflux    Hiatal hernia    Hypertension    IBS (irritable bowel syndrome)    Insomnia    Lactose intolerance    Migraine equivalent 08/2014   minor right arm weakness in [facility]. neg CVA on imaging.    Other constipation    Personal history of colonic polyps 09/12/2010   tubular adenoma   Trichimoniasis    Tubular adenoma of colon    Umbilical hernia    Uterine fibroid    Uterine leiomyoma     Patient Active Problem List   Diagnosis Date Noted   Diverticulosis of sigmoid colon 04/27/2016   Acute diverticulitis of intestine 04/27/2016   Diverticula of colon 05/21/2015   Asthma 05/21/2015   Acute diverticulitis 05/21/2015   Diverticulitis of sigmoid colon 06/02/2011   Nausea and vomiting  06/02/2011   Otitis 06/02/2011   HTN (hypertension) 06/02/2011   GERD (gastroesophageal reflux disease) 10/21/2010   Gluten intolerance 10/21/2010   Personal history of colonic polyps 10/21/2010   Flatulence, eructation, and gas pain 09/12/2010   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   Irritable bowel syndrome 09/12/2010   Abdominal pain 08/26/2010   Bloating 08/26/2010   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   IBS (irritable bowel syndrome) 08/26/2010   CONSTIPATION, CHRONIC 05/11/2007   ABDOMINAL PAIN, CHRONIC 05/11/2007    Past Surgical History:  Procedure Laterality Date   APPENDECTOMY     COLONOSCOPY     DILATION AND CURETTAGE OF UTERUS     x2 for EAB   HYSTEROSCOPY WITH NOVASURE N/A 12/17/2015   Procedure: HYSTEROSCOPY WITH NOVASURE;  Surgeon: Aletha Halim, MD;  Location: [facility];  Service: Gynecology;  Laterality: N/A;   TUBAL LIGATION       OB History     Gravida  7   Para  3   Term  3   Preterm      AB  4   Living  3      SAB  1   IAB  3   Ectopic      Multiple      Live Births  Obstetric Comments  svd x 3         Family History  Problem Relation Age of Onset   Hypertension Mother    Cancer Mother        not sure of type   Asthma Cousin    Asthma Son    Colon cancer Neg Hx    Esophageal cancer Neg Hx    Rectal cancer Neg Hx    Stomach cancer Neg Hx     Social History   Tobacco Use   Smoking status: Never   Smokeless tobacco: Never  Vaping Use   Vaping Use: Never used  Substance Use Topics   Alcohol use: No   Drug use: No    Home Medications Prior to Admission medications   Medication Sig Start Date End Date Taking? Authorizing Provider  amLODipine (NORVASC) 5 MG tablet Take 1 tablet (5 mg total) by mouth daily. 08/05/20 12/03/20  Cantwell, Celeste C, PA-C  metoprolol tartrate (LOPRESSOR) 25 MG tablet Take 25 mg by mouth 2 (two) times daily. 04/01/20   [provider]    Allergies    Lactose intolerance (gi)  Review of Systems   Review of Systems 10 systems reviewed and negative except as per HPI Physical Exam Updated Vital Signs BP (!) 145/80 (BP Location: Left Arm)   Pulse 94   Temp 98.6 F (37 C) (Oral)   Resp 18   Ht 5\' 1"  (1.549 m)   Wt 97.1 kg   LMP 01/31/2018   SpO2 100%   BMI 40.43 kg/m   Physical Exam Constitutional:      Appearance: Normal appearance.  HENT:     Head: Normocephalic and atraumatic.     Right Ear: Tympanic membrane normal.     Left Ear: Tympanic membrane normal.     Ears:     Comments: Mild amount of cerumen in the right ear canal.  No erythema or bulging of the TMs.    Nose: Nose normal.     Mouth/Throat:     Mouth: Mucous membranes are moist.     Pharynx: Oropharynx is clear.  Eyes:     Extraocular Movements: Extraocular movements intact.     Conjunctiva/sclera: Conjunctivae normal.     Pupils: Pupils are equal, round, and reactive to light.  Cardiovascular:     Rate and Rhythm: Normal rate and regular rhythm.  Pulmonary:     Effort: Pulmonary effort is normal.     Breath sounds: Normal breath sounds.  Abdominal:     General: There is no distension.     Palpations: Abdomen is soft.     Tenderness: There is no abdominal tenderness. There is no guarding.  Musculoskeletal:        General: No swelling or tenderness. Normal range of motion.     Cervical back: Neck supple.     Right lower leg: No edema.     Left lower leg: No edema.  Skin:    General: Skin is warm and dry.  Neurological:     General: No focal deficit present.     Mental Status: She is alert and oriented to person, place, and time.     Cranial Nerves: No cranial nerve deficit.     Motor: No weakness.     Coordination: Coordination normal.     Gait: Gait normal.  Psychiatric:        Mood and Affect: Mood normal.    ED Results / Procedures / Treatments  Labs (all labs ordered are listed, but only abnormal  results are displayed) Labs Reviewed  RESP PANEL BY RT-PCR (FLU A&B, COVID) ARPGX2  CBC WITH DIFFERENTIAL/PLATELET  BASIC METABOLIC PANEL    EKG None  Radiology No results found.  Procedures Procedures   Medications Ordered in ED Medications  ketorolac (TORADOL) 30 MG/ML injection 30 mg (has no administration in time range)  metoCLOPramide (REGLAN) injection 10 mg (has no administration in time range)  diphenhydrAMINE (BENADRYL) injection 25 mg (has no administration in time range)    ED Course  I have reviewed the triage vital signs and the nursing notes.  Pertinent labs & imaging results that were available during my care of the patient were reviewed by me and considered in my medical decision making (see chart for details).  Clinical Course as of 08/08/20 2219  Thu Aug 08, 2020  2218 Recheck: Patient reports headache is resolved. [MP]    Clinical Course User Index [MP] Charlesetta Shanks, MD   MDM Rules/Calculators/A&P                         Patient is clinically well in appearance.  She does have history of recurrent headaches.  Patient did have CT scan in April of this year without acute findings.  At this time headache is not of acute or sudden onset.  No fever or neck stiffness.  Low suspicion for subarachnoid hemorrhage, meningitis or other emergent headache condition.  Clinically patient is well with a normal neurologic exam.  We will proceed with treatment for migraine type headache with Reglan, Benadryl and Toradol.  After treatment with migraine cocktail, patient reports headache is resolved.  At this time she has been having episodic headaches without significant associated symptoms.  Patient has borderline hypertension without any visual disturbance, confusion or incoordination.  At this time have low suspicion for emergent headache etiology.  Due to persistence of headaches and recurrence, I do recommend follow-up with neurology for further evaluation and possible  migraine treatments.  Return precautions reviewed.  Final Clinical Impression(s) / ED Diagnoses Final diagnoses:  Nonintractable episodic headache, unspecified headache type    Rx / DC Orders ED Discharge Orders     None        Charlesetta Shanks, MD 08/08/20 2220

## 2020-08-27 ENCOUNTER — Other Ambulatory Visit: Payer: Medicaid Other

## 2020-09-03 ENCOUNTER — Inpatient Hospital Stay: Admission: RE | Admit: 2020-09-03 | Payer: Medicaid Other | Source: Ambulatory Visit

## 2020-09-07 ENCOUNTER — Emergency Department (HOSPITAL_COMMUNITY): Payer: Medicaid Other

## 2020-09-07 ENCOUNTER — Other Ambulatory Visit: Payer: Self-pay

## 2020-09-07 ENCOUNTER — Emergency Department (HOSPITAL_COMMUNITY)
Admission: EM | Admit: 2020-09-07 | Discharge: 2020-09-07 | Disposition: A | Discharge status: Home / Self Care | Payer: Medicaid Other | Attending: Emergency Medicine | Admitting: Emergency Medicine

## 2020-09-07 ENCOUNTER — Encounter (HOSPITAL_COMMUNITY): Payer: Self-pay | Admitting: Emergency Medicine

## 2020-09-07 DIAGNOSIS — I10 Essential (primary) hypertension: Secondary | ICD-10-CM | POA: Insufficient documentation

## 2020-09-07 DIAGNOSIS — R0789 Other chest pain: Secondary | ICD-10-CM | POA: Insufficient documentation

## 2020-09-07 DIAGNOSIS — Z79899 Other long term (current) drug therapy: Secondary | ICD-10-CM | POA: Insufficient documentation

## 2020-09-07 DIAGNOSIS — J45909 Unspecified asthma, uncomplicated: Secondary | ICD-10-CM | POA: Insufficient documentation

## 2020-09-07 DIAGNOSIS — R079 Chest pain, unspecified: Secondary | ICD-10-CM

## 2020-09-07 LAB — TROPONIN I (HIGH SENSITIVITY): Troponin I (High Sensitivity): <2 ng/L (ref <18)

## 2020-09-07 LAB — BASIC METABOLIC PANEL
Anion gap: 7 (ref 5–15)
BUN: 11 mg/dL (ref 6–20)
CO2: 29 mmol/L (ref 22–32)
Calcium: 9.5 mg/dL (ref 8.9–10.3)
Chloride: 103 mmol/L (ref 98–111)
Creatinine, Ser: 0.75 mg/dL (ref 0.44–1.00)
GFR, Estimated: >60 mL/min (ref >60)
Glucose, Bld: 85 mg/dL (ref 70–99)
Potassium: 4.7 mmol/L (ref 3.5–5.1)
Sodium: 139 mmol/L (ref 135–145)

## 2020-09-07 LAB — CBC
HCT: 36.9 % (ref 36.0–46.0)
Hemoglobin: 11.7 g/dL — ABNORMAL LOW (ref 12.0–15.0)
MCH: 27.1 pg (ref 26.0–34.0)
MCHC: 31.7 g/dL (ref 30.0–36.0)
MCV: 85.6 fL (ref 80.0–100.0)
Platelets: 282 10*3/uL (ref 150–400)
RBC: 4.31 MIL/uL (ref 3.87–5.11)
RDW: 14.2 % (ref 11.5–15.5)
WBC: 5.8 10*3/uL (ref 4.0–10.5)
nRBC: 0 % (ref 0.0–0.2)

## 2020-09-07 MED ORDER — ASPIRIN 81 MG PO CHEW
324.0000 mg | CHEWABLE_TABLET | Freq: Once | ORAL | Status: AC
  Administered 2020-09-07: 324 mg via ORAL
  Filled 2020-09-07: qty 4

## 2020-09-07 NOTE — ED Notes (Signed)
Patient given discharge instructions, all questions answered. Patient in possession of all belongings, directed to the discharge area  

## 2020-09-07 NOTE — ED Provider Notes (Signed)
[facility] DEPT Provider Note   CSN: [id number] Arrival date & time: 09/07/20  1134     History Chief Complaint  Patient presents with   Chest Pain    Briana Wade is a 50 y.o. female with PMHx HTN, GERD, asthma, who presents to the ED today with complaint of gradual onset, constant, dull/achy, left sided chest pain that began this morning.  Patient reports the pain woke her up out of her sleep around 8:15 AM has been constant since then.  She denies any associated symptoms including diaphoresis, shortness of breath, nausea, vomiting.  She does mention that a couple days ago she noticed a pain in her left upper arm however did not think much of it until she began having chest pain today.  She does report history of similar chest pain in the past however states that she has been seen for it and no answers provided for same.  When asked if she has followed up with a cardiologist patient states that she does not have insurance and has not been able to do so.  She denies any family history of CAD.  She is a never smoker.  She denies any risk factors today for DVT/PE.  She did not take anything for pain prior to arrival.  Per chart review patient has been seen by cardiology for precordial pain with reassuring echo.  There is an order for calcium scoring however appears that patient has not had this done yet.   The history is provided by the patient and medical records.   HPI: A 50 year old patient with a history of hypertension and obesity presents for evaluation of chest pain. Initial onset of pain was approximately 3-6 hours ago. The patient's chest pain is well-localized and is not worse with exertion. The patient's chest pain is middle- or left-sided, is not described as heaviness/pressure/tightness, is not sharp and does radiate to the arms/jaw/neck. The patient does not complain of nausea and denies diaphoresis. The patient has no history of stroke, has no  history of peripheral artery disease, has not smoked in the past 90 days, denies any history of treated diabetes, has no relevant family history of coronary artery disease (first degree relative at less than age 38) and has no history of hypercholesterolemia.   Past Medical History:  Diagnosis Date   Abdominal pain, unspecified site    Allergy    Anemia    Asthma    Chlamydia    Diverticulitis    Esophageal reflux    Hiatal hernia    Hypertension    IBS (irritable bowel syndrome)    Insomnia    Lactose intolerance    Migraine equivalent 08/2014   minor right arm weakness in [facility]. neg CVA on imaging.    Other constipation    Personal history of colonic polyps 09/12/2010   tubular adenoma   Trichimoniasis    Tubular adenoma of colon    Umbilical hernia    Uterine fibroid    Uterine leiomyoma     Patient Active Problem List   Diagnosis Date Noted   Diverticulosis of sigmoid colon 04/27/2016   Acute diverticulitis of intestine 04/27/2016   Diverticula of colon 05/21/2015   Asthma 05/21/2015   Acute diverticulitis 05/21/2015   Diverticulitis of sigmoid colon 06/02/2011   Nausea and vomiting 06/02/2011   Otitis 06/02/2011   HTN (hypertension) 06/02/2011   GERD (gastroesophageal reflux disease) 10/21/2010   Gluten intolerance 10/21/2010   Personal history of colonic  polyps 10/21/2010   Flatulence, eructation, and gas pain 09/12/2010   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   Irritable bowel syndrome 09/12/2010   Abdominal pain 08/26/2010   Bloating 08/26/2010   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   IBS (irritable bowel syndrome) 08/26/2010   CONSTIPATION, CHRONIC 05/11/2007   ABDOMINAL PAIN, CHRONIC 05/11/2007    Past Surgical History:  Procedure Laterality Date   APPENDECTOMY     COLONOSCOPY     DILATION AND CURETTAGE OF UTERUS     x2 for EAB   HYSTEROSCOPY WITH NOVASURE N/A 12/17/2015   Procedure: HYSTEROSCOPY  WITH NOVASURE;  Surgeon: Aletha Halim, MD;  Location: [facility];  Service: Gynecology;  Laterality: N/A;   TUBAL LIGATION       OB History     Gravida  7   Para  3   Term  3   Preterm      AB  4   Living  3      SAB  1   IAB  3   Ectopic      Multiple      Live Births           Obstetric Comments  svd x 3         Family History  Problem Relation Age of Onset   Hypertension Mother    Cancer Mother        not sure of type   Asthma Cousin    Asthma Son    Colon cancer Neg Hx    Esophageal cancer Neg Hx    Rectal cancer Neg Hx    Stomach cancer Neg Hx     Social History   Tobacco Use   Smoking status: Never   Smokeless tobacco: Never  Vaping Use   Vaping Use: Never used  Substance Use Topics   Alcohol use: No   Drug use: No    Home Medications Prior to Admission medications   Medication Sig Start Date End Date Taking? Authorizing Provider  amLODipine (NORVASC) 5 MG tablet Take 1 tablet (5 mg total) by mouth daily. 08/05/20 12/03/20 Yes Cantwell, Celeste C, PA-C  diphenhydrAMINE (BENADRYL) 25 MG tablet Take 1 tablet (25 mg total) by mouth every 6 (six) hours as needed. 08/08/20  Yes Charlesetta Shanks, MD  ibuprofen (ADVIL) 600 MG tablet Take 1 tablet (600 mg total) by mouth every 6 (six) hours as needed. 08/08/20  Yes Charlesetta Shanks, MD  metoprolol tartrate (LOPRESSOR) 25 MG tablet Take 25 mg by mouth 2 (two) times daily. 04/01/20  Yes [provider]  metoCLOPramide (REGLAN) 10 MG tablet Take 1 tablet (10 mg total) by mouth every 6 (six) hours. Patient not taking: Reported on 09/07/2020 08/08/20   Charlesetta Shanks, MD    Allergies    Lactose intolerance (gi)  Review of Systems   Review of Systems  Constitutional:  Negative for chills, diaphoresis and fever.  Respiratory:  Negative for shortness of breath.   Cardiovascular:  Positive for chest pain.  Gastrointestinal:  Negative for nausea and vomiting.  All other systems reviewed and are  negative.  Physical Exam Updated Vital Signs BP (!) 147/116   Pulse 69   Temp 98.6 F (37 C) (Oral)   Resp 19   Ht '5\' 1"'$  (1.549 m)   Wt 97.5 kg   LMP 03/26/2020 (Approximate)   SpO2 100%   BMI 40.62 kg/m   Physical Exam Vitals and nursing note reviewed.  Constitutional:  Appearance: She is obese. She is not ill-appearing or diaphoretic.  HENT:     Head: Normocephalic and atraumatic.  Eyes:     Conjunctiva/sclera: Conjunctivae normal.  Cardiovascular:     Rate and Rhythm: Normal rate and regular rhythm.     Pulses:          Radial pulses are 2+ on the right side and 2+ on the left side.     Heart sounds: Normal heart sounds.  Pulmonary:     Effort: Pulmonary effort is normal.     Breath sounds: Normal breath sounds. No decreased breath sounds, wheezing, rhonchi or rales.  Chest:     Chest wall: Tenderness present.     Comments: + left chest wall TTP Abdominal:     Palpations: Abdomen is soft.     Tenderness: There is no abdominal tenderness. There is no guarding or rebound.  Musculoskeletal:     Cervical back: Neck supple.     Right lower leg: No edema.     Left lower leg: No edema.  Skin:    General: Skin is warm and dry.  Neurological:     Mental Status: She is alert.    ED Results / Procedures / Treatments   Labs (all labs ordered are listed, but only abnormal results are displayed) Labs Reviewed  CBC - Abnormal; Notable for the following components:      Result Value   Hemoglobin 11.7 (*)    All other components within normal limits  BASIC METABOLIC PANEL  TROPONIN I (HIGH SENSITIVITY)    EKG EKG Interpretation  Date/Time:  Saturday September 07 2020 11:41:04 EDT Ventricular Rate:  88 PR Interval:  150 QRS Duration: 75 QT Interval:  371 QTC Calculation: 449 R Axis:   -18 Text Interpretation: Sinus rhythm Left ventricular hypertrophy Borderline T abnormalities, lateral leads 12 Lead; Mason-Likar normal axis No acute abnormalities Confirmed by  Lorre Munroe ([id number]) on 09/07/2020 1:20:42 PM  Radiology DG Chest 2 View  Result Date: 09/07/2020 CLINICAL DATA:  Chest pain EXAM: CHEST - 2 VIEW COMPARISON:  Chest x-ray dated 05/13/2020 FINDINGS: Heart size and mediastinal contours are within normal limits. Lungs are clear. No pleural effusion or pneumothorax is seen. No acute-appearing osseous abnormality. Chronic vertebral body wedging within the upper lumbar spine. IMPRESSION: No active cardiopulmonary disease. No evidence of pneumonia or pulmonary edema. Electronically Signed   By: Franki Cabot M.D.   On: 09/07/2020 12:55    Procedures Procedures   Medications Ordered in ED Medications  aspirin chewable tablet 324 mg (324 mg Oral Given 09/07/20 1206)    ED Course  I have reviewed the triage vital signs and the nursing notes.  Pertinent labs & imaging results that were available during my care of the patient were reviewed by me and considered in my medical decision making (see chart for details).  Clinical Course as of 09/07/20 1327  Sat Sep 07, 2020  1236 Troponin I (High Sensitivity): <2 [MV]    Clinical Course User Index [MV] Eustaquio Maize, PA-C   MDM Rules/Calculators/A&P HEAR Score: 90                         50 year old female who presents to the ED today with left-sided chest pain that began around 8:15 AM this morning.  History of precordial chest pain in the past, follows with cardiology however has infrequent visits due to lack of insurance.  On arrival to the ED today  patient is afebrile, nontachycardic and nontachypneic.  Blood pressure 149/92.  She is pressing on her left chest that she states holding pressure somewhat alleviates the pain.  On my exam she has equal pulses bilaterally.  She is noted to have left-sided chest wall tenderness palpation.  Heart and lung sounds clear.  We will plan for cardiac work-up at this time and provide aspirin for pain relief.  If negative we will plan to follow-up with cardiology as  this is likely patient's typical precordial pain.  She denies any risk factors for DVT/PE today.  She is nontachycardic, nonhypoxic.  Denies any shortness of breath.  Low suspicion for PE at this time.   EKG without acute ischemic changes CXR clear CBC without leukocytosis. Hgb 11.7 which appears to be consistent with baseline BMP without electrolyte abnormalities Troponin < 2  Heart score of 3 with recommendations for early discharge prior to repeat troponin. Given no EKG abnormalities and atypical sounding story will plan to discharge with cardiology follow up. Pt in agreement with plan and stable for discharge home.   This note was prepared using Dragon voice recognition software and may include unintentional dictation errors due to the inherent limitations of voice recognition software.   Final Clinical Impression(s) / ED Diagnoses Final diagnoses:  Nonspecific chest pain    Rx / DC Orders ED Discharge Orders     None        Discharge Instructions      Your workup was overall reassuring in the ED today. Please follow up with your cardiologist for further evaluation of your pain.   Please also follow up with your PCP. If you do not have one you can follow up with [facility] for primary care needs.   Return to the ED IMMEDIATELY for any new/worsening symptoms       Eustaquio Maize, Hershal Coria 09/07/20 1328    Arnaldo Natal, MD 09/07/20 [phone]

## 2020-09-07 NOTE — ED Triage Notes (Signed)
Pt states she started having pain in her arm and chest this morning. Denies N/V, or SOB. Alert and oriented.

## 2020-09-07 NOTE — Discharge Instructions (Addendum)
Your workup was overall reassuring in the ED today. Please follow up with your cardiologist for further evaluation of your pain.   Please also follow up with your PCP. If you do not have one you can follow up with [facility] for primary care needs.   Return to the ED IMMEDIATELY for any new/worsening symptoms

## 2020-09-12 DIAGNOSIS — K219 Gastro-esophageal reflux disease without esophagitis: Secondary | ICD-10-CM | POA: Insufficient documentation

## 2020-09-12 DIAGNOSIS — D259 Leiomyoma of uterus, unspecified: Secondary | ICD-10-CM | POA: Insufficient documentation

## 2020-09-12 DIAGNOSIS — J45909 Unspecified asthma, uncomplicated: Secondary | ICD-10-CM | POA: Insufficient documentation

## 2020-09-12 DIAGNOSIS — K449 Diaphragmatic hernia without obstruction or gangrene: Secondary | ICD-10-CM | POA: Insufficient documentation

## 2020-09-12 DIAGNOSIS — R200 Anesthesia of skin: Secondary | ICD-10-CM | POA: Insufficient documentation

## 2020-09-12 DIAGNOSIS — I10 Essential (primary) hypertension: Secondary | ICD-10-CM | POA: Insufficient documentation

## 2020-09-12 DIAGNOSIS — N9489 Other specified conditions associated with female genital organs and menstrual cycle: Secondary | ICD-10-CM | POA: Insufficient documentation

## 2020-09-12 DIAGNOSIS — Z79899 Other long term (current) drug therapy: Secondary | ICD-10-CM | POA: Insufficient documentation

## 2020-09-12 DIAGNOSIS — R072 Precordial pain: Secondary | ICD-10-CM | POA: Insufficient documentation

## 2020-09-13 ENCOUNTER — Other Ambulatory Visit: Payer: Self-pay

## 2020-09-13 ENCOUNTER — Emergency Department (HOSPITAL_COMMUNITY): Payer: Self-pay

## 2020-09-13 ENCOUNTER — Emergency Department (HOSPITAL_COMMUNITY)
Admission: EM | Admit: 2020-09-13 | Discharge: 2020-09-13 | Disposition: A | Discharge status: Home / Self Care | Payer: Self-pay | Attending: Emergency Medicine | Admitting: Emergency Medicine

## 2020-09-13 ENCOUNTER — Encounter (HOSPITAL_COMMUNITY): Payer: Self-pay | Admitting: Emergency Medicine

## 2020-09-13 DIAGNOSIS — K449 Diaphragmatic hernia without obstruction or gangrene: Secondary | ICD-10-CM

## 2020-09-13 DIAGNOSIS — D259 Leiomyoma of uterus, unspecified: Secondary | ICD-10-CM

## 2020-09-13 DIAGNOSIS — R072 Precordial pain: Secondary | ICD-10-CM

## 2020-09-13 LAB — I-STAT BETA HCG BLOOD, ED (MC, WL, AP ONLY): I-stat hCG, quantitative: <5 m[IU]/mL (ref <5)

## 2020-09-13 LAB — CBC
HCT: 35.3 % — ABNORMAL LOW (ref 36.0–46.0)
Hemoglobin: 11.2 g/dL — ABNORMAL LOW (ref 12.0–15.0)
MCH: 27.1 pg (ref 26.0–34.0)
MCHC: 31.7 g/dL (ref 30.0–36.0)
MCV: 85.3 fL (ref 80.0–100.0)
Platelets: 258 10*3/uL (ref 150–400)
RBC: 4.14 MIL/uL (ref 3.87–5.11)
RDW: 14.2 % (ref 11.5–15.5)
WBC: 6.5 10*3/uL (ref 4.0–10.5)
nRBC: 0 % (ref 0.0–0.2)

## 2020-09-13 LAB — BASIC METABOLIC PANEL
Anion gap: 8 (ref 5–15)
BUN: 12 mg/dL (ref 6–20)
CO2: 28 mmol/L (ref 22–32)
Calcium: 9.1 mg/dL (ref 8.9–10.3)
Chloride: 100 mmol/L (ref 98–111)
Creatinine, Ser: 0.75 mg/dL (ref 0.44–1.00)
GFR, Estimated: >60 mL/min (ref >60)
Glucose, Bld: 110 mg/dL — ABNORMAL HIGH (ref 70–99)
Potassium: 3.8 mmol/L (ref 3.5–5.1)
Sodium: 136 mmol/L (ref 135–145)

## 2020-09-13 LAB — TROPONIN I (HIGH SENSITIVITY)
Troponin I (High Sensitivity): <2 ng/L (ref <18)
Troponin I (High Sensitivity): <2 ng/L (ref <18)

## 2020-09-13 MED ORDER — KETOROLAC TROMETHAMINE 30 MG/ML IJ SOLN
30.0000 mg | Freq: Once | INTRAMUSCULAR | Status: AC
  Administered 2020-09-13: 30 mg via INTRAVENOUS
  Filled 2020-09-13: qty 1

## 2020-09-13 MED ORDER — IOHEXOL 350 MG/ML SOLN
100.0000 mL | Freq: Once | INTRAVENOUS | Status: AC | PRN
  Administered 2020-09-13: 100 mL via INTRAVENOUS

## 2020-09-13 NOTE — Discharge Instructions (Addendum)
No clear cause of your symptoms was found tonight.  Please follow-up with your regular doctor.  The CT scan did show incidental findings of a moderate hiatal hernia, you should discuss this with your doctor, and may need to see a gastroenterologist.  Your CT scan also showed some uterine fibroids, please discuss this with your doctor and/or your OB/GYN.

## 2020-09-13 NOTE — ED Triage Notes (Signed)
Patient arrives complaining of continued chest pain and arm numbness after being seen on 8/13. Patient states they did not find anything wrong, but now she is having right jaw numbness.

## 2020-09-13 NOTE — ED Provider Notes (Signed)
[facility] DEPT Provider Note   CSN: [id number] Arrival date & time: 09/12/20  2354     History Chief Complaint  Patient presents with   Chest Pain    Briana Wade is a 50 y.o. female.  Patient presents to the emergency department with a chief complaint of chest pain.  She states that she has been having pain for approximately 1 week.  She was seen on 8/13 for the same, but had negative work-up.  She states that she continues to have constant left-sided chest pain.  She also complains of some numbness in her arm and jaw.  The history is provided by the patient. No language interpreter was used.      Past Medical History:  Diagnosis Date   Abdominal pain, unspecified site    Allergy    Anemia    Asthma    Chlamydia    Diverticulitis    Esophageal reflux    Hiatal hernia    Hypertension    IBS (irritable bowel syndrome)    Insomnia    Lactose intolerance    Migraine equivalent 08/2014   minor right arm weakness in [facility]. neg CVA on imaging.    Other constipation    Personal history of colonic polyps 09/12/2010   tubular adenoma   Trichimoniasis    Tubular adenoma of colon    Umbilical hernia    Uterine fibroid    Uterine leiomyoma     Patient Active Problem List   Diagnosis Date Noted   Diverticulosis of sigmoid colon 04/27/2016   Acute diverticulitis of intestine 04/27/2016   Diverticula of colon 05/21/2015   Asthma 05/21/2015   Acute diverticulitis 05/21/2015   Diverticulitis of sigmoid colon 06/02/2011   Nausea and vomiting 06/02/2011   Otitis 06/02/2011   HTN (hypertension) 06/02/2011   GERD (gastroesophageal reflux disease) 10/21/2010   Gluten intolerance 10/21/2010   Personal history of colonic polyps 10/21/2010   Flatulence, eructation, and gas pain 09/12/2010   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   Irritable bowel syndrome 09/12/2010   Abdominal pain 08/26/2010    Bloating 08/26/2010   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   IBS (irritable bowel syndrome) 08/26/2010   CONSTIPATION, CHRONIC 05/11/2007   ABDOMINAL PAIN, CHRONIC 05/11/2007    Past Surgical History:  Procedure Laterality Date   APPENDECTOMY     COLONOSCOPY     DILATION AND CURETTAGE OF UTERUS     x2 for EAB   HYSTEROSCOPY WITH NOVASURE N/A 12/17/2015   Procedure: HYSTEROSCOPY WITH NOVASURE;  Surgeon: Aletha Halim, MD;  Location: [facility];  Service: Gynecology;  Laterality: N/A;   TUBAL LIGATION       OB History     Gravida  7   Para  3   Term  3   Preterm      AB  4   Living  3      SAB  1   IAB  3   Ectopic      Multiple      Live Births           Obstetric Comments  svd x 3         Family History  Problem Relation Age of Onset   Hypertension Mother    Cancer Mother        not sure of type   Asthma Cousin    Asthma Son    Colon cancer Neg Hx    Esophageal cancer Neg  Hx    Rectal cancer Neg Hx    Stomach cancer Neg Hx     Social History   Tobacco Use   Smoking status: Never   Smokeless tobacco: Never  Vaping Use   Vaping Use: Never used  Substance Use Topics   Alcohol use: No   Drug use: No    Home Medications Prior to Admission medications   Medication Sig Start Date End Date Taking? Authorizing Provider  amLODipine (NORVASC) 5 MG tablet Take 1 tablet (5 mg total) by mouth daily. 08/05/20 12/03/20  Cantwell, Celeste C, PA-C  diphenhydrAMINE (BENADRYL) 25 MG tablet Take 1 tablet (25 mg total) by mouth every 6 (six) hours as needed. 08/08/20   Charlesetta Shanks, MD  ibuprofen (ADVIL) 600 MG tablet Take 1 tablet (600 mg total) by mouth every 6 (six) hours as needed. 08/08/20   Charlesetta Shanks, MD  metoCLOPramide (REGLAN) 10 MG tablet Take 1 tablet (10 mg total) by mouth every 6 (six) hours. Patient not taking: Reported on 09/07/2020 08/08/20   Charlesetta Shanks, MD  metoprolol tartrate (LOPRESSOR) 25 MG tablet Take 25 mg by mouth 2  (two) times daily. 04/01/20   [provider]    Allergies    Lactose intolerance (gi)  Review of Systems   Review of Systems  All other systems reviewed and are negative.  Physical Exam Updated Vital Signs BP (!) 154/105 (BP Location: Left Arm) Comment: Multiple attempts  Pulse 90   Temp 98.2 F (36.8 C) (Oral)   Resp (!) 8   SpO2 97%   Physical Exam Vitals and nursing note reviewed.  Constitutional:      General: She is not in acute distress.    Appearance: She is well-developed.  HENT:     Head: Normocephalic and atraumatic.  Eyes:     Conjunctiva/sclera: Conjunctivae normal.  Cardiovascular:     Rate and Rhythm: Normal rate and regular rhythm.     Pulses: Normal pulses.     Heart sounds: No murmur heard.    Comments: Left chest wall tenderness Normal distal pulses Pulmonary:     Effort: Pulmonary effort is normal. No respiratory distress.     Breath sounds: Normal breath sounds.  Abdominal:     Palpations: Abdomen is soft.     Tenderness: There is no abdominal tenderness.  Musculoskeletal:        General: Normal range of motion.     Cervical back: Neck supple.  Skin:    General: Skin is warm and dry.  Neurological:     Mental Status: She is alert and oriented to person, place, and time.  Psychiatric:        Mood and Affect: Mood normal.        Behavior: Behavior normal.    ED Results / Procedures / Treatments   Labs (all labs ordered are listed, but only abnormal results are displayed) Labs Reviewed  BASIC METABOLIC PANEL  CBC  D-DIMER, QUANTITATIVE  I-STAT BETA HCG BLOOD, ED ([facility], [facility], [facility] ONLY)  TROPONIN I (HIGH SENSITIVITY)    EKG EKG Interpretation  Date/Time:  Friday September 13 2020 00:09:22 EDT Ventricular Rate:  90 PR Interval:  166 QRS Duration: 74 QT Interval:  373 QTC Calculation: 457 R Axis:   -26 Text Interpretation: Sinus rhythm Abnormal R-wave progression, early transition Left ventricular hypertrophy 12 Lead; Mason-Likar  No significant change was found Confirmed by Ezequiel Essex [id number]) on 09/13/2020 12:11:56 AM  Radiology DG Chest 2 View  Result  Date: 09/13/2020 CLINICAL DATA:  Increasing chest pain EXAM: CHEST - 2 VIEW COMPARISON:  09/07/2020 FINDINGS: The heart size and mediastinal contours are within normal limits. Both lungs are clear. The visualized skeletal structures are unremarkable. IMPRESSION: No active cardiopulmonary disease. Electronically Signed   By: Inez Catalina M.D.   On: 09/13/2020 01:06   CT Angio Chest/Abd/Pel for Dissection W and/or Wo Contrast  Result Date: 09/13/2020 CLINICAL DATA:  Chest and back pain.  Concern for aortic dissection. EXAM: CT ANGIOGRAPHY CHEST, ABDOMEN AND PELVIS TECHNIQUE: Non-contrast CT of the chest was initially obtained. Multidetector CT imaging through the chest, abdomen and pelvis was performed using the standard protocol during bolus administration of intravenous contrast. Multiplanar reconstructed images and MIPs were obtained and reviewed to evaluate the vascular anatomy. CONTRAST:  18m OMNIPAQUE IOHEXOL 350 MG/ML SOLN COMPARISON:  CT of the abdomen pelvis dated 02/07/2018. FINDINGS: CTA CHEST FINDINGS Cardiovascular: No cardiomegaly or pericardial effusion. The thoracic aorta is unremarkable. The origins of the great vessels of the aortic arch appear patent. No central pulmonary artery embolus identified. Mediastinum/Nodes: No hilar or mediastinal adenopathy. There is a moderate size hiatal hernia. The esophagus and thyroid gland are grossly unremarkable. No mediastinal fluid collection. Lungs/Pleura: Biapical linear atelectasis/scarring. No focal consolidation, pleural effusion, or pneumothorax. The central airways are patent. Musculoskeletal: No acute osseous pathology. Review of the MIP images confirms the above findings. CTA ABDOMEN AND PELVIS FINDINGS VASCULAR Aorta: Normal caliber aorta without aneurysm, dissection, vasculitis or significant stenosis. Celiac:  Patent without evidence of aneurysm, dissection, vasculitis or significant stenosis. SMA: Patent without evidence of aneurysm, dissection, vasculitis or significant stenosis. Renals: Both renal arteries are patent without evidence of aneurysm, dissection, vasculitis, fibromuscular dysplasia or significant stenosis. IMA: Patent without evidence of aneurysm, dissection, vasculitis or significant stenosis. Inflow: Patent without evidence of aneurysm, dissection, vasculitis or significant stenosis. Veins: No obvious venous abnormality within the limitations of this arterial phase study. Review of the MIP images confirms the above findings. NON-VASCULAR No intra-abdominal free air or free fluid. Hepatobiliary: No focal liver abnormality is seen. No gallstones, gallbladder wall thickening, or biliary dilatation. Pancreas: Unremarkable. No pancreatic ductal dilatation or surrounding inflammatory changes. Spleen: Normal in size without focal abnormality. Adrenals/Urinary Tract: Adrenal glands are unremarkable. Kidneys are normal, without renal calculi, focal lesion, or hydronephrosis. Bladder is unremarkable. Stomach/Bowel: There is sigmoid diverticulosis without active inflammatory changes. Moderate stool throughout the colon. No bowel obstruction or active inflammation. Appendectomy. Lymphatic: No adenopathy. Reproductive: The uterus is anteverted. There is thickened appearance of the posterior uterine body which may represent a fibroid. The endometrium is poorly visualized but appears somewhat thickened. Further evaluation with pelvic ultrasound on a nonemergent/outpatient basis recommended. No adnexal masses. Other: Small fat containing umbilical hernia. Musculoskeletal: Mild chronic appearing compression fracture superior endplate of L1 with mild anterior wedging. No acute osseous pathology. Review of the MIP images confirms the above findings. IMPRESSION: 1. No acute intrathoracic, abdominal, or pelvic pathology. No  aortic aneurysm or dissection. 2. Moderate size hiatal hernia. 3. Sigmoid diverticulosis. No bowel obstruction. 4. Probable posterior uterine fibroid and thickened appearance of the endometrium. Further evaluation with pelvic ultrasound on a nonemergent/outpatient basis recommended. Electronically Signed   By: AAnner CreteM.D.   On: 09/13/2020 02:09    Procedures Procedures   Medications Ordered in ED Medications  ketorolac (TORADOL) 30 MG/ML injection 30 mg (has no administration in time range)    ED Course  I have reviewed the triage vital signs and the nursing  notes.  Pertinent labs & imaging results that were available during my care of the patient were reviewed by me and considered in my medical decision making (see chart for details).    MDM Rules/Calculators/A&P                           Patient here with persistent chest pain.  She has had the symptoms for over a week.  She also reports some numbness sensation in her arms.  She has no weakness and has normal pulses.  She is noted to be somewhat hypertensive.  While I think that dissection is less likely, I do think that we need to rule this out given her persistent symptoms and numbness.  CT shows no evidence of dissection.  It is notable for a hiatal hernia and uterine fibroid.  I have discussed these results with the patient.  She will follow-up with her doctor.  Troponins are negative x2.  Lab work is reassuring.  Uncertain etiology of the patient's pain, however it is reproducible with palpation.  At this time, I do not feel that any further emergent work-up is indicated, and that patient can be safely discharged home with outpatient follow-up. Final Clinical Impression(s) / ED Diagnoses Final diagnoses:  Precordial chest pain  Uterine leiomyoma, unspecified location  Hiatal hernia    Rx / DC Orders ED Discharge Orders     None        Montine Circle, PA-C 09/13/20 0326    Ezequiel Essex, MD 09/13/20  [phone]

## 2020-10-22 ENCOUNTER — Inpatient Hospital Stay: Admission: RE | Admit: 2020-10-22 | Payer: Medicaid Other | Source: Ambulatory Visit

## 2020-10-28 ENCOUNTER — Encounter (HOSPITAL_COMMUNITY): Payer: Self-pay | Admitting: Radiology

## 2020-11-06 ENCOUNTER — Ambulatory Visit: Payer: Medicaid Other | Admitting: Student

## 2020-11-06 NOTE — Progress Notes (Deleted)
Primary Physician/Referring:  Trey Sailors, PA  Patient ID: Briana Wade, female    DOB: Jul 03, 1970, 50 y.o.   MRN: [id number]  No chief complaint on file.  HPI:    Briana Wade  is a 50 y.o. obese AA female with history of hypertension, asthma.  Denies family history of premature CAD, diabetes, hyperlipidemia, TIA/CVA, prior MI/DVT/PE.  Denies history of tobacco use.  She was referred to our office by PCP, Raelyn Number, PA-C, for evaluation of precordial pain.  Patient presents for 51-month follow-up of precordial pain, hypertension, and results of cardiac testing.  Notably since last visit patient was evaluated in the ED with on 8/13 and 8/19 for chest pain both work-ups for relatively unremarkable, CT did reveal a moderate sized hiatal hernia. ***  ***  Patient presents for 51 week follow up of precordial pain, hypertension, and cardiovascular risk management. At last visit recommended further cardiac testing including ischemic evaluation, however due to financial constraints and no health insurance, patient had opted to hold off on further testing despite the risks.  Patient unfortunately still does not have insurance coverage, however she does continue to have episodes of precordial pain.  Symptoms are unchanged since last visit.  Denies dyspnea, palpitations, syncope, near syncope, dizziness, leg swelling, orthopnea, PND.  Past Medical History:  Diagnosis Date   Abdominal pain, unspecified site    Allergy    Anemia    Asthma    Chlamydia    Diverticulitis    Esophageal reflux    Hiatal hernia    Hypertension    IBS (irritable bowel syndrome)    Insomnia    Lactose intolerance    Migraine equivalent 08/2014   minor right arm weakness in [facility]. neg CVA on imaging.    Other constipation    Personal history of colonic polyps 09/12/2010   tubular adenoma   Trichimoniasis    Tubular adenoma of colon    Umbilical hernia    Uterine fibroid    Uterine leiomyoma     Past Surgical History:  Procedure Laterality Date   APPENDECTOMY     COLONOSCOPY     DILATION AND CURETTAGE OF UTERUS     x2 for EAB   HYSTEROSCOPY WITH NOVASURE N/A 12/17/2015   Procedure: HYSTEROSCOPY WITH NOVASURE;  Surgeon: Aletha Halim, MD;  Location: [facility];  Service: Gynecology;  Laterality: N/A;   TUBAL LIGATION     Family History  Problem Relation Age of Onset   Hypertension Mother    Cancer Mother        not sure of type   Asthma Cousin    Asthma Son    Colon cancer Neg Hx    Esophageal cancer Neg Hx    Rectal cancer Neg Hx    Stomach cancer Neg Hx     Social History   Tobacco Use   Smoking status: Never   Smokeless tobacco: Never  Substance Use Topics   Alcohol use: No   Marital Status: Single   ROS  Review of Systems  Constitutional: Negative for malaise/fatigue and weight gain.  Cardiovascular:  Positive for chest pain (pressure). Negative for claudication, leg swelling, near-syncope, orthopnea, palpitations, paroxysmal nocturnal dyspnea and syncope.  Respiratory:  Negative for shortness of breath.   Hematologic/Lymphatic: Does not bruise/bleed easily.  Gastrointestinal:  Negative for melena.  Neurological:  Negative for dizziness and weakness.   Objective  There were no vitals taken for this visit.  Vitals with BMI 09/13/2020 09/13/2020  09/13/2020  Height - - -  Weight - - -  BMI - - -  Systolic 169 678 938  Diastolic 81 88 80  Pulse - 69 69      Physical Exam Vitals reviewed.  Constitutional:      Appearance: She is obese.  Neck:     Vascular: No carotid bruit.  Cardiovascular:     Rate and Rhythm: Normal rate and regular rhythm.     Pulses: Intact distal pulses.          Carotid pulses are 2+ on the right side and 2+ on the left side.      Radial pulses are 2+ on the right side and 2+ on the left side.       Popliteal pulses are 2+ on the right side and 2+ on the left side.       Dorsalis pedis pulses are 2+ on the right side and 2+  on the left side.       Posterior tibial pulses are 2+ on the right side and 2+ on the left side.     Heart sounds: S1 normal and S2 normal. No murmur heard.   No gallop.     Comments: Femoral pulse difficult to evaluate secondary to patient body habitus. Pulmonary:     Effort: Pulmonary effort is normal. No respiratory distress.     Breath sounds: No wheezing, rhonchi or rales.  Musculoskeletal:     Right lower leg: No edema.     Left lower leg: No edema.  Neurological:     Mental Status: She is alert.    Laboratory examination:   Recent Labs    08/08/20 2020 09/07/20 1156 09/13/20 0027  NA 139 139 136  K 4.0 4.7 3.8  CL 103 103 100  CO2 32 29 28  GLUCOSE 107* 85 110*  BUN 14 11 12   CREATININE 0.75 0.75 0.75  CALCIUM 9.0 9.5 9.1  GFRNONAA >60 >60 >60    CrCl cannot be calculated (Patient's most recent lab result is older than the maximum 21 days allowed.).  CMP Latest Ref Rng & Units 09/13/2020 09/07/2020 08/08/2020  Glucose 70 - 99 mg/dL 110(H) 85 107(H)  BUN 6 - 20 mg/dL 12 11 14   Creatinine 0.44 - 1.00 mg/dL 0.75 0.75 0.75  Sodium 135 - 145 mmol/L 136 139 139  Potassium 3.5 - 5.1 mmol/L 3.8 4.7 4.0  Chloride 98 - 111 mmol/L 100 103 103  CO2 22 - 32 mmol/L 28 29 32  Calcium 8.9 - 10.3 mg/dL 9.1 9.5 9.0  Total Protein 6.5 - 8.1 g/dL - - -  Total Bilirubin 0.3 - 1.2 mg/dL - - -  Alkaline Phos 38 - 126 U/L - - -  AST 15 - 41 U/L - - -  ALT 0 - 44 U/L - - -   CBC Latest Ref Rng & Units 09/13/2020 09/07/2020 08/08/2020  WBC 4.0 - 10.5 K/uL 6.5 5.8 8.0  Hemoglobin 12.0 - 15.0 g/dL 11.2(L) 11.7(L) 12.2  Hematocrit 36.0 - 46.0 % 35.3(L) 36.9 38.5  Platelets 150 - 400 K/uL 258 282 290    Lipid Panel No results for input(s): CHOL, TRIG, LDLCALC, VLDL, HDL, CHOLHDL, LDLDIRECT in the last 8760 hours.  HEMOGLOBIN A1C Lab Results  Component Value Date   HGBA1C 5.4 03/26/2020   MPG 108.28 03/26/2020   TSH No results for input(s): TSH in the last 8760 hours.  External  labs:  05/15/2020: Glucose 117, BUN 10, creatinine  0.95, GFR 70, sodium 139, potassium 3.4,  09/28/2018: Hemoglobin 12.6, hematocrit 37.0, MCV 84, platelet 2 A1c 5.2% Total cholesterol 183, triglycerides 107, HDL 62, LDL 102  Allergies   Allergies  Allergen Reactions   Lactose Intolerance (Gi)       Medications Prior to Visit:   Outpatient Medications Prior to Visit  Medication Sig Dispense Refill   amLODipine (NORVASC) 5 MG tablet Take 1 tablet (5 mg total) by mouth daily. 30 tablet 3   diphenhydrAMINE (BENADRYL) 25 MG tablet Take 1 tablet (25 mg total) by mouth every 6 (six) hours as needed. 30 tablet 0   ibuprofen (ADVIL) 600 MG tablet Take 1 tablet (600 mg total) by mouth every 6 (six) hours as needed. 30 tablet 0   metoCLOPramide (REGLAN) 10 MG tablet Take 1 tablet (10 mg total) by mouth every 6 (six) hours. (Patient not taking: Reported on 09/07/2020) 30 tablet 0   metoprolol tartrate (LOPRESSOR) 25 MG tablet Take 25 mg by mouth 2 (two) times daily.     No facility-administered medications prior to visit.     Final Medications at End of Visit    No outpatient medications have been marked as taking for the 11/06/20 encounter (Appointment) with Rayetta Pigg, Xzander Gilham C, PA-C.   Radiology:   No results found.  Cardiac Studies:   LE DVT study 03/05/2020:  RIGHT:  - No evidence of common femoral vein obstruction.  LEFT:  - There is no evidence of deep vein thrombosis in the lower extremity.  - No cystic structure found in the popliteal fossa.  EKG:   06/03/2020: Sinus rhythm at a rate of 84 bpm.  Left axis, left anterior fascicular block. LVH. Poor R wave progression, cannot exclude anteroseptal infarct old.  Nonspecific T wave abnormality.  No evidence of underlying ischemia or injury pattern.  05/13/2020: Sinus rhythm at a rate of 62 bpm.  Normal axis.  LVH. Poor R wave progression, cannot exclude anteroseptal infarct old.  Nonspecific T wave abnormality.  Assessment   No  diagnosis found.    There are no discontinued medications.   No orders of the defined types were placed in this encounter.   Recommendations:   Briana Wade is a 50 y.o. obese AA female with history of hypertension, asthma.  Denies family history of premature CAD, diabetes, hyperlipidemia, TIA/CVA, prior MI/DVT/PE.  Denies history of tobacco use.  She was referred to our office by PCP, Raelyn Number, PA-C, for evaluation of precordial pain.  Patient presents for 51 week follow up of precordial pain, hypertension, and cardiovascular risk management. At last visit recommended further cardiac testing including ischemic evaluation, however due to financial constraints and no health insurance, patient had opted to hold off on further testing despite the risks.  Given patient's ongoing symptoms she is now willing to proceed with coronary calcium score and echocardiogram.  She will work with our office as well as working on [employer] production planner to schedule echocardiogram, particularly given EKG concerning for LVH.  Patient's blood pressure is uncontrolled in the office today, will therefore add amlodipine 5 mg once daily both for blood pressure management as well as antianginal benefit.  Follow-up in 3 months, sooner if needed, for precordial pain, hypertension, and results of cardiac testing.   Alethia Berthold, PA-C 11/06/2020, 8:34 AM Office: [phone]

## 2020-11-25 ENCOUNTER — Telehealth: Payer: Self-pay | Admitting: Obstetrics and Gynecology

## 2020-11-25 NOTE — Telephone Encounter (Signed)
Called patient to get her scheduled with a appointment with Dr.Pickens

## 2020-11-28 ENCOUNTER — Ambulatory Visit (INDEPENDENT_AMBULATORY_CARE_PROVIDER_SITE_OTHER): Payer: Self-pay | Admitting: Obstetrics and Gynecology

## 2020-11-28 ENCOUNTER — Other Ambulatory Visit: Payer: Self-pay

## 2020-11-28 ENCOUNTER — Other Ambulatory Visit (HOSPITAL_COMMUNITY)
Admission: RE | Admit: 2020-11-28 | Discharge: 2020-11-28 | Disposition: A | Discharge status: Home / Self Care | Payer: Medicaid Other | Source: Ambulatory Visit | Attending: Obstetrics and Gynecology | Admitting: Obstetrics and Gynecology

## 2020-11-28 VITALS — BP 147/90 | HR 73 | Ht 61.0 in | Wt 215.8 lb

## 2020-11-28 DIAGNOSIS — Z139 Encounter for screening, unspecified: Secondary | ICD-10-CM | POA: Diagnosis present

## 2020-11-28 DIAGNOSIS — Z5941 Food insecurity: Secondary | ICD-10-CM

## 2020-11-28 DIAGNOSIS — Z124 Encounter for screening for malignant neoplasm of cervix: Secondary | ICD-10-CM

## 2020-11-28 DIAGNOSIS — Z1231 Encounter for screening mammogram for malignant neoplasm of breast: Secondary | ICD-10-CM

## 2020-11-29 ENCOUNTER — Emergency Department (HOSPITAL_COMMUNITY): Payer: Medicaid Other

## 2020-11-29 ENCOUNTER — Emergency Department (HOSPITAL_COMMUNITY)
Admission: EM | Admit: 2020-11-29 | Discharge: 2020-11-29 | Disposition: A | Discharge status: Home / Self Care | Payer: Medicaid Other | Attending: Emergency Medicine | Admitting: Emergency Medicine

## 2020-11-29 ENCOUNTER — Other Ambulatory Visit: Payer: Self-pay

## 2020-11-29 ENCOUNTER — Encounter (HOSPITAL_COMMUNITY): Payer: Self-pay | Admitting: Emergency Medicine

## 2020-11-29 DIAGNOSIS — I10 Essential (primary) hypertension: Secondary | ICD-10-CM | POA: Insufficient documentation

## 2020-11-29 DIAGNOSIS — R0789 Other chest pain: Secondary | ICD-10-CM

## 2020-11-29 DIAGNOSIS — Z79899 Other long term (current) drug therapy: Secondary | ICD-10-CM | POA: Insufficient documentation

## 2020-11-29 DIAGNOSIS — R202 Paresthesia of skin: Secondary | ICD-10-CM | POA: Insufficient documentation

## 2020-11-29 DIAGNOSIS — J45909 Unspecified asthma, uncomplicated: Secondary | ICD-10-CM | POA: Insufficient documentation

## 2020-11-29 DIAGNOSIS — R072 Precordial pain: Secondary | ICD-10-CM | POA: Insufficient documentation

## 2020-11-29 LAB — BASIC METABOLIC PANEL
Anion gap: 6 (ref 5–15)
BUN: 13 mg/dL (ref 6–20)
CO2: 28 mmol/L (ref 22–32)
Calcium: 9.1 mg/dL (ref 8.9–10.3)
Chloride: 100 mmol/L (ref 98–111)
Creatinine, Ser: 0.73 mg/dL (ref 0.44–1.00)
GFR, Estimated: >60 mL/min (ref >60)
Glucose, Bld: 98 mg/dL (ref 70–99)
Potassium: 3.7 mmol/L (ref 3.5–5.1)
Sodium: 134 mmol/L — ABNORMAL LOW (ref 135–145)

## 2020-11-29 LAB — CBC
HCT: 37.9 % (ref 36.0–46.0)
Hemoglobin: 12 g/dL (ref 12.0–15.0)
MCH: 26.7 pg (ref 26.0–34.0)
MCHC: 31.7 g/dL (ref 30.0–36.0)
MCV: 84.4 fL (ref 80.0–100.0)
Platelets: 315 10*3/uL (ref 150–400)
RBC: 4.49 MIL/uL (ref 3.87–5.11)
RDW: 14.5 % (ref 11.5–15.5)
WBC: 6.7 10*3/uL (ref 4.0–10.5)
nRBC: 0 % (ref 0.0–0.2)

## 2020-11-29 LAB — TROPONIN I (HIGH SENSITIVITY): Troponin I (High Sensitivity): 3 ng/L (ref <18)

## 2020-11-29 MED ORDER — HYDROCODONE-ACETAMINOPHEN 5-325 MG PO TABS
1.0000 | ORAL_TABLET | Freq: Once | ORAL | Status: DC
Start: 2020-11-29 — End: 2020-11-29

## 2020-11-29 MED ORDER — ALUM & MAG HYDROXIDE-SIMETH 200-200-20 MG/5ML PO SUSP
30.0000 mL | Freq: Once | ORAL | Status: AC
  Administered 2020-11-29: 30 mL via ORAL
  Filled 2020-11-29: qty 30

## 2020-11-29 MED ORDER — LIDOCAINE VISCOUS HCL 2 % MT SOLN
15.0000 mL | Freq: Once | OROMUCOSAL | Status: AC
  Administered 2020-11-29: 15 mL via ORAL
  Filled 2020-11-29: qty 15

## 2020-11-29 NOTE — ED Provider Notes (Signed)
[place] DEPT Provider Note   CSN: [id number] Arrival date & time: 11/29/20  1653     History Chief Complaint  Patient presents with   Numbness   Chest Pain    Briana Wade is a 50 y.o. female.  HPI  Patient presents with chest pain.  Started yesterday, has been constant.  It is primarily substernal, does not radiate elsewhere.  Feels like burning, has not taken any medicine for relief.  Worse when she moves.  No associated shortness of breath, nausea, vomiting.  Patient has a history of high blood pressure high cholesterol, she takes her medicine every once in a while and she feels like it will . does not like taking a lot of pills.  She also endorses numbness and tingling bilaterally in her hands.  The started about 5- 5 days ago, its been constant.  It is not painful, denies any unilateral symptoms.  No headache, no weakness.  No risk factors for DVT-not on oral birth control, no recent surgery or travel.  No history of CAD.  She has had a history of precordial chest pain in the past and has been seen in the ED for it twice in August.  She was seen by the cardiologist in July 2022 with a reassuring echocardiogram.  Past Medical History:  Diagnosis Date   Abdominal pain, unspecified site    Allergy    Anemia    Asthma    Chlamydia    Diverticulitis    Esophageal reflux    Hiatal hernia    Hypertension    IBS (irritable bowel syndrome)    Insomnia    Lactose intolerance    Migraine equivalent 08/2014   minor right arm weakness in [facility]. neg CVA on imaging.    Other constipation    Personal history of colonic polyps 09/12/2010   tubular adenoma   Trichimoniasis    Tubular adenoma of colon    Umbilical hernia    Uterine fibroid    Uterine leiomyoma     Patient Active Problem List   Diagnosis Date Noted   Diverticulosis of sigmoid colon 04/27/2016   Acute diverticulitis of intestine 04/27/2016   Diverticula of colon 05/21/2015    Asthma 05/21/2015   Acute diverticulitis 05/21/2015   Diverticulitis of sigmoid colon 06/02/2011   Nausea and vomiting 06/02/2011   Otitis 06/02/2011   HTN (hypertension) 06/02/2011   GERD (gastroesophageal reflux disease) 10/21/2010   Gluten intolerance 10/21/2010   Personal history of colonic polyps 10/21/2010   Flatulence, eructation, and gas pain 09/12/2010   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   Irritable bowel syndrome 09/12/2010   Abdominal pain 08/26/2010   Bloating 08/26/2010   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   IBS (irritable bowel syndrome) 08/26/2010   CONSTIPATION, CHRONIC 05/11/2007   ABDOMINAL PAIN, CHRONIC 05/11/2007    Past Surgical History:  Procedure Laterality Date   APPENDECTOMY     COLONOSCOPY     DILATION AND CURETTAGE OF UTERUS     x2 for EAB   HYSTEROSCOPY WITH NOVASURE N/A 12/17/2015   Procedure: HYSTEROSCOPY WITH NOVASURE;  Surgeon: Aletha Halim, MD;  Location: [facility];  Service: Gynecology;  Laterality: N/A;   TUBAL LIGATION       OB History     Gravida  7   Para  3   Term  3   Preterm      AB  4   Living  3  SAB  1   IAB  3   Ectopic      Multiple      Live Births           Obstetric Comments  svd x 3         Family History  Problem Relation Age of Onset   Hypertension Mother    Cancer Mother        not sure of type   Asthma Cousin    Asthma Son    Colon cancer Neg Hx    Esophageal cancer Neg Hx    Rectal cancer Neg Hx    Stomach cancer Neg Hx     Social History   Tobacco Use   Smoking status: Never   Smokeless tobacco: Never  Vaping Use   Vaping Use: Never used  Substance Use Topics   Alcohol use: No   Drug use: No    Home Medications Prior to Admission medications   Medication Sig Start Date End Date Taking? Authorizing Provider  amLODipine (NORVASC) 5 MG tablet Take 1 tablet (5 mg total) by mouth daily. 08/05/20 12/03/20  Cantwell,  Celeste C, PA-C  diphenhydrAMINE (BENADRYL) 25 MG tablet Take 1 tablet (25 mg total) by mouth every 6 (six) hours as needed. 08/08/20   Charlesetta Shanks, MD  ibuprofen (ADVIL) 600 MG tablet Take 1 tablet (600 mg total) by mouth every 6 (six) hours as needed. 08/08/20   Charlesetta Shanks, MD  metoCLOPramide (REGLAN) 10 MG tablet Take 1 tablet (10 mg total) by mouth every 6 (six) hours. Patient not taking: Reported on 09/07/2020 08/08/20   Charlesetta Shanks, MD  metoprolol tartrate (LOPRESSOR) 25 MG tablet Take 25 mg by mouth 2 (two) times daily. Patient not taking: Reported on 11/28/2020 04/01/20   [provider]    Allergies    Lactose intolerance (gi)  Review of Systems   Review of Systems  Constitutional:  Negative for chills and fever.  HENT:  Negative for ear pain and sore throat.   Eyes:  Negative for pain and visual disturbance.  Respiratory:  Negative for cough and shortness of breath.   Cardiovascular:  Positive for chest pain. Negative for palpitations.  Gastrointestinal:  Negative for abdominal pain, nausea and vomiting.  Genitourinary:  Negative for dysuria and hematuria.  Musculoskeletal:  Negative for arthralgias.  Skin:  Negative for color change and rash.  Neurological:  Positive for numbness. Negative for dizziness, seizures, syncope and weakness.  All other systems reviewed and are negative.  Physical Exam Updated Vital Signs BP (!) 154/104   Pulse 88   Temp 98.3 F (36.8 C)   Resp 18   SpO2 99%   Physical Exam Vitals and nursing note reviewed. Exam conducted with a chaperone present.  Constitutional:      Appearance: Normal appearance.  HENT:     Head: Normocephalic and atraumatic.  Eyes:     General: No scleral icterus.       Right eye: No discharge.        Left eye: No discharge.     Extraocular Movements: Extraocular movements intact.     Pupils: Pupils are equal, round, and reactive to light.  Cardiovascular:     Rate and Rhythm: Normal rate and  regular rhythm.     Pulses: Normal pulses.     Heart sounds: Normal heart sounds. No murmur heard.   No friction rub. No gallop.     Comments: S1-S2, radial pulse 2+ bilaterally.  Chest  pain is reproducible with palpation of sternum Pulmonary:     Effort: Pulmonary effort is normal. No respiratory distress.     Breath sounds: Normal breath sounds.  Abdominal:     General: Abdomen is flat. Bowel sounds are normal. There is no distension.     Palpations: Abdomen is soft.     Tenderness: There is no abdominal tenderness.  Musculoskeletal:        General: No swelling, tenderness or signs of injury.  Skin:    General: Skin is warm and dry.     Capillary Refill: Capillary refill takes less than 2 seconds.     Coloration: Skin is not jaundiced.  Neurological:     Mental Status: She is alert. Mental status is at baseline.     Coordination: Coordination normal.     Comments: Cranial nerves III through XII are grossly intact.  Grip strength and sensation to light touch is grossly intact.   ED Results / Procedures / Treatments   Labs (all labs ordered are listed, but only abnormal results are displayed) Labs Reviewed  BASIC METABOLIC PANEL  CBC  TROPONIN I (HIGH SENSITIVITY)    EKG None  Radiology No results found.  Procedures Procedures   Medications Ordered in ED Medications - No data to display  ED Course  I have reviewed the triage vital signs and the nursing notes.  Pertinent labs & imaging results that were available during my care of the patient were reviewed by me and considered in my medical decision making (see chart for details).    MDM Rules/Calculators/A&P                           Patient is mildly hypertensive, otherwise vitals are stable.  They are nontoxic-appearing, do not appear to be any acute distress.  No hypoxia or tachycardia, EKG shows no changes from prior.  Chest pain is reproducible on exam, otherwise physical exam is unrevealing.  Heart score is  2.    Neuro exam is normal, plus the bilateral tingling would be very atypical for neurologic presentation.  She does report it is constant without any unilateral weakness.  Differential for tingling numbness in the hands bilaterally is broad, low suspicion for emergent pathology at this time.  We will check basic labs for electrolyte deficiency or possible macrocytic anemia concerning for any B12 deficiency.  However, I suspect this is within the patient will need to follow-up with her PCP about.  Chart review: Her last echo was done in July 2022 and was reassuring without any EF abnormalities.   Last CTA was in August 2022 did not show any acute cardiac pathology.  Labs/IMG CBC without any signs of anemia, no leukocytosis concerning for infectious process.  No macrocytic anemia that would be supportive of B12 deficiency. BMP without findings of electrolyte derangement. Troponin negative.  Patient pain started yesterday and has been primarily constant, 7 troponin not needed. Radiograph negative for signs of heart failure.  No signs of pneumonia or acute infectious process. EKG is without any ST elevations or depressions.  No tachycardia or arrhythmia noted.  Although I do not have an explanation for her symptoms, based on this work-up and her history I do not have high suspicion for emergent pathology.  Patient pain is a 2 out of 10, her vitals are stable throughout the visit.  She does not have great outpatient follow-up and reports she cannot afford seeing her PCP or  specialist.  I did put in a consult call to transitional care team to possibly provide assistance with medication and PCP follow-up.  Given it is a Friday evening I suspect that we will happen until next week.    Discussed the differential with the patient as well as return precautions.  The numbness and tingling in her hands is bilateral without any neurovascular damage.  No overlying skin changes concerning for cellulitis.  Doubt  anything emergent at this time.  Patient discharged in stable position.  Final Clinical Impression(s) / ED Diagnoses Final diagnoses:  None    Rx / DC Orders ED Discharge Orders     None        Sherrill Raring, Hershal Coria 11/29/20 1855    Drenda Freeze, MD 11/29/20 [phone]

## 2020-11-29 NOTE — TOC Initial Note (Signed)
Transition of Care [facility]) - Initial/Assessment Note    Patient Details  Name: Briana Wade MRN: [id number] Date of Birth: 1970-04-28  [facility] CM/SW Contact:    Erenest Rasher, RN Phone Number: 11/29/2020, 8:41 PM  Clinical Narrative:                 HF TOC CM spoke to pt and states he has to pay out of pocket to see her PCP. She went to see PCP but could not afford copay. She had her blood pressure at home, but was not taking due to the cough. She was going to resume Amlodipine to help reduce blood pressure. Provided pt with contact number for [facility] to call Monday to schedule follow up appt. Explained she can use the [facility] pharmacy for discounted medications.   Expected Discharge Plan: Home/Self Care Barriers to Discharge: No Barriers Identified   Patient Goals and CMS Choice   Expected Discharge Plan and Services Expected Discharge Plan: Home/Self Care   Discharge Planning Services: CM Consult, Follow-up appt scheduled   Prior Living Arrangements/Services   Lives with:: Self   Do you feel safe going back to the place where you live?: Yes      Need for Family Participation in Patient Care: No (Comment) Care giver support system in place?: No (comment)   Criminal Activity/Legal Involvement Pertinent to Current Situation/Hospitalization: No - Comment as needed  Activities of Daily Living      Permission Sought/Granted                  Emotional Assessment       Orientation: : Oriented to Self, Oriented to Place, Oriented to  Time, Oriented to Situation   Psych Involvement: No (comment)  Admission diagnosis:  TIngling in Arms and Hands  Patient Active Problem List   Diagnosis Date Noted   Diverticulosis of sigmoid colon 04/27/2016   Acute diverticulitis of intestine 04/27/2016   Diverticula of colon 05/21/2015   Asthma 05/21/2015   Acute diverticulitis 05/21/2015   Diverticulitis of sigmoid colon 06/02/2011    Nausea and vomiting 06/02/2011   Otitis 06/02/2011   HTN (hypertension) 06/02/2011   GERD (gastroesophageal reflux disease) 10/21/2010   Gluten intolerance 10/21/2010   Personal history of colonic polyps 10/21/2010   Flatulence, eructation, and gas pain 09/12/2010   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

## 2020-11-29 NOTE — Discharge Instructions (Addendum)
As we discussed, we did not find anything emergent on your work-up today in the emergency department.  Your blood pressure has been elevated and I recommend taking your amlodipine again.  Please follow-up with a primary care doctor in the next week or 2 to talk about the numbness and tingling in her hands.  I did put in a consult for social work so he should get a call next week regarding medication assistance.  Please follow-up with neurology as needed for evaluation about the tingling in her hands if it does not improve.  Have also given you information for cardiology follow-up to schedule outpatient follow-up as needed.  Return to the ED if things change or worsen.

## 2020-11-29 NOTE — ED Triage Notes (Signed)
Patient presents due to tingling in her fingers that radiates up her arms. Tingling is more intensely felt on the right side. She also reports swelling in her right wrist. She also complains of pain in the lower mid chest.

## 2020-12-01 NOTE — Progress Notes (Signed)
Obstetrics and Gynecology Visit Return Patient Evaluation  Appointment Date: 11/28/2020  Primary Care Provider: [place] for [facility]  Chief Complaint: pap smear  History of Present Illness:  Briana Wade is a 50 y.o. presenting today desiring a pap smear. She states that she cannot remember the date of her last pap smear, but that is has been at least several years. She is additionally concerned that she is feeling a vaginal mass when she examines herself, stating that it has been present for several months and that it feels "almost like a hard knot." She denies any vaginal bleeding, pain, or discharge. She does endorse occasional abdominal cramping that she states feel like possible menstrual cramps, but that she does not bleed when these happen. She is s/p endometrial ablation performed in 2017, and states that her LMP was in March of 2022. She reports no problems with urination.    Review of Systems: as noted in the History of Present Illness.  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 Sweetman had no medications administered during this visit. Current Outpatient Medications  Medication Sig Dispense Refill   amLODipine (NORVASC) 5 MG tablet Take 1 tablet (5 mg total) by mouth daily. 30 tablet 3   diphenhydrAMINE (BENADRYL) 25 MG tablet Take 1 tablet (25 mg total) by mouth every 6 (six) hours as needed. 30 tablet 0   ibuprofen (ADVIL) 600 MG tablet Take 1 tablet (600 mg total) by mouth every 6 (six) hours as needed. 30 tablet 0   metoCLOPramide (REGLAN) 10 MG tablet Take 1 tablet (10 mg total) by mouth every 6 (six) hours. (Patient not taking: Reported on 09/07/2020) 30 tablet 0   metoprolol tartrate (LOPRESSOR) 25 MG tablet Take 25 mg by mouth 2 (two) times daily. (Patient not taking: Reported on 11/28/2020)     No current facility-administered medications for this visit.    Allergies: is allergic to lactose intolerance (gi).  Physical Exam:  BP (!) 147/90   Pulse 73   Ht 5\' 1"  (1.549 m)   Wt 215 lb 12.8 oz (97.9 kg)   BMI 40.78 kg/m  Body mass index is 40.78 kg/m. General appearance: Well nourished, well developed female in no acute distress.  Abdomen: diffusely non tender to palpation, non distended, and no masses, hernias Neuro/Psych:  Normal mood and affect.    Pelvic exam:  EGBUS: normal Vaginal vault: normal Cervix:  nttp Bimanual: normal, nttp uterus and adnexa  Assessment: pt stable  Plan:  1. GYN -Pt underwent cervical cancer screening via Papanicolaou test; advised pt that she will be contacted with results  -Bilateral screening mammogram discussed with pt and ordered  -No  vaginal masses or bulges palpated during bimanual exam, no concern at this time for cystocele, rectocele, or uterine prolapse.    RTC: PRN  Durene Romans MD Attending Center for [facility])

## 2020-12-02 LAB — CYTOLOGY - PAP
Chlamydia: NEGATIVE
Comment: NEGATIVE
Comment: NEGATIVE
Comment: NEGATIVE
Comment: NORMAL
Diagnosis: NEGATIVE
High risk HPV: NEGATIVE
Neisseria Gonorrhea: NEGATIVE
Trichomonas: NEGATIVE

## 2020-12-17 ENCOUNTER — Other Ambulatory Visit: Payer: Self-pay

## 2021-05-09 ENCOUNTER — Other Ambulatory Visit: Payer: Self-pay

## 2021-05-09 ENCOUNTER — Encounter (HOSPITAL_COMMUNITY): Payer: Self-pay

## 2021-05-09 ENCOUNTER — Emergency Department (HOSPITAL_COMMUNITY): Payer: 59

## 2021-05-09 ENCOUNTER — Emergency Department (HOSPITAL_COMMUNITY)
Admission: EM | Admit: 2021-05-09 | Discharge: 2021-05-10 | Discharge status: Against Medical Advice | Payer: 59 | Attending: Physician Assistant | Admitting: Physician Assistant

## 2021-05-09 DIAGNOSIS — R202 Paresthesia of skin: Secondary | ICD-10-CM | POA: Diagnosis present

## 2021-05-09 DIAGNOSIS — R200 Anesthesia of skin: Secondary | ICD-10-CM | POA: Diagnosis not present

## 2021-05-09 DIAGNOSIS — Z5321 Procedure and treatment not carried out due to patient leaving prior to being seen by health care provider: Secondary | ICD-10-CM | POA: Insufficient documentation

## 2021-05-09 NOTE — ED Triage Notes (Signed)
Patient presents to ED from home with c/o of right arm and leg numbness. Pt endorses tingling in right  fingertips x 3-4 days.  ?

## 2021-05-09 NOTE — ED Provider Triage Note (Signed)
Emergency Medicine Provider Triage Evaluation Note ? ?Dudley Major , a 51 y.o. female  was evaluated in triage.  Pt complains of paresthesias.  Patient states that about 1 week ago she began experiencing intermittent chest pain, headaches, as well as paresthesias in the fingers of the right hand.  Earlier today she began experiencing worsening paresthesias in the right arm and the leg.  No visual changes or weakness. ? ?Physical Exam  ?BP (!) 158/104 (BP Location: Left Arm)   Pulse 81   Temp 97.8 ?F (36.6 ?C) (Oral)   Resp 16   Ht '5\' 1"'$  (1.549 m)   Wt 98.9 kg   SpO2 98%   BMI 41.19 kg/m?  ?Gen:   Awake, no distress   ?Resp:  Normal effort  ?MSK:   Moves extremities without difficulty  ?Other:  A&O x3.  No facial droop.  Speaking clearly and coherently.  Strength is 5/5 in all 4 extremities.  Distal sensation intact. ? ?Medical Decision Making  ?Medically screening exam initiated at 11:14 PM.  Appropriate orders placed.  LAPORSHA GREALISH was informed that the remainder of the evaluation will be completed by another provider, this initial triage assessment does not replace that evaluation, and the importance of remaining in the ED until their evaluation is complete. ?  ?Rayna Sexton, PA-C ?05/09/21 2318 ? ?

## 2021-05-10 LAB — COMPREHENSIVE METABOLIC PANEL
ALT: 17 U/L (ref 0–44)
AST: 18 U/L (ref 15–41)
Albumin: 4.1 g/dL (ref 3.5–5.0)
Alkaline Phosphatase: 91 U/L (ref 38–126)
Anion gap: 10 (ref 5–15)
BUN: 16 mg/dL (ref 6–20)
CO2: 25 mmol/L (ref 22–32)
Calcium: 9.3 mg/dL (ref 8.9–10.3)
Chloride: 106 mmol/L (ref 98–111)
Creatinine, Ser: 0.71 mg/dL (ref 0.44–1.00)
GFR, Estimated: >60 mL/min (ref >60)
Glucose, Bld: 108 mg/dL — ABNORMAL HIGH (ref 70–99)
Potassium: 4.2 mmol/L (ref 3.5–5.1)
Sodium: 141 mmol/L (ref 135–145)
Total Bilirubin: 0.3 mg/dL (ref 0.3–1.2)
Total Protein: 8.2 g/dL — ABNORMAL HIGH (ref 6.5–8.1)

## 2021-05-10 LAB — CBC WITH DIFFERENTIAL/PLATELET
Abs Immature Granulocytes: 0.05 10*3/uL (ref 0.00–0.07)
Basophils Absolute: 0 10*3/uL (ref 0.0–0.1)
Basophils Relative: 1 %
Eosinophils Absolute: 0.3 10*3/uL (ref 0.0–0.5)
Eosinophils Relative: 4 %
HCT: 40 % (ref 36.0–46.0)
Hemoglobin: 12.7 g/dL (ref 12.0–15.0)
Immature Granulocytes: 1 %
Lymphocytes Relative: 34 %
Lymphs Abs: 2.3 10*3/uL (ref 0.7–4.0)
MCH: 27 pg (ref 26.0–34.0)
MCHC: 31.8 g/dL (ref 30.0–36.0)
MCV: 84.9 fL (ref 80.0–100.0)
Monocytes Absolute: 0.5 10*3/uL (ref 0.1–1.0)
Monocytes Relative: 7 %
Neutro Abs: 3.6 10*3/uL (ref 1.7–7.7)
Neutrophils Relative %: 53 %
Platelets: 333 10*3/uL (ref 150–400)
RBC: 4.71 MIL/uL (ref 3.87–5.11)
RDW: 14 % (ref 11.5–15.5)
WBC: 6.7 10*3/uL (ref 4.0–10.5)
nRBC: 0 % (ref 0.0–0.2)

## 2021-05-10 LAB — TROPONIN I (HIGH SENSITIVITY): Troponin I (High Sensitivity): 3 ng/L (ref <18)

## 2021-05-22 ENCOUNTER — Other Ambulatory Visit: Payer: Self-pay | Admitting: Physician Assistant

## 2021-05-22 DIAGNOSIS — N644 Mastodynia: Secondary | ICD-10-CM

## 2021-06-13 ENCOUNTER — Emergency Department (HOSPITAL_COMMUNITY)
Admission: EM | Admit: 2021-06-13 | Discharge: 2021-06-14 | Discharge status: Against Medical Advice | Payer: 59 | Attending: Emergency Medicine | Admitting: Emergency Medicine

## 2021-06-13 ENCOUNTER — Encounter (HOSPITAL_COMMUNITY): Payer: Self-pay | Admitting: Emergency Medicine

## 2021-06-13 ENCOUNTER — Emergency Department (HOSPITAL_COMMUNITY): Payer: 59

## 2021-06-13 DIAGNOSIS — Z86010 Personal history of colonic polyps: Secondary | ICD-10-CM | POA: Diagnosis not present

## 2021-06-13 DIAGNOSIS — J45909 Unspecified asthma, uncomplicated: Secondary | ICD-10-CM | POA: Diagnosis not present

## 2021-06-13 DIAGNOSIS — I10 Essential (primary) hypertension: Secondary | ICD-10-CM | POA: Insufficient documentation

## 2021-06-13 DIAGNOSIS — M79601 Pain in right arm: Secondary | ICD-10-CM | POA: Diagnosis present

## 2021-06-13 DIAGNOSIS — M7521 Bicipital tendinitis, right shoulder: Secondary | ICD-10-CM | POA: Diagnosis not present

## 2021-06-13 DIAGNOSIS — R079 Chest pain, unspecified: Secondary | ICD-10-CM | POA: Diagnosis not present

## 2021-06-13 DIAGNOSIS — Z79899 Other long term (current) drug therapy: Secondary | ICD-10-CM | POA: Insufficient documentation

## 2021-06-13 DIAGNOSIS — Z5321 Procedure and treatment not carried out due to patient leaving prior to being seen by health care provider: Secondary | ICD-10-CM | POA: Diagnosis not present

## 2021-06-13 LAB — BASIC METABOLIC PANEL
Anion gap: 6 (ref 5–15)
BUN: 15 mg/dL (ref 6–20)
CO2: 30 mmol/L (ref 22–32)
Calcium: 9.1 mg/dL (ref 8.9–10.3)
Chloride: 103 mmol/L (ref 98–111)
Creatinine, Ser: 1.2 mg/dL — ABNORMAL HIGH (ref 0.44–1.00)
GFR, Estimated: 55 mL/min — ABNORMAL LOW (ref >60)
Glucose, Bld: 132 mg/dL — ABNORMAL HIGH (ref 70–99)
Potassium: 4.1 mmol/L (ref 3.5–5.1)
Sodium: 139 mmol/L (ref 135–145)

## 2021-06-13 LAB — CBC
HCT: 36.3 % (ref 36.0–46.0)
Hemoglobin: 11.5 g/dL — ABNORMAL LOW (ref 12.0–15.0)
MCH: 26.4 pg (ref 26.0–34.0)
MCHC: 31.7 g/dL (ref 30.0–36.0)
MCV: 83.4 fL (ref 80.0–100.0)
Platelets: 334 10*3/uL (ref 150–400)
RBC: 4.35 MIL/uL (ref 3.87–5.11)
RDW: 14.1 % (ref 11.5–15.5)
WBC: 7.3 10*3/uL (ref 4.0–10.5)
nRBC: 0 % (ref 0.0–0.2)

## 2021-06-13 LAB — TROPONIN I (HIGH SENSITIVITY): Troponin I (High Sensitivity): <2 ng/L (ref <18)

## 2021-06-13 NOTE — ED Provider Triage Note (Signed)
Emergency Medicine Provider Triage Evaluation Note  Briana Wade , a 51 y.o. female  was evaluated in triage.  Pt complains of right arm pain and chest pain.  Patient reports that earlier this morning she developed right arm pain that then gradually transition to her chest.  Patient describes the chest pain as sharp and stabbing in nature.  Patient denies any exertional worsening of chest pain, denies shortness of breath.  Patient denies history of blood clots.  Patient believes that her legs are also swelling.  Patient denies abdominal pain, fevers, nausea or vomiting.  Review of Systems  Positive:  Negative:   Physical Exam  There were no vitals taken for this visit. Gen:   Awake, no distress   Resp:  Normal effort  MSK:   Moves extremities without difficulty  Other:    Medical Decision Making  Medically screening exam initiated at 9:08 PM.  Appropriate orders placed.  REE ALCALDE was informed that the remainder of the evaluation will be completed by another provider, this initial triage assessment does not replace that evaluation, and the importance of remaining in the ED until their evaluation is complete.     Azucena Cecil, PA-C 06/13/21 2109

## 2021-06-13 NOTE — ED Triage Notes (Addendum)
Pt reports sharp pains in chest starting for weeks. Tingling in arm R arm also for weeks. Occasionally it will also occur in left elbow and shoulder. Pain she describes as classic nerve pain. She has not taken any meds for it. Endorses headache.

## 2021-06-14 ENCOUNTER — Other Ambulatory Visit: Payer: Self-pay

## 2021-06-14 ENCOUNTER — Encounter (HOSPITAL_BASED_OUTPATIENT_CLINIC_OR_DEPARTMENT_OTHER): Payer: Self-pay | Admitting: Emergency Medicine

## 2021-06-14 ENCOUNTER — Emergency Department (HOSPITAL_BASED_OUTPATIENT_CLINIC_OR_DEPARTMENT_OTHER): Payer: 59

## 2021-06-14 ENCOUNTER — Emergency Department (HOSPITAL_BASED_OUTPATIENT_CLINIC_OR_DEPARTMENT_OTHER)
Admission: EM | Admit: 2021-06-14 | Discharge: 2021-06-14 | Disposition: A | Discharge status: Home / Self Care | Payer: 59 | Source: Home / Self Care | Attending: Emergency Medicine | Admitting: Emergency Medicine

## 2021-06-14 DIAGNOSIS — J45909 Unspecified asthma, uncomplicated: Secondary | ICD-10-CM | POA: Insufficient documentation

## 2021-06-14 DIAGNOSIS — Z86010 Personal history of colonic polyps: Secondary | ICD-10-CM | POA: Insufficient documentation

## 2021-06-14 DIAGNOSIS — M7521 Bicipital tendinitis, right shoulder: Secondary | ICD-10-CM | POA: Insufficient documentation

## 2021-06-14 DIAGNOSIS — I10 Essential (primary) hypertension: Secondary | ICD-10-CM | POA: Insufficient documentation

## 2021-06-14 DIAGNOSIS — M79601 Pain in right arm: Secondary | ICD-10-CM

## 2021-06-14 DIAGNOSIS — Z79899 Other long term (current) drug therapy: Secondary | ICD-10-CM | POA: Insufficient documentation

## 2021-06-14 MED ORDER — KETOROLAC TROMETHAMINE 30 MG/ML IJ SOLN
30.0000 mg | Freq: Once | INTRAMUSCULAR | Status: AC
Start: 2021-06-14 — End: 2021-06-14
  Administered 2021-06-14: 30 mg via INTRAMUSCULAR

## 2021-06-14 MED ORDER — IBUPROFEN 600 MG PO TABS: 600.0000 mg | ORAL_TABLET | Freq: Four times a day (QID) | ORAL | 0 refills | Status: DC | PRN

## 2021-06-14 MED ORDER — ACETAMINOPHEN 325 MG PO TABS: 650.0000 mg | ORAL_TABLET | Freq: Four times a day (QID) | ORAL | 0 refills | Status: DC | PRN

## 2021-06-14 MED ORDER — KETOROLAC TROMETHAMINE 30 MG/ML IJ SOLN
30.0000 mg | Freq: Once | INTRAMUSCULAR | Status: DC
  Filled 2021-06-14: qty 1

## 2021-06-14 MED ORDER — METHOCARBAMOL 500 MG PO TABS: 1000.0000 mg | ORAL_TABLET | Freq: Two times a day (BID) | ORAL | 0 refills | Status: AC

## 2021-06-14 NOTE — ED Notes (Signed)
Pt is not in the room 

## 2021-06-14 NOTE — Discharge Instructions (Addendum)
The x-ray of your shoulder did not show evidence of a fracture   It was a pleasure caring for you today in the emergency department.  Please return to the emergency department for any worsening or worrisome symptoms.

## 2021-06-14 NOTE — ED Provider Notes (Addendum)
[facility]PARTMENT Provider Note   CSN: [id number] Arrival date & time: 06/14/21  0046     History  Chief Complaint  Patient presents with   Arm Pain   Tingling    Briana Wade is a 51 y.o. female.  Patient as above with significant medical history as below, including diverticulitis, hypertension, IBS, chronic right arm weakness, who presents to the ED with complaint of right arm discomfort.  Patient was seen at [facility] long emergency department earlier this evening but she left after triage due to the prolonged wait.  While there she did receive lab work-up, EKG and chest x-ray which all are reassuring.  Her renal function was mildly increased from prior.  Electrolytes stable.  Patient reports this morning she began to have sharp pain to her right upper arm, humerus area.  Some mild mild chest tightness intermittently.  No significant dyspnea.  She has tingling to her fingertips bilateral hands.  Denies recent falls or injuries.  No pain to her neck.  No numbness.  No vision or hearing changes.  No fevers or chills.     Past Medical History:  Diagnosis Date   Abdominal pain, unspecified site    Allergy    Anemia    Asthma    Chlamydia    Diverticulitis    Esophageal reflux    Hiatal hernia    Hypertension    IBS (irritable bowel syndrome)    Insomnia    Lactose intolerance    Migraine equivalent 08/2014   minor right arm weakness in [facility]. neg CVA on imaging.    Other constipation    Personal history of colonic polyps 09/12/2010   tubular adenoma   Trichimoniasis    Tubular adenoma of colon    Umbilical hernia    Uterine fibroid    Uterine leiomyoma     Past Surgical History:  Procedure Laterality Date   APPENDECTOMY     COLONOSCOPY     DILATION AND CURETTAGE OF UTERUS     x2 for EAB   HYSTEROSCOPY WITH NOVASURE N/A 12/17/2015   Procedure: HYSTEROSCOPY WITH NOVASURE;  Surgeon: Aletha Halim, MD;  Location: [facility];  Service:  Gynecology;  Laterality: N/A;   TUBAL LIGATION       The history is provided by the patient. No language interpreter was used.  Arm Pain Pertinent negatives include no chest pain, no abdominal pain, no headaches and no shortness of breath.      Home Medications Prior to Admission medications   Medication Sig Start Date End Date Taking? Authorizing Provider  acetaminophen (TYLENOL) 325 MG tablet Take 2 tablets (650 mg total) by mouth every 6 (six) hours as needed. 06/14/21  Yes Wynona Dove A, DO  ibuprofen (ADVIL) 600 MG tablet Take 1 tablet (600 mg total) by mouth every 6 (six) hours as needed. 06/14/21  Yes Jeanell Sparrow, DO  methocarbamol (ROBAXIN) 500 MG tablet Take 2 tablets (1,000 mg total) by mouth 2 (two) times daily for 5 days. 06/14/21 06/19/21 Yes Wynona Dove A, DO  amLODipine (NORVASC) 5 MG tablet Take 1 tablet (5 mg total) by mouth daily. 08/05/20 12/03/20  Cantwell, Celeste C, PA-C  diphenhydrAMINE (BENADRYL) 25 MG tablet Take 1 tablet (25 mg total) by mouth every 6 (six) hours as needed. 08/08/20   Charlesetta Shanks, MD  metoCLOPramide (REGLAN) 10 MG tablet Take 1 tablet (10 mg total) by mouth every 6 (six) hours. Patient not taking: Reported on 09/07/2020 08/08/20  Charlesetta Shanks, MD  metoprolol tartrate (LOPRESSOR) 25 MG tablet Take 25 mg by mouth 2 (two) times daily. Patient not taking: Reported on 11/28/2020 04/01/20   [provider]      Allergies    Lactose intolerance (gi)    Review of Systems   Review of Systems  Constitutional:  Negative for chills and fever.  HENT:  Negative for facial swelling and trouble swallowing.   Eyes:  Negative for photophobia and visual disturbance.  Respiratory:  Positive for chest tightness. Negative for cough and shortness of breath.   Cardiovascular:  Negative for chest pain and palpitations.  Gastrointestinal:  Negative for abdominal pain, nausea and vomiting.  Endocrine: Negative for polydipsia and polyuria.  Genitourinary:   Negative for difficulty urinating and hematuria.  Musculoskeletal:  Positive for arthralgias. Negative for gait problem and joint swelling.  Skin:  Negative for pallor and rash.  Neurological:  Negative for syncope and headaches.  Psychiatric/Behavioral:  Negative for agitation and confusion.    Physical Exam Updated Vital Signs BP (!) 165/94 (BP Location: Right Arm)   Pulse 60   Resp 18   Ht '5\' 1"'$  (1.549 m)   Wt 99.8 kg   SpO2 100%   BMI 41.57 kg/m  Physical Exam Vitals and nursing note reviewed.  Constitutional:      General: She is not in acute distress.    Appearance: Normal appearance.  HENT:     Head: Normocephalic and atraumatic.     Right Ear: External ear normal.     Left Ear: External ear normal.     Nose: Nose normal.     Mouth/Throat:     Mouth: Mucous membranes are moist.  Eyes:     General: No scleral icterus.       Right eye: No discharge.        Left eye: No discharge.  Cardiovascular:     Rate and Rhythm: Normal rate and regular rhythm.     Pulses: Normal pulses.     Heart sounds: Normal heart sounds.  Pulmonary:     Effort: Pulmonary effort is normal. No respiratory distress.     Breath sounds: Normal breath sounds.  Abdominal:     General: Abdomen is flat.     Palpations: Abdomen is soft.     Tenderness: There is no abdominal tenderness.  Musculoskeletal:        General: Normal range of motion.     Cervical back: Normal range of motion.     Right lower leg: No edema.     Left lower leg: No edema.     Comments: Mild discomfort to right humerus area. No external evidence of trauma. Pulses equal bilateral upper extremities. Strength equal bilateral upper extremities Capillary refill is brisk, extremities warm well perfused. Mild discomfort with raising her right arm. Mild discomfort at biceps tendon groove to right  Skin:    General: Skin is warm and dry.     Capillary Refill: Capillary refill takes less than 2 seconds.  Neurological:      Mental Status: She is alert.  Psychiatric:        Mood and Affect: Mood normal.        Behavior: Behavior normal.    ED Results / Procedures / Treatments   Labs (all labs ordered are listed, but only abnormal results are displayed) Labs Reviewed - No data to display  EKG None  Radiology DG Chest [facility] 1 View  Result Date: 06/13/2021 CLINICAL DATA:  Chest pain EXAM: PORTABLE CHEST 1 VIEW COMPARISON:  None Available. FINDINGS: The heart size and mediastinal contours are within normal limits. Both lungs are clear. The visualized skeletal structures are unremarkable. IMPRESSION: No active disease. Electronically Signed   By: Fidela Salisbury M.D.   On: 06/13/2021 21:51    Procedures Procedures    Medications Ordered in ED Medications  ketorolac (TORADOL) 30 MG/ML injection 30 mg (30 mg Intramuscular Given 06/14/21 0319)    ED Course/ Medical Decision Making/ A&P                           Medical Decision Making Amount and/or Complexity of Data Reviewed Radiology: ordered.  Risk OTC drugs. Prescription drug management.    CC: Right arm pain, chest pain  This patient presents to the Emergency Department for the above complaint. This involves an extensive number of treatment options and is a complaint that carries with it a high risk of complications and morbidity. Vital signs were reviewed. Serious etiologies considered.  DDx includes but not limited to, ACS, pneumothorax, pneumonia, MSK, strain, sprain, tendinitis, fx, neuropathy, electrolyte disturbance, other acute etiologies.  Record review:  Previous records obtained and reviewed  Recent ED visit, prior labs and imaging, prior notes  Additional history obtained from N/A  Medical and surgical history as noted above.   Work up as above, notable for:  Labs imaging results that were available during my care of the patient were visualized by me and considered in my medical decision making.   Reviewed labs from earlier  in the evening, chest x-ray, EKG.  Will not repeat these given that they were obtained just prior to her arrival to this emergency department.  I ordered imaging studies which included right shoulder x-ray and I visualized the imaging. No evidence of acute fracture or dislocation on imaging. Formal radiology read is pending as there was some difficulty with the image transmitting in PACS.   Patient has no chest pain at this time.  Favor atypical chest pain, similar to prior.   Given pain at biceps tendon groove on the right I am suspicious for biceps tendinitis.  Management: Shoulder x-ray, right shoulder sling, give Toradol  Reassessment:  Patient feeling better. Start patient on NSAID, muscle relaxer, sling. No heavy lifting RUE for next few days Have her follow-up with PCP.    The patient's chest pain is not suggestive of pulmonary embolus, cardiac ischemia, aortic dissection, pericarditis, myocarditis, pulmonary embolism, pneumothorax, pneumonia, Zoster, or esophageal perforation, or other serious etiology.  Historically not abrupt in onset, tearing or ripping, pulses symmetric. EKG nonspecific for ischemia/infarction. No dysrhythmias, brugada, WPW, prolonged QT noted.   Troponin negative x1 high sensitivity (<2, onset pain around 12 hours prior). CXR reviewed. Labs without demonstration of acute pathology unless otherwise noted above. Low HEART Score: 0-3 points (0.9-1.7% risk of MACE).]   Given the extremely low risk of these diagnoses further testing and evaluation for these possibilities does not appear to be indicated at this time. Patient in no distress and overall condition improved here in the ED. Detailed discussions were had with the patient regarding current findings, and need for close f/u with PCP or on call doctor. The patient has been instructed to return immediately if the symptoms worsen in any way for re-evaluation. Patient verbalized understanding and is in agreement  with current care plan. All questions answered prior to discharge.  Social determinants of health include -  Social History   Socioeconomic History   Marital status: Married    Spouse name: Not on file   Number of children: 3   Years of education: Not on file   Highest education level: Not on file  Occupational History   Occupation: Unemployed  Tobacco Use   Smoking status: Never   Smokeless tobacco: Never  Vaping Use   Vaping Use: Never used  Substance and Sexual Activity   Alcohol use: No   Drug use: No   Sexual activity: Yes    Birth control/protection: Surgical  Other Topics Concern   Not on file  Social History Narrative   Not on file   Social Determinants of Health   Financial Resource Strain: Not on file  Food Insecurity: Food Insecurity Present   Worried About [place] in the Last Year: Sometimes true   [place] in the Last Year: Sometimes true  Transportation Needs: No Transportation Needs   Lack of Transportation (Medical): No   Lack of Transportation (Non-Medical): No  Physical Activity: Not on file  Stress: Not on file  Social Connections: Not on file  Intimate Partner Violence: Not on file      This chart was dictated using voice recognition software.  Despite best efforts to proofread,  errors can occur which can change the documentation meaning.         Final Clinical Impression(s) / ED Diagnoses Final diagnoses:  Right arm pain  Biceps tendinitis of right upper extremity    Rx / DC Orders ED Discharge Orders          Ordered    ibuprofen (ADVIL) 600 MG tablet  Every 6 hours PRN        06/14/21 0258    acetaminophen (TYLENOL) 325 MG tablet  Every 6 hours PRN        06/14/21 0258    methocarbamol (ROBAXIN) 500 MG tablet  2 times daily        06/14/21 0258              Jeanell Sparrow, DO 06/14/21 [place], [place], DO 06/14/21 [phone]

## 2021-06-14 NOTE — ED Triage Notes (Addendum)
Tinging in right hand X 2 weeks. Pain up arm starting today. Pt just left [facility] ED had blood work, EKG, and Chest Xray.

## 2021-08-16 ENCOUNTER — Emergency Department (HOSPITAL_BASED_OUTPATIENT_CLINIC_OR_DEPARTMENT_OTHER): Payer: 59

## 2021-08-16 ENCOUNTER — Other Ambulatory Visit: Payer: Self-pay

## 2021-08-16 ENCOUNTER — Encounter (HOSPITAL_BASED_OUTPATIENT_CLINIC_OR_DEPARTMENT_OTHER): Payer: Self-pay | Admitting: Emergency Medicine

## 2021-08-16 ENCOUNTER — Emergency Department (HOSPITAL_BASED_OUTPATIENT_CLINIC_OR_DEPARTMENT_OTHER)
Admission: EM | Admit: 2021-08-16 | Discharge: 2021-08-16 | Disposition: A | Discharge status: Home / Self Care | Payer: 59 | Attending: Emergency Medicine | Admitting: Emergency Medicine

## 2021-08-16 DIAGNOSIS — R1013 Epigastric pain: Secondary | ICD-10-CM | POA: Insufficient documentation

## 2021-08-16 DIAGNOSIS — R072 Precordial pain: Secondary | ICD-10-CM | POA: Diagnosis not present

## 2021-08-16 DIAGNOSIS — M542 Cervicalgia: Secondary | ICD-10-CM | POA: Diagnosis not present

## 2021-08-16 DIAGNOSIS — Z79899 Other long term (current) drug therapy: Secondary | ICD-10-CM | POA: Diagnosis not present

## 2021-08-16 DIAGNOSIS — R739 Hyperglycemia, unspecified: Secondary | ICD-10-CM | POA: Diagnosis not present

## 2021-08-16 DIAGNOSIS — I10 Essential (primary) hypertension: Secondary | ICD-10-CM | POA: Insufficient documentation

## 2021-08-16 DIAGNOSIS — R0789 Other chest pain: Secondary | ICD-10-CM

## 2021-08-16 LAB — CBC
HCT: 38.3 % (ref 36.0–46.0)
Hemoglobin: 12 g/dL (ref 12.0–15.0)
MCH: 26 pg (ref 26.0–34.0)
MCHC: 31.3 g/dL (ref 30.0–36.0)
MCV: 83.1 fL (ref 80.0–100.0)
Platelets: 304 10*3/uL (ref 150–400)
RBC: 4.61 MIL/uL (ref 3.87–5.11)
RDW: 14.8 % (ref 11.5–15.5)
WBC: 5.2 10*3/uL (ref 4.0–10.5)
nRBC: 0 % (ref 0.0–0.2)

## 2021-08-16 LAB — BASIC METABOLIC PANEL
Anion gap: 6 (ref 5–15)
BUN: 11 mg/dL (ref 6–20)
CO2: 30 mmol/L (ref 22–32)
Calcium: 9.2 mg/dL (ref 8.9–10.3)
Chloride: 102 mmol/L (ref 98–111)
Creatinine, Ser: 0.87 mg/dL (ref 0.44–1.00)
GFR, Estimated: >60 mL/min (ref >60)
Glucose, Bld: 100 mg/dL — ABNORMAL HIGH (ref 70–99)
Potassium: 4.1 mmol/L (ref 3.5–5.1)
Sodium: 138 mmol/L (ref 135–145)

## 2021-08-16 LAB — TROPONIN I (HIGH SENSITIVITY): Troponin I (High Sensitivity): <2 ng/L (ref <18)

## 2021-08-16 LAB — LIPASE, BLOOD: Lipase: 36 U/L (ref 11–51)

## 2021-08-16 MED ORDER — FAMOTIDINE 20 MG PO TABS
20.0000 mg | ORAL_TABLET | Freq: Once | ORAL | Status: AC
  Administered 2021-08-16: 20 mg via ORAL
  Filled 2021-08-16: qty 1

## 2021-08-16 MED ORDER — ALUM & MAG HYDROXIDE-SIMETH 200-200-20 MG/5ML PO SUSP
30.0000 mL | Freq: Once | ORAL | Status: DC
  Filled 2021-08-16: qty 30

## 2021-08-16 MED ORDER — IOHEXOL 300 MG/ML  SOLN: 100.0000 mL | Freq: Once | INTRAMUSCULAR | Status: DC | PRN

## 2021-08-16 MED ORDER — FAMOTIDINE IN NACL 20-0.9 MG/50ML-% IV SOLN
20.0000 mg | Freq: Once | INTRAVENOUS | Status: DC
Start: 2021-08-16 — End: 2021-08-16

## 2021-08-16 NOTE — ED Provider Notes (Signed)
[facility] EMERGENCY DEPARTMENT Provider Note   CSN: [id number] Arrival date & time: 08/16/21  1253     History  Chief Complaint  Patient presents with   Abdominal Pain   Neck Pain    Briana Wade is a 51 y.o. female with a past medical history of hypertension who presents to the emergency department complaining of epigastric pain onset 1-1.5 weeks ago.  Has a history of GERD and does not take medications for GERD.  Also has associated left-sided neck/jaw pain, sternal chest pain.  Notes that her chest pain is not more noticeable at rest versus with exacerbation.  No meds tried prior to arrival.  Denies shortness of breath, nausea, vomiting.  Denies past medical history of MI, cardiac catheterization, diabetes, stent placement.  The history is provided by the patient. No language interpreter was used.       Home Medications Prior to Admission medications   Medication Sig Start Date End Date Taking? Authorizing Provider  acetaminophen (TYLENOL) 325 MG tablet Take 2 tablets (650 mg total) by mouth every 6 (six) hours as needed. 06/14/21   Jeanell Sparrow, DO  amLODipine (NORVASC) 5 MG tablet Take 1 tablet (5 mg total) by mouth daily. 08/05/20 12/03/20  Cantwell, Celeste C, PA-C  diphenhydrAMINE (BENADRYL) 25 MG tablet Take 1 tablet (25 mg total) by mouth every 6 (six) hours as needed. 08/08/20   Charlesetta Shanks, MD  ibuprofen (ADVIL) 600 MG tablet Take 1 tablet (600 mg total) by mouth every 6 (six) hours as needed. 06/14/21   Jeanell Sparrow, DO  metoCLOPramide (REGLAN) 10 MG tablet Take 1 tablet (10 mg total) by mouth every 6 (six) hours. Patient not taking: Reported on 09/07/2020 08/08/20   Charlesetta Shanks, MD  metoprolol tartrate (LOPRESSOR) 25 MG tablet Take 25 mg by mouth 2 (two) times daily. Patient not taking: Reported on 11/28/2020 04/01/20   [provider]      Allergies    Lactose intolerance (gi)    Review of Systems   Review of Systems  Respiratory:   Negative for shortness of breath.   Cardiovascular:  Positive for chest pain.  Gastrointestinal:  Positive for abdominal pain. Negative for nausea and vomiting.  All other systems reviewed and are negative.   Physical Exam Updated Vital Signs BP (!) 135/91 (BP Location: Left Arm)   Pulse 70   Temp 98 F (36.7 C) (Oral)   Resp 16   Ht '5\' 1"'$  (1.549 m)   Wt 99.8 kg   SpO2 100%   BMI 41.57 kg/m  Physical Exam Vitals and nursing note reviewed.  Constitutional:      General: She is not in acute distress.    Appearance: She is not diaphoretic.  HENT:     Head: Normocephalic and atraumatic.     Mouth/Throat:     Pharynx: No oropharyngeal exudate.  Eyes:     General: No scleral icterus.    Conjunctiva/sclera: Conjunctivae normal.  Cardiovascular:     Rate and Rhythm: Normal rate and regular rhythm.     Pulses: Normal pulses.     Heart sounds: Normal heart sounds.  Pulmonary:     Effort: Pulmonary effort is normal. No respiratory distress.     Breath sounds: Normal breath sounds. No wheezing.  Abdominal:     General: Bowel sounds are normal.     Palpations: Abdomen is soft. There is no mass.     Tenderness: There is no abdominal tenderness. There is  no guarding or rebound.  Musculoskeletal:        General: Normal range of motion.     Cervical back: Normal range of motion and neck supple.  Skin:    General: Skin is warm and dry.  Neurological:     Mental Status: She is alert.  Psychiatric:        Behavior: Behavior normal.     ED Results / Procedures / Treatments   Labs (all labs ordered are listed, but only abnormal results are displayed) Labs Reviewed  BASIC METABOLIC PANEL - Abnormal; Notable for the following components:      Result Value   Glucose, Bld 100 (*)    All other components within normal limits  CBC  LIPASE, BLOOD  TROPONIN I (HIGH SENSITIVITY)  TROPONIN I (HIGH SENSITIVITY)  TROPONIN I (HIGH SENSITIVITY)    EKG EKG  Interpretation  Date/Time:  Saturday August 16 2021 13:19:19 EDT Ventricular Rate:  63 PR Interval:  165 QRS Duration: 83 QT Interval:  463 QTC Calculation: 474 R Axis:   -13 Text Interpretation: Sinus rhythm Left ventricular hypertrophy Probable anterior infarct, age indeterminate Confirmed by Regan Lemming ([id number]) on 08/16/2021 1:30:21 PM  Radiology DG Chest 2 View  Result Date: 08/16/2021 CLINICAL DATA:  Chest pain, epigastric pain EXAM: CHEST - 2 VIEW COMPARISON:  06/13/2021 FINDINGS: The heart size and mediastinal contours are within normal limits. Both lungs are clear. The visualized skeletal structures are unremarkable. IMPRESSION: No active cardiopulmonary disease. Electronically Signed   By: Elmer Picker M.D.   On: 08/16/2021 13:49    Procedures Procedures    Medications Ordered in ED Medications  iohexol (OMNIPAQUE) 300 MG/ML solution 100 mL (has no administration in time range)  famotidine (PEPCID) tablet 20 mg (20 mg Oral Given 08/16/21 1737)    ED Course/ Medical Decision Making/ A&P Clinical Course as of 08/16/21 1833  Sat Aug 16, 2021  1651 Pt re-evaluated and discussed with patient lab and imaging findings. Discussed with patient treatment plan. Answered all available questions. [SB]  6384 Notified by radiology that patient is ready to leave and doesn't want her scan at this time.  [SB]  1723 Discussed with patient treatment plan. Pt notes that she is ready to leave at this time.  [SB]  1733 Discussed with patient with RN present regarding risk of leaving the emergency department prior to completion of full work-up.  Again discussed with patient the reason for obtaining CT scan to rule out any intra-abdominal findings.  As well as treatment with a GI cocktail to rule out GERD as etiology of patient's symptoms.  Also discussed with patient that repeat troponin has not been obtained as of yet to fully rule out ACS.  Patient agreeable and acknowledges risk factors of  leaving the emergency department at this time.  [SB]    Clinical Course User Index [SB] Oaklynn Stierwalt A, PA-C                           Medical Decision Making Amount and/or Complexity of Data Reviewed Labs: ordered. Radiology: ordered.  Risk Prescription drug management.   Patient presents to the emergency department with epigastric pain onset 1-1.5 weeks ago.  Has a history of GERD however has not taken any medications for GERD.  Also has left-sided neck/jaw pain, sternal chest pain.  No meds tried prior to arrival.  Denies history of MI, cardiac catheterization, diabetes, stents.  Patient afebrile, not tachycardic  or hypoxic.  On exam patient with tenderness to palpation noted to sternal chest wall.  Tenderness to palpation noted to epigastric region.  No acute cardiovascular or respiratory exam findings.  Differential diagnosis includes cholecystitis, pancreatitis, GERD, ACS.   Labs:  I ordered, and personally interpreted labs.  The pertinent results include:  Initial troponin <2. CBC unremarkable BMP with slightly elevated glucose at 100 and unremarkable Lipase unremarkable  Imaging: I ordered imaging studies including CXR I independently visualized and interpreted imaging which showed no acute cardiopulmonary findings. I agree with the radiologist interpretation  Medications:  I ordered medication including Pepcid for symptom management.  I have reviewed the patients home medicines and have made adjustments as needed   Disposition: At this time unable to rule out intra-abdominal etiology.  ACS less likely however delta troponin not obtained due to patient leaving the emergency department and not wanting to wait for delta troponin.  Discussed with patient risk for leaving the emergency department prior to full work-up consistent of CT abdomen pelvis, GI cocktail and fluids.  Discussed with patient that we are unable to fully rule out ACS as cause of patient's symptoms.  Also  spoke with patient with RN present regarding risk of leaving prior to complete work-up could include but not limited to worsening symptoms and notably death.  Patient acknowledged the risk with leaving prior to work-up being completed at this time.  See ED course for further information.  Ambulatory referral to cardiology given today. Discussed with patient importance of follow up with primary care provider. Supportive care measures and strict return precautions discussed with patient at bedside. Pt acknowledges and verbalizes understanding. Pt appears safe for discharge. Follow up as indicated in discharge paperwork.   This chart was dictated using voice recognition software, Dragon. Despite the best efforts of this provider to proofread and correct errors, errors may still occur which can change documentation meaning.  Final Clinical Impression(s) / ED Diagnoses Final diagnoses:  Atypical chest pain  Epigastric abdominal pain    Rx / DC Orders ED Discharge Orders          Ordered    Ambulatory referral to Cardiology       Comments: If you have not heard from the Cardiology office within the next 72 hours please call [phone].   08/16/21 1733              Braylen Staller A, PA-C 08/16/21 1836    Regan Lemming, MD 08/17/21 1256

## 2021-08-16 NOTE — ED Triage Notes (Signed)
Pt reports epigastric pain and pain in neck/jaw x 1 wk

## 2021-08-16 NOTE — ED Notes (Signed)
2 x unsuccessful IV attempts. Pt tolerated well

## 2021-08-16 NOTE — Discharge Instructions (Addendum)
It was a pleasure taking care of you today!  Your labs and chest x-ray today were overall unremarkable.  Attached is information for the cardiologist, they will call and set up a follow-up appointment regarding today's ED visit.  Call your primary care provider and set up a follow-up appointment regarding today's ED visit.  Return to the ED if you are experiencing increasing/worsening chest pain, trouble breathing, worsening symptoms.

## 2021-08-16 NOTE — ED Notes (Signed)
Attempted IV x 2, unable to obtain due to tiny veins.  EDP notified

## 2021-08-17 ENCOUNTER — Other Ambulatory Visit: Payer: Self-pay

## 2021-08-17 ENCOUNTER — Emergency Department (HOSPITAL_COMMUNITY)
Admission: EM | Admit: 2021-08-17 | Discharge: 2021-08-17 | Discharge status: Against Medical Advice | Payer: 59 | Attending: Emergency Medicine | Admitting: Emergency Medicine

## 2021-08-17 ENCOUNTER — Encounter (HOSPITAL_COMMUNITY): Payer: Self-pay

## 2021-08-17 DIAGNOSIS — R1013 Epigastric pain: Secondary | ICD-10-CM | POA: Diagnosis not present

## 2021-08-17 DIAGNOSIS — Z5329 Procedure and treatment not carried out because of patient's decision for other reasons: Secondary | ICD-10-CM | POA: Insufficient documentation

## 2021-08-17 DIAGNOSIS — R079 Chest pain, unspecified: Secondary | ICD-10-CM | POA: Diagnosis present

## 2021-08-17 NOTE — Discharge Instructions (Signed)
Return back to the ER at any time if your symptoms worsen or if you change your mind and would like additional medical care.

## 2021-08-17 NOTE — ED Provider Triage Note (Cosign Needed)
Emergency Medicine Provider Triage Evaluation Note  Briana Wade , a 51 y.o. female  was evaluated in triage.  Pt complains of cp. Report pain to mid chest and epigastric region for nearly 2 weeks.  Seen in ER yesterday but left AMA.  Return due to having worsening chest pain and shortness of breath.  No fever, productive cough, hemoptysis.  Review of Systems  Positive: As above  Negative: As above  Physical Exam  Ht '5\' 1"'$  (1.549 m)   Wt 99.8 kg   BMI 41.57 kg/m  Gen:   Awake, no distress   Resp:  Normal effort  MSK:   Moves extremities without difficulty  Other:    Medical Decision Making  Medically screening exam initiated at 3:23 PM.  Appropriate orders placed.  CLYDEAN POSAS was informed that the remainder of the evaluation will be completed by another provider, this initial triage assessment does not replace that evaluation, and the importance of remaining in the ED until their evaluation is complete.     Domenic Moras, PA-C 08/17/21 1526

## 2021-08-17 NOTE — ED Provider Notes (Signed)
[facility] DEPT Provider Note   CSN: [id number] Arrival date & time: 08/17/21  1450     History  Chief Complaint  Patient presents with   Chest Pain    Briana Wade is a 51 y.o. female.  Patient presents with chest pain ongoing for about 10 days.  She states it is on and off but primarily present most of the time.  She was seen at [facility] yesterday had a work-up done with negative troponins negative labs.  Reportedly left AGAINST MEDICAL ADVICE.  She returns back today stating that she would like additional evaluation.  Denies any fevers or cough or vomiting or diarrhea currently the chest pain is nearly gone but she could feel "something there."  Denies fevers denies cough denies vomiting denies diarrhea.  Denies any birth control or smoking history.  Denies any leg swelling or edema.       Home Medications Prior to Admission medications   Medication Sig Start Date End Date Taking? Authorizing Provider  acetaminophen (TYLENOL) 325 MG tablet Take 2 tablets (650 mg total) by mouth every 6 (six) hours as needed. 06/14/21   Jeanell Sparrow, DO  amLODipine (NORVASC) 5 MG tablet Take 1 tablet (5 mg total) by mouth daily. 08/05/20 12/03/20  Cantwell, Celeste C, PA-C  diphenhydrAMINE (BENADRYL) 25 MG tablet Take 1 tablet (25 mg total) by mouth every 6 (six) hours as needed. 08/08/20   Charlesetta Shanks, MD  ibuprofen (ADVIL) 600 MG tablet Take 1 tablet (600 mg total) by mouth every 6 (six) hours as needed. 06/14/21   Jeanell Sparrow, DO  metoCLOPramide (REGLAN) 10 MG tablet Take 1 tablet (10 mg total) by mouth every 6 (six) hours. Patient not taking: Reported on 09/07/2020 08/08/20   Charlesetta Shanks, MD  metoprolol tartrate (LOPRESSOR) 25 MG tablet Take 25 mg by mouth 2 (two) times daily. Patient not taking: Reported on 11/28/2020 04/01/20   [provider]      Allergies    Lactose intolerance (gi)    Review of Systems   Review of Systems   Constitutional:  Negative for fever.  HENT:  Negative for ear pain.   Eyes:  Negative for pain.  Respiratory:  Positive for shortness of breath. Negative for cough.   Cardiovascular:  Positive for chest pain.  Gastrointestinal:  Negative for abdominal pain.  Genitourinary:  Negative for flank pain.  Musculoskeletal:  Negative for back pain.  Skin:  Negative for rash.  Neurological:  Negative for headaches.    Physical Exam Updated Vital Signs BP 136/90 (BP Location: Left Arm)   Pulse 80   Temp 98.3 F (36.8 C) (Oral)   Resp 15   Ht '5\' 1"'$  (1.549 m)   Wt 99.8 kg   SpO2 99%   BMI 41.57 kg/m  Physical Exam Constitutional:      General: She is not in acute distress.    Appearance: Normal appearance.  HENT:     Head: Normocephalic.     Nose: Nose normal.  Eyes:     Extraocular Movements: Extraocular movements intact.  Cardiovascular:     Rate and Rhythm: Normal rate.  Pulmonary:     Effort: Pulmonary effort is normal.  Musculoskeletal:        General: Normal range of motion.     Cervical back: Normal range of motion.     Right lower leg: No edema.     Left lower leg: No edema.  Neurological:  General: No focal deficit present.     Mental Status: She is alert. Mental status is at baseline.     ED Results / Procedures / Treatments   Labs (all labs ordered are listed, but only abnormal results are displayed) Labs Reviewed  BASIC METABOLIC PANEL  CBC  D-DIMER, QUANTITATIVE  TROPONIN I (HIGH SENSITIVITY)  TROPONIN I (HIGH SENSITIVITY)    EKG None  Radiology DG Chest 2 View  Result Date: 08/16/2021 CLINICAL DATA:  Chest pain, epigastric pain EXAM: CHEST - 2 VIEW COMPARISON:  06/13/2021 FINDINGS: The heart size and mediastinal contours are within normal limits. Both lungs are clear. The visualized skeletal structures are unremarkable. IMPRESSION: No active cardiopulmonary disease. Electronically Signed   By: Elmer Picker M.D.   On: 08/16/2021 13:49     Procedures Procedures    Medications Ordered in ED Medications - No data to display  ED Course/ Medical Decision Making/ A&P                           Medical Decision Making  Cardiac monitoring showing sinus rhythm.  Review of record shows prior visit yesterday.  EKG shows sinus rhythm prolonged QT, no ST elevations or depressions noted.  I reviewed her diagnostic studies from yesterday which were unremarkable.  I suggested with 2 additional troponins and possible D-dimer given the patient was concerned about pursuing a CT scan.  After lengthy discussion, patient declined further evaluation.  She does not want to do any blood test does not want to get a CT scan.  She states that she feels like coming back to the ER was a mistake and she wants to go home.  Patient has decision-making capacity all risk and benefits discussed.  All questions were answered.  Patient declines further medical care.  Will be leaving AGAINST MEDICAL ADVICE.        Final Clinical Impression(s) / ED Diagnoses Final diagnoses:  Nonspecific chest pain    Rx / DC Orders ED Discharge Orders     None         Luna Fuse, MD 08/17/21 [phone]

## 2021-08-17 NOTE — ED Triage Notes (Signed)
Patient c/o epigastric and chest pain that started 1 week ago. Patient states she feels like she can't catch her breath.  Patient was seen at [facility] yesterday. Patient does not want same tests run that she had yesterday. Patient refused EKG today. Patient states that she had a CXR yesterday, but left before receiving results.  Patient wants to see a provider before any additional tests are ordered.

## 2021-08-28 ENCOUNTER — Other Ambulatory Visit: Payer: Self-pay | Admitting: Physician Assistant

## 2021-08-28 DIAGNOSIS — Z1231 Encounter for screening mammogram for malignant neoplasm of breast: Secondary | ICD-10-CM

## 2021-09-04 NOTE — Progress Notes (Deleted)
Cardiology Office Note:   Date:  09/04/2021  NAME:  Briana Wade    MRN: [id number] DOB:  [birth year]   PCP:  Trey Sailors, PA  Cardiologist:  None  Electrophysiologist:  None   Referring MD: Steva Colder A, PA-C   No chief complaint on file. ***  History of Present Illness:   Briana Wade is a 51 y.o. female with a hx of HTN who is being seen today for the evaluation of chest pain at the request of Blue, Soijett A, PA-C. Seen in ER 7/22 and 7/23 for chest pain. Negative work-up.   Past Medical History: Past Medical History:  Diagnosis Date   Abdominal pain, unspecified site    Allergy    Anemia    Asthma    Chlamydia    Diverticulitis    Esophageal reflux    Hiatal hernia    Hypertension    IBS (irritable bowel syndrome)    Insomnia    Lactose intolerance    Migraine equivalent 08/2014   minor right arm weakness in [facility]. neg CVA on imaging.    Other constipation    Personal history of colonic polyps 09/12/2010   tubular adenoma   Trichimoniasis    Tubular adenoma of colon    Umbilical hernia    Uterine fibroid    Uterine leiomyoma     Past Surgical History: Past Surgical History:  Procedure Laterality Date   APPENDECTOMY     COLONOSCOPY     DILATION AND CURETTAGE OF UTERUS     x2 for EAB   HYSTEROSCOPY WITH NOVASURE N/A 12/17/2015   Procedure: HYSTEROSCOPY WITH NOVASURE;  Surgeon: Aletha Halim, MD;  Location: [facility];  Service: Gynecology;  Laterality: N/A;   TUBAL LIGATION      Current Medications: No outpatient medications have been marked as taking for the 09/05/21 encounter (Appointment) with O'Neal, Cassie Freer, MD.     Allergies:    Lactose intolerance (gi)   Social History: Social History   Socioeconomic History   Marital status: Single    Spouse name: Not on file   Number of children: 3   Years of education: Not on file   Highest education level: Not on file  Occupational History   Occupation: Unemployed  Tobacco Use    Smoking status: Never   Smokeless tobacco: Never  Vaping Use   Vaping Use: Never used  Substance and Sexual Activity   Alcohol use: No   Drug use: No   Sexual activity: Yes    Birth control/protection: Surgical  Other Topics Concern   Not on file  Social History Narrative   Not on file   Social Determinants of Health   Financial Resource Strain: Not on file  Food Insecurity: Food Insecurity Present (11/28/2020)   Hunger Vital Sign    Worried About Running Out of Food in the Last Year: Sometimes true    Ran Out of Food in the Last Year: Sometimes true  Transportation Needs: No Transportation Needs (11/28/2020)   PRAPARE - Hydrologist (Medical): No    Lack of Transportation (Non-Medical): No  Physical Activity: Not on file  Stress: Not on file  Social Connections: Not on file     Family History: The patient's ***family history includes Asthma in her cousin and son; Cancer in her mother; Hypertension in her mother. There is no history of Colon cancer, Esophageal cancer, Rectal cancer, or Stomach cancer.  ROS:   All  other ROS reviewed and negative. Pertinent positives noted in the HPI.     EKGs/Labs/Other Studies Reviewed:   The following studies were personally reviewed by me today:  EKG:  EKG is *** ordered today.  The ekg ordered today demonstrates ***, and was personally reviewed by me.   Recent Labs: 05/09/2021: ALT 17 08/16/2021: BUN 11; Creatinine, Ser 0.87; Hemoglobin 12.0; Platelets 304; Potassium 4.1; Sodium 138   Recent Lipid Panel No results found for: "CHOL", "TRIG", "HDL", "CHOLHDL", "VLDL", "LDLCALC", "LDLDIRECT"  Physical Exam:   VS:  There were no vitals taken for this visit.   Wt Readings from Last 3 Encounters:  08/17/21 220 lb (99.8 kg)  08/16/21 220 lb (99.8 kg)  06/14/21 220 lb (99.8 kg)    General: Well nourished, well developed, in no acute distress Head: Atraumatic, normal size  Eyes: PEERLA, EOMI  Neck:  Supple, no JVD Endocrine: No thryomegaly Cardiac: Normal S1, S2; RRR; no murmurs, rubs, or gallops Lungs: Clear to auscultation bilaterally, no wheezing, rhonchi or rales  Abd: Soft, nontender, no hepatomegaly  Ext: No edema, pulses 2+ Musculoskeletal: No deformities, BUE and BLE strength normal and equal Skin: Warm and dry, no rashes   Neuro: Alert and oriented to person, place, time, and situation, CNII-XII grossly intact, no focal deficits  Psych: Normal mood and affect   ASSESSMENT:   Briana Wade is a 51 y.o. female who presents for the following: No diagnosis found.  PLAN:   There are no diagnoses linked to this encounter.  {Are you ordering a CV Procedure (e.g. stress test, cath, DCCV, TEE, etc)?   Press F2        :[id number]}  Disposition: No follow-ups on file.  Medication Adjustments/Labs and Tests Ordered: Current medicines are reviewed at length with the patient today.  Concerns regarding medicines are outlined above.  No orders of the defined types were placed in this encounter.  No orders of the defined types were placed in this encounter.   There are no Patient Instructions on file for this visit.   Time Spent with Patient: I have spent a total of *** minutes with patient reviewing hospital notes, telemetry, EKGs, labs and examining the patient as well as establishing an assessment and plan that was discussed with the patient.  > 50% of time was spent in direct patient care.  Signed, Addison Naegeli. Audie Box, MD, [facility]  [address] [place],  [id number] [phone]  09/04/2021 7:29 PM

## 2021-09-05 ENCOUNTER — Ambulatory Visit: Payer: 59 | Admitting: Cardiovascular Disease

## 2021-09-05 DIAGNOSIS — R072 Precordial pain: Secondary | ICD-10-CM

## 2021-09-28 ENCOUNTER — Encounter (HOSPITAL_COMMUNITY): Payer: Self-pay | Admitting: Emergency Medicine

## 2021-09-28 ENCOUNTER — Emergency Department (HOSPITAL_COMMUNITY): Payer: Commercial Managed Care - HMO

## 2021-09-28 ENCOUNTER — Emergency Department (HOSPITAL_COMMUNITY)
Admission: EM | Admit: 2021-09-28 | Discharge: 2021-09-28 | Disposition: A | Discharge status: Home / Self Care | Payer: Commercial Managed Care - HMO | Attending: Emergency Medicine | Admitting: Emergency Medicine

## 2021-09-28 ENCOUNTER — Other Ambulatory Visit: Payer: Self-pay

## 2021-09-28 DIAGNOSIS — K5732 Diverticulitis of large intestine without perforation or abscess without bleeding: Secondary | ICD-10-CM | POA: Insufficient documentation

## 2021-09-28 DIAGNOSIS — I10 Essential (primary) hypertension: Secondary | ICD-10-CM | POA: Diagnosis not present

## 2021-09-28 DIAGNOSIS — N9489 Other specified conditions associated with female genital organs and menstrual cycle: Secondary | ICD-10-CM | POA: Diagnosis not present

## 2021-09-28 DIAGNOSIS — Z79899 Other long term (current) drug therapy: Secondary | ICD-10-CM | POA: Insufficient documentation

## 2021-09-28 DIAGNOSIS — R109 Unspecified abdominal pain: Secondary | ICD-10-CM | POA: Diagnosis present

## 2021-09-28 DIAGNOSIS — J45909 Unspecified asthma, uncomplicated: Secondary | ICD-10-CM | POA: Diagnosis not present

## 2021-09-28 LAB — COMPREHENSIVE METABOLIC PANEL
ALT: 21 U/L (ref 0–44)
AST: 29 U/L (ref 15–41)
Albumin: 3.8 g/dL (ref 3.5–5.0)
Alkaline Phosphatase: 76 U/L (ref 38–126)
Anion gap: 6 (ref 5–15)
BUN: 11 mg/dL (ref 6–20)
CO2: 27 mmol/L (ref 22–32)
Calcium: 9 mg/dL (ref 8.9–10.3)
Chloride: 107 mmol/L (ref 98–111)
Creatinine, Ser: 0.91 mg/dL (ref 0.44–1.00)
GFR, Estimated: >60 mL/min (ref >60)
Glucose, Bld: 121 mg/dL — ABNORMAL HIGH (ref 70–99)
Potassium: 4.2 mmol/L (ref 3.5–5.1)
Sodium: 140 mmol/L (ref 135–145)
Total Bilirubin: 0.5 mg/dL (ref 0.3–1.2)
Total Protein: 7.3 g/dL (ref 6.5–8.1)

## 2021-09-28 LAB — CBC WITH DIFFERENTIAL/PLATELET
Abs Immature Granulocytes: 0.02 10*3/uL (ref 0.00–0.07)
Basophils Absolute: 0 10*3/uL (ref 0.0–0.1)
Basophils Relative: 0 %
Eosinophils Absolute: 0.2 10*3/uL (ref 0.0–0.5)
Eosinophils Relative: 3 %
HCT: 33.8 % — ABNORMAL LOW (ref 36.0–46.0)
Hemoglobin: 10.6 g/dL — ABNORMAL LOW (ref 12.0–15.0)
Immature Granulocytes: 0 %
Lymphocytes Relative: 20 %
Lymphs Abs: 1.5 10*3/uL (ref 0.7–4.0)
MCH: 26.8 pg (ref 26.0–34.0)
MCHC: 31.4 g/dL (ref 30.0–36.0)
MCV: 85.6 fL (ref 80.0–100.0)
Monocytes Absolute: 0.8 10*3/uL (ref 0.1–1.0)
Monocytes Relative: 11 %
Neutro Abs: 5 10*3/uL (ref 1.7–7.7)
Neutrophils Relative %: 66 %
Platelets: 300 10*3/uL (ref 150–400)
RBC: 3.95 MIL/uL (ref 3.87–5.11)
RDW: 14.9 % (ref 11.5–15.5)
WBC: 7.6 10*3/uL (ref 4.0–10.5)
nRBC: 0 % (ref 0.0–0.2)

## 2021-09-28 LAB — URINALYSIS, ROUTINE W REFLEX MICROSCOPIC
Bacteria, UA: NONE SEEN
Bilirubin Urine: NEGATIVE
Glucose, UA: NEGATIVE mg/dL
Hgb urine dipstick: NEGATIVE
Ketones, ur: NEGATIVE mg/dL
Leukocytes,Ua: NEGATIVE
Nitrite: NEGATIVE
Protein, ur: NEGATIVE mg/dL
Specific Gravity, Urine: 1.014 (ref 1.005–1.030)
pH: 7 (ref 5.0–8.0)

## 2021-09-28 LAB — LIPASE, BLOOD: Lipase: 28 U/L (ref 11–51)

## 2021-09-28 LAB — I-STAT BETA HCG BLOOD, ED (MC, WL, AP ONLY): I-stat hCG, quantitative: <5 m[IU]/mL (ref <5)

## 2021-09-28 MED ORDER — MORPHINE SULFATE (PF) 4 MG/ML IV SOLN
4.0000 mg | Freq: Once | INTRAVENOUS | Status: AC
  Administered 2021-09-28: 4 mg via INTRAVENOUS
  Filled 2021-09-28: qty 1

## 2021-09-28 MED ORDER — ONDANSETRON HCL 4 MG PO TABS: 4.0000 mg | ORAL_TABLET | Freq: Four times a day (QID) | ORAL | 0 refills | Status: DC

## 2021-09-28 MED ORDER — ONDANSETRON HCL 4 MG/2ML IJ SOLN
4.0000 mg | Freq: Once | INTRAMUSCULAR | Status: AC
  Administered 2021-09-28: 4 mg via INTRAVENOUS
  Filled 2021-09-28: qty 2

## 2021-09-28 MED ORDER — SODIUM CHLORIDE 0.9 % IV BOLUS
1000.0000 mL | Freq: Once | INTRAVENOUS | Status: AC
  Administered 2021-09-28: 1000 mL via INTRAVENOUS

## 2021-09-28 MED ORDER — DOCUSATE SODIUM 100 MG PO CAPS: 100.0000 mg | ORAL_CAPSULE | Freq: Every day | ORAL | 0 refills | Status: DC | PRN

## 2021-09-28 MED ORDER — SODIUM CHLORIDE (PF) 0.9 % IJ SOLN
INTRAMUSCULAR | Status: AC
  Filled 2021-09-28: qty 50

## 2021-09-28 MED ORDER — OXYCODONE-ACETAMINOPHEN 5-325 MG PO TABS: 1.0000 | ORAL_TABLET | Freq: Four times a day (QID) | ORAL | 0 refills | Status: DC | PRN

## 2021-09-28 MED ORDER — AMOXICILLIN-POT CLAVULANATE 875-125 MG PO TABS: 1.0000 | ORAL_TABLET | Freq: Two times a day (BID) | ORAL | 0 refills | Status: DC

## 2021-09-28 MED ORDER — IOHEXOL 300 MG/ML  SOLN
100.0000 mL | Freq: Once | INTRAMUSCULAR | Status: AC | PRN
  Administered 2021-09-28: 100 mL via INTRAVENOUS

## 2021-09-28 MED ORDER — AMOXICILLIN-POT CLAVULANATE 875-125 MG PO TABS
1.0000 | ORAL_TABLET | Freq: Once | ORAL | Status: AC
  Administered 2021-09-28: 1 via ORAL
  Filled 2021-09-28: qty 1

## 2021-09-28 NOTE — ED Triage Notes (Signed)
Pt reports left sided flank pain. Pt reports it feels like her diverticulitis. Denies N/V/D.

## 2021-09-28 NOTE — Discharge Instructions (Addendum)
Your CT scan shows diverticulitis.  Please take antibiotics twice daily with food.  If you have worsening pain you can use prescribed pain medication, use this with caution as it can cause drowsiness.  Use Zofran as needed for nausea or vomiting.  Take Colace daily if you are not having regular bowel movements or having to strain to have bowel movements.  Follow-up with your GI doctor.

## 2021-09-28 NOTE — ED Provider Notes (Signed)
[facility] DEPT Provider Note   CSN: [id number] Arrival date & time: 09/28/21  0737     History  Chief Complaint  Patient presents with   Abdominal Pain    Briana Wade is a 51 y.o. female.  Briana Wade is a 51 y.o. female with a history of IBS, multiple episodes of prior diverticulitis, asthma, anemia, migraines, hypertension, who presents to the ED for evaluation of left-sided abdominal and flank pain.  Pain started about 2 days ago.  Feels like her prior episodes of diverticulitis.  No associated nausea, vomiting or diarrhea.  No fevers or chills.  She has required hospitalization once before for her diverticulitis.  Has not tried anything to treat pain prior to arrival.  Reports previous appendectomy and tubal ligation, no prior abdominal surgeries.  The history is provided by the patient and the spouse.  Flank Pain Associated symptoms include abdominal pain. Pertinent negatives include no chest pain and no shortness of breath.       Home Medications Prior to Admission medications   Medication Sig Start Date End Date Taking? Authorizing Provider  amoxicillin-clavulanate (AUGMENTIN) 875-125 MG tablet Take 1 tablet by mouth every 12 (twelve) hours. 09/28/21  Yes Jacqlyn Larsen, PA-C  docusate sodium (COLACE) 100 MG capsule Take 1 capsule (100 mg total) by mouth daily as needed for mild constipation. 09/28/21  Yes Jacqlyn Larsen, PA-C  ondansetron (ZOFRAN) 4 MG tablet Take 1 tablet (4 mg total) by mouth every 6 (six) hours. 09/28/21  Yes Jacqlyn Larsen, PA-C  oxyCODONE-acetaminophen (PERCOCET) 5-325 MG tablet Take 1 tablet by mouth every 6 (six) hours as needed. 09/28/21  Yes Jacqlyn Larsen, PA-C  acetaminophen (TYLENOL) 325 MG tablet Take 2 tablets (650 mg total) by mouth every 6 (six) hours as needed. 06/14/21   Jeanell Sparrow, DO  amLODipine (NORVASC) 5 MG tablet Take 1 tablet (5 mg total) by mouth daily. 08/05/20 12/03/20  Cantwell, Celeste C, PA-C   diphenhydrAMINE (BENADRYL) 25 MG tablet Take 1 tablet (25 mg total) by mouth every 6 (six) hours as needed. 08/08/20   Charlesetta Shanks, MD  ibuprofen (ADVIL) 600 MG tablet Take 1 tablet (600 mg total) by mouth every 6 (six) hours as needed. 06/14/21   Jeanell Sparrow, DO  metoCLOPramide (REGLAN) 10 MG tablet Take 1 tablet (10 mg total) by mouth every 6 (six) hours. Patient not taking: Reported on 09/07/2020 08/08/20   Charlesetta Shanks, MD  metoprolol tartrate (LOPRESSOR) 25 MG tablet Take 25 mg by mouth 2 (two) times daily. Patient not taking: Reported on 11/28/2020 04/01/20   [provider]      Allergies    Lactose intolerance (gi)    Review of Systems   Review of Systems  Constitutional:  Negative for chills and fever.  Respiratory:  Negative for cough and shortness of breath.   Cardiovascular:  Negative for chest pain.  Gastrointestinal:  Positive for abdominal pain. Negative for diarrhea, nausea and vomiting.  Genitourinary:  Positive for flank pain. Negative for dysuria, hematuria, vaginal bleeding and vaginal discharge.  Musculoskeletal:  Negative for arthralgias and myalgias.  Skin:  Negative for color change and rash.  Psychiatric/Behavioral:  The patient is not hyperactive.   All other systems reviewed and are negative.   Physical Exam Updated Vital Signs BP (!) 151/93 (BP Location: Left Arm)   Pulse 87   Temp 98.5 F (36.9 C) (Oral)   Resp 15   SpO2 98%  Physical Exam Vitals and nursing note reviewed.  Constitutional:      General: She is not in acute distress.    Appearance: Normal appearance. She is well-developed. She is not diaphoretic.  HENT:     Head: Normocephalic and atraumatic.  Eyes:     General:        Right eye: No discharge.        Left eye: No discharge.  Cardiovascular:     Rate and Rhythm: Normal rate and regular rhythm.     Pulses: Normal pulses.     Heart sounds: Normal heart sounds.  Pulmonary:     Effort: Pulmonary effort is normal. No  respiratory distress.     Breath sounds: Normal breath sounds. No wheezing or rales.     Comments: Respirations equal and unlabored, patient able to speak in full sentences, lungs clear to auscultation bilaterally  Abdominal:     General: Bowel sounds are normal. There is no distension.     Palpations: Abdomen is soft. There is no mass.     Tenderness: There is abdominal tenderness in the left lower quadrant. There is left CVA tenderness. There is no guarding.     Comments: Tenderness over the left lower quadrant and left flank, all other quadrants nontender to palpation, no guarding or peritoneal signs  Musculoskeletal:        General: No deformity.     Cervical back: Neck supple.  Skin:    General: Skin is warm and dry.     Capillary Refill: Capillary refill takes less than 2 seconds.  Neurological:     Mental Status: She is alert and oriented to person, place, and time.     Coordination: Coordination normal.     Comments: Speech is clear, able to follow commands Moves extremities without ataxia, coordination intact  Psychiatric:        Mood and Affect: Mood normal.        Behavior: Behavior normal.     ED Results / Procedures / Treatments   Labs (all labs ordered are listed, but only abnormal results are displayed) Labs Reviewed  COMPREHENSIVE METABOLIC PANEL - Abnormal; Notable for the following components:      Result Value   Glucose, Bld 121 (*)    All other components within normal limits  CBC WITH DIFFERENTIAL/PLATELET - Abnormal; Notable for the following components:   Hemoglobin 10.6 (*)    HCT 33.8 (*)    All other components within normal limits  URINALYSIS, ROUTINE W REFLEX MICROSCOPIC  LIPASE, BLOOD  I-STAT BETA HCG BLOOD, ED ([facility], [facility], [facility] ONLY)    EKG None  Radiology CT Abdomen Pelvis W Contrast  Result Date: 09/28/2021 CLINICAL DATA:  Left lower quadrant abdominal pain. EXAM: CT ABDOMEN AND PELVIS WITH CONTRAST TECHNIQUE: Multidetector CT imaging of the  abdomen and pelvis was performed using the standard protocol following bolus administration of intravenous contrast. RADIATION DOSE REDUCTION: This exam was performed according to the departmental dose-optimization program which includes automated exposure control, adjustment of the mA and/or kV according to patient size and/or use of iterative reconstruction technique. CONTRAST:  161m OMNIPAQUE IOHEXOL 300 MG/ML  SOLN COMPARISON:  CTA abdomen in pelvis 09/13/2020. FINDINGS: Lower chest: Unremarkable. Hepatobiliary: No suspicious focal abnormality within the liver parenchyma. There is no evidence for gallstones, gallbladder wall thickening, or pericholecystic fluid. No intrahepatic or extrahepatic biliary dilation. Pancreas: No focal mass lesion. No dilatation of the main duct. No intraparenchymal cyst. No peripancreatic edema. Spleen: No  splenomegaly. No focal mass lesion. Adrenals/Urinary Tract: No adrenal nodule or mass. Kidneys unremarkable. No evidence for hydroureter. The urinary bladder appears normal for the degree of distention. Stomach/Bowel: Small hiatal hernia. Stomach otherwise unremarkable. Duodenum is normally positioned as is the ligament of Treitz. No small bowel wall thickening. No small bowel dilatation. The terminal ileum is normal. The appendix is not well visualized, but there is no edema or inflammation in the region of the cecum. Diverticular changes are seen diffusely in the colon. There is pericolonic edema/inflammation associated with an enlarged ill-defined diverticulum in the distal descending colon (axial image 50/series 2 and coronal image 85/series 4). Imaging features compatible with diverticulitis. No perforation or abscess. Vascular/Lymphatic: No abdominal aortic aneurysm. No abdominal aortic atherosclerotic calcification. There is no gastrohepatic or hepatoduodenal ligament lymphadenopathy. No retroperitoneal or mesenteric lymphadenopathy. No pelvic sidewall lymphadenopathy.  Reproductive: Endometrium measures 18 mm thickness on sagittal imaging it appears heterogeneous. There is no adnexal mass. Other: Trace free fluid is seen in the cul-de-sac. Musculoskeletal: No worrisome lytic or sclerotic osseous abnormality. IMPRESSION: 1. Findings compatible with acute diverticulitis in the distal descending colon. No perforation or abscess. 2. 18 mm endometrial thickness on sagittal imaging with heterogeneous appearance. This would be abnormal in a postmenopausal female. Correlation for abnormal vaginal bleeding recommended. Consider follow-up ultrasound to further evaluate. 3. Small hiatal hernia Electronically Signed   By: Misty Stanley M.D.   On: 09/28/2021 11:41     Procedures Procedures    Medications Ordered in ED Medications  sodium chloride 0.9 % bolus 1,000 mL (0 mLs Intravenous Stopped 09/28/21 1200)  ondansetron (ZOFRAN) injection 4 mg (4 mg Intravenous Given 09/28/21 0922)  morphine (PF) 4 MG/ML injection 4 mg (4 mg Intravenous Given 09/28/21 0922)  iohexol (OMNIPAQUE) 300 MG/ML solution 100 mL (100 mLs Intravenous Contrast Given 09/28/21 1055)  amoxicillin-clavulanate (AUGMENTIN) 875-125 MG per tablet 1 tablet (1 tablet Oral Given 09/28/21 1231)    ED Course/ Medical Decision Making/ A&P                           Medical Decision Making Amount and/or Complexity of Data Reviewed Labs: ordered. Radiology: ordered.  Risk OTC drugs. Prescription drug management.   Patient presents to the ED with complaints of abdominal pain. Patient nontoxic appearing, in no apparent distress, vitals WNL . On exam patient tender to palpation in left lower quadrant and over left flank, no peritoneal signs. Will evaluate with labs and CT abdomen pelvis.   Ddx including but not limited to: Diverticulitis, colitis, perforation, obstruction, gastroenteritis, pancreatitis, pyelonephritis, kidney stone  Additional history obtained:  Additional history obtained from chart review &  nursing note review.   Lab Tests:  I Ordered, viewed, and interpreted labs, which included:  CBC: No significant leukocytosis, stable hemoglobin CMP: No significant electrolyte derangements, normal renal and liver function Lipase: WNL UA: No signs of infection, no hematuria Preg test: Negative  Imaging Studies ordered:  I ordered imaging studies which included CT abdomen pelvis, I independently reviewed, formal radiology impression shows:  Diverticulitis of the distal descending colon without evidence of perforation or obstruction.  Also noted to have thickened endometrial lining, will have patient follow-up with OB/GYN regarding this  ED Course:  I ordered morphine, Zofran, IV fluid bolus, Augmentin   RE-EVAL: Pain is resolved and patient is tolerating p.o. fluids and able to tolerate oral antibiotics  Discussed CT results, consistent with diverticulitis and plan for treatment as an  outpatient.  Provided strict return precautions and encouraged to follow-up with PCP and GI doctor.  Also discussed incidental findings of thickened endometrial lining and patient will follow-up with her gynecologist regarding this.  Prescribed course of antibiotics, stool softeners, pain and nausea medication.  Discharged home in good condition.  Portions of this note were generated with [employer]. Dictation errors may occur despite best attempts at proofreading.           Final Clinical Impression(s) / ED Diagnoses Final diagnoses:  Diverticulitis of colon    Rx / DC Orders ED Discharge Orders          Ordered    amoxicillin-clavulanate (AUGMENTIN) 875-125 MG tablet  Every 12 hours        09/28/21 1258    oxyCODONE-acetaminophen (PERCOCET) 5-325 MG tablet  Every 6 hours PRN        09/28/21 1258    ondansetron (ZOFRAN) 4 MG tablet  Every 6 hours        09/28/21 1258    docusate sodium (COLACE) 100 MG capsule  Daily PRN        09/28/21 1258              Jacqlyn Larsen, PA-C 10/14/21 1452    Charlesetta Shanks, MD 10/18/21 2027

## 2021-11-04 ENCOUNTER — Encounter (HOSPITAL_COMMUNITY): Payer: Self-pay | Admitting: Emergency Medicine

## 2021-11-04 ENCOUNTER — Other Ambulatory Visit: Payer: Self-pay

## 2021-11-04 ENCOUNTER — Emergency Department (HOSPITAL_COMMUNITY): Payer: Commercial Managed Care - HMO

## 2021-11-04 ENCOUNTER — Emergency Department (HOSPITAL_COMMUNITY)
Admission: EM | Admit: 2021-11-04 | Discharge: 2021-11-05 | Disposition: A | Discharge status: Home / Self Care | Payer: Commercial Managed Care - HMO | Attending: Emergency Medicine | Admitting: Emergency Medicine

## 2021-11-04 DIAGNOSIS — J4521 Mild intermittent asthma with (acute) exacerbation: Secondary | ICD-10-CM | POA: Diagnosis not present

## 2021-11-04 DIAGNOSIS — I10 Essential (primary) hypertension: Secondary | ICD-10-CM | POA: Diagnosis not present

## 2021-11-04 DIAGNOSIS — R0789 Other chest pain: Secondary | ICD-10-CM

## 2021-11-04 DIAGNOSIS — Z8542 Personal history of malignant neoplasm of other parts of uterus: Secondary | ICD-10-CM | POA: Insufficient documentation

## 2021-11-04 LAB — CBC WITH DIFFERENTIAL/PLATELET
Abs Immature Granulocytes: 0.02 10*3/uL (ref 0.00–0.07)
Basophils Absolute: 0 10*3/uL (ref 0.0–0.1)
Basophils Relative: 0 %
Eosinophils Absolute: 0.2 10*3/uL (ref 0.0–0.5)
Eosinophils Relative: 2 %
HCT: 37.3 % (ref 36.0–46.0)
Hemoglobin: 11.5 g/dL — ABNORMAL LOW (ref 12.0–15.0)
Immature Granulocytes: 0 %
Lymphocytes Relative: 32 %
Lymphs Abs: 2.4 10*3/uL (ref 0.7–4.0)
MCH: 25.8 pg — ABNORMAL LOW (ref 26.0–34.0)
MCHC: 30.8 g/dL (ref 30.0–36.0)
MCV: 83.6 fL (ref 80.0–100.0)
Monocytes Absolute: 0.4 10*3/uL (ref 0.1–1.0)
Monocytes Relative: 6 %
Neutro Abs: 4.5 10*3/uL (ref 1.7–7.7)
Neutrophils Relative %: 60 %
Platelets: 330 10*3/uL (ref 150–400)
RBC: 4.46 MIL/uL (ref 3.87–5.11)
RDW: 14.5 % (ref 11.5–15.5)
WBC: 7.6 10*3/uL (ref 4.0–10.5)
nRBC: 0 % (ref 0.0–0.2)

## 2021-11-04 LAB — COMPREHENSIVE METABOLIC PANEL
ALT: 15 U/L (ref 0–44)
AST: 16 U/L (ref 15–41)
Albumin: 3.8 g/dL (ref 3.5–5.0)
Alkaline Phosphatase: 89 U/L (ref 38–126)
Anion gap: 6 (ref 5–15)
BUN: 9 mg/dL (ref 6–20)
CO2: 27 mmol/L (ref 22–32)
Calcium: 8.9 mg/dL (ref 8.9–10.3)
Chloride: 104 mmol/L (ref 98–111)
Creatinine, Ser: 0.78 mg/dL (ref 0.44–1.00)
GFR, Estimated: >60 mL/min (ref >60)
Glucose, Bld: 106 mg/dL — ABNORMAL HIGH (ref 70–99)
Potassium: 3.6 mmol/L (ref 3.5–5.1)
Sodium: 137 mmol/L (ref 135–145)
Total Bilirubin: 0.4 mg/dL (ref 0.3–1.2)
Total Protein: 7.3 g/dL (ref 6.5–8.1)

## 2021-11-04 LAB — TROPONIN I (HIGH SENSITIVITY): Troponin I (High Sensitivity): 2 ng/L (ref <18)

## 2021-11-04 MED ORDER — IPRATROPIUM-ALBUTEROL 0.5-2.5 (3) MG/3ML IN SOLN
3.0000 mL | Freq: Once | RESPIRATORY_TRACT | Status: AC
  Administered 2021-11-05: 3 mL via RESPIRATORY_TRACT
  Filled 2021-11-04: qty 3

## 2021-11-04 MED ORDER — KETOROLAC TROMETHAMINE 15 MG/ML IJ SOLN
15.0000 mg | Freq: Once | INTRAMUSCULAR | Status: DC
  Filled 2021-11-04: qty 1

## 2021-11-04 MED ORDER — DEXAMETHASONE SODIUM PHOSPHATE 10 MG/ML IJ SOLN
10.0000 mg | Freq: Once | INTRAMUSCULAR | Status: AC
  Administered 2021-11-05: 10 mg via INTRAVENOUS
  Filled 2021-11-04: qty 1

## 2021-11-04 NOTE — ED Provider Triage Note (Addendum)
Emergency Medicine Provider Triage Evaluation Note  Briana Wade , a 51 y.o. female  was evaluated in triage.  Pt complains of chest pain.  Patient states that symptoms began approximately 2 hours ago when she was sitting on her couch.  She reports constant pain described as tightness in the area since onset.  Denies radiation.  Denies feelings of associated shortness of breath, nausea, abdominal pain, dizziness/lightheadedness.  Reports no history of cardiac or pulmonary problems.  Denies any known exacerbating or relieving factors..  Review of Systems  Positive: Above Negative:   Physical Exam  There were no vitals taken for this visit. Gen:   Awake, no distress   Resp:  Normal effort  MSK:   Moves extremities without difficulty  Other:  No obvious murmurs gallops or rubs.  Lungs clear to auscultation bilaterally.  Pain reproducible with palpation of right anterior chest wall  Medical Decision Making  Medically screening exam initiated at 10:35 PM.  Appropriate orders placed.  Briana Wade was informed that the remainder of the evaluation will be completed by another provider, this initial triage assessment does not replace that evaluation, and the importance of remaining in the ED until their evaluation is complete.     Wilnette Kales, [place] 11/04/21 2236    Wilnette Kales, [place] 11/04/21 2236

## 2021-11-04 NOTE — ED Triage Notes (Signed)
Pt reports right sided chest pain x a couple of hours while at rest; chest painful to touch; pt further reports cough x months that she has been seen for with no improvement

## 2021-11-05 NOTE — ED Provider Notes (Signed)
[facility] DEPT Provider Note   CSN: [id number] Arrival date & time: 11/04/21  2218     History  Chief Complaint  Patient presents with   Chest Pain    Briana Wade is a 51 y.o. female.  HPI     This is a 51 year old female who presents with chest discomfort.  Patient reports right-sided chest discomfort.  Onset several hours ago while sitting.  She has had a nonproductive cough.  No fevers.  She describes the pain as tightness.  Not worse with ambulation or deep breathing.  Reports history of high blood pressure.  Also has a history of asthma.  Home Medications Prior to Admission medications   Medication Sig Start Date End Date Taking? Authorizing Provider  acetaminophen (TYLENOL) 325 MG tablet Take 2 tablets (650 mg total) by mouth every 6 (six) hours as needed. 06/14/21   Jeanell Sparrow, DO  amLODipine (NORVASC) 5 MG tablet Take 1 tablet (5 mg total) by mouth daily. 08/05/20 12/03/20  Cantwell, Celeste C, PA-C  amoxicillin-clavulanate (AUGMENTIN) 875-125 MG tablet Take 1 tablet by mouth every 12 (twelve) hours. 09/28/21   Jacqlyn Larsen, PA-C  diphenhydrAMINE (BENADRYL) 25 MG tablet Take 1 tablet (25 mg total) by mouth every 6 (six) hours as needed. 08/08/20   Charlesetta Shanks, MD  docusate sodium (COLACE) 100 MG capsule Take 1 capsule (100 mg total) by mouth daily as needed for mild constipation. 09/28/21   Jacqlyn Larsen, PA-C  ibuprofen (ADVIL) 600 MG tablet Take 1 tablet (600 mg total) by mouth every 6 (six) hours as needed. 06/14/21   Jeanell Sparrow, DO  metoCLOPramide (REGLAN) 10 MG tablet Take 1 tablet (10 mg total) by mouth every 6 (six) hours. Patient not taking: Reported on 09/07/2020 08/08/20   Charlesetta Shanks, MD  metoprolol tartrate (LOPRESSOR) 25 MG tablet Take 25 mg by mouth 2 (two) times daily. Patient not taking: Reported on 11/28/2020 04/01/20   [provider]  ondansetron (ZOFRAN) 4 MG tablet Take 1 tablet (4 mg total) by mouth  every 6 (six) hours. 09/28/21   Jacqlyn Larsen, PA-C  oxyCODONE-acetaminophen (PERCOCET) 5-325 MG tablet Take 1 tablet by mouth every 6 (six) hours as needed. 09/28/21   Jacqlyn Larsen, PA-C      Allergies    Lactose intolerance (gi)    Review of Systems   Review of Systems  Respiratory:  Positive for cough and chest tightness. Negative for shortness of breath.   Cardiovascular:  Negative for leg swelling.  All other systems reviewed and are negative.   Physical Exam Updated Vital Signs BP (!) 185/123 (BP Location: Right Arm)   Pulse 81   Temp 98.5 F (36.9 C) (Oral)   Resp 18   Ht 1.549 m ('5\' 1"'$ )   Wt 99.8 kg   SpO2 99%   BMI 41.57 kg/m  Physical Exam Vitals and nursing note reviewed.  Constitutional:      Appearance: She is well-developed. She is obese. She is not ill-appearing.  HENT:     Head: Normocephalic and atraumatic.  Eyes:     Pupils: Pupils are equal, round, and reactive to light.  Cardiovascular:     Rate and Rhythm: Normal rate and regular rhythm.     Heart sounds: Normal heart sounds.  Pulmonary:     Effort: Pulmonary effort is normal. No respiratory distress.     Breath sounds: Wheezing present.     Comments: Fair air movement in  all lung fields, wheezing noted Chest:     Chest wall: No tenderness.  Abdominal:     General: Bowel sounds are normal.     Palpations: Abdomen is soft.  Musculoskeletal:     Cervical back: Neck supple.     Right lower leg: No tenderness. No edema.     Left lower leg: No tenderness. No edema.  Skin:    General: Skin is warm and dry.  Neurological:     Mental Status: She is alert and oriented to person, place, and time.  Psychiatric:        Mood and Affect: Mood normal.     ED Results / Procedures / Treatments   Labs (all labs ordered are listed, but only abnormal results are displayed) Labs Reviewed  COMPREHENSIVE METABOLIC PANEL - Abnormal; Notable for the following components:      Result Value   Glucose, Bld 106  (*)    All other components within normal limits  CBC WITH DIFFERENTIAL/PLATELET - Abnormal; Notable for the following components:   Hemoglobin 11.5 (*)    MCH 25.8 (*)    All other components within normal limits  TROPONIN I (HIGH SENSITIVITY)  TROPONIN I (HIGH SENSITIVITY)    EKG EKG Interpretation  Date/Time:  Tuesday November 04 2021 22:39:09 EDT Ventricular Rate:  80 PR Interval:  160 QRS Duration: 77 QT Interval:  422 QTC Calculation: 487 R Axis:   -11 Text Interpretation: Sinus rhythm Abnormal R-wave progression, early transition Left ventricular hypertrophy Borderline prolonged QT interval Confirmed by Thayer Jew [phone]) on 11/04/2021 11:36:17 PM  Radiology DG Chest Port 1 View  Result Date: 11/04/2021 CLINICAL DATA:  Chest pain EXAM: PORTABLE CHEST 1 VIEW COMPARISON:  None Available. FINDINGS: The heart size and mediastinal contours are within normal limits. Both lungs are clear. The visualized skeletal structures are unremarkable. IMPRESSION: No active disease. Electronically Signed   By: Fidela Salisbury M.D.   On: 11/04/2021 22:51    Procedures Procedures    Medications Ordered in ED Medications  ketorolac (TORADOL) 15 MG/ML injection 15 mg (15 mg Intravenous Not Given 11/05/21 0023)  dexamethasone (DECADRON) injection 10 mg (10 mg Intravenous Given 11/05/21 0024)  ipratropium-albuterol (DUONEB) 0.5-2.5 (3) MG/3ML nebulizer solution 3 mL (3 mLs Nebulization Given 11/05/21 0008)    ED Course/ Medical Decision Making/ A&P                           Medical Decision Making Risk Prescription drug management.   This patient presents to the ED for concern of chest tightness, this involves an extensive number of treatment options, and is a complaint that carries with it a high risk of complications and morbidity.  I considered the following differential and admission for this acute, potentially life threatening condition.  The differential diagnosis includes ACS,  PE, pneumonia, pneumothorax, asthma exacerbation  MDM:    This is a 51 year old female who presents with chest tightness.  She is nontoxic.  Vital signs are notable for blood pressure 185/123.  She is wheezing on exam.  She was given a dose of Decadron and a DuoNeb.  EKG shows no evidence of acute arrhythmia or ischemia.  Chest x-ray shows no evidence of pneumothorax or pneumonia.  Troponin x1 is negative.  Highly doubt PE and with the exception of age she would be PERC.  Nursing reported to me that patient declined further testing.  She did not want her repeat troponin.  Heart score is 2.  I have low suspicion for ACS at this time especially given acute wheezing.  We will treat as an asthma exacerbation.  (Labs, imaging, consults)  Labs: I Ordered, and personally interpreted labs.  The pertinent results include:  CBC, BMP, troponin  Imaging Studies ordered: I ordered imaging studies including chest x-ray I independently visualized and interpreted imaging. I agree with the radiologist interpretation  Additional history obtained from family at bedside.  External records from outside source obtained and reviewed including prior evaluations  Cardiac Monitoring: The patient was maintained on a cardiac monitor.  I personally viewed and interpreted the cardiac monitored which showed an underlying rhythm of: Normal sinus rhythm  Reevaluation: After the interventions noted above, I reevaluated the patient and found that they have :improved  Social Determinants of Health: Lives independently  Disposition: Discharge  Co morbidities that complicate the patient evaluation  Past Medical History:  Diagnosis Date   Abdominal pain, unspecified site    Allergy    Anemia    Asthma    Chlamydia    Diverticulitis    Esophageal reflux    Hiatal hernia    Hypertension    IBS (irritable bowel syndrome)    Insomnia    Lactose intolerance    Migraine equivalent 08/2014   minor right arm weakness  in [facility]. neg CVA on imaging.    Other constipation    Personal history of colonic polyps 09/12/2010   tubular adenoma   Trichimoniasis    Tubular adenoma of colon    Umbilical hernia    Uterine fibroid    Uterine leiomyoma      Medicines Meds ordered this encounter  Medications   dexamethasone (DECADRON) injection 10 mg   ketorolac (TORADOL) 15 MG/ML injection 15 mg   ipratropium-albuterol (DUONEB) 0.5-2.5 (3) MG/3ML nebulizer solution 3 mL    I have reviewed the patients home medicines and have made adjustments as needed  Problem List / ED Course: Problem List Items Addressed This Visit       Respiratory   Asthma   Other Visit Diagnoses     Atypical chest pain    -  Primary                   Final Clinical Impression(s) / ED Diagnoses Final diagnoses:  Atypical chest pain  Mild intermittent asthma with acute exacerbation    Rx / DC Orders ED Discharge Orders     None         Merryl Hacker, MD 11/05/21 [phone]

## 2021-11-05 NOTE — Discharge Instructions (Signed)
Seen today for chest pain.  Your chest pain work-up is reassuring.  You were wheezing on exam and may have a mild asthma exacerbation.  Use your inhaler.

## 2021-12-15 LAB — HM COLONOSCOPY

## 2022-02-02 ENCOUNTER — Ambulatory Visit: Payer: Commercial Managed Care - HMO | Admitting: Obstetrics and Gynecology

## 2022-02-10 ENCOUNTER — Encounter: Payer: Self-pay | Admitting: Obstetrics and Gynecology

## 2022-02-10 ENCOUNTER — Ambulatory Visit: Payer: Commercial Managed Care - HMO | Admitting: Obstetrics and Gynecology

## 2022-02-11 NOTE — Progress Notes (Signed)
Patient did not keep her GYN appointment for 02/10/2022.  Durene Romans MD Attending [facility] [employer])

## 2022-02-13 IMAGING — CR DG CHEST 2V
2 series · 2 of 2 positions shown · non-contrast
Comparison: 09/07/2020

CLINICAL DATA: Increasing chest pain

EXAM:
CHEST - 2 VIEW

[w chest lat]
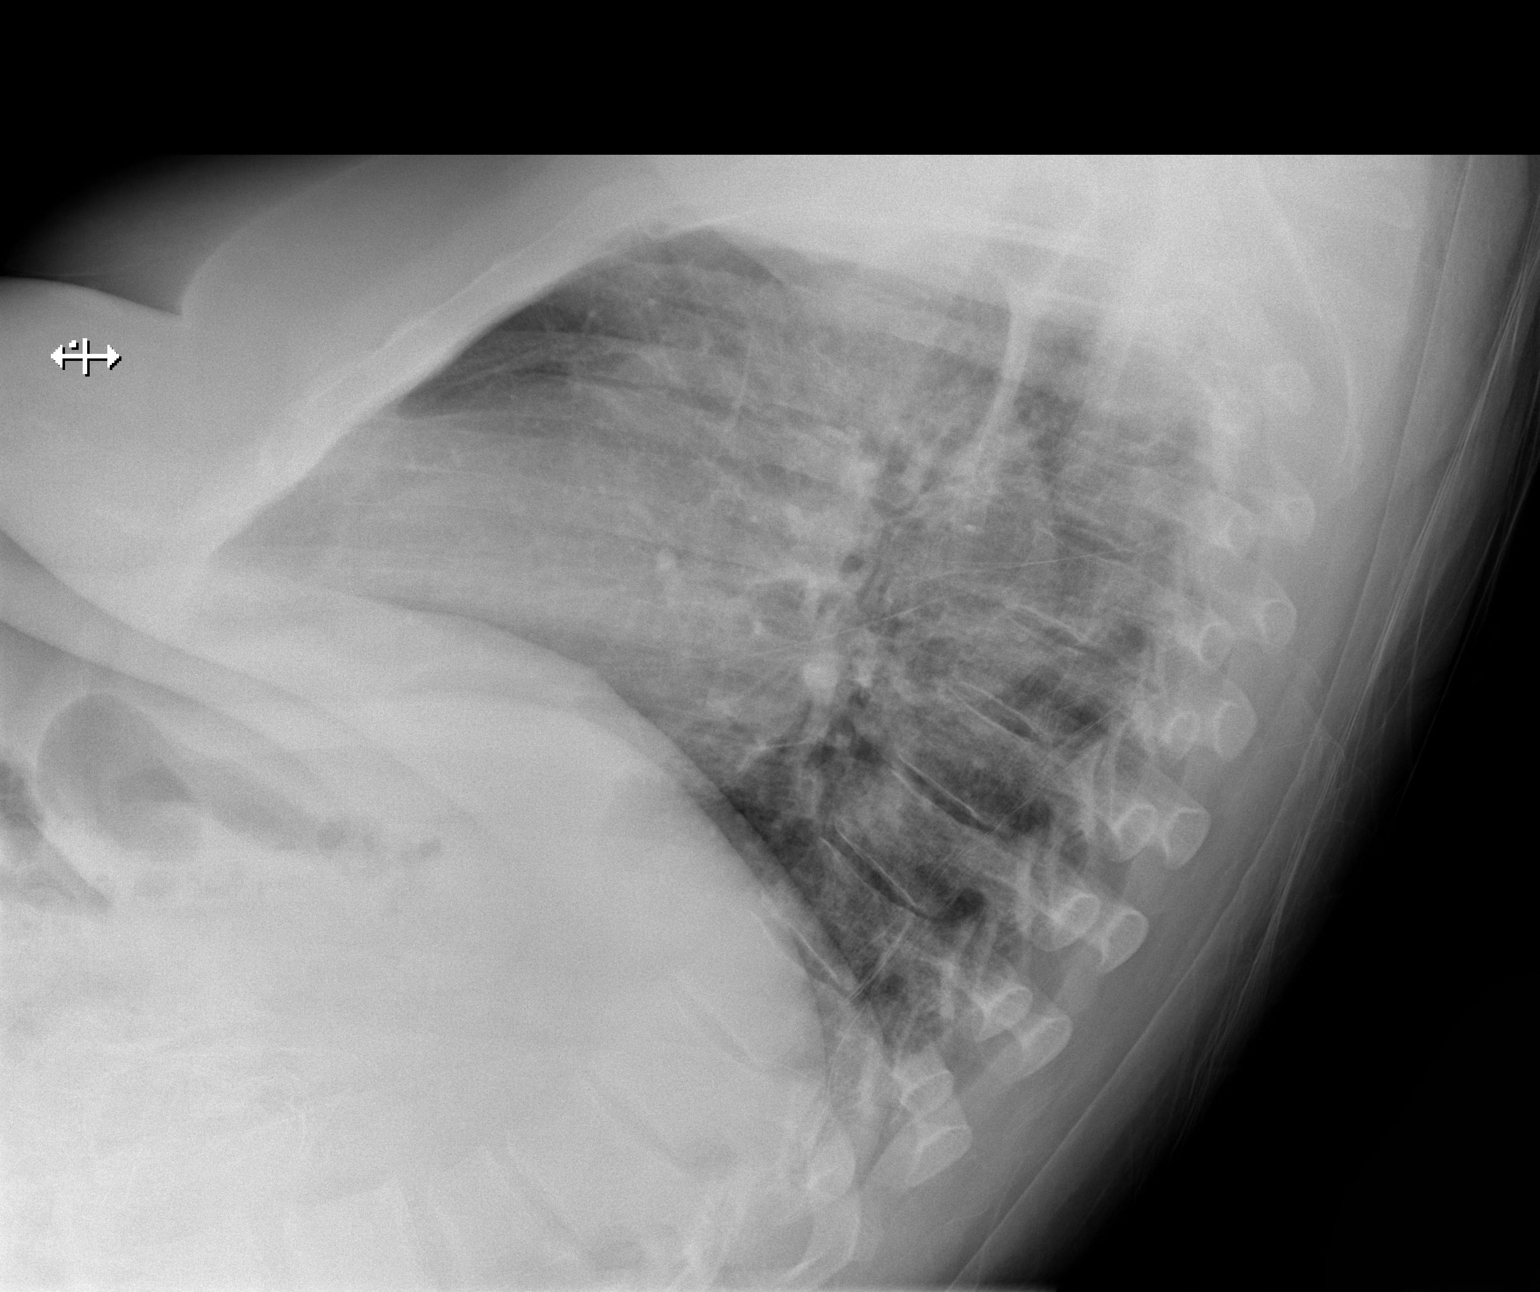

[x chest ap]
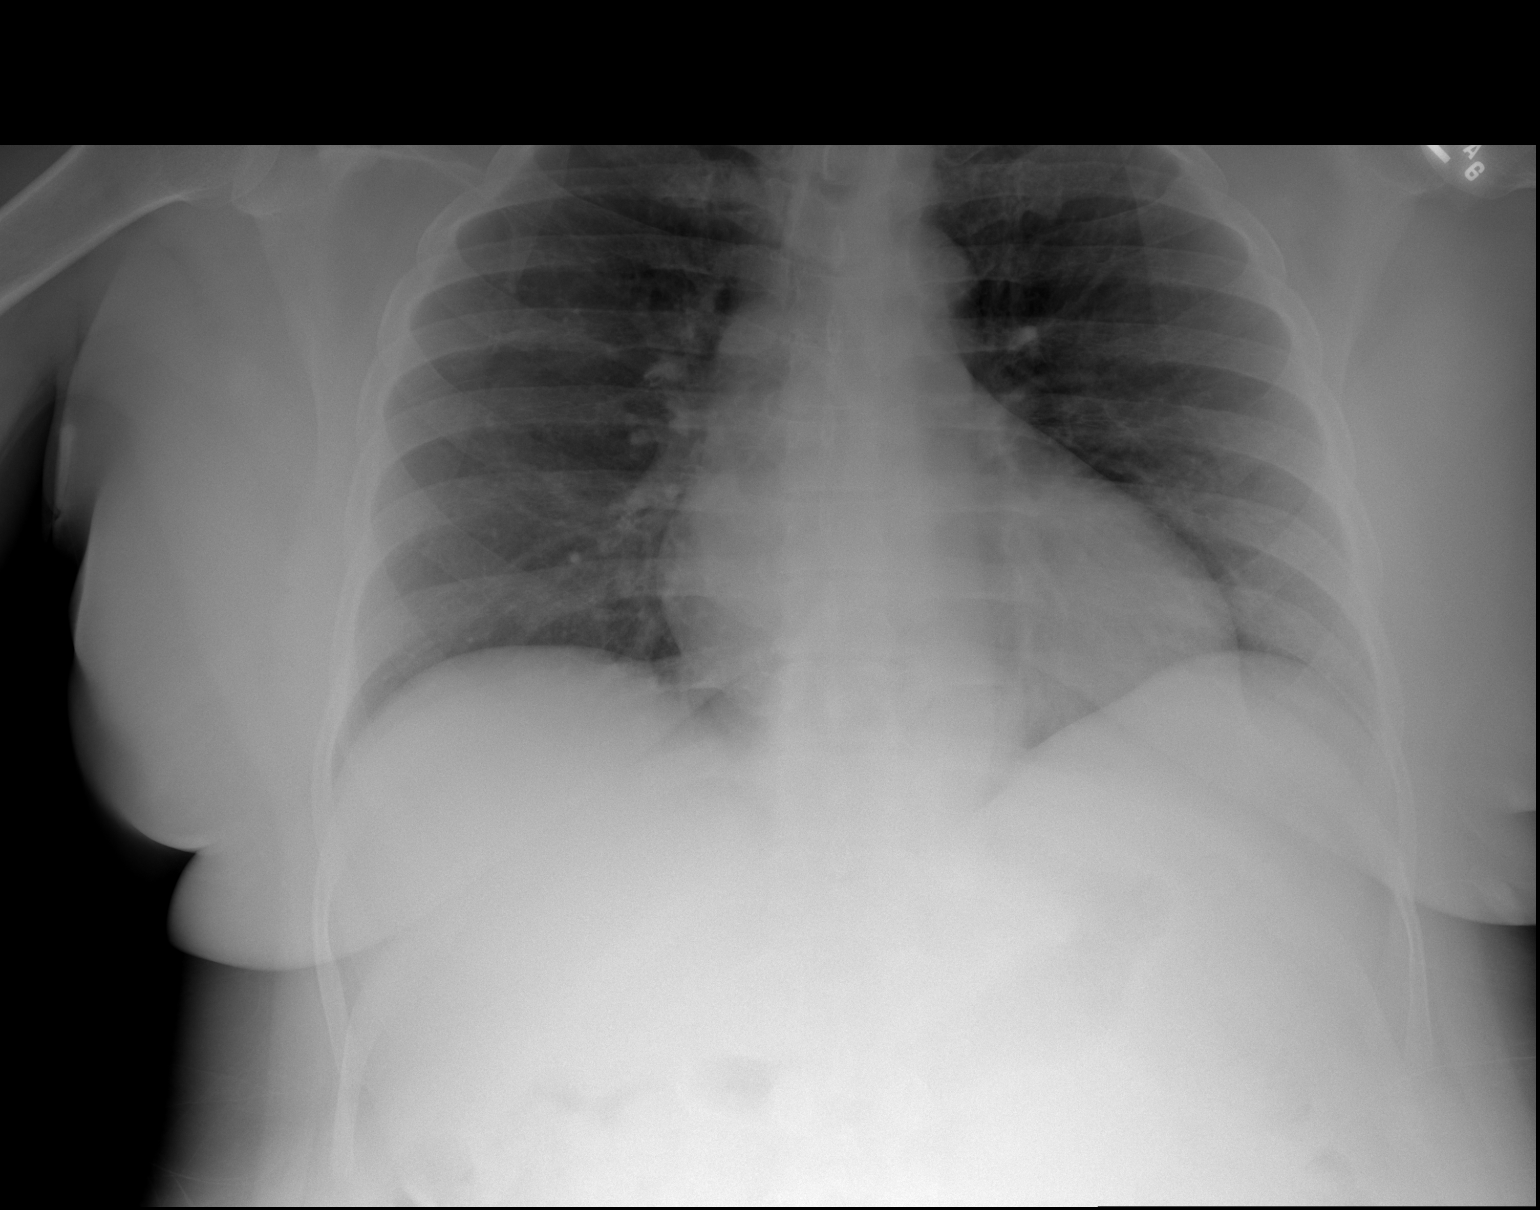

[2 of 2 positions shown; findings below may reference images not displayed]

FINDINGS: The heart size and mediastinal contours are within normal limits.
Both lungs are clear. The visualized skeletal structures are
unremarkable.
IMPRESSION: No active cardiopulmonary disease.

## 2022-02-13 IMAGING — CT CT ANGIO CHEST-ABD-PELV FOR DISSECTION W/ AND WO/W CM
2 of 7 series · 14 of 46 positions shown, 16 images · IV contrast (OMNIPAQUE 350)
Comparison: CT of the abdomen pelvis dated 02/07/2018.

CLINICAL DATA: Chest and back pain.  Concern for aortic dissection.

EXAM:
CT ANGIOGRAPHY CHEST, ABDOMEN AND PELVIS
TECHNIQUE: Non-contrast CT of the chest was initially obtained.

[Series 6: axial arterial · axial · arterial · 0.70mm/px · z∈[+1212,+1701]mm · 11 of 187 slices shown, 13 images]
[im 12/187  soft-tissue]
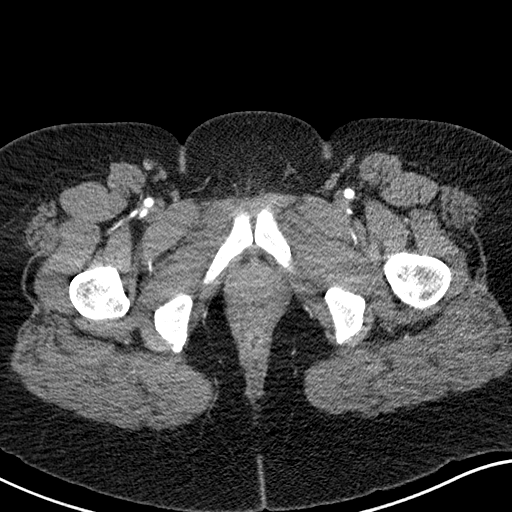
[im 12/187  bone]
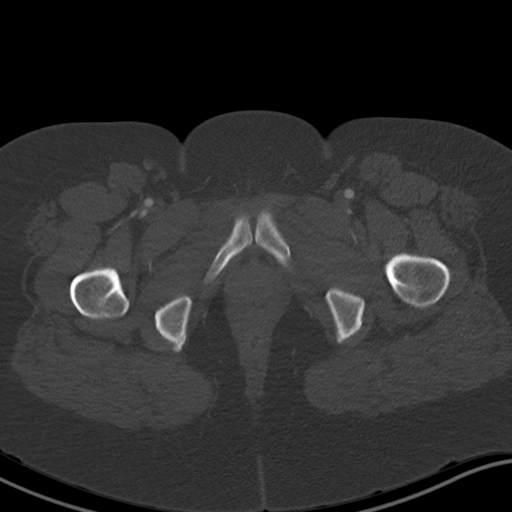
[im 35/187  soft-tissue]
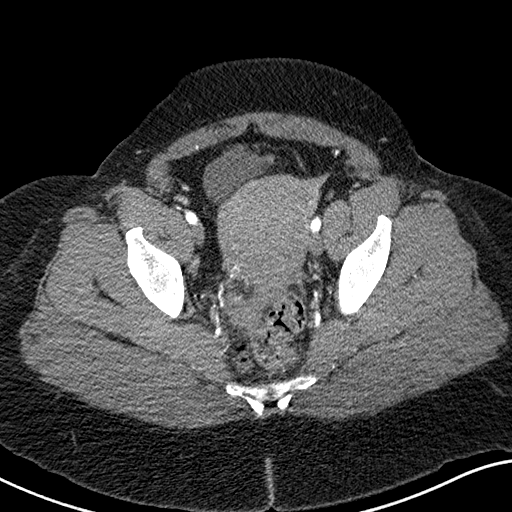
[im 47/187  soft-tissue]
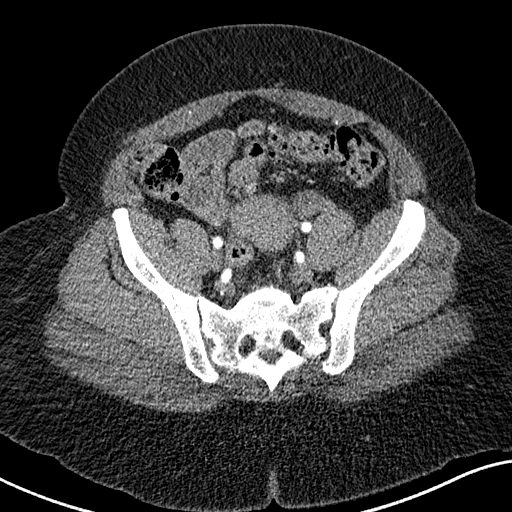
[im 59/187  soft-tissue]
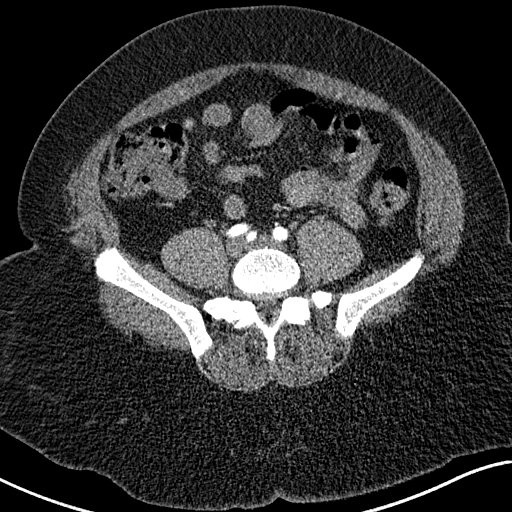
[im 82/187  soft-tissue]
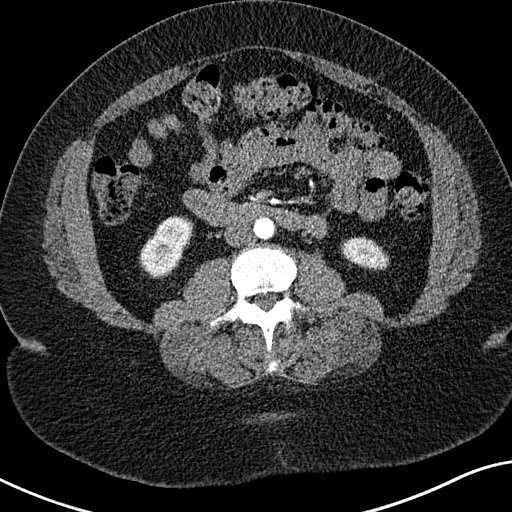
[im 94/187  soft-tissue]
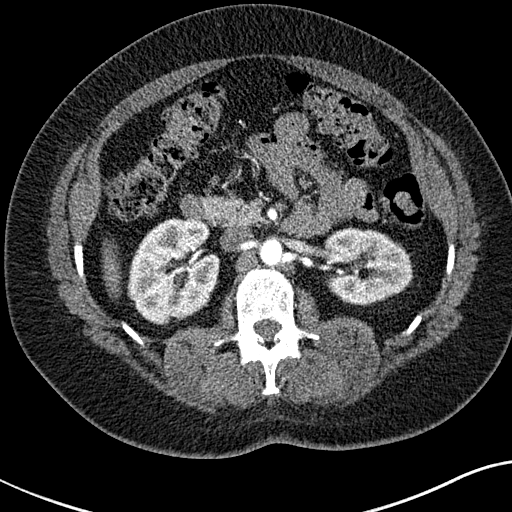
[im 105/187  soft-tissue]
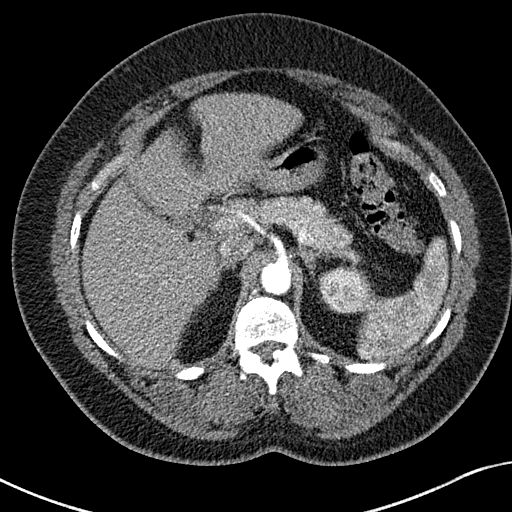
[im 128/187  soft-tissue]
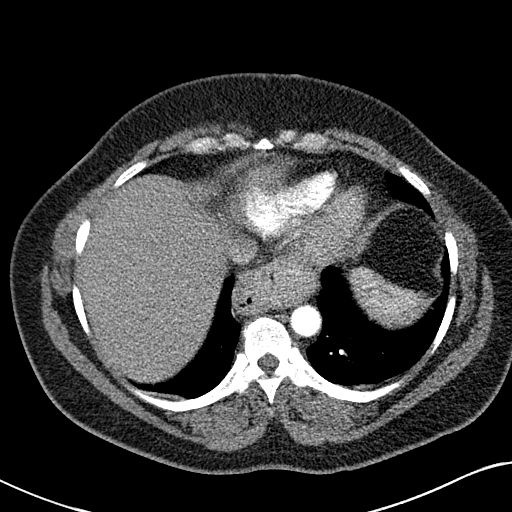
[im 140/187  soft-tissue]
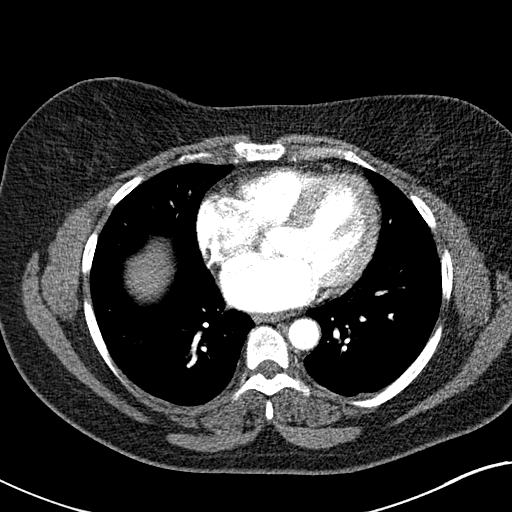
[im 140/187  bone]
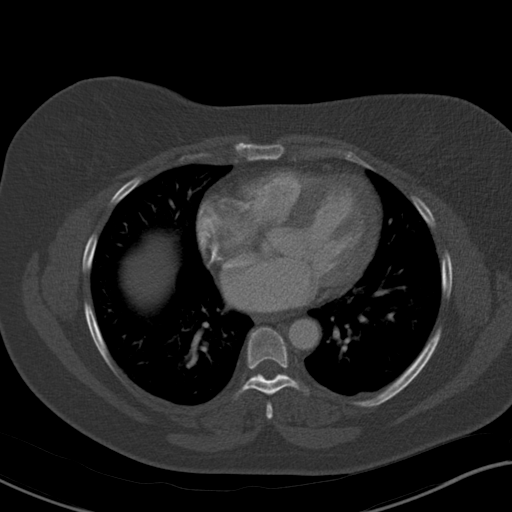
[im 152/187  soft-tissue]
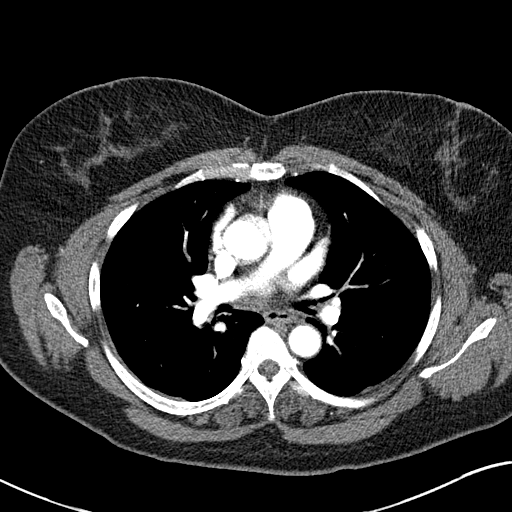
[im 175/187  soft-tissue]
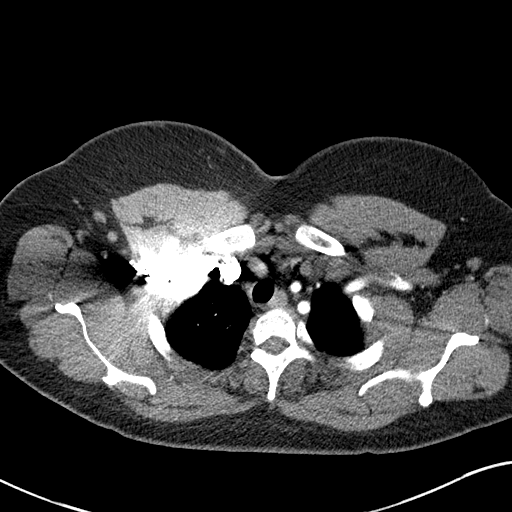

[Series 8: coronals · coronal · 0.74mm/px · 3 of 172 slices shown]
[im 43/172  soft-tissue]
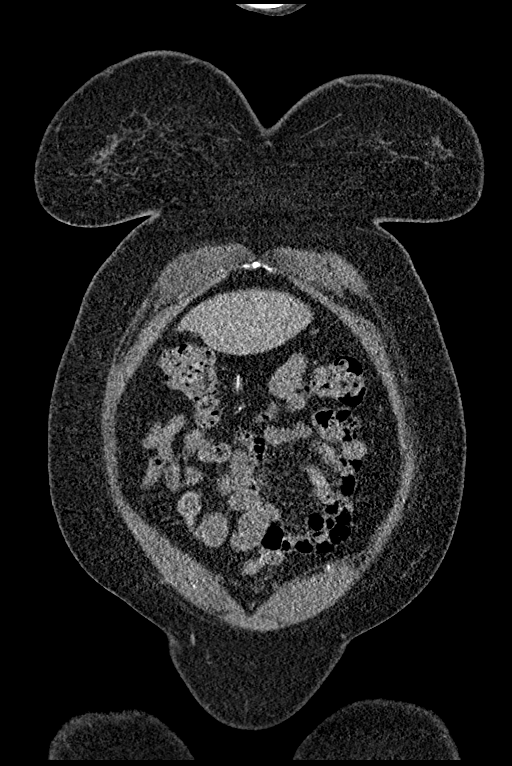
[im 86/172  soft-tissue]
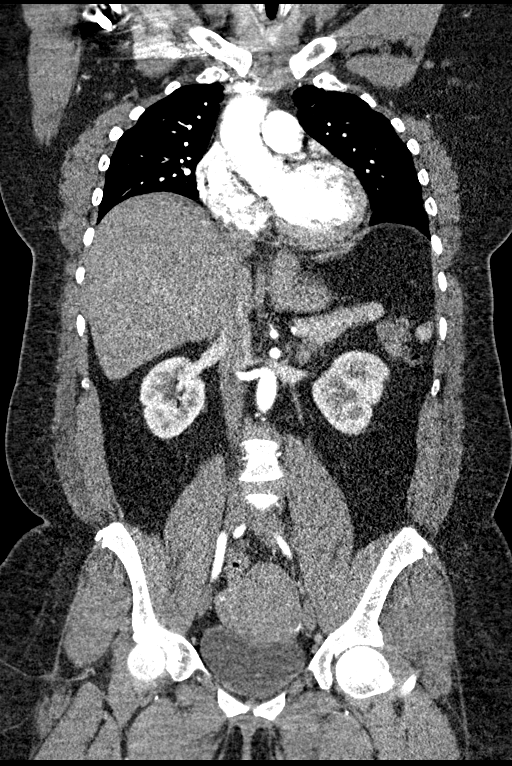
[im 129/172  soft-tissue]
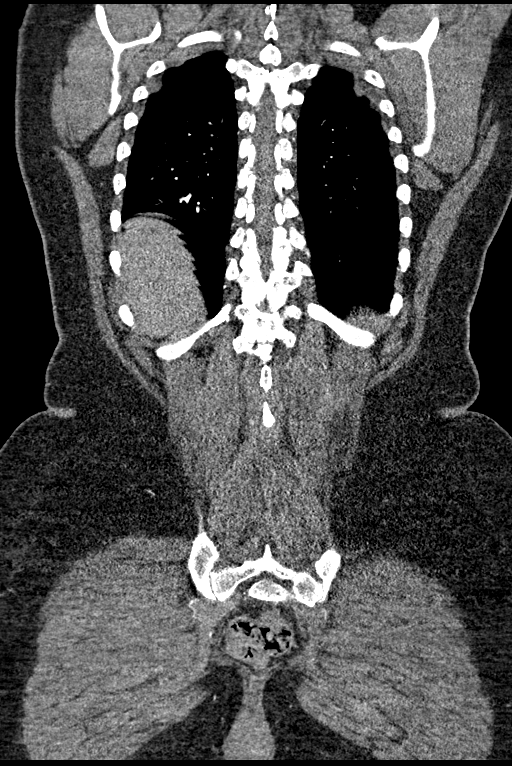

[14 of 46 positions shown; findings below may reference images not displayed]

Multidetector CT imaging through the chest, abdomen and pelvis was
performed using the standard protocol during bolus administration of
intravenous contrast. Multiplanar reconstructed images and MIPs were
obtained and reviewed to evaluate the vascular anatomy.

CONTRAST:  100mL OMNIPAQUE IOHEXOL 350 MG/ML SOLN
FINDINGS: CTA CHEST FINDINGS

Cardiovascular: No cardiomegaly or pericardial effusion. The
thoracic aorta is unremarkable. The origins of the great vessels of
the aortic arch appear patent. No central pulmonary artery embolus
identified.

Mediastinum/Nodes: No hilar or mediastinal adenopathy. There is a
moderate size hiatal hernia. The esophagus and thyroid gland are
grossly unremarkable. No mediastinal fluid collection.

Lungs/Pleura: Biapical linear atelectasis/scarring. No focal
consolidation, pleural effusion, or pneumothorax. The central
airways are patent.

Musculoskeletal: No acute osseous pathology.

Review of the MIP images confirms the above findings.

CTA ABDOMEN AND PELVIS FINDINGS

VASCULAR

Aorta: Normal caliber aorta without aneurysm, dissection, vasculitis
or significant stenosis.

Celiac: Patent without evidence of aneurysm, dissection, vasculitis
or significant stenosis.

SMA: Patent without evidence of aneurysm, dissection, vasculitis or
significant stenosis.

Renals: Both renal arteries are patent without evidence of aneurysm,
dissection, vasculitis, fibromuscular dysplasia or significant
stenosis.

IMA: Patent without evidence of aneurysm, dissection, vasculitis or
significant stenosis.

Inflow: Patent without evidence of aneurysm, dissection, vasculitis
or significant stenosis.

Veins: No obvious venous abnormality within the limitations of this
arterial phase study.

Review of the MIP images confirms the above findings.

NON-VASCULAR

No intra-abdominal free air or free fluid.

Hepatobiliary: No focal liver abnormality is seen. No gallstones,
gallbladder wall thickening, or biliary dilatation.

Pancreas: Unremarkable. No pancreatic ductal dilatation or
surrounding inflammatory changes.

Spleen: Normal in size without focal abnormality.

Adrenals/Urinary Tract: Adrenal glands are unremarkable. Kidneys are
normal, without renal calculi, focal lesion, or hydronephrosis.
Bladder is unremarkable.

Stomach/Bowel: There is sigmoid diverticulosis without active
inflammatory changes. Moderate stool throughout the colon. No bowel
obstruction or active inflammation. Appendectomy.

Lymphatic: No adenopathy.

Reproductive: The uterus is anteverted. There is thickened
appearance of the posterior uterine body which may represent a
fibroid. The endometrium is poorly visualized but appears somewhat
thickened. Further evaluation with pelvic ultrasound on a
nonemergent/outpatient basis recommended. No adnexal masses.

Other: Small fat containing umbilical hernia.

Musculoskeletal: Mild chronic appearing compression fracture
superior endplate of L1 with mild anterior wedging. No acute osseous
pathology.

Review of the MIP images confirms the above findings.
IMPRESSION: 1. No acute intrathoracic, abdominal, or pelvic pathology. No aortic
aneurysm or dissection.
2. Moderate size hiatal hernia.
3. Sigmoid diverticulosis. No bowel obstruction.
4. Probable posterior uterine fibroid and thickened appearance of
the endometrium. Further evaluation with pelvic ultrasound on a
nonemergent/outpatient basis recommended.

## 2022-04-20 ENCOUNTER — Emergency Department (HOSPITAL_COMMUNITY)
Admission: EM | Admit: 2022-04-20 | Discharge: 2022-04-20 | Discharge status: Against Medical Advice | Payer: Self-pay | Attending: Emergency Medicine | Admitting: Emergency Medicine

## 2022-04-20 ENCOUNTER — Emergency Department (HOSPITAL_COMMUNITY): Payer: Commercial Managed Care - HMO

## 2022-04-20 ENCOUNTER — Other Ambulatory Visit: Payer: Self-pay

## 2022-04-20 ENCOUNTER — Encounter (HOSPITAL_COMMUNITY): Payer: Self-pay

## 2022-04-20 DIAGNOSIS — R079 Chest pain, unspecified: Secondary | ICD-10-CM | POA: Diagnosis not present

## 2022-04-20 DIAGNOSIS — Z5321 Procedure and treatment not carried out due to patient leaving prior to being seen by health care provider: Secondary | ICD-10-CM | POA: Insufficient documentation

## 2022-04-20 NOTE — ED Triage Notes (Signed)
C/o left sided cp that is worse with inhalation and lifting left arm x2 hours that started sitting on the couch Denies radiation, sob Ambulatory to triage.

## 2022-06-01 ENCOUNTER — Observation Stay (HOSPITAL_BASED_OUTPATIENT_CLINIC_OR_DEPARTMENT_OTHER)
Admission: EM | Admit: 2022-06-01 | Discharge: 2022-06-02 | Discharge status: Against Medical Advice | Payer: BLUE CROSS/BLUE SHIELD | Source: Home / Self Care | Attending: Emergency Medicine | Admitting: Emergency Medicine

## 2022-06-01 ENCOUNTER — Encounter (HOSPITAL_COMMUNITY): Payer: Self-pay

## 2022-06-01 ENCOUNTER — Other Ambulatory Visit: Payer: Self-pay

## 2022-06-01 ENCOUNTER — Emergency Department (HOSPITAL_COMMUNITY): Payer: BLUE CROSS/BLUE SHIELD

## 2022-06-01 DIAGNOSIS — Z6839 Body mass index (BMI) 39.0-39.9, adult: Secondary | ICD-10-CM | POA: Insufficient documentation

## 2022-06-01 DIAGNOSIS — J45901 Unspecified asthma with (acute) exacerbation: Secondary | ICD-10-CM | POA: Diagnosis not present

## 2022-06-01 DIAGNOSIS — K219 Gastro-esophageal reflux disease without esophagitis: Secondary | ICD-10-CM | POA: Diagnosis present

## 2022-06-01 DIAGNOSIS — Z79899 Other long term (current) drug therapy: Secondary | ICD-10-CM | POA: Insufficient documentation

## 2022-06-01 DIAGNOSIS — R079 Chest pain, unspecified: Secondary | ICD-10-CM | POA: Insufficient documentation

## 2022-06-01 DIAGNOSIS — J4541 Moderate persistent asthma with (acute) exacerbation: Secondary | ICD-10-CM

## 2022-06-01 DIAGNOSIS — Z1152 Encounter for screening for COVID-19: Secondary | ICD-10-CM | POA: Insufficient documentation

## 2022-06-01 DIAGNOSIS — E66812 Obesity, class 2: Secondary | ICD-10-CM | POA: Diagnosis present

## 2022-06-01 DIAGNOSIS — I10 Essential (primary) hypertension: Secondary | ICD-10-CM | POA: Insufficient documentation

## 2022-06-01 DIAGNOSIS — J4542 Moderate persistent asthma with status asthmaticus: Secondary | ICD-10-CM | POA: Diagnosis not present

## 2022-06-01 DIAGNOSIS — E669 Obesity, unspecified: Secondary | ICD-10-CM | POA: Insufficient documentation

## 2022-06-01 DIAGNOSIS — J45909 Unspecified asthma, uncomplicated: Secondary | ICD-10-CM | POA: Diagnosis not present

## 2022-06-01 LAB — BASIC METABOLIC PANEL
Anion gap: 7 (ref 5–15)
BUN: 13 mg/dL (ref 6–20)
CO2: 28 mmol/L (ref 22–32)
Calcium: 8.8 mg/dL — ABNORMAL LOW (ref 8.9–10.3)
Chloride: 102 mmol/L (ref 98–111)
Creatinine, Ser: 0.92 mg/dL (ref 0.44–1.00)
GFR, Estimated: >60 mL/min (ref >60)
Glucose, Bld: 99 mg/dL (ref 70–99)
Potassium: 3.7 mmol/L (ref 3.5–5.1)
Sodium: 137 mmol/L (ref 135–145)

## 2022-06-01 LAB — HEPATIC FUNCTION PANEL
ALT: 20 U/L (ref 0–44)
AST: 25 U/L (ref 15–41)
Albumin: 4.2 g/dL (ref 3.5–5.0)
Alkaline Phosphatase: 81 U/L (ref 38–126)
Bilirubin, Direct: <0.1 mg/dL (ref 0.0–0.2)
Total Bilirubin: 0.3 mg/dL (ref 0.3–1.2)
Total Protein: 7.7 g/dL (ref 6.5–8.1)

## 2022-06-01 LAB — TROPONIN I (HIGH SENSITIVITY)
Troponin I (High Sensitivity): 7 ng/L (ref <18)
Troponin I (High Sensitivity): 2 ng/L (ref <18)
Troponin I (High Sensitivity): 3 ng/L (ref <18)

## 2022-06-01 LAB — RESP PANEL BY RT-PCR (RSV, FLU A&B, COVID)  RVPGX2
Influenza A by PCR: NEGATIVE
Influenza B by PCR: NEGATIVE
Resp Syncytial Virus by PCR: NEGATIVE
SARS Coronavirus 2 by RT PCR: NEGATIVE

## 2022-06-01 LAB — CBC
HCT: 38.7 % (ref 36.0–46.0)
Hemoglobin: 12 g/dL (ref 12.0–15.0)
MCH: 26.1 pg (ref 26.0–34.0)
MCHC: 31 g/dL (ref 30.0–36.0)
MCV: 84.1 fL (ref 80.0–100.0)
Platelets: 322 10*3/uL (ref 150–400)
RBC: 4.6 MIL/uL (ref 3.87–5.11)
RDW: 14.8 % (ref 11.5–15.5)
WBC: 7.3 10*3/uL (ref 4.0–10.5)
nRBC: 0 % (ref 0.0–0.2)

## 2022-06-01 LAB — PHOSPHORUS: Phosphorus: 3.4 mg/dL (ref 2.5–4.6)

## 2022-06-01 LAB — BRAIN NATRIURETIC PEPTIDE: B Natriuretic Peptide: 16.4 pg/mL (ref 0.0–100.0)

## 2022-06-01 MED ORDER — NITROGLYCERIN 0.4 MG SL SUBL
0.4000 mg | SUBLINGUAL_TABLET | SUBLINGUAL | Status: DC | PRN
  Administered 2022-06-01 (×3): 0.4 mg via SUBLINGUAL
  Filled 2022-06-01: qty 1

## 2022-06-01 MED ORDER — POTASSIUM CHLORIDE CRYS ER 20 MEQ PO TBCR
40.0000 meq | EXTENDED_RELEASE_TABLET | Freq: Once | ORAL | Status: AC
  Administered 2022-06-01: 40 meq via ORAL
  Filled 2022-06-01: qty 2

## 2022-06-01 MED ORDER — ASPIRIN 81 MG PO TBEC: 81.0000 mg | DELAYED_RELEASE_TABLET | Freq: Every day | ORAL | Status: DC

## 2022-06-01 MED ORDER — METHYLPREDNISOLONE SODIUM SUCC 125 MG IJ SOLR
125.0000 mg | Freq: Once | INTRAMUSCULAR | Status: AC
  Administered 2022-06-01: 125 mg via INTRAVENOUS
  Filled 2022-06-01: qty 2

## 2022-06-01 MED ORDER — ACETAMINOPHEN 650 MG RE SUPP: 650.0000 mg | Freq: Four times a day (QID) | RECTAL | Status: DC | PRN

## 2022-06-01 MED ORDER — AMLODIPINE BESYLATE 5 MG PO TABS
10.0000 mg | ORAL_TABLET | Freq: Once | ORAL | Status: AC
  Administered 2022-06-01: 10 mg via ORAL
  Filled 2022-06-01: qty 2

## 2022-06-01 MED ORDER — MAGNESIUM SULFATE 2 GM/50ML IV SOLN
2.0000 g | Freq: Once | INTRAVENOUS | Status: AC
  Administered 2022-06-01: 2 g via INTRAVENOUS
  Filled 2022-06-01: qty 50

## 2022-06-01 MED ORDER — ALBUTEROL SULFATE (2.5 MG/3ML) 0.083% IN NEBU: 2.5000 mg | INHALATION_SOLUTION | RESPIRATORY_TRACT | Status: DC | PRN

## 2022-06-01 MED ORDER — KETOROLAC TROMETHAMINE 30 MG/ML IJ SOLN
30.0000 mg | Freq: Once | INTRAMUSCULAR | Status: AC
  Administered 2022-06-01: 30 mg via INTRAVENOUS
  Filled 2022-06-01: qty 1

## 2022-06-01 MED ORDER — METHYLPREDNISOLONE SODIUM SUCC 40 MG IJ SOLR
40.0000 mg | Freq: Once | INTRAMUSCULAR | Status: AC
  Administered 2022-06-01: 40 mg via INTRAVENOUS
  Filled 2022-06-01: qty 1

## 2022-06-01 MED ORDER — ONDANSETRON HCL 4 MG/2ML IJ SOLN: 4.0000 mg | Freq: Four times a day (QID) | INTRAMUSCULAR | Status: DC | PRN

## 2022-06-01 MED ORDER — ACETAMINOPHEN 325 MG PO TABS: 650.0000 mg | ORAL_TABLET | Freq: Four times a day (QID) | ORAL | Status: DC | PRN

## 2022-06-01 MED ORDER — NITROGLYCERIN 2 % TD OINT
1.0000 [in_us] | TOPICAL_OINTMENT | Freq: Once | TRANSDERMAL | Status: AC
  Administered 2022-06-01: 1 [in_us] via TOPICAL
  Filled 2022-06-01: qty 1

## 2022-06-01 MED ORDER — ONDANSETRON HCL 4 MG PO TABS: 4.0000 mg | ORAL_TABLET | Freq: Four times a day (QID) | ORAL | Status: DC | PRN

## 2022-06-01 MED ORDER — ENOXAPARIN SODIUM 40 MG/0.4ML IJ SOSY
40.0000 mg | PREFILLED_SYRINGE | INTRAMUSCULAR | Status: DC
  Administered 2022-06-01: 40 mg via SUBCUTANEOUS
  Filled 2022-06-01: qty 0.4

## 2022-06-01 MED ORDER — METOPROLOL TARTRATE 25 MG PO TABS
25.0000 mg | ORAL_TABLET | Freq: Two times a day (BID) | ORAL | Status: DC
  Administered 2022-06-01 (×2): 25 mg via ORAL
  Filled 2022-06-01 (×2): qty 1

## 2022-06-01 MED ORDER — ASPIRIN 81 MG PO CHEW
324.0000 mg | CHEWABLE_TABLET | Freq: Once | ORAL | Status: AC
  Administered 2022-06-01: 324 mg via ORAL
  Filled 2022-06-01: qty 4

## 2022-06-01 MED ORDER — PREDNISONE 20 MG PO TABS: 40.0000 mg | ORAL_TABLET | Freq: Every day | ORAL | Status: DC

## 2022-06-01 MED ORDER — BENAZEPRIL HCL 5 MG PO TABS
10.0000 mg | ORAL_TABLET | Freq: Every day | ORAL | Status: DC
  Filled 2022-06-01: qty 2

## 2022-06-01 MED ORDER — HYDROCOD POLI-CHLORPHE POLI ER 10-8 MG/5ML PO SUER
5.0000 mL | Freq: Once | ORAL | Status: AC
  Administered 2022-06-01: 5 mL via ORAL
  Filled 2022-06-01: qty 5

## 2022-06-01 MED ORDER — DM-GUAIFENESIN ER 30-600 MG PO TB12
1.0000 | ORAL_TABLET | Freq: Two times a day (BID) | ORAL | Status: DC
  Administered 2022-06-01 (×2): 1 via ORAL
  Filled 2022-06-01 (×2): qty 1

## 2022-06-01 MED ORDER — IPRATROPIUM-ALBUTEROL 0.5-2.5 (3) MG/3ML IN SOLN
3.0000 mL | RESPIRATORY_TRACT | Status: AC
  Administered 2022-06-01 (×3): 3 mL via RESPIRATORY_TRACT
  Filled 2022-06-01 (×3): qty 3

## 2022-06-01 MED ORDER — ALBUTEROL SULFATE (2.5 MG/3ML) 0.083% IN NEBU
10.0000 mg/h | INHALATION_SOLUTION | RESPIRATORY_TRACT | Status: DC
  Administered 2022-06-01: 10 mg/h via RESPIRATORY_TRACT
  Filled 2022-06-01: qty 3

## 2022-06-01 MED ORDER — AMLODIPINE BESYLATE 10 MG PO TABS: 10.0000 mg | ORAL_TABLET | Freq: Every day | ORAL | Status: DC

## 2022-06-01 MED ORDER — IPRATROPIUM-ALBUTEROL 0.5-2.5 (3) MG/3ML IN SOLN
3.0000 mL | Freq: Four times a day (QID) | RESPIRATORY_TRACT | Status: DC
  Administered 2022-06-01 (×3): 3 mL via RESPIRATORY_TRACT
  Filled 2022-06-01 (×3): qty 3

## 2022-06-01 MED ORDER — PROCHLORPERAZINE EDISYLATE 10 MG/2ML IJ SOLN: 10.0000 mg | Freq: Four times a day (QID) | INTRAMUSCULAR | Status: DC | PRN

## 2022-06-01 NOTE — H&P (Signed)
History and Physical    Patient: Briana Wade ZOX:[id number] DOB: [birth year] DOA: 06/01/2022 DOS: the patient was seen and examined on 06/01/2022 PCP: [facility]  Patient coming from: Home  Chief Complaint:  Chief Complaint  Patient presents with   Shortness of Breath   HPI: Briana Wade is a 52 y.o. female with medical history significant of unspecified abdominal pain, seasonal allergies, asthma, anemia, chlamydia, diverticulosis, diverticulitis, GERD, hiatal hernia, IBS, hypertension, insomnia lactose intolerant, migraine equivalent, trichomoniasis, tubular adenoma, umbilical hernia, uterine fibroid who presented with progressively worsening dyspnea, wheezing, pleuritic CP and nonproductive cough for several days with no relief from albuterol inhaler.  No exposure to fumes.  She is not a smoker.  She denied fever, chills, rhinorrhea, sore throat or hemoptysis.  No palpitations, diaphoresis, PND, orthopnea or pitting edema of the lower extremities.  No abdominal pain, nausea, emesis, diarrhea, constipation, melena or hematochezia.  No flank pain, dysuria, frequency or hematuria.  No polyuria, polydipsia, polyphagia or blurred vision.   ED course: Initial vital signs were temperature 98.5 F, pulse 85, respirations 22, BP 181/118 mmHg and O2 sat 100% on room air.  The patient received amlodipine 10 mg p.o. x 1, 5 mL of 2 CNX, 3 DuoNebs, magnesium sulfate 2 g IVPB, methylprednisolone 125 mg IVP and a Nitropaste patch.  Lab work: Her CBC, troponin and BNP were normal.  Negative coronavirus, influenza A/B and RSV PCR.  BMP was normal except for calcium of 8.9 mg/dL.  Imaging: Portable 1 view chest radiograph with low lung volumes and no acute findings.   Review of Systems: As mentioned in the history of present illness. All other systems reviewed and are negative. Past Medical History:  Diagnosis Date   Abdominal pain, unspecified site    Allergy    Anemia    Asthma     Chlamydia    Diverticulitis    Esophageal reflux    Hiatal hernia    Hypertension    IBS (irritable bowel syndrome)    Insomnia    Lactose intolerance    Migraine equivalent 08/2014   minor right arm weakness in [facility]. neg CVA on imaging.    Other constipation    Personal history of colonic polyps 09/12/2010   tubular adenoma   Trichimoniasis    Tubular adenoma of colon    Umbilical hernia    Uterine fibroid    Uterine leiomyoma    Past Surgical History:  Procedure Laterality Date   APPENDECTOMY     COLONOSCOPY     DILATION AND CURETTAGE OF UTERUS     x2 for EAB   HYSTEROSCOPY WITH NOVASURE N/A 12/17/2015   Procedure: HYSTEROSCOPY WITH NOVASURE;  Surgeon: North Gates Bing, MD;  Location: [facility];  Service: Gynecology;  Laterality: N/A;   TUBAL LIGATION     Social History:  reports that she has never smoked. She has never used smokeless tobacco. She reports that she does not drink alcohol and does not use drugs.  Allergies  Allergen Reactions   Lactose Intolerance (Gi)     Family History  Problem Relation Age of Onset   Hypertension Mother    Cancer Mother        not sure of type   Asthma Cousin    Asthma Son    Colon cancer Neg Hx    Esophageal cancer Neg Hx    Rectal cancer Neg Hx    Stomach cancer Neg Hx     Prior to Admission medications  Medication Sig Start Date End Date Taking? Authorizing Provider  acetaminophen (TYLENOL) 325 MG tablet Take 2 tablets (650 mg total) by mouth every 6 (six) hours as needed. 06/14/21  Yes Tanda Rockers A, DO  amoxicillin (AMOXIL) 875 MG tablet Take 875 mg by mouth 2 (two) times daily. 04/02/22  Yes [provider]  ibuprofen (ADVIL) 600 MG tablet Take 1 tablet (600 mg total) by mouth every 6 (six) hours as needed. 06/14/21  Yes Tanda Rockers A, DO  metoprolol tartrate (LOPRESSOR) 25 MG tablet Take 25 mg by mouth 2 (two) times daily. 04/01/20  Yes [provider]  amLODipine (NORVASC) 5 MG tablet Take 1 tablet (5 mg  total) by mouth daily. Patient not taking: Reported on 06/01/2022 08/05/20 12/03/20  Elvin So C, PA-C  amoxicillin-clavulanate (AUGMENTIN) 875-125 MG tablet Take 1 tablet by mouth every 12 (twelve) hours. Patient not taking: Reported on 06/01/2022 09/28/21   Dartha Lodge, PA-C  diphenhydrAMINE (BENADRYL) 25 MG tablet Take 1 tablet (25 mg total) by mouth every 6 (six) hours as needed. Patient not taking: Reported on 06/01/2022 08/08/20   Arby Barrette, MD  docusate sodium (COLACE) 100 MG capsule Take 1 capsule (100 mg total) by mouth daily as needed for mild constipation. Patient not taking: Reported on 06/01/2022 09/28/21   Dartha Lodge, PA-C  metoCLOPramide (REGLAN) 10 MG tablet Take 1 tablet (10 mg total) by mouth every 6 (six) hours. Patient not taking: Reported on 09/07/2020 08/08/20   Arby Barrette, MD  ondansetron (ZOFRAN) 4 MG tablet Take 1 tablet (4 mg total) by mouth every 6 (six) hours. Patient not taking: Reported on 06/01/2022 09/28/21   Dartha Lodge, PA-C  oxyCODONE-acetaminophen (PERCOCET) 5-325 MG tablet Take 1 tablet by mouth every 6 (six) hours as needed. Patient not taking: Reported on 06/01/2022 09/28/21   Dartha Lodge, [place]    Physical Exam: Vitals:   06/01/22 0551 06/01/22 0600 06/01/22 0614 06/01/22 0620  BP: (!) 183/98 (!) 173/106  (!) 153/103  Pulse: 73 92  96  Resp: 20 (!) 21  19  Temp:   98 F (36.7 C)   TempSrc:   Oral   SpO2: 97% 97%  93%  Weight:      Height:       Physical Exam Vitals and nursing note reviewed.  Constitutional:      General: She is awake. She is not in acute distress.    Appearance: She is well-developed. She is obese.     Interventions: Nasal cannula in place.  HENT:     Head: Normocephalic.     Nose: No rhinorrhea.     Mouth/Throat:     Mouth: Mucous membranes are moist.  Eyes:     General: No scleral icterus.    Pupils: Pupils are equal, round, and reactive to light.  Neck:     Vascular: No JVD.  Cardiovascular:     Rate and  Rhythm: Normal rate and regular rhythm.     Heart sounds: S1 normal and S2 normal.  Pulmonary:     Effort: Tachypnea present.     Breath sounds: Decreased breath sounds and wheezing present. No rhonchi or rales.  Abdominal:     General: Bowel sounds are normal.     Palpations: Abdomen is soft.  Musculoskeletal:     Cervical back: Neck supple.     Right lower leg: No edema.     Left lower leg: No edema.  Skin:    General:  Skin is warm and dry.  Neurological:     General: No focal deficit present.     Mental Status: She is alert and oriented to person, place, and time.  Psychiatric:        Mood and Affect: Mood normal.        Behavior: Behavior normal. Behavior is cooperative.   Data Reviewed:  Results are pending, will review when available.  EKG: Vent. rate 83 BPM PR interval 145 ms QRS duration 76 ms QT/QTcB 386/454 ms P-R-T axes 37 0 40 Sinus rhythm  Assessment and Plan: Principal Problem:   Acute asthma exacerbation Observation/MedSurg. Continue supplemental oxygen. Methylprednisolone 125 mg IVP x1 given. Followed by prednisone 40 mg p.o. daily in a.m. Scheduled and as needed bronchodilators. Follow-up CBC and chemistry in the morning.   Active Problems:   HTN (hypertension) Continue amlodipine 10 mg p.o. daily. Continue benazepril 10 mg p.o. daily. Continue metoprolol 25 mg p.o. twice daily.    GERD (gastroesophageal reflux disease) Antiacid, H2 blocker or PPI as needed.    Class II obesity Current BMI 39.68 kg/m. Would benefit from lifestyle modifications and weight loss. Follow-up with primary care provider.    Hypocalcemia Check albumin level. Further action depending on results.    Advance Care Planning:   Code Status: Full Code   Consults:   Family Communication:   Severity of Illness: The appropriate patient status for this patient is OBSERVATION. Observation status is judged to be reasonable and necessary in order to provide the  required intensity of service to ensure the patient's safety. The patient's presenting symptoms, physical exam findings, and initial radiographic and laboratory data in the context of their medical condition is felt to place them at decreased risk for further clinical deterioration. Furthermore, it is anticipated that the patient will be medically stable for discharge from the hospital within 2 midnights of admission.   Author: Bobette Mo, MD 06/01/2022 7:49 AM  For on call review [email or web address].   This document was prepared using Dragon voice recognition software and may contain some unintended transcription errors.

## 2022-06-01 NOTE — ED Provider Notes (Signed)
[place] EMERGENCY DEPARTMENT AT [facility] Provider Note   CSN: [id number] Arrival date & time: 06/01/22  0209     History  Chief Complaint  Patient presents with   Shortness of Breath    Briana Wade is a 52 y.o. female.  Patient with history of asthma, hypertension presents today with complaints of shortness of breath. She states that same began a few days ago and has been worsening since then. She endorses some chest tightness as well. She has been taking her home inhalers with no relief. No fevers or chills. No sick contacts. She has been coughing. She states she has never been to the ER for an asthma exacerbation before. Denies any leg pain or leg swelling or abdominal distention. No recent travel or recent surgeries. She does mention that her blood pressure medications were changed recently from amlodipine 5 mg to 10 mg and Benazepril was added as well. She states she thought this could be making her short of breath and so she stopped taking her blood pressure medications but did not have any improvement. She does not smoke. Denies any cardiac history.  The history is provided by the patient. No language interpreter was used.  Shortness of Breath Associated symptoms: cough and wheezing        Home Medications Prior to Admission medications   Medication Sig Start Date End Date Taking? Authorizing Provider  acetaminophen (TYLENOL) 325 MG tablet Take 2 tablets (650 mg total) by mouth every 6 (six) hours as needed. 06/14/21   Sloan Leiter, DO  amLODipine (NORVASC) 5 MG tablet Take 1 tablet (5 mg total) by mouth daily. 08/05/20 12/03/20  Cantwell, Celeste C, PA-C  amoxicillin-clavulanate (AUGMENTIN) 875-125 MG tablet Take 1 tablet by mouth every 12 (twelve) hours. 09/28/21   Dartha Lodge, PA-C  diphenhydrAMINE (BENADRYL) 25 MG tablet Take 1 tablet (25 mg total) by mouth every 6 (six) hours as needed. 08/08/20   Arby Barrette, MD  docusate sodium (COLACE) 100 MG  capsule Take 1 capsule (100 mg total) by mouth daily as needed for mild constipation. 09/28/21   Dartha Lodge, PA-C  ibuprofen (ADVIL) 600 MG tablet Take 1 tablet (600 mg total) by mouth every 6 (six) hours as needed. 06/14/21   Sloan Leiter, DO  metoCLOPramide (REGLAN) 10 MG tablet Take 1 tablet (10 mg total) by mouth every 6 (six) hours. Patient not taking: Reported on 09/07/2020 08/08/20   Arby Barrette, MD  metoprolol tartrate (LOPRESSOR) 25 MG tablet Take 25 mg by mouth 2 (two) times daily. Patient not taking: Reported on 11/28/2020 04/01/20   [provider]  ondansetron (ZOFRAN) 4 MG tablet Take 1 tablet (4 mg total) by mouth every 6 (six) hours. 09/28/21   Dartha Lodge, PA-C  oxyCODONE-acetaminophen (PERCOCET) 5-325 MG tablet Take 1 tablet by mouth every 6 (six) hours as needed. 09/28/21   Dartha Lodge, PA-C      Allergies    Lactose intolerance (gi)    Review of Systems   Review of Systems  Respiratory:  Positive for cough, shortness of breath and wheezing.   All other systems reviewed and are negative.   Physical Exam Updated Vital Signs BP (!) 195/105   Pulse 83   Temp 98.5 F (36.9 C) (Oral)   Resp (!) 21   Ht 5\' 1"  (1.549 m)   Wt 95.3 kg   SpO2 99%   BMI 39.68 kg/m  Physical Exam Vitals and nursing note  reviewed.  Constitutional:      General: She is not in acute distress.    Appearance: Normal appearance. She is normal weight. She is not ill-appearing, toxic-appearing or diaphoretic.  HENT:     Head: Normocephalic and atraumatic.  Cardiovascular:     Rate and Rhythm: Normal rate and regular rhythm.     Heart sounds: Normal heart sounds.  Pulmonary:     Effort: No respiratory distress.     Breath sounds: Wheezing present.  Abdominal:     Palpations: Abdomen is soft.     Tenderness: There is no abdominal tenderness.  Musculoskeletal:        General: Normal range of motion.     Cervical back: Normal range of motion.     Right lower leg: No  tenderness. No edema.     Left lower leg: No tenderness. No edema.  Skin:    General: Skin is warm and dry.  Neurological:     General: No focal deficit present.     Mental Status: She is alert.  Psychiatric:        Mood and Affect: Mood normal.        Behavior: Behavior normal.     ED Results / Procedures / Treatments   Labs (all labs ordered are listed, but only abnormal results are displayed) Labs Reviewed  BASIC METABOLIC PANEL - Abnormal; Notable for the following components:      Result Value   Calcium 8.8 (*)    All other components within normal limits  RESP PANEL BY RT-PCR (RSV, FLU A&B, COVID)  RVPGX2  CBC  BRAIN NATRIURETIC PEPTIDE  TROPONIN I (HIGH SENSITIVITY)    EKG EKG Interpretation  Date/Time:  Monday Jun 01 2022 02:59:27 EDT Ventricular Rate:  83 PR Interval:  145 QRS Duration: 76 QT Interval:  386 QTC Calculation: 454 R Axis:   0 Text Interpretation: Sinus rhythm Confirmed by Tilden Fossa [phone]) on 06/01/2022 5:05:22 AM  Radiology DG Chest Portable 1 View  Result Date: 06/01/2022 CLINICAL DATA:  Shortness of breath. EXAM: PORTABLE CHEST 1 VIEW COMPARISON:  04/20/2022 FINDINGS: Lungs are suboptimally inflated. Heart size and mediastinal contours appear stable. No pleural fluid or airspace disease identified. Visualized osseous structures are unremarkable. IMPRESSION: Low lung volumes. No acute findings. Electronically Signed   By: Signa Kell M.D.   On: 06/01/2022 04:51    Procedures Procedures    Medications Ordered in ED Medications  albuterol (PROVENTIL) (2.5 MG/3ML) 0.083% nebulizer solution (10 mg/hr Nebulization New Bag/Given 06/01/22 0510)  nitroGLYCERIN (NITROSTAT) SL tablet 0.4 mg (0.4 mg Sublingual Given 06/01/22 0528)  ipratropium-albuterol (DUONEB) 0.5-2.5 (3) MG/3ML nebulizer solution 3 mL (3 mLs Nebulization Given 06/01/22 0318)  methylPREDNISolone sodium succinate (SOLU-MEDROL) 125 mg/2 mL injection 125 mg (125 mg Intravenous Given  06/01/22 0310)  magnesium sulfate IVPB 2 g 50 mL (0 g Intravenous Stopped 06/01/22 0434)  chlorpheniramine-HYDROcodone (TUSSIONEX) 10-8 MG/5ML suspension 5 mL (5 mLs Oral Given 06/01/22 0510)  amLODipine (NORVASC) tablet 10 mg (10 mg Oral Given 06/01/22 0981)    ED Course/ Medical Decision Making/ A&P                             Medical Decision Making Amount and/or Complexity of Data Reviewed Labs: ordered. Radiology: ordered.  Risk Prescription drug management.   This patient is a 52 y.o. female who presents to the ED for concern of shortness of breath, this involves an extensive  number of treatment options, and is a complaint that carries with it a high risk of complications and morbidity. The emergent differential diagnosis prior to evaluation includes, but is not limited to,  CHF, pericardial effusion/tamponade, arrhythmias, ACS, COPD, asthma, bronchitis, pneumonia, pneumothorax, PE, anemia  This is not an exhaustive differential.   Past Medical History / Co-morbidities / Social History: history of asthma, hypertension  Physical Exam: Physical exam performed. The pertinent findings include: diffuse wheezing and diminished lung sounds throughout  Lab Tests: I ordered, and personally interpreted labs.  The pertinent results include:  no leukocytosis or anemia, BNP WNL, troponin WNL, RVP pending   Imaging Studies: I ordered imaging studies including CXR. I independently visualized and interpreted imaging which showed NAD, low lung volumes. I agree with the radiologist interpretation.   Cardiac Monitoring:  The patient was maintained on a cardiac monitor.  My attending physician Dr. Madilyn Hook viewed and interpreted the cardiac monitored which showed an underlying rhythm of: sinus rhythm. I agree with this interpretation.   Medications: I ordered medication including amlodipine, tussionex, duoneb x 3, magnesium, and solumedrol  for cough, shortness of breath, hypertension. Reevaluation of  the patient after these medicines showed that the patient stayed the same. I have reviewed the patients home medicines and have made adjustments as needed.   Disposition:  Patient presents today with complaints of shortness of breath. Found to have diminished breath sounds and wheezing throughout. Patient denies any improvement with duonebs, steroids, and magnesium. Found to still be wheezing throughout. Started on continuous neb for same. Given her lack of improvement or significant history of similar symptoms previously did further evaluate with troponin and BNP both of which were WNL. Given this, suspect asthma exacerbation. Given lack of improvement with meds, will require admission. Discussed with patient who is understanding and in agreement.   Discussed patient with hospitalist who accepts patient for admission   I discussed this case with my attending physician Dr. Madilyn Hook who cosigned this note including patient's presenting symptoms, physical exam, and planned diagnostics and interventions. Attending physician stated agreement with plan or made changes to plan which were implemented.     Final Clinical Impression(s) / ED Diagnoses Final diagnoses:  Moderate persistent asthma with exacerbation    Rx / DC Orders ED Discharge Orders     None         Vear Clock 06/01/22 1610    Tilden Fossa, MD 06/02/22 [phone]

## 2022-06-01 NOTE — TOC Progression Note (Signed)
Transition of Care [facility]) - Progression Note    Patient Details  Name: Briana Wade MRN: [id number] Date of Birth: 1970/09/08  Transition of [facility]) CM/SW Contact  Beckie Busing, RN Phone Number:[phone]  06/01/2022, 3:13 PM  Clinical Narrative:     Transition of Care [facility]) Screening Note   Patient Details  Name: Briana Wade Date of Birth: October 14, 1970   Transition of Care [facility]) CM/SW Contact:    Beckie Busing, RN Phone Number: 06/01/2022, 3:14 PM    Transition of Care Department [facility]) has reviewed patient and no TOC needs have been identified at this time. We will continue to monitor patient advancement through interdisciplinary progression rounds. If new patient transition needs arise, please place a TOC consult.          Expected Discharge Plan and Services                                               Social Determinants of Health (SDOH) Interventions SDOH Screenings   Food Insecurity: No Food Insecurity (06/01/2022)  Housing: Low Risk  (06/01/2022)  Transportation Needs: No Transportation Needs (06/01/2022)  Utilities: Not At Risk (06/01/2022)  Depression (PHQ2-9): Medium Risk (11/28/2020)  Tobacco Use: Low Risk  (06/01/2022)    Readmission Risk Interventions     No data to display

## 2022-06-01 NOTE — Progress Notes (Signed)
[facility] admitting physician addendum:  Patient was seen again due to precordial chest pain.  She described it as a pressure.  Not radiated.  No nausea, emesis or diaphoresis.  EKG now showing new T wave inversion on anterolateral leads.  Cardiac risk factors are hypertension and class II obesity.  She denied cardiac family history or smoking.  To her knowledge, she does not have hyperlipidemia.  Chest pain has resolved and she feels better after she received the DuoNeb.  She has increased air movement and wheezing when compared to this morning.  CV examination remains S1-S2, RRR, no edema.  Repeat troponin was only 2 ng/L.  Repeat EKG still shows T wave inversion even when she is chest pain-free.  Aspirin 324 mg p.o. x 1 dose given.  She is on metoprolol 25 mg p.o. twice daily and amlodipine 10 mg p.o. daily.  Her blood pressure has normalized.  Echocardiogram ordered.  Cardiology consult requested for a.m., but I have asked cardiology to review her EKGs at this afternoon.  We will see if they have any new advice after their tracing review.  Sanda Klein, MD.

## 2022-06-01 NOTE — ED Triage Notes (Signed)
Pt. Arrives POV c/o SOB and cough. Pt. Has audible wheezing on inspiration. Pt. States that she tried using her inhaler but has had no relief.

## 2022-06-01 NOTE — Progress Notes (Signed)
1243:Patient admitted to 1615, vital signs stable, complaining of chest tightness and pain & EKG completed, tele monitor and prn meds ordered. EKG showed NSR & T wave abnormality-consider inferior ischemia and anterolateral ischemia MD Robb Matar notified.  1500:Patient says chest pain is minimal, repeat EKG completed, Cardiology consulted, PO aspirin administered. Nebulizer treatment given by respiratory therapist.

## 2022-06-02 ENCOUNTER — Other Ambulatory Visit: Payer: Self-pay

## 2022-06-02 ENCOUNTER — Inpatient Hospital Stay (HOSPITAL_COMMUNITY)
Admission: EM | Admit: 2022-06-02 | Discharge: 2022-06-03 | DRG: 203 | Discharge status: Against Medical Advice | Payer: BLUE CROSS/BLUE SHIELD | Attending: Internal Medicine | Admitting: Internal Medicine

## 2022-06-02 ENCOUNTER — Encounter (HOSPITAL_COMMUNITY): Payer: Self-pay

## 2022-06-02 ENCOUNTER — Observation Stay (HOSPITAL_COMMUNITY): Payer: BLUE CROSS/BLUE SHIELD

## 2022-06-02 ENCOUNTER — Other Ambulatory Visit (HOSPITAL_COMMUNITY): Payer: Self-pay

## 2022-06-02 ENCOUNTER — Other Ambulatory Visit (HOSPITAL_COMMUNITY): Payer: BLUE CROSS/BLUE SHIELD

## 2022-06-02 DIAGNOSIS — I10 Essential (primary) hypertension: Secondary | ICD-10-CM | POA: Diagnosis present

## 2022-06-02 DIAGNOSIS — J45909 Unspecified asthma, uncomplicated: Secondary | ICD-10-CM | POA: Diagnosis present

## 2022-06-02 DIAGNOSIS — Z8249 Family history of ischemic heart disease and other diseases of the circulatory system: Secondary | ICD-10-CM

## 2022-06-02 DIAGNOSIS — Z809 Family history of malignant neoplasm, unspecified: Secondary | ICD-10-CM

## 2022-06-02 DIAGNOSIS — T464X5A Adverse effect of angiotensin-converting-enzyme inhibitors, initial encounter: Secondary | ICD-10-CM | POA: Diagnosis present

## 2022-06-02 DIAGNOSIS — I2510 Atherosclerotic heart disease of native coronary artery without angina pectoris: Secondary | ICD-10-CM | POA: Diagnosis present

## 2022-06-02 DIAGNOSIS — J4542 Moderate persistent asthma with status asthmaticus: Principal | ICD-10-CM | POA: Diagnosis present

## 2022-06-02 DIAGNOSIS — Z825 Family history of asthma and other chronic lower respiratory diseases: Secondary | ICD-10-CM

## 2022-06-02 DIAGNOSIS — J441 Chronic obstructive pulmonary disease with (acute) exacerbation: Secondary | ICD-10-CM | POA: Diagnosis present

## 2022-06-02 DIAGNOSIS — J45901 Unspecified asthma with (acute) exacerbation: Secondary | ICD-10-CM | POA: Diagnosis present

## 2022-06-02 DIAGNOSIS — E669 Obesity, unspecified: Secondary | ICD-10-CM | POA: Diagnosis present

## 2022-06-02 DIAGNOSIS — Z6839 Body mass index (BMI) 39.0-39.9, adult: Secondary | ICD-10-CM

## 2022-06-02 DIAGNOSIS — K219 Gastro-esophageal reflux disease without esophagitis: Secondary | ICD-10-CM | POA: Diagnosis present

## 2022-06-02 DIAGNOSIS — Z1152 Encounter for screening for COVID-19: Secondary | ICD-10-CM

## 2022-06-02 DIAGNOSIS — Z5329 Procedure and treatment not carried out because of patient's decision for other reasons: Secondary | ICD-10-CM | POA: Diagnosis present

## 2022-06-02 DIAGNOSIS — Z79899 Other long term (current) drug therapy: Secondary | ICD-10-CM

## 2022-06-02 DIAGNOSIS — E739 Lactose intolerance, unspecified: Secondary | ICD-10-CM | POA: Diagnosis present

## 2022-06-02 LAB — CBC WITH DIFFERENTIAL/PLATELET
Abs Immature Granulocytes: 0.07 10*3/uL (ref 0.00–0.07)
Basophils Absolute: 0 10*3/uL (ref 0.0–0.1)
Basophils Relative: 0 %
Eosinophils Absolute: 0 10*3/uL (ref 0.0–0.5)
Eosinophils Relative: 0 %
HCT: 38.8 % (ref 36.0–46.0)
Hemoglobin: 12.1 g/dL (ref 12.0–15.0)
Immature Granulocytes: 1 %
Lymphocytes Relative: 11 %
Lymphs Abs: 1.2 10*3/uL (ref 0.7–4.0)
MCH: 25.9 pg — ABNORMAL LOW (ref 26.0–34.0)
MCHC: 31.2 g/dL (ref 30.0–36.0)
MCV: 82.9 fL (ref 80.0–100.0)
Monocytes Absolute: 0.9 10*3/uL (ref 0.1–1.0)
Monocytes Relative: 8 %
Neutro Abs: 8.9 10*3/uL — ABNORMAL HIGH (ref 1.7–7.7)
Neutrophils Relative %: 80 %
Platelets: 360 10*3/uL (ref 150–400)
RBC: 4.68 MIL/uL (ref 3.87–5.11)
RDW: 15.1 % (ref 11.5–15.5)
WBC: 11.1 10*3/uL — ABNORMAL HIGH (ref 4.0–10.5)
nRBC: 0 % (ref 0.0–0.2)

## 2022-06-02 LAB — BLOOD GAS, VENOUS
Acid-Base Excess: 10 mmol/L — ABNORMAL HIGH (ref 0.0–2.0)
Bicarbonate: 35 mmol/L — ABNORMAL HIGH (ref 20.0–28.0)
O2 Saturation: 88.3 %
Patient temperature: 37
pCO2, Ven: 47 mmHg (ref 44–60)
pH, Ven: 7.48 — ABNORMAL HIGH (ref 7.25–7.43)
pO2, Ven: 54 mmHg — ABNORMAL HIGH (ref 32–45)

## 2022-06-02 LAB — BASIC METABOLIC PANEL
Anion gap: 7 (ref 5–15)
BUN: 19 mg/dL (ref 6–20)
CO2: 27 mmol/L (ref 22–32)
Calcium: 8.9 mg/dL (ref 8.9–10.3)
Chloride: 102 mmol/L (ref 98–111)
Creatinine, Ser: 0.74 mg/dL (ref 0.44–1.00)
GFR, Estimated: >60 mL/min (ref >60)
Glucose, Bld: 109 mg/dL — ABNORMAL HIGH (ref 70–99)
Potassium: 3.9 mmol/L (ref 3.5–5.1)
Sodium: 136 mmol/L (ref 135–145)

## 2022-06-02 LAB — RESP PANEL BY RT-PCR (RSV, FLU A&B, COVID)  RVPGX2
Influenza A by PCR: NEGATIVE
Influenza B by PCR: NEGATIVE
Resp Syncytial Virus by PCR: NEGATIVE
SARS Coronavirus 2 by RT PCR: NEGATIVE

## 2022-06-02 LAB — HIV ANTIBODY (ROUTINE TESTING W REFLEX): HIV Screen 4th Generation wRfx: NONREACTIVE

## 2022-06-02 LAB — I-STAT BETA HCG BLOOD, ED (MC, WL, AP ONLY): I-stat hCG, quantitative: 5.3 m[IU]/mL — ABNORMAL HIGH (ref <5)

## 2022-06-02 LAB — TSH: TSH: 0.737 u[IU]/mL (ref 0.350–4.500)

## 2022-06-02 MED ORDER — METHYLPREDNISOLONE SODIUM SUCC 40 MG IJ SOLR
40.0000 mg | Freq: Two times a day (BID) | INTRAMUSCULAR | Status: AC
  Administered 2022-06-02 – 2022-06-03 (×2): 40 mg via INTRAVENOUS
  Filled 2022-06-02 (×2): qty 1

## 2022-06-02 MED ORDER — ACETAMINOPHEN 325 MG PO TABS
650.0000 mg | ORAL_TABLET | Freq: Four times a day (QID) | ORAL | Status: DC | PRN
  Administered 2022-06-03 (×2): 650 mg via ORAL
  Filled 2022-06-02 (×2): qty 2

## 2022-06-02 MED ORDER — ENOXAPARIN SODIUM 40 MG/0.4ML IJ SOSY
40.0000 mg | PREFILLED_SYRINGE | INTRAMUSCULAR | Status: DC
  Administered 2022-06-02 – 2022-06-03 (×2): 40 mg via SUBCUTANEOUS
  Filled 2022-06-02 (×2): qty 0.4

## 2022-06-02 MED ORDER — CARVEDILOL 3.125 MG PO TABS
3.1250 mg | ORAL_TABLET | Freq: Two times a day (BID) | ORAL | Status: DC
  Administered 2022-06-02 – 2022-06-03 (×3): 3.125 mg via ORAL
  Filled 2022-06-02 (×3): qty 1

## 2022-06-02 MED ORDER — BENZONATATE 100 MG PO CAPS
200.0000 mg | ORAL_CAPSULE | Freq: Three times a day (TID) | ORAL | Status: DC | PRN
  Administered 2022-06-02 – 2022-06-03 (×2): 200 mg via ORAL
  Filled 2022-06-02 (×2): qty 2

## 2022-06-02 MED ORDER — METHYLPREDNISOLONE SODIUM SUCC 125 MG IJ SOLR
125.0000 mg | Freq: Once | INTRAMUSCULAR | Status: AC
  Administered 2022-06-02: 125 mg via INTRAVENOUS
  Filled 2022-06-02: qty 2

## 2022-06-02 MED ORDER — FLUTICASONE FUROATE-VILANTEROL 100-25 MCG/ACT IN AEPB: 1.0000 | INHALATION_SPRAY | Freq: Every day | RESPIRATORY_TRACT | Status: DC

## 2022-06-02 MED ORDER — HYDRALAZINE HCL 20 MG/ML IJ SOLN
20.0000 mg | Freq: Once | INTRAMUSCULAR | Status: AC
  Administered 2022-06-02: 20 mg via INTRAVENOUS
  Filled 2022-06-02: qty 1

## 2022-06-02 MED ORDER — MAGNESIUM SULFATE 2 GM/50ML IV SOLN
2.0000 g | Freq: Once | INTRAVENOUS | Status: AC
  Administered 2022-06-02: 2 g via INTRAVENOUS
  Filled 2022-06-02: qty 50

## 2022-06-02 MED ORDER — ALBUTEROL SULFATE (2.5 MG/3ML) 0.083% IN NEBU
2.5000 mg | INHALATION_SOLUTION | RESPIRATORY_TRACT | Status: DC | PRN
  Administered 2022-06-02: 2.5 mg via RESPIRATORY_TRACT
  Filled 2022-06-02: qty 3

## 2022-06-02 MED ORDER — HYDRALAZINE HCL 20 MG/ML IJ SOLN: 10.0000 mg | Freq: Three times a day (TID) | INTRAMUSCULAR | Status: DC | PRN

## 2022-06-02 MED ORDER — IBUPROFEN 200 MG PO TABS: 600.0000 mg | ORAL_TABLET | Freq: Four times a day (QID) | ORAL | Status: DC | PRN

## 2022-06-02 MED ORDER — FUROSEMIDE 10 MG/ML IJ SOLN
40.0000 mg | Freq: Once | INTRAMUSCULAR | Status: AC
  Administered 2022-06-02: 40 mg via INTRAVENOUS
  Filled 2022-06-02: qty 4

## 2022-06-02 MED ORDER — PREDNISONE 20 MG PO TABS
40.0000 mg | ORAL_TABLET | Freq: Every day | ORAL | Status: DC
  Filled 2022-06-02: qty 2

## 2022-06-02 MED ORDER — MELATONIN 5 MG PO TABS
5.0000 mg | ORAL_TABLET | Freq: Once | ORAL | Status: AC
  Administered 2022-06-02: 5 mg via ORAL
  Filled 2022-06-02: qty 1

## 2022-06-02 MED ORDER — BUDESONIDE 0.5 MG/2ML IN SUSP
2.0000 mg | Freq: Two times a day (BID) | RESPIRATORY_TRACT | Status: DC
  Administered 2022-06-02: 2 mg via RESPIRATORY_TRACT
  Administered 2022-06-03: 0.5 mg via RESPIRATORY_TRACT
  Filled 2022-06-02 (×2): qty 8

## 2022-06-02 MED ORDER — AMLODIPINE BESYLATE 5 MG PO TABS
10.0000 mg | ORAL_TABLET | Freq: Every day | ORAL | Status: DC
  Administered 2022-06-02 – 2022-06-03 (×2): 10 mg via ORAL
  Filled 2022-06-02 (×2): qty 2

## 2022-06-02 MED ORDER — IPRATROPIUM-ALBUTEROL 0.5-2.5 (3) MG/3ML IN SOLN
3.0000 mL | Freq: Four times a day (QID) | RESPIRATORY_TRACT | Status: DC
  Administered 2022-06-02 – 2022-06-03 (×6): 3 mL via RESPIRATORY_TRACT
  Filled 2022-06-02 (×7): qty 3

## 2022-06-02 MED ORDER — LABETALOL HCL 5 MG/ML IV SOLN
20.0000 mg | Freq: Once | INTRAVENOUS | Status: AC
  Administered 2022-06-03: 20 mg via INTRAVENOUS
  Filled 2022-06-02: qty 4

## 2022-06-02 MED ORDER — LORAZEPAM 2 MG/ML IJ SOLN
0.5000 mg | Freq: Once | INTRAMUSCULAR | Status: AC
  Administered 2022-06-02: 0.5 mg via INTRAVENOUS
  Filled 2022-06-02: qty 1

## 2022-06-02 MED ORDER — ALBUTEROL SULFATE (2.5 MG/3ML) 0.083% IN NEBU
10.0000 mg/h | INHALATION_SOLUTION | RESPIRATORY_TRACT | Status: AC
  Administered 2022-06-02: 10 mg/h via RESPIRATORY_TRACT
  Filled 2022-06-02: qty 12

## 2022-06-02 MED ORDER — FLUTICASONE FUROATE-VILANTEROL 100-25 MCG/ACT IN AEPB
1.0000 | INHALATION_SPRAY | Freq: Every day | RESPIRATORY_TRACT | Status: DC
  Administered 2022-06-03: 1 via RESPIRATORY_TRACT
  Filled 2022-06-02: qty 28

## 2022-06-02 MED ORDER — ONDANSETRON HCL 4 MG/2ML IJ SOLN
4.0000 mg | Freq: Once | INTRAMUSCULAR | Status: AC
  Administered 2022-06-02: 4 mg via INTRAVENOUS
  Filled 2022-06-02: qty 2

## 2022-06-02 NOTE — ED Triage Notes (Addendum)
Patient was admitted upstairs for her asthma, but had to leave for a family emergency, went AMA. Got home and her shortness of breath worsened. Patient has not used her inhaler today. Wheezing and unable to speak in full sentences. Has a cough.

## 2022-06-02 NOTE — ED Provider Notes (Signed)
[place] EMERGENCY DEPARTMENT AT [facility] Provider Note   CSN: [id number] Arrival date & time: 06/02/22  1051     History  Chief Complaint  Patient presents with   Asthma    Briana Wade is a 52 y.o. female with longstanding history of asthma presenting back to emergency department with worsening shortness of breath.  The patient was seen yesterday in the emergency department and had an initial workup done, was admitted for asthma exacerbation.  She left this morning at 6 am AMA due to a family emergency.  She returns now with worsening shortness of breath.  She states she did not take her morning medications including her blood pressure medicines.  Yesterday in the ER the patient had a chest x-ray which was largely unremarkable.  She had blood work done.  She was given IV Solu-Medrol, IV magnesium, and nebulizers.  HPI     Home Medications Prior to Admission medications   Medication Sig Start Date End Date Taking? Authorizing Provider  acetaminophen (TYLENOL) 325 MG tablet Take 2 tablets (650 mg total) by mouth every 6 (six) hours as needed. 06/14/21   Sloan Leiter, DO  amLODipine (NORVASC) 5 MG tablet Take 1 tablet (5 mg total) by mouth daily. Patient not taking: Reported on 06/01/2022 08/05/20 12/03/20  Elvin So C, PA-C  amoxicillin (AMOXIL) 875 MG tablet Take 875 mg by mouth 2 (two) times daily. 04/02/22   [provider]  amoxicillin-clavulanate (AUGMENTIN) 875-125 MG tablet Take 1 tablet by mouth every 12 (twelve) hours. Patient not taking: Reported on 06/01/2022 09/28/21   Dartha Lodge, PA-C  benazepril (LOTENSIN) 10 MG tablet Take 10 mg by mouth daily.    [provider]  diphenhydrAMINE (BENADRYL) 25 MG tablet Take 1 tablet (25 mg total) by mouth every 6 (six) hours as needed. Patient not taking: Reported on 06/01/2022 08/08/20   Arby Barrette, MD  docusate sodium (COLACE) 100 MG capsule Take 1 capsule (100 mg total) by mouth daily as  needed for mild constipation. Patient not taking: Reported on 06/01/2022 09/28/21   Dartha Lodge, PA-C  ibuprofen (ADVIL) 600 MG tablet Take 1 tablet (600 mg total) by mouth every 6 (six) hours as needed. 06/14/21   Sloan Leiter, DO  metoCLOPramide (REGLAN) 10 MG tablet Take 1 tablet (10 mg total) by mouth every 6 (six) hours. Patient not taking: Reported on 09/07/2020 08/08/20   Arby Barrette, MD  metoprolol tartrate (LOPRESSOR) 25 MG tablet Take 25 mg by mouth 2 (two) times daily. 04/01/20   [provider]  ondansetron (ZOFRAN) 4 MG tablet Take 1 tablet (4 mg total) by mouth every 6 (six) hours. Patient not taking: Reported on 06/01/2022 09/28/21   Dartha Lodge, PA-C  oxyCODONE-acetaminophen (PERCOCET) 5-325 MG tablet Take 1 tablet by mouth every 6 (six) hours as needed. Patient not taking: Reported on 06/01/2022 09/28/21   Dartha Lodge, PA-C      Allergies    Lactose intolerance (gi)    Review of Systems   Review of Systems  Physical Exam Updated Vital Signs BP (!) 149/98   Pulse (!) 118   Temp 98.7 F (37.1 C) (Oral)   Resp (!) 21   Ht 5\' 1"  (1.549 m)   Wt 96 kg   SpO2 99%   BMI 39.99 kg/m  Physical Exam Constitutional:      General: She is not in acute distress.    Appearance: She is obese.  HENT:  Head: Normocephalic and atraumatic.  Eyes:     Conjunctiva/sclera: Conjunctivae normal.     Pupils: Pupils are equal, round, and reactive to light.  Cardiovascular:     Rate and Rhythm: Regular rhythm. Tachycardia present.  Pulmonary:     Effort: Pulmonary effort is normal. No respiratory distress.     Comments: Speaking short sentences, diffuse expiratory wheezing Abdominal:     General: There is no distension.     Tenderness: There is no abdominal tenderness.  Skin:    General: Skin is warm and dry.  Neurological:     General: No focal deficit present.     Mental Status: She is alert. Mental status is at baseline.  Psychiatric:        Mood and Affect: Mood  normal.        Behavior: Behavior normal.     ED Results / Procedures / Treatments   Labs (all labs ordered are listed, but only abnormal results are displayed) Labs Reviewed  BASIC METABOLIC PANEL - Abnormal; Notable for the following components:      Result Value   Glucose, Bld 109 (*)    All other components within normal limits  CBC WITH DIFFERENTIAL/PLATELET - Abnormal; Notable for the following components:   WBC 11.1 (*)    MCH 25.9 (*)    Neutro Abs 8.9 (*)    All other components within normal limits  I-STAT BETA HCG BLOOD, ED ([facility], [facility], [facility] ONLY) - Abnormal; Notable for the following components:   I-stat hCG, quantitative 5.3 (*)    All other components within normal limits  RESP PANEL BY RT-PCR (RSV, FLU A&B, COVID)  RVPGX2    EKG EKG Interpretation  Date/Time:  Tuesday Jun 02 2022 10:57:36 EDT Ventricular Rate:  114 PR Interval:  136 QRS Duration: 75 QT Interval:  328 QTC Calculation: 452 R Axis:   12 Text Interpretation: Sinus tachycardia Confirmed by Alvester Chou [phone]) on 06/02/2022 11:00:21 AM  Radiology DG Chest Portable 1 View  Result Date: 06/01/2022 CLINICAL DATA:  Shortness of breath. EXAM: PORTABLE CHEST 1 VIEW COMPARISON:  04/20/2022 FINDINGS: Lungs are suboptimally inflated. Heart size and mediastinal contours appear stable. No pleural fluid or airspace disease identified. Visualized osseous structures are unremarkable. IMPRESSION: Low lung volumes. No acute findings. Electronically Signed   By: Signa Kell M.D.   On: 06/01/2022 04:51    Procedures .Critical Care  Performed by: Terald Sleeper, MD Authorized by: Terald Sleeper, MD   Critical care provider statement:    Critical care time (minutes):  45   Critical care time was exclusive of:  Separately billable procedures and treating other patients   Critical care was necessary to treat or prevent imminent or life-threatening deterioration of the following conditions:  Respiratory  failure   Critical care was time spent personally by me on the following activities:  Ordering and performing treatments and interventions, ordering and review of laboratory studies, ordering and review of radiographic studies, pulse oximetry, review of old charts, examination of patient and evaluation of patient's response to treatment Comments:     Bipap for asthma exacerbation     Medications Ordered in ED Medications  albuterol (PROVENTIL) (2.5 MG/3ML) 0.083% nebulizer solution (10 mg/hr Nebulization New Bag/Given 06/02/22 1126)  magnesium sulfate IVPB 2 g 50 mL (0 g Intravenous Stopped 06/02/22 1327)  methylPREDNISolone sodium succinate (SOLU-MEDROL) 125 mg/2 mL injection 125 mg (125 mg Intravenous Given 06/02/22 1137)  hydrALAZINE (APRESOLINE) injection 20 mg (20 mg  Intravenous Given 06/02/22 1240)  ondansetron (ZOFRAN) injection 4 mg (4 mg Intravenous Given 06/02/22 1137)    ED Course/ Medical Decision Making/ A&P Clinical Course as of 06/02/22 1335  Tue Jun 02, 2022  1334 Admitted to dr Chipper Herb, patient has removed her bipap, remains stable and breathing improved now with treatments.  Hcg level suspected falsely elevated, not pregnant [MT]    Clinical Course User Index [MT] Costantino Kohlbeck, Kermit Balo, MD                             Medical Decision Making Amount and/or Complexity of Data Reviewed Labs: ordered.  Risk Prescription drug management. Decision regarding hospitalization.   This patient presents to the ED with concern for shortness of breath, wheezing. This involves an extensive number of treatment options, and is a complaint that carries with it a high risk of complications and morbidity.  The differential diagnosis includes continued asthma exacerbation most likely  Co-morbidities that complicate the patient evaluation: History of longstanding underlying asthma at high risk of exacerbation  External records from outside source obtained and reviewed including hospital discharge  summary from this morning  No indication at this time for emergent repeat x-ray imaging of the chest.  Low suspicion for developing bacterial pneumonia or pneumothorax.  I ordered and personally interpreted labs.  The pertinent results include:  labs stable from yesterday.  Covid/flu negative.  Hcg falsely positive, suspected.  The patient was maintained on a cardiac monitor.  I personally viewed and interpreted the cardiac monitored which showed an underlying rhythm of: Sinus tachycardia  Per my interpretation the patient's ECG shows sinus tachycardia  I ordered medication including continuous nebulizers, IV steroids, IV magnesium.  IV hydralazine ordered for blood pressure control given that the patient has on BiPAP now did not take oral medications  I have reviewed the patients home medicines and have made adjustments as needed   After the interventions noted above, I reevaluated the patient and found that they have: improved  Dispostion:  After consideration of the diagnostic results and the patients response to treatment, I feel that the patent would benefit from medical admission.         Final Clinical Impression(s) / ED Diagnoses Final diagnoses:  Exacerbation of asthma, unspecified asthma severity, unspecified whether persistent    Rx / DC Orders ED Discharge Orders     None         Terald Sleeper, MD 06/02/22 1335

## 2022-06-02 NOTE — Progress Notes (Signed)
Patient left AMA, AMA paper signed.

## 2022-06-02 NOTE — Discharge Summary (Signed)
Physician Discharge Summary   Patient: Briana Wade MRN: [id number] DOB: 07-Apr-1970  Admit date:     06/01/2022  Discharge date: 06/02/22  Discharge Physician: Bobette Mo   [facility]P: [facility]   Recommendations at discharge:    Left AMA before day shift. Work up and cardiology evaluation not done.  Discharge Diagnoses: Principal Problem:   Acute asthma exacerbation Active Problems:   GERD (gastroesophageal reflux disease)   HTN (hypertension)   Class II obesity   Hypocalcemia   Chest pain  Resolved Problems:   No resolved hospital problems.   Hospital Course: Chief Complaint  Patient presents with   Shortness of Breath    HPI: Briana Wade is a 52 y.o. female with medical history significant of unspecified abdominal pain, seasonal allergies, asthma, anemia, chlamydia, diverticulosis, diverticulitis, GERD, hiatal hernia, IBS, hypertension, insomnia lactose intolerant, migraine equivalent, trichomoniasis, tubular adenoma, umbilical hernia, uterine fibroid who presented with progressively worsening dyspnea, wheezing, pleuritic CP and nonproductive cough for several days with no relief from albuterol inhaler.  No exposure to fumes.  She is not a smoker.  She denied fever, chills, rhinorrhea, sore throat or hemoptysis.  No palpitations, diaphoresis, PND, orthopnea or pitting edema of the lower extremities.  No abdominal pain, nausea, emesis, diarrhea, constipation, melena or hematochezia.  No flank pain, dysuria, frequency or hematuria.  No polyuria, polydipsia, polyphagia or blurred vision.   She have an episode of CP with EKG changes while on the floor. ASA started and beta blockers continued. She signed AMA and did not wait for work up and cardiology evaluation.   ED course: Initial vital signs were temperature 98.5 F, pulse 85, respirations 22, BP 181/118 mmHg and O2 sat 100% on room air.  The patient received amlodipine 10 mg p.o. x 1, 5 mL of 2 CNX, 3  DuoNebs, magnesium sulfate 2 g IVPB, methylprednisolone 125 mg IVP and a Nitropaste patch.   Lab work: Her CBC, troponin and BNP were normal.  Negative coronavirus, influenza A/B and RSV PCR.  BMP was normal except for calcium of 8.9 mg/dL.   Imaging: Portable 1 view chest radiograph with low lung volumes and no acute findings.   Assessment and Plan: Signed AMA. Should follow up with PCP and cardiology as outpatient.  Consultants: Cardiology consult not done since the patient left AMA. Procedures performed:   Disposition: Home Diet recommendation:  Cardiac diet DISCHARGE MEDICATION: Allergies as of 06/02/2022       Reactions   Lactose Intolerance (gi)       Discharge Exam: Filed Weights   06/01/22 0216  Weight: 95.3 kg   Left AMA before re-evaluation.  Condition at discharge: fair  The results of significant diagnostics from this hospitalization (including imaging, microbiology, ancillary and laboratory) are listed below for reference.   Imaging Studies: DG Chest Portable 1 View  Result Date: 06/01/2022 CLINICAL DATA:  Shortness of breath. EXAM: PORTABLE CHEST 1 VIEW COMPARISON:  04/20/2022 FINDINGS: Lungs are suboptimally inflated. Heart size and mediastinal contours appear stable. No pleural fluid or airspace disease identified. Visualized osseous structures are unremarkable. IMPRESSION: Low lung volumes. No acute findings. Electronically Signed   By: Signa Kell M.D.   On: 06/01/2022 04:51    Microbiology: Results for orders placed or performed during the hospital encounter of 06/01/22  Resp panel by RT-PCR (RSV, Flu A&B, Covid) Anterior Nasal Swab     Status: None   Collection Time: 06/01/22  5:13 AM   Specimen: Anterior Nasal  Swab  Result Value Ref Range Status   SARS Coronavirus 2 by RT PCR NEGATIVE NEGATIVE Final    Comment: (NOTE) SARS-CoV-2 target nucleic acids are NOT DETECTED.  The SARS-CoV-2 RNA is generally detectable in upper respiratory specimens  during the acute phase of infection. The lowest concentration of SARS-CoV-2 viral copies this assay can detect is 138 copies/mL. A negative result does not preclude SARS-Cov-2 infection and should not be used as the sole basis for treatment or other patient management decisions. A negative result may occur with  improper specimen collection/handling, submission of specimen other than nasopharyngeal swab, presence of viral mutation(s) within the areas targeted by this assay, and inadequate number of viral copies(<138 copies/mL). A negative result must be combined with clinical observations, patient history, and epidemiological information. The expected result is Negative.  Fact Sheet for Patients:  BloggerCourse.com  Fact Sheet for Healthcare Providers:  SeriousBroker.it  This test is no t yet approved or cleared by the [place] FDA and  has been authorized for detection and/or diagnosis of SARS-CoV-2 by FDA under an Emergency Use Authorization (EUA). This EUA will remain  in effect (meaning this test can be used) for the duration of the COVID-19 declaration under Section 564(b)(1) of the Act, 21 U.S.C.section 360bbb-3(b)(1), unless the authorization is terminated  or revoked sooner.       Influenza A by PCR NEGATIVE NEGATIVE Final   Influenza B by PCR NEGATIVE NEGATIVE Final    Comment: (NOTE) The Xpert Xpress SARS-CoV-2/FLU/RSV plus assay is intended as an aid in the diagnosis of influenza from Nasopharyngeal swab specimens and should not be used as a sole basis for treatment. Nasal washings and aspirates are unacceptable for Xpert Xpress SARS-CoV-2/FLU/RSV testing.  Fact Sheet for Patients: BloggerCourse.com  Fact Sheet for Healthcare Providers: SeriousBroker.it  This test is not yet approved or cleared by the [place] FDA and has been authorized for detection  and/or diagnosis of SARS-CoV-2 by FDA under an Emergency Use Authorization (EUA). This EUA will remain in effect (meaning this test can be used) for the duration of the COVID-19 declaration under Section 564(b)(1) of the Act, 21 U.S.C. section 360bbb-3(b)(1), unless the authorization is terminated or revoked.     Resp Syncytial Virus by PCR NEGATIVE NEGATIVE Final    Comment: (NOTE) Fact Sheet for Patients: BloggerCourse.com  Fact Sheet for Healthcare Providers: SeriousBroker.it  This test is not yet approved or cleared by the [place] FDA and has been authorized for detection and/or diagnosis of SARS-CoV-2 by FDA under an Emergency Use Authorization (EUA). This EUA will remain in effect (meaning this test can be used) for the duration of the COVID-19 declaration under Section 564(b)(1) of the Act, 21 U.S.C. section 360bbb-3(b)(1), unless the authorization is terminated or revoked.  Performed at [facility], [address], [place], [place] [address]     Labs: CBC: Recent Labs  Lab 06/01/22 0329  WBC 7.3  HGB 12.0  HCT 38.7  MCV 84.1  PLT 322   Basic Metabolic Panel: Recent Labs  Lab 06/01/22 0329  NA 137  K 3.7  CL 102  CO2 28  GLUCOSE 99  BUN 13  CREATININE 0.92  CALCIUM 8.8*  PHOS 3.4   Liver Function Tests: Recent Labs  Lab 06/01/22 0329  AST 25  ALT 20  ALKPHOS 81  BILITOT 0.3  PROT 7.7  ALBUMIN 4.2   CBG: No results for input(s): "GLUCAP" in the last 168 hours.  Discharge time spent:  less than 30 minutes.  Signed: Bobette Mo, MD [facility] 06/02/2022  This document was prepared using Dragon voice recognition software and may contain some unintended transcription errors.

## 2022-06-02 NOTE — Progress Notes (Signed)
                                                  Against Medical Advice Patient at this time expresses desire to leave the Hospital immediately, patient has been warned that this is not Medically advisable at this time, and can result in Medical complications like Death and Disability, patient understands and accepts the risks involved and assumes full responsibilty of this decision.  This patient has also been advised that if they feel the need for further medical assistance to return to any available ER or dial 9-1-1.  Informed by Nursing staff that this patient has left care and has signed the form  Against Medical Advice on 06/02/2022 at 0402 hrs  Chinita Greenland BSN MSNA MSN ACNPC-AG Acute Care Nurse Practitioner [facility]

## 2022-06-02 NOTE — ED Notes (Signed)
Pt removed bipap and refusing to put it back on.

## 2022-06-02 NOTE — Progress Notes (Signed)
Patient left AMA, provider notified. Patient left ambulatory, both IV's were removed. AMA paper signed. Patient stated she understood the consequences of leaving AMA.

## 2022-06-02 NOTE — H&P (Addendum)
History and Physical    Briana Wade ZOX:[id number] DOB: [birth year] DOA: 06/02/2022  PCP: [facility] (Confirm with patient/family/NH records and if not entered, this has to be entered at [facility] point of entry) Patient coming from: Home  I have personally briefly reviewed patient's old medical records in [facility]  Chief Complaint: Cough, wheezing, shortness of breath  HPI: Briana Wade is a 52 y.o. female with medical history significant of mild intermittent asthma, HTN, obesity, presented with persistent cough wheezing shortness of breath  Patient only has a remote history of asthma, has had no flareup in recent years.  And she denied any significant allergens at home new pain to or detergent, or soap.  The only thing she could remember new is that, patient reported that about 2 weeks ago, her blood pressure medications were changed: Amlodipine was discontinued and she was started on benazepril.  And about 7 days ago she started to have severe dry cough and gradual onset of wheezing, tightness but denies any URI symptoms no runny nose no postnasal drip denies any fever or chills. "  Could not breathe for 1 day and could not sleep at night at all" she first came to ED last night to [facility], was diagnosed with asthma exacerbation the plan was for her to stay overnight for breathing treatment and IV steroids however patient left AMA for home emergency.  Overnight her symptoms became worse and decided to come back for the second time.  She denies any rash, no lip or tongue swelling no difficulty swallowing.  Denies any drug use.  Patient was found in persistent asthma exacerbation was placed on BiPAP.  Review of Systems: As per HPI otherwise 14 point review of systems negative.    Past Medical History:  Diagnosis Date   Abdominal pain, unspecified site    Allergy    Anemia    Asthma    Chlamydia    Diverticulitis    Esophageal reflux    Hiatal hernia     Hypertension    IBS (irritable bowel syndrome)    Insomnia    Lactose intolerance    Migraine equivalent 08/2014   minor right arm weakness in [facility]. neg CVA on imaging.    Other constipation    Personal history of colonic polyps 09/12/2010   tubular adenoma   Trichimoniasis    Tubular adenoma of colon    Umbilical hernia    Uterine fibroid    Uterine leiomyoma     Past Surgical History:  Procedure Laterality Date   APPENDECTOMY     COLONOSCOPY     DILATION AND CURETTAGE OF UTERUS     x2 for EAB   HYSTEROSCOPY WITH NOVASURE N/A 12/17/2015   Procedure: HYSTEROSCOPY WITH NOVASURE;  Surgeon: Zapata Bing, MD;  Location: [facility];  Service: Gynecology;  Laterality: N/A;   TUBAL LIGATION       reports that she has never smoked. She has never used smokeless tobacco. She reports that she does not drink alcohol and does not use drugs.  Allergies  Allergen Reactions   Lisinopril Cough   Lactose Intolerance (Gi)     Family History  Problem Relation Age of Onset   Hypertension Mother    Cancer Mother        not sure of type   Asthma Cousin    Asthma Son    Colon cancer Neg Hx    Esophageal cancer Neg Hx    Rectal cancer  Neg Hx    Stomach cancer Neg Hx      Prior to Admission medications   Medication Sig Start Date End Date Taking? Authorizing Provider  acetaminophen (TYLENOL) 325 MG tablet Take 2 tablets (650 mg total) by mouth every 6 (six) hours as needed. 06/14/21   Sloan Leiter, DO  amLODipine (NORVASC) 5 MG tablet Take 1 tablet (5 mg total) by mouth daily. Patient not taking: Reported on 06/01/2022 08/05/20 12/03/20  Elvin So C, PA-C  amoxicillin (AMOXIL) 875 MG tablet Take 875 mg by mouth 2 (two) times daily. 04/02/22   [provider]  amoxicillin-clavulanate (AUGMENTIN) 875-125 MG tablet Take 1 tablet by mouth every 12 (twelve) hours. Patient not taking: Reported on 06/01/2022 09/28/21   Dartha Lodge, PA-C  benazepril (LOTENSIN) 10 MG tablet Take 10 mg  by mouth daily.    [provider]  diphenhydrAMINE (BENADRYL) 25 MG tablet Take 1 tablet (25 mg total) by mouth every 6 (six) hours as needed. Patient not taking: Reported on 06/01/2022 08/08/20   Arby Barrette, MD  docusate sodium (COLACE) 100 MG capsule Take 1 capsule (100 mg total) by mouth daily as needed for mild constipation. Patient not taking: Reported on 06/01/2022 09/28/21   Dartha Lodge, PA-C  ibuprofen (ADVIL) 600 MG tablet Take 1 tablet (600 mg total) by mouth every 6 (six) hours as needed. 06/14/21   Sloan Leiter, DO  metoCLOPramide (REGLAN) 10 MG tablet Take 1 tablet (10 mg total) by mouth every 6 (six) hours. Patient not taking: Reported on 09/07/2020 08/08/20   Arby Barrette, MD  metoprolol tartrate (LOPRESSOR) 25 MG tablet Take 25 mg by mouth 2 (two) times daily. 04/01/20   [provider]  ondansetron (ZOFRAN) 4 MG tablet Take 1 tablet (4 mg total) by mouth every 6 (six) hours. Patient not taking: Reported on 06/01/2022 09/28/21   Dartha Lodge, PA-C  oxyCODONE-acetaminophen (PERCOCET) 5-325 MG tablet Take 1 tablet by mouth every 6 (six) hours as needed. Patient not taking: Reported on 06/01/2022 09/28/21   Dartha Lodge, [place]    Physical Exam: Vitals:   06/02/22 1300 06/02/22 1315 06/02/22 1320 06/02/22 1330  BP: (!) 167/101 123/85 (!) 149/98 (!) 157/100  Pulse: (!) 120 (!) 122 (!) 118 (!) 112  Resp: (!) 23 (!) 21 (!) 21 (!) 23  Temp:    98.9 F (37.2 C)  TempSrc:    Oral  SpO2: 98% 100% 99% 99%  Weight:      Height:        Constitutional: NAD, calm, comfortable Vitals:   06/02/22 1300 06/02/22 1315 06/02/22 1320 06/02/22 1330  BP: (!) 167/101 123/85 (!) 149/98 (!) 157/100  Pulse: (!) 120 (!) 122 (!) 118 (!) 112  Resp: (!) 23 (!) 21 (!) 21 (!) 23  Temp:    98.9 F (37.2 C)  TempSrc:    Oral  SpO2: 98% 100% 99% 99%  Weight:      Height:       Eyes: PERRL, lids and conjunctivae normal ENMT: Mucous membranes are moist. Posterior pharynx clear of  any exudate or lesions.Normal dentition.  Neck: normal, supple, no masses, no thyromegaly Respiratory: Diminished breathing sound bilaterally, diffused wheezing, scattered crackles bilaterally, increasing breathing effort, and talking in broken sentences. No accessory muscle use.  Cardiovascular: Regular rate and rhythm, no murmurs / rubs / gallops. No extremity edema. 2+ pedal pulses. No carotid bruits.  Abdomen: no tenderness, no masses palpated. No hepatosplenomegaly. Bowel  sounds positive.  Musculoskeletal: no clubbing / cyanosis. No joint deformity upper and lower extremities. Good ROM, no contractures. Normal muscle tone.  Skin: no rashes, lesions, ulcers. No induration Neurologic: CN 2-12 grossly intact. Sensation intact, DTR normal. Strength 5/5 in all 4.  Psychiatric: Normal judgment and insight. Alert and oriented x 3. Normal mood.     Labs on Admission: I have personally reviewed following labs and imaging studies  CBC: Recent Labs  Lab 06/01/22 0329 06/02/22 1130  WBC 7.3 11.1*  NEUTROABS  --  8.9*  HGB 12.0 12.1  HCT 38.7 38.8  MCV 84.1 82.9  PLT 322 360   Basic Metabolic Panel: Recent Labs  Lab 06/01/22 0329 06/02/22 1130  NA 137 136  K 3.7 3.9  CL 102 102  CO2 28 27  GLUCOSE 99 109*  BUN 13 19  CREATININE 0.92 0.74  CALCIUM 8.8* 8.9  PHOS 3.4  --    GFR: Estimated Creatinine Clearance: 87.1 mL/min (by C-G formula based on SCr of 0.74 mg/dL). Liver Function Tests: Recent Labs  Lab 06/01/22 0329  AST 25  ALT 20  ALKPHOS 81  BILITOT 0.3  PROT 7.7  ALBUMIN 4.2   No results for input(s): "LIPASE", "AMYLASE" in the last 168 hours. No results for input(s): "AMMONIA" in the last 168 hours. Coagulation Profile: No results for input(s): "INR", "PROTIME" in the last 168 hours. Cardiac Enzymes: No results for input(s): "CKTOTAL", "CKMB", "CKMBINDEX", "TROPONINI" in the last 168 hours. BNP (last 3 results) No results for input(s): "PROBNP" in the last  8760 hours. HbA1C: No results for input(s): "HGBA1C" in the last 72 hours. CBG: No results for input(s): "GLUCAP" in the last 168 hours. Lipid Profile: No results for input(s): "CHOL", "HDL", "LDLCALC", "TRIG", "CHOLHDL", "LDLDIRECT" in the last 72 hours. Thyroid Function Tests: No results for input(s): "TSH", "T4TOTAL", "FREET4", "T3FREE", "THYROIDAB" in the last 72 hours. Anemia Panel: No results for input(s): "VITAMINB12", "FOLATE", "FERRITIN", "TIBC", "IRON", "RETICCTPCT" in the last 72 hours. Urine analysis:    Component Value Date/Time   COLORURINE YELLOW 09/28/2021 0837   APPEARANCEUR CLEAR 09/28/2021 0837   LABSPEC 1.014 09/28/2021 0837   PHURINE 7.0 09/28/2021 0837   GLUCOSEU NEGATIVE 09/28/2021 0837   HGBUR NEGATIVE 09/28/2021 0837   BILIRUBINUR NEGATIVE 09/28/2021 0837   KETONESUR NEGATIVE 09/28/2021 0837   PROTEINUR NEGATIVE 09/28/2021 0837   UROBILINOGEN 0.2 08/27/2014 1348   NITRITE NEGATIVE 09/28/2021 0837   LEUKOCYTESUR NEGATIVE 09/28/2021 0837    Radiological Exams on Admission: DG Chest Portable 1 View  Result Date: 06/01/2022 CLINICAL DATA:  Shortness of breath. EXAM: PORTABLE CHEST 1 VIEW COMPARISON:  04/20/2022 FINDINGS: Lungs are suboptimally inflated. Heart size and mediastinal contours appear stable. No pleural fluid or airspace disease identified. Visualized osseous structures are unremarkable. IMPRESSION: Low lung volumes. No acute findings. Electronically Signed   By: Signa Kell M.D.   On: 06/01/2022 04:51    EKG: Independently reviewed.  Sinus tachycardia, no acute ST changes.  Assessment/Plan Principal Problem:   Asthma Active Problems:   HTN (hypertension)   Acute asthma exacerbation   Class II obesity  (please populate well all problems here in Problem List. (For example, if patient is on BP meds at home and you resume or decide to hold them, it is a problem that needs to be her. Same for CAD, COPD, HLD and so on)  Status  asthmaticus -Plan to continue BiPAP until her respiratory distress symptoms improve -Admit to PCU for close monitoring and  BiPAP support -Etiology of her asthma attack likely related to lisinopril, I have listed lisinopril as a new allergy in her chart.  Avoid ACEI and probably ARB as well in the future.  Educated patient who expressed understanding and agreed. -Continue IV Solu-Medrol 2 doses then switch to p.o. prednisone if symptoms stabilized -Continue ICS, DuoNebs and as needed albuterol -Peak flow -VBG to rule out CO2 retention -Other Ddx, blood pressure significantly elevated, chest x-ray showed mild pulmonary vascular congestion, 1 dose of IV Lasix given.  Echocardiogram ordered.  Repeat chest x-ray tomorrow  ACEI allergy -No symptoms or signs of angioedema -Discontinue benazepril, and breathing symptoms management as above  HTN, uncontrolled -Increased amlodipine to 10 mg daily, switch metoprolol to Coreg for better BP control -Hydralazine as needed  Obesity -BMI= 39, outpatient PCP follow-up  DVT prophylaxis: Lovenox Code Status: Full code Family Communication: None at bedside Disposition Plan: Expect less than 2 midnight hospital stay Consults called: None Admission status: PCU   Emeline General MD [facility] Pager [phone] 06/02/2022, 2:25 PM

## 2022-06-03 ENCOUNTER — Observation Stay (HOSPITAL_COMMUNITY): Payer: BLUE CROSS/BLUE SHIELD

## 2022-06-03 DIAGNOSIS — J4542 Moderate persistent asthma with status asthmaticus: Secondary | ICD-10-CM | POA: Diagnosis present

## 2022-06-03 DIAGNOSIS — I10 Essential (primary) hypertension: Secondary | ICD-10-CM | POA: Diagnosis present

## 2022-06-03 DIAGNOSIS — K219 Gastro-esophageal reflux disease without esophagitis: Secondary | ICD-10-CM | POA: Diagnosis present

## 2022-06-03 DIAGNOSIS — J441 Chronic obstructive pulmonary disease with (acute) exacerbation: Secondary | ICD-10-CM | POA: Diagnosis present

## 2022-06-03 DIAGNOSIS — E739 Lactose intolerance, unspecified: Secondary | ICD-10-CM | POA: Diagnosis present

## 2022-06-03 DIAGNOSIS — Z825 Family history of asthma and other chronic lower respiratory diseases: Secondary | ICD-10-CM | POA: Diagnosis not present

## 2022-06-03 DIAGNOSIS — J45901 Unspecified asthma with (acute) exacerbation: Secondary | ICD-10-CM | POA: Diagnosis present

## 2022-06-03 DIAGNOSIS — I2510 Atherosclerotic heart disease of native coronary artery without angina pectoris: Secondary | ICD-10-CM | POA: Diagnosis present

## 2022-06-03 DIAGNOSIS — Z1152 Encounter for screening for COVID-19: Secondary | ICD-10-CM | POA: Diagnosis not present

## 2022-06-03 DIAGNOSIS — T464X5A Adverse effect of angiotensin-converting-enzyme inhibitors, initial encounter: Secondary | ICD-10-CM | POA: Diagnosis present

## 2022-06-03 DIAGNOSIS — J45909 Unspecified asthma, uncomplicated: Secondary | ICD-10-CM

## 2022-06-03 DIAGNOSIS — Z6839 Body mass index (BMI) 39.0-39.9, adult: Secondary | ICD-10-CM | POA: Diagnosis not present

## 2022-06-03 DIAGNOSIS — Z5329 Procedure and treatment not carried out because of patient's decision for other reasons: Secondary | ICD-10-CM | POA: Diagnosis present

## 2022-06-03 DIAGNOSIS — E669 Obesity, unspecified: Secondary | ICD-10-CM | POA: Diagnosis present

## 2022-06-03 DIAGNOSIS — Z809 Family history of malignant neoplasm, unspecified: Secondary | ICD-10-CM | POA: Diagnosis not present

## 2022-06-03 DIAGNOSIS — Z8249 Family history of ischemic heart disease and other diseases of the circulatory system: Secondary | ICD-10-CM | POA: Diagnosis not present

## 2022-06-03 DIAGNOSIS — Z79899 Other long term (current) drug therapy: Secondary | ICD-10-CM | POA: Diagnosis not present

## 2022-06-03 LAB — BASIC METABOLIC PANEL
Anion gap: 9 (ref 5–15)
BUN: 20 mg/dL (ref 6–20)
CO2: 28 mmol/L (ref 22–32)
Calcium: 8.9 mg/dL (ref 8.9–10.3)
Chloride: 98 mmol/L (ref 98–111)
Creatinine, Ser: 0.85 mg/dL (ref 0.44–1.00)
GFR, Estimated: >60 mL/min (ref >60)
Glucose, Bld: 129 mg/dL — ABNORMAL HIGH (ref 70–99)
Potassium: 4.3 mmol/L (ref 3.5–5.1)
Sodium: 135 mmol/L (ref 135–145)

## 2022-06-03 MED ORDER — HYDRALAZINE HCL 20 MG/ML IJ SOLN: 10.0000 mg | Freq: Three times a day (TID) | INTRAMUSCULAR | Status: DC | PRN

## 2022-06-03 MED ORDER — IPRATROPIUM-ALBUTEROL 0.5-2.5 (3) MG/3ML IN SOLN: 3.0000 mL | Freq: Three times a day (TID) | RESPIRATORY_TRACT | Status: DC

## 2022-06-03 MED ORDER — METHYLPREDNISOLONE SODIUM SUCC 40 MG IJ SOLR: 40.0000 mg | Freq: Two times a day (BID) | INTRAMUSCULAR | Status: DC

## 2022-06-03 MED ORDER — GUAIFENESIN-DM 100-10 MG/5ML PO SYRP
5.0000 mL | ORAL_SOLUTION | ORAL | Status: DC | PRN
  Administered 2022-06-03: 5 mL via ORAL
  Filled 2022-06-03: qty 10

## 2022-06-03 NOTE — TOC CM/SW Note (Signed)
  Transition of Care [facility]) Screening Note   Patient Details  Name: Briana Wade Date of Birth: [birth year]   Transition of Care [facility]) CM/SW Contact:    Howell Rucks, RN Phone Number: 06/03/2022, 8:57 AM    Transition of Care Department [facility]) has reviewed patient and no TOC needs have been identified at this time. We will continue to monitor patient advancement through interdisciplinary progression rounds. If new patient transition needs arise, please place a TOC consult.

## 2022-06-03 NOTE — Progress Notes (Signed)
    Against Medical Advice   Briana Wade expresses desire to leave the Hospital immediately. Patient has been warned that this is not medically advisable at this time, and can result in medical complications like Death and Disability. Patient understands and accepts the risks involved and assumes full responsibilty of this decision.  This patient has also been advised that if they feel the need for further medical assistance to return to any available ER or dial 9-1-1.  Informed by Nursing staff that this patient has left care and has signed the form  Against Medical Advice on 06/03/2022 at 2015.    Anthoney Harada, DNP, ACNPC- AG [facility]

## 2022-06-03 NOTE — Progress Notes (Signed)
[facility] Progress Note  Patient: Briana Wade     ZOX:[id number]  DOA: 06/02/2022   PCP: [facility]       Brief hospital course: This is a 52 year old female with asthma who is not on any medication currently, hypertension and obesity who presents to the hospital for cough wheezing and shortness of breath.  She did mention that about 2 weeks ago she was started on benazepril and subsequently be about a week later she began to have a cough and then progressive wheezing and tightness in her chest. In ED: Respiratory rate in the 20s, tachycardic with extensive wheezing She required a BiPAP in the ED.   Subjective:  Still has shortness of breath and cough today Assessment and Plan: Principal Problem: Acute hypoxic respiratory failure - Question if ACE inhibitor is related or if this is a distinct asthma exacerbation - Remains tachypneic, has bilateral wheezing and a pulse ox of 89% on room air today - Would like to continue steroids and nebulizer treatments every 6 hours and as needed - Will attempt to wean oxygen as able  Active Problems:   HTN (hypertension) -Amlodipine being continued - Carvedilol was started in the hospital in place of lisinopril and metoprolol    Class II obesity Body mass index is 39.99 kg/m.      Code Status: Full Code Consultants: None Level of Care: Level of care: Progressive Total time on patient care: 35 minutes DVT prophylaxis:  enoxaparin (LOVENOX) injection 40 mg Start: 06/02/22 1700     Objective:   Vitals:   06/03/22 0538 06/03/22 0845 06/03/22 1140 06/03/22 1353  BP: 133/77  (!) 164/102 (!) 159/105  Pulse: 78  81 85  Resp: 20  20 (!) 21  Temp: 98.3 F (36.8 C)  98 F (36.7 C) (!) 97.5 F (36.4 C)  TempSrc: Oral  Oral Oral  SpO2: 94% 98% 95% 95%  Weight:      Height:       Filed Weights   06/02/22 1055  Weight: 96 kg   Exam: General exam: Appears comfortable  HEENT: oral mucosa moist Respiratory  system: Bilateral wheezing, tachypnea, mild cough Cardiovascular system: S1 & S2 heard, tachycardia Gastrointestinal system: Abdomen soft, non-tender, nondistended. Normal bowel sounds   Extremities: No cyanosis, clubbing or edema Psychiatry:  Mood & affect appropriate.      CBC: Recent Labs  Lab 06/01/22 0329 06/02/22 1130  WBC 7.3 11.1*  NEUTROABS  --  8.9*  HGB 12.0 12.1  HCT 38.7 38.8  MCV 84.1 82.9  PLT 322 360   Basic Metabolic Panel: Recent Labs  Lab 06/01/22 0329 06/02/22 1130 06/03/22 0513  NA 137 136 135  K 3.7 3.9 4.3  CL 102 102 98  CO2 28 27 28   GLUCOSE 99 109* 129*  BUN 13 19 20   CREATININE 0.92 0.74 0.85  CALCIUM 8.8* 8.9 8.9  PHOS 3.4  --   --    GFR: Estimated Creatinine Clearance: 82 mL/min (by C-G formula based on SCr of 0.85 mg/dL).  Scheduled Meds:  amLODipine  10 mg Oral Daily   carvedilol  3.125 mg Oral BID WC   enoxaparin (LOVENOX) injection  40 mg Subcutaneous Q24H   fluticasone furoate-vilanterol  1 puff Inhalation Daily   ipratropium-albuterol  3 mL Nebulization Q6H   [START ON 06/04/2022] predniSONE  40 mg Oral Q breakfast   Continuous Infusions: Imaging and lab data was personally reviewed DG Chest 1 View  Result Date: 06/03/2022  CLINICAL DATA:  CHF. EXAM: CHEST  1 VIEW COMPARISON:  06/02/2022 FINDINGS: Heart size and mediastinal contours are unremarkable. Lung volumes are low. No pleural fluid, interstitial edema or airspace disease. IMPRESSION: Low lung volumes. No acute findings. Electronically Signed   By: Signa Kell M.D.   On: 06/03/2022 08:03   DG Chest 1 View  Result Date: 06/02/2022 CLINICAL DATA:  Congestive heart failure EXAM: CHEST  1 VIEW COMPARISON:  X-ray 06/01/2022 and older FINDINGS: Underinflated but improved from prior. Borderline size heart. Improved vascular congestion. No pneumothorax, effusion or consolidation. Overlapping cardiac leads IMPRESSION: Improved inflation.  Enlarged heart.  Improving vascular  markings. Electronically Signed   By: Karen Kays M.D.   On: 06/02/2022 17:57    LOS: 0 days   Author: Calvert Cantor  06/03/2022 3:38 PM  To contact [facility]>   Check the care team in [facility] and look for the attending/consulting [facility] provider listed  Log into [email or web address] and use [place]'s universal password   Go to> "[facility]"  and find provider  If you still have difficulty reaching the provider, please page the [facility] (Director on Call) for the Hospitalists listed on amion

## 2022-06-06 ENCOUNTER — Other Ambulatory Visit: Payer: Self-pay

## 2022-06-06 ENCOUNTER — Encounter (HOSPITAL_COMMUNITY): Payer: Self-pay

## 2022-06-06 ENCOUNTER — Emergency Department (HOSPITAL_COMMUNITY): Payer: BLUE CROSS/BLUE SHIELD

## 2022-06-06 ENCOUNTER — Observation Stay (HOSPITAL_COMMUNITY)
Admission: EM | Admit: 2022-06-06 | Discharge: 2022-06-06 | Discharge status: Against Medical Advice | Payer: BLUE CROSS/BLUE SHIELD | Attending: Internal Medicine | Admitting: Internal Medicine

## 2022-06-06 DIAGNOSIS — J45901 Unspecified asthma with (acute) exacerbation: Secondary | ICD-10-CM | POA: Diagnosis present

## 2022-06-06 DIAGNOSIS — J4551 Severe persistent asthma with (acute) exacerbation: Secondary | ICD-10-CM | POA: Diagnosis not present

## 2022-06-06 DIAGNOSIS — I10 Essential (primary) hypertension: Secondary | ICD-10-CM | POA: Diagnosis not present

## 2022-06-06 DIAGNOSIS — R0602 Shortness of breath: Secondary | ICD-10-CM | POA: Diagnosis present

## 2022-06-06 DIAGNOSIS — Z79899 Other long term (current) drug therapy: Secondary | ICD-10-CM | POA: Insufficient documentation

## 2022-06-06 DIAGNOSIS — K219 Gastro-esophageal reflux disease without esophagitis: Secondary | ICD-10-CM | POA: Diagnosis present

## 2022-06-06 DIAGNOSIS — J4531 Mild persistent asthma with (acute) exacerbation: Secondary | ICD-10-CM | POA: Diagnosis not present

## 2022-06-06 LAB — BASIC METABOLIC PANEL
Anion gap: 7 (ref 5–15)
BUN: 12 mg/dL (ref 6–20)
CO2: 26 mmol/L (ref 22–32)
Calcium: 8.7 mg/dL — ABNORMAL LOW (ref 8.9–10.3)
Chloride: 100 mmol/L (ref 98–111)
Creatinine, Ser: 0.8 mg/dL (ref 0.44–1.00)
GFR, Estimated: >60 mL/min (ref >60)
Glucose, Bld: 119 mg/dL — ABNORMAL HIGH (ref 70–99)
Potassium: 3.7 mmol/L (ref 3.5–5.1)
Sodium: 133 mmol/L — ABNORMAL LOW (ref 135–145)

## 2022-06-06 LAB — TROPONIN I (HIGH SENSITIVITY)
Troponin I (High Sensitivity): 3 ng/L (ref <18)
Troponin I (High Sensitivity): 2 ng/L (ref <18)

## 2022-06-06 LAB — CBC
HCT: 43.3 % (ref 36.0–46.0)
HCT: 43.6 % (ref 36.0–46.0)
Hemoglobin: 13.6 g/dL (ref 12.0–15.0)
Hemoglobin: 13.5 g/dL (ref 12.0–15.0)
MCH: 26.1 pg (ref 26.0–34.0)
MCH: 25.7 pg — ABNORMAL LOW (ref 26.0–34.0)
MCHC: 31.4 g/dL (ref 30.0–36.0)
MCHC: 31 g/dL (ref 30.0–36.0)
MCV: 83 fL (ref 80.0–100.0)
MCV: 82.9 fL (ref 80.0–100.0)
Platelets: 336 10*3/uL (ref 150–400)
Platelets: 324 10*3/uL (ref 150–400)
RBC: 5.22 MIL/uL — ABNORMAL HIGH (ref 3.87–5.11)
RBC: 5.26 MIL/uL — ABNORMAL HIGH (ref 3.87–5.11)
RDW: 14.9 % (ref 11.5–15.5)
RDW: 14.9 % (ref 11.5–15.5)
WBC: 9.5 10*3/uL (ref 4.0–10.5)
WBC: 9.4 10*3/uL (ref 4.0–10.5)
nRBC: 0 % (ref 0.0–0.2)
nRBC: 0 % (ref 0.0–0.2)

## 2022-06-06 LAB — CREATININE, SERUM
Creatinine, Ser: 0.69 mg/dL (ref 0.44–1.00)
GFR, Estimated: >60 mL/min (ref >60)

## 2022-06-06 MED ORDER — TRAZODONE HCL 50 MG PO TABS
25.0000 mg | ORAL_TABLET | Freq: Every evening | ORAL | Status: DC | PRN
  Filled 2022-06-06: qty 1

## 2022-06-06 MED ORDER — BENAZEPRIL HCL 5 MG PO TABS
10.0000 mg | ORAL_TABLET | Freq: Every day | ORAL | Status: DC
  Filled 2022-06-06: qty 2

## 2022-06-06 MED ORDER — LORAZEPAM 1 MG PO TABS
1.0000 mg | ORAL_TABLET | Freq: Two times a day (BID) | ORAL | Status: DC | PRN
  Administered 2022-06-06: 1 mg via ORAL
  Filled 2022-06-06 (×2): qty 1

## 2022-06-06 MED ORDER — IPRATROPIUM-ALBUTEROL 0.5-2.5 (3) MG/3ML IN SOLN
3.0000 mL | Freq: Once | RESPIRATORY_TRACT | Status: AC
  Administered 2022-06-06: 3 mL via RESPIRATORY_TRACT
  Filled 2022-06-06: qty 3

## 2022-06-06 MED ORDER — HYDRALAZINE HCL 20 MG/ML IJ SOLN: 5.0000 mg | Freq: Four times a day (QID) | INTRAMUSCULAR | Status: DC | PRN

## 2022-06-06 MED ORDER — METOPROLOL TARTRATE 25 MG PO TABS
25.0000 mg | ORAL_TABLET | Freq: Two times a day (BID) | ORAL | Status: DC
  Filled 2022-06-06: qty 1

## 2022-06-06 MED ORDER — METHYLPREDNISOLONE SODIUM SUCC 125 MG IJ SOLR
125.0000 mg | INTRAMUSCULAR | Status: AC
  Administered 2022-06-06: 125 mg via INTRAVENOUS
  Filled 2022-06-06: qty 2

## 2022-06-06 MED ORDER — ONDANSETRON HCL 4 MG PO TABS: 4.0000 mg | ORAL_TABLET | Freq: Four times a day (QID) | ORAL | Status: DC | PRN

## 2022-06-06 MED ORDER — ACETAMINOPHEN 325 MG PO TABS
650.0000 mg | ORAL_TABLET | Freq: Four times a day (QID) | ORAL | Status: DC | PRN
  Filled 2022-06-06: qty 2

## 2022-06-06 MED ORDER — MAGNESIUM SULFATE 2 GM/50ML IV SOLN
2.0000 g | Freq: Once | INTRAVENOUS | Status: AC
  Administered 2022-06-06: 2 g via INTRAVENOUS
  Filled 2022-06-06: qty 50

## 2022-06-06 MED ORDER — METHYLPREDNISOLONE SODIUM SUCC 40 MG IJ SOLR
40.0000 mg | Freq: Two times a day (BID) | INTRAMUSCULAR | Status: DC
  Administered 2022-06-06: 40 mg via INTRAVENOUS
  Filled 2022-06-06 (×2): qty 1

## 2022-06-06 MED ORDER — ALBUTEROL SULFATE (2.5 MG/3ML) 0.083% IN NEBU
10.0000 mg/h | INHALATION_SOLUTION | Freq: Once | RESPIRATORY_TRACT | Status: AC
  Administered 2022-06-06: 10 mg/h via RESPIRATORY_TRACT
  Filled 2022-06-06: qty 12

## 2022-06-06 MED ORDER — ALBUTEROL SULFATE (2.5 MG/3ML) 0.083% IN NEBU: 2.5000 mg | INHALATION_SOLUTION | RESPIRATORY_TRACT | Status: DC | PRN

## 2022-06-06 MED ORDER — ACETAMINOPHEN 650 MG RE SUPP: 650.0000 mg | Freq: Four times a day (QID) | RECTAL | Status: DC | PRN

## 2022-06-06 MED ORDER — ONDANSETRON HCL 4 MG/2ML IJ SOLN: 4.0000 mg | Freq: Four times a day (QID) | INTRAMUSCULAR | Status: DC | PRN

## 2022-06-06 MED ORDER — ENOXAPARIN SODIUM 40 MG/0.4ML IJ SOSY
40.0000 mg | PREFILLED_SYRINGE | Freq: Every day | INTRAMUSCULAR | Status: DC
  Filled 2022-06-06: qty 0.4
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
## 2022-06-06 NOTE — ED Provider Notes (Signed)
[facility]ERGENC[facility] DEPARTMENT AT [facility] Provider Note   CSN: [id number] Arrival date & time: 06/06/22  6962     History  Chief Complaint  Patient presents with   Shortness of Breath    Briana Wade is a 52 y.o. female with medical history of allergies, anemia, diverticulitis, hypertension, asthma.  The patient presents to ED for evaluation of shortness of breath, wheezing and cough.  Patient reports that she was recently admitted on 5/7 however left AGAINST MEDICAL ADVICE on 5/8.  The patient was also admitted on 5/6 however left AGAINST MEDICAL ADVICE later that evening.  The patient returns today complaining of shortness of breath, cough and wheezing.  Patient states that her symptoms been ongoing since last hospital admission where she left AMA on 5/8.  The patient states she has been using her albuterol inhaler but now she is out of it.  She denies any fevers, chest pain, nausea, vomiting, abdominal pain, leg swelling, syncope, lightheadedness or dizziness. She states she had a fleeting episode of chest tightness this morning that lasted for 4 minutes. The patient is audibly wheezing on examination.   Shortness of Breath Associated symptoms: chest pain, cough and wheezing   Associated symptoms: no fever and no vomiting        Home Medications Prior to Admission medications   Medication Sig Start Date End Date Taking? Authorizing Provider  acetaminophen (TYLENOL) 325 MG tablet Take 2 tablets (650 mg total) by mouth every 6 (six) hours as needed. 06/14/21   Sloan Leiter, DO  amLODipine (NORVASC) 5 MG tablet Take 1 tablet (5 mg total) by mouth daily. Patient not taking: Reported on 06/01/2022 08/05/20 12/03/20  Elvin So C, PA-C  amoxicillin (AMOXIL) 875 MG tablet Take 875 mg by mouth 2 (two) times daily. 04/02/22   [provider]  amoxicillin-clavulanate (AUGMENTIN) 875-125 MG tablet Take 1 tablet by mouth every 12 (twelve) hours. Patient not  taking: Reported on 06/01/2022 09/28/21   Dartha Lodge, PA-C  benazepril (LOTENSIN) 10 MG tablet Take 10 mg by mouth daily.    [provider]  diphenhydrAMINE (BENADRYL) 25 MG tablet Take 1 tablet (25 mg total) by mouth every 6 (six) hours as needed. Patient not taking: Reported on 06/01/2022 08/08/20   Arby Barrette, MD  docusate sodium (COLACE) 100 MG capsule Take 1 capsule (100 mg total) by mouth daily as needed for mild constipation. Patient not taking: Reported on 06/01/2022 09/28/21   Dartha Lodge, PA-C  ibuprofen (ADVIL) 600 MG tablet Take 1 tablet (600 mg total) by mouth every 6 (six) hours as needed. 06/14/21   Sloan Leiter, DO  metoCLOPramide (REGLAN) 10 MG tablet Take 1 tablet (10 mg total) by mouth every 6 (six) hours. Patient not taking: Reported on 09/07/2020 08/08/20   Arby Barrette, MD  metoprolol tartrate (LOPRESSOR) 25 MG tablet Take 25 mg by mouth 2 (two) times daily. 04/01/20   [provider]  ondansetron (ZOFRAN) 4 MG tablet Take 1 tablet (4 mg total) by mouth every 6 (six) hours. Patient not taking: Reported on 06/01/2022 09/28/21   Dartha Lodge, PA-C  oxyCODONE-acetaminophen (PERCOCET) 5-325 MG tablet Take 1 tablet by mouth every 6 (six) hours as needed. Patient not taking: Reported on 06/01/2022 09/28/21   Dartha Lodge, PA-C      Allergies    Lisinopril and Lactose intolerance (gi)    Review of Systems   Review of Systems  Constitutional:  Negative for  fever.  Respiratory:  Positive for cough, chest tightness, shortness of breath and wheezing.   Cardiovascular:  Positive for chest pain. Negative for leg swelling.  Gastrointestinal:  Negative for nausea and vomiting.  Neurological:  Negative for dizziness, syncope and light-headedness.  All other systems reviewed and are negative.   Physical Exam Updated Vital Signs BP (!) 159/102   Pulse 79   Temp 98 F (36.7 C) (Oral)   Resp 18   Wt 96 kg   SpO2 100%   BMI 39.99 kg/m  Physical Exam Vitals  and nursing note reviewed.  Constitutional:      General: She is not in acute distress.    Appearance: Normal appearance. She is not ill-appearing, toxic-appearing or diaphoretic.  HENT:     Head: Normocephalic and atraumatic.     Nose: Nose normal.     Mouth/Throat:     Mouth: Mucous membranes are moist.     Pharynx: Oropharynx is clear.  Eyes:     Extraocular Movements: Extraocular movements intact.     Conjunctiva/sclera: Conjunctivae normal.     Pupils: Pupils are equal, round, and reactive to light.  Cardiovascular:     Rate and Rhythm: Normal rate and regular rhythm.  Pulmonary:     Breath sounds: Wheezing present.     Comments: Diffuse wheezing throughout Abdominal:     General: Abdomen is flat. Bowel sounds are normal.     Palpations: Abdomen is soft.     Tenderness: There is no abdominal tenderness.  Musculoskeletal:     Cervical back: Normal range of motion and neck supple.  Skin:    General: Skin is warm and dry.     Capillary Refill: Capillary refill takes less than 2 seconds.  Neurological:     Mental Status: She is alert and oriented to person, place, and time.     ED Results / Procedures / Treatments   Labs (all labs ordered are listed, but only abnormal results are displayed) Labs Reviewed  CBC - Abnormal; Notable for the following components:      Result Value   RBC 5.22 (*)    All other components within normal limits  BASIC METABOLIC PANEL - Abnormal; Notable for the following components:   Sodium 133 (*)    Glucose, Bld 119 (*)    Calcium 8.7 (*)    All other components within normal limits  CBC  CREATININE, SERUM  TROPONIN I (HIGH SENSITIVITY)  TROPONIN I (HIGH SENSITIVITY)    EKG EKG Interpretation  Date/Time:  Saturday Jun 06 2022 08:41:48 EDT Ventricular Rate:  98 PR Interval:  145 QRS Duration: 69 QT Interval:  356 QTC Calculation: 455 R Axis:   -16 Text Interpretation: Sinus rhythm Consider left ventricular hypertrophy Confirmed  by Kristine Royal [phone]) on 06/06/2022 10:20:50 AM  Radiology DG Chest 2 View  Result Date: 06/06/2022 CLINICAL DATA:  Dyspnea, asthma EXAM: CHEST - 2 VIEW COMPARISON:  06/03/2022 FINDINGS: Lungs volumes are small, but are symmetric and are clear. No pneumothorax or pleural effusion. Cardiac size within normal limits. Pulmonary vascularity is normal. Osseous structures are age-appropriate. No acute bone abnormality. IMPRESSION: 1. Pulmonary hypoinflation. Electronically Signed   By: Helyn Numbers M.D.   On: 06/06/2022 09:21    Procedures .Critical Care  Performed by: Al Decant, PA-C Authorized by: Al Decant, PA-C   Critical care provider statement:    Critical care time (minutes):  75   Critical care time was exclusive of:  Separately  billable procedures and treating other patients and teaching time   Critical care was necessary to treat or prevent imminent or life-threatening deterioration of the following conditions:  Respiratory failure   Critical care was time spent personally by me on the following activities:  Blood draw for specimens, ordering and performing treatments and interventions, ordering and review of laboratory studies, development of treatment plan with patient or surrogate, discussions with consultants, discussions with primary provider, evaluation of patient's response to treatment, examination of patient, interpretation of cardiac output measurements, obtaining history from patient or surrogate, vascular access procedures, review of old charts, re-evaluation of patient's condition, pulse oximetry and ordering and review of radiographic studies   I assumed direction of critical care for this patient from another provider in my specialty: no     Care discussed with: admitting provider      Medications Ordered in ED Medications  enoxaparin (LOVENOX) injection 40 mg (has no administration in time range)  acetaminophen (TYLENOL) tablet 650 mg (has no  administration in time range)    Or  acetaminophen (TYLENOL) suppository 650 mg (has no administration in time range)  traZODone (DESYREL) tablet 25 mg (has no administration in time range)  ondansetron (ZOFRAN) tablet 4 mg (has no administration in time range)    Or  ondansetron (ZOFRAN) injection 4 mg (has no administration in time range)  albuterol (PROVENTIL) (2.5 MG/3ML) 0.083% nebulizer solution 2.5 mg (has no administration in time range)  methylPREDNISolone sodium succinate (SOLU-MEDROL) 40 mg/mL injection 40 mg (has no administration in time range)  ipratropium-albuterol (DUONEB) 0.5-2.5 (3) MG/3ML nebulizer solution 3 mL (3 mLs Nebulization Given 06/06/22 0916)  methylPREDNISolone sodium succinate (SOLU-MEDROL) 125 mg/2 mL injection 125 mg (125 mg Intravenous Given 06/06/22 0916)  magnesium sulfate IVPB 2 g 50 mL (0 g Intravenous Stopped 06/06/22 1034)  albuterol (PROVENTIL) (2.5 MG/3ML) 0.083% nebulizer solution (10 mg/hr Nebulization Given 06/06/22 0953)  albuterol (PROVENTIL) (2.5 MG/3ML) 0.083% nebulizer solution (10 mg/hr Nebulization Given 06/06/22 1109)    ED Course/ Medical Decision Making/ A&P  Medical Decision Making Amount and/or Complexity of Data Reviewed Labs: ordered. Radiology: ordered.  Risk Prescription drug management.   52 year old female presents to the ED for evaluation.  Please see HPI for further details.  On examination the patient is audibly wheezing.  She is not hypoxic on the monitor with an oxygen saturation of 96%.  When the patient is ambulated she maintains her oxygen saturation.  She is afebrile and nontachycardic.  Her abdomen is soft and compressible throughout.  Neurological examination at baseline.  Patient initially given duo nebulizer, 2 g mag sulfate, 125 Solu-Medrol.  Patient lab work we will collect will include CBC, BMP, chest x-ray, troponin.  CBC without leukocytosis or anemia.  BMP with sodium 133, no other electrolyte derangement,  no elevated creatinine.  Patient troponin 3, delta 2.  EKG is nonischemic.  Her chest x-ray shows hypoinflation.  After initial DuoNeb, patient wheezing had not resolved.  Patient given continuous duo nebulizer at 10 mg/h for 1 hour.  On reassessment, patient continues to wheeze.  Will give 1 more DuoNeb continuous and then call for admission.  On reassessment, patient continuing to wheeze.  Discussed with the patient that at this time I would like to admit her.  I discussed that in the past she has been admitted however always leaves AGAINST MEDICAL ADVICE.  I stressed the importance of the patient staying until workup and treatment is complete.  She voiced understanding, acknowledging that "I  need to get this figured out now".  Patient case discussed with admitting Dr. Kirby Crigler.  Patient will be admitted.  Final Clinical Impression(s) / ED Diagnoses Final diagnoses:  Severe persistent asthma with exacerbation    Rx / DC Orders ED Discharge Orders     None         Clent Ridges 06/06/22 1249    Wynetta Fines, MD 06/07/22 [phone]

## 2022-06-06 NOTE — ED Notes (Signed)
ED TO INPATIENT HANDOFF REPORT  ED Nurse Name and Phone #: Arnetha Gula Name/Age/Gender Briana Wade 52 y.o. female Room/Bed: WA10/WA10  Code Status   Code Status: Full Code  Home/SNF/Other Home Patient oriented to: self, place, time, and situation Is this baseline? Yes   Triage Complete: Triage complete  Chief Complaint Asthma exacerbation [J45.901]  Triage Note C/o SOB, cough, and wheezing at rest Audible wheezing noted in triage.  Hx asthma.  Recently admitted and left AMA on 06/03/2022   Allergies Allergies  Allergen Reactions   Lisinopril Cough   Lactose Intolerance (Gi)     Level of Care/Admitting Diagnosis ED Disposition     ED Disposition  Admit   Condition  --   Comment  Hospital Area: [facility] [[id number]]  Level of Care: Med-Surg [16]  May place patient in observation at [facility] or [facility] if equivalent level of care is available:: Yes  Covid Evaluation: Asymptomatic - no recent exposure (last 10 days) testing not required  Diagnosis: Asthma exacerbation [697512]  Admitting Physician: Maryln Gottron [[id number]]  Attending Physician: Kirby Crigler, Parks Neptune [[id number]]          B Medical/Surgery History Past Medical History:  Diagnosis Date   Abdominal pain, unspecified site    Allergy    Anemia    Asthma    Chlamydia    Diverticulitis    Esophageal reflux    Hiatal hernia    Hypertension    IBS (irritable bowel syndrome)    Insomnia    Lactose intolerance    Migraine equivalent 08/2014   minor right arm weakness in [facility]. neg CVA on imaging.    Other constipation    Personal history of colonic polyps 09/12/2010   tubular adenoma   Trichimoniasis    Tubular adenoma of colon    Umbilical hernia    Uterine fibroid    Uterine leiomyoma    Past Surgical History:  Procedure Laterality Date   APPENDECTOMY     COLONOSCOPY     DILATION AND CURETTAGE OF UTERUS     x2 for EAB   HYSTEROSCOPY WITH NOVASURE  N/A 12/17/2015   Procedure: HYSTEROSCOPY WITH NOVASURE;  Surgeon: Oberlin Bing, MD;  Location: [facility];  Service: Gynecology;  Laterality: N/A;   TUBAL LIGATION       A IV Location/Drains/Wounds Patient Lines/Drains/Airways Status     Active Line/Drains/Airways     Name Placement date Placement time Site Days   Peripheral IV 06/06/22 20 G Anterior;Right Forearm 06/06/22  0931  Forearm  less than 1            Intake/Output Last 24 hours No intake or output data in the 24 hours ending 06/06/22 1259  Labs/Imaging Results for orders placed or performed during the hospital encounter of 06/06/22 (from the past 48 hour(s))  CBC     Status: Abnormal   Collection Time: 06/06/22  9:31 AM  Result Value Ref Range   WBC 9.5 4.0 - 10.5 K/uL   RBC 5.22 (H) 3.87 - 5.11 MIL/uL   Hemoglobin 13.6 12.0 - 15.0 g/dL   HCT 62.1 30.8 - 65.7 %   MCV 83.0 80.0 - 100.0 fL   MCH 26.1 26.0 - 34.0 pg   MCHC 31.4 30.0 - 36.0 g/dL   RDW 84.6 96.2 - 95.2 %   Platelets 336 150 - 400 K/uL   nRBC 0.0 0.0 - 0.2 %    Comment: Performed at [facility], [address] [place], [place] [address]  Basic metabolic panel     Status: Abnormal   Collection Time: 06/06/22  9:31 AM  Result Value Ref Range   Sodium 133 (L) 135 - 145 mmol/L   Potassium 3.7 3.5 - 5.1 mmol/L   Chloride 100 98 - 111 mmol/L   CO2 26 22 - 32 mmol/L   Glucose, Bld 119 (H) 70 - 99 mg/dL    Comment: Glucose reference range applies only to samples taken after fasting for at least 8 hours.   BUN 12 6 - 20 mg/dL   Creatinine, Ser 6.04 0.44 - 1.00 mg/dL   Calcium 8.7 (L) 8.9 - 10.3 mg/dL   GFR, Estimated >54 >09 mL/min    Comment: (NOTE) Calculated using the CKD-EPI Creatinine Equation (2021)    Anion gap 7 5 - 15    Comment: Performed at [facility], [address], [place], [place] [address]  Troponin I (High Sensitivity)     Status: None   Collection Time: 06/06/22  9:31 AM  Result Value  Ref Range   Troponin I (High Sensitivity) 3 <18 ng/L    Comment: (NOTE) Elevated high sensitivity troponin I (hsTnI) values and significant  changes across serial measurements may suggest ACS but many other  chronic and acute conditions are known to elevate hsTnI results.  Refer to the "Links" section for chest pain algorithms and additional  guidance. Performed at [facility], [address] [place], [place] [address]   Troponin I (High Sensitivity)     Status: None   Collection Time: 06/06/22 11:34 AM  Result Value Ref Range   Troponin I (High Sensitivity) 2 <18 ng/L    Comment: (NOTE) Elevated high sensitivity troponin I (hsTnI) values and significant  changes across serial measurements may suggest ACS but many other  chronic and acute conditions are known to elevate hsTnI results.  Refer to the "Links" section for chest pain algorithms and additional  guidance. Performed at [facility], [address], [place], [place] [address]    DG Chest 2 View  Result Date: 06/06/2022 CLINICAL DATA:  Dyspnea, asthma EXAM: CHEST - 2 VIEW COMPARISON:  06/03/2022 FINDINGS: Lungs volumes are small, but are symmetric and are clear. No pneumothorax or pleural effusion. Cardiac size within normal limits. Pulmonary vascularity is normal. Osseous structures are age-appropriate. No acute bone abnormality. IMPRESSION: 1. Pulmonary hypoinflation. Electronically Signed   By: Helyn Numbers M.D.   On: 06/06/2022 09:21    Pending Labs Unresulted Labs (From admission, onward)     Start     Ordered   06/13/22 0500  Creatinine, serum  (enoxaparin (LOVENOX)    CrCl >/= 30 ml/min)  Weekly,   R     Comments: while on enoxaparin therapy    06/06/22 1238   06/06/22 1238  CBC  (enoxaparin (LOVENOX)    CrCl >/= 30 ml/min)  Once,   R       Comments: Baseline for enoxaparin therapy IF NOT ALREADY DRAWN.  Notify MD if PLT < 100 K.    06/06/22 1238   06/06/22 1238   Creatinine, serum  (enoxaparin (LOVENOX)    CrCl >/= 30 ml/min)  Once,   R       Comments: Baseline for enoxaparin therapy IF NOT ALREADY DRAWN.    06/06/22 1238            Vitals/Pain Today's Vitals   06/06/22 0865  06/06/22 0854 06/06/22 1000 06/06/22 1240  BP: (!) 148/102  (!) 159/102   Pulse: (!) 102  79   Resp: 18  18   Temp: 98.2 F (36.8 C)   98 F (36.7 C)  TempSrc: Oral   Oral  SpO2: 96%  100%   Weight:  96 kg    PainSc:  0-No pain      Isolation Precautions No active isolations  Medications Medications  enoxaparin (LOVENOX) injection 40 mg (has no administration in time range)  acetaminophen (TYLENOL) tablet 650 mg (has no administration in time range)    Or  acetaminophen (TYLENOL) suppository 650 mg (has no administration in time range)  traZODone (DESYREL) tablet 25 mg (has no administration in time range)  ondansetron (ZOFRAN) tablet 4 mg (has no administration in time range)    Or  ondansetron (ZOFRAN) injection 4 mg (has no administration in time range)  albuterol (PROVENTIL) (2.5 MG/3ML) 0.083% nebulizer solution 2.5 mg (has no administration in time range)  methylPREDNISolone sodium succinate (SOLU-MEDROL) 40 mg/mL injection 40 mg (has no administration in time range)  ipratropium-albuterol (DUONEB) 0.5-2.5 (3) MG/3ML nebulizer solution 3 mL (3 mLs Nebulization Given 06/06/22 0916)  methylPREDNISolone sodium succinate (SOLU-MEDROL) 125 mg/2 mL injection 125 mg (125 mg Intravenous Given 06/06/22 0916)  magnesium sulfate IVPB 2 g 50 mL (0 g Intravenous Stopped 06/06/22 1034)  albuterol (PROVENTIL) (2.5 MG/3ML) 0.083% nebulizer solution (10 mg/hr Nebulization Given 06/06/22 0953)  albuterol (PROVENTIL) (2.5 MG/3ML) 0.083% nebulizer solution (10 mg/hr Nebulization Given 06/06/22 1109)    Mobility walks     Focused Assessments Cardiac Assessment Handoff:    Lab Results  Component Value Date   CKTOTAL 142 03/05/2020   No results found for:  "DDIMER" Does the Patient currently have chest pain? No    R Recommendations: See Admitting Provider Note  Report given to:   Additional Notes:
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
## 2022-06-06 NOTE — H&P (Signed)
History and Physical  Briana Wade NFA:[id number] DOB: 10/27/1970 DOA: 06/06/2022  PCP: [facility]   Chief Complaint: Cough, shortness of breath  HPI: Briana Wade is a 52 y.o. female with medical history significant for asthma not on home medications, hypertension, diverticulosis, environmental allergies resenting to the emergency department with complaints of cough and shortness of breath is admitted to the hospital with asthma exacerbation.  Notably, the patient recently was admitted on 5/6 for cough and shortness of breath and left AMA later that day. She was again admitted on 5/7 and again left AMA on 5/8.  She returns today complaining of shortness of breath, cough, was using albuterol inhaler but she is out of that.  She denies any chest pain, fevers, nausea, vomiting.  Evaluation in the emergency department reveals hypertension, borderline tachycardia.  Lab work was pursued, and is relatively unremarkable.  She received IV steroids,, DuoNeb, IV magnesium, 2 hour-long albuterol nebs, after which her wheezing and rhonchi currently improved.  At the time of admission to the floor, patient admits that she was still rhonchorous and wheezing, but at the moment that seems to have resolved and she is feeling better.  She tells me that she has been having a lot of difficulty and stress in her relationship with her husband, and that is triggering her asthma exacerbations.  While she has left AMA a couple of times this month, she tells me that she is willing to stay the night and make sure that her breathing is stable.  Review of Systems: Please see HPI for pertinent positives and negatives. A complete 10 system review of systems are otherwise negative.  Past Medical History:  Diagnosis Date   Abdominal pain, unspecified site    Allergy    Anemia    Asthma    Chlamydia    Diverticulitis    Esophageal reflux    Hiatal hernia    Hypertension    IBS (irritable bowel syndrome)     Insomnia    Lactose intolerance    Migraine equivalent 08/2014   minor right arm weakness in [facility]. neg CVA on imaging.    Other constipation    Personal history of colonic polyps 09/12/2010   tubular adenoma   Trichimoniasis    Tubular adenoma of colon    Umbilical hernia    Uterine fibroid    Uterine leiomyoma    Past Surgical History:  Procedure Laterality Date   APPENDECTOMY     COLONOSCOPY     DILATION AND CURETTAGE OF UTERUS     x2 for EAB   HYSTEROSCOPY WITH NOVASURE N/A 12/17/2015   Procedure: HYSTEROSCOPY WITH NOVASURE;  Surgeon: Olney Bing, MD;  Location: [facility];  Service: Gynecology;  Laterality: N/A;   TUBAL LIGATION      Social History:  reports that she has never smoked. She has never used smokeless tobacco. She reports that she does not drink alcohol and does not use drugs.   Allergies  Allergen Reactions   Lisinopril Cough   Lactose Intolerance (Gi)     Family History  Problem Relation Age of Onset   Hypertension Mother    Cancer Mother        not sure of type   Asthma Cousin    Asthma Son    Colon cancer Neg Hx    Esophageal cancer Neg Hx    Rectal cancer Neg Hx    Stomach cancer Neg Hx      Prior to Admission  medications   Medication Sig Start Date End Date Taking? Authorizing Provider  acetaminophen (TYLENOL) 325 MG tablet Take 2 tablets (650 mg total) by mouth every 6 (six) hours as needed. 06/14/21  Yes Tanda Rockers A, DO  benazepril (LOTENSIN) 10 MG tablet Take 10 mg by mouth daily.   Yes [provider]  ibuprofen (ADVIL) 600 MG tablet Take 1 tablet (600 mg total) by mouth every 6 (six) hours as needed. 06/14/21  Yes Tanda Rockers A, DO  metoprolol tartrate (LOPRESSOR) 25 MG tablet Take 25 mg by mouth 2 (two) times daily. 04/01/20  Yes [provider]  amLODipine (NORVASC) 5 MG tablet Take 1 tablet (5 mg total) by mouth daily. Patient not taking: Reported on 06/01/2022 08/05/20 12/03/20  Elvin So C, PA-C  amoxicillin  (AMOXIL) 875 MG tablet Take 875 mg by mouth 2 (two) times daily. Patient not taking: Reported on 06/06/2022 04/02/22   [provider]  amoxicillin-clavulanate (AUGMENTIN) 875-125 MG tablet Take 1 tablet by mouth every 12 (twelve) hours. Patient not taking: Reported on 06/01/2022 09/28/21   Dartha Lodge, PA-C  diphenhydrAMINE (BENADRYL) 25 MG tablet Take 1 tablet (25 mg total) by mouth every 6 (six) hours as needed. Patient not taking: Reported on 06/01/2022 08/08/20   Arby Barrette, MD  docusate sodium (COLACE) 100 MG capsule Take 1 capsule (100 mg total) by mouth daily as needed for mild constipation. Patient not taking: Reported on 06/01/2022 09/28/21   Dartha Lodge, PA-C  metoCLOPramide (REGLAN) 10 MG tablet Take 1 tablet (10 mg total) by mouth every 6 (six) hours. Patient not taking: Reported on 09/07/2020 08/08/20   Arby Barrette, MD  ondansetron (ZOFRAN) 4 MG tablet Take 1 tablet (4 mg total) by mouth every 6 (six) hours. Patient not taking: Reported on 06/01/2022 09/28/21   Dartha Lodge, PA-C  oxyCODONE-acetaminophen (PERCOCET) 5-325 MG tablet Take 1 tablet by mouth every 6 (six) hours as needed. Patient not taking: Reported on 06/01/2022 09/28/21   Dartha Lodge, PA-C    Physical Exam: BP (!) 134/90 (BP Location: Left Arm)   Pulse (!) 103   Temp 98.5 F (36.9 C) (Oral)   Resp 20   Ht 5\' 1"  (1.549 m)   Wt 93.7 kg   SpO2 96%   BMI 39.03 kg/m   General:  Alert, oriented, calm, in no acute distress, speaking full sentences with no cough, or evidence of shortness of breath or respiratory distress. Eyes: EOMI, clear conjuctivae, [place] sclerea Neck: supple, no masses, trachea mildline  Cardiovascular: RRR, no murmurs or rubs, no peripheral edema  Respiratory: clear to auscultation bilaterally, no wheezes, no crackles  Abdomen: soft, nontender, nondistended, normal bowel tones heard  Skin: dry, no rashes  Musculoskeletal: no joint effusions, normal range of motion  Psychiatric:  appropriate affect, normal speech  Neurologic: extraocular muscles intact, clear speech, moving all extremities with intact sensorium          Labs on Admission:  Basic Metabolic Panel: Recent Labs  Lab 06/01/22 0329 06/02/22 1130 06/03/22 0513 06/06/22 0931 06/06/22 1308  NA 137 136 135 133*  --   K 3.7 3.9 4.3 3.7  --   CL 102 102 98 100  --   CO2 28 27 28 26   --   GLUCOSE 99 109* 129* 119*  --   BUN 13 19 20 12   --   CREATININE 0.92 0.74 0.85 0.80 0.69  CALCIUM 8.8* 8.9 8.9 8.7*  --   PHOS  3.4  --   --   --   --    Liver Function Tests: Recent Labs  Lab 06/01/22 0329  AST 25  ALT 20  ALKPHOS 81  BILITOT 0.3  PROT 7.7  ALBUMIN 4.2   No results for input(s): "LIPASE", "AMYLASE" in the last 168 hours. No results for input(s): "AMMONIA" in the last 168 hours. CBC: Recent Labs  Lab 06/01/22 0329 06/02/22 1130 06/06/22 0931 06/06/22 1308  WBC 7.3 11.1* 9.5 9.4  NEUTROABS  --  8.9*  --   --   HGB 12.0 12.1 13.6 13.5  HCT 38.7 38.8 43.3 43.6  MCV 84.1 82.9 83.0 82.9  PLT 322 360 336 324   Cardiac Enzymes: No results for input(s): "CKTOTAL", "CKMB", "CKMBINDEX", "TROPONINI" in the last 168 hours.  BNP (last 3 results) Recent Labs    06/01/22 0329  BNP 16.4    ProBNP (last 3 results) No results for input(s): "PROBNP" in the last 8760 hours.  CBG: No results for input(s): "GLUCAP" in the last 168 hours.  Radiological Exams on Admission: DG Chest 2 View  Result Date: 06/06/2022 CLINICAL DATA:  Dyspnea, asthma EXAM: CHEST - 2 VIEW COMPARISON:  06/03/2022 FINDINGS: Lungs volumes are small, but are symmetric and are clear. No pneumothorax or pleural effusion. Cardiac size within normal limits. Pulmonary vascularity is normal. Osseous structures are age-appropriate. No acute bone abnormality. IMPRESSION: 1. Pulmonary hypoinflation. Electronically Signed   By: Helyn Numbers M.D.   On: 06/06/2022 09:21    Assessment/Plan 52 year old female with history of  hypertension, diverticulosis, asthma be admitted to the hospital with asthma exacerbation.  No evidence of acute infection.  She seems to be significantly improved at the moment. -Observation admission -Continue IV steroids, and albuterol as needed -Anticipate if she remains stable, may be discharged home in the morning    GERD (gastroesophageal reflux disease)    HTN (hypertension)-she was previously on ACE inhibitor, but this was discontinued during her last hospital stay due to concern for possible ACE inhibitor induced cough.  She is now on metoprolol twice daily and this will be continued.  DVT prophylaxis: Lovenox     Code Status: Full Code  Consults called: None  Admission status: Observation  Time spent: 42 minutes  Erma Raiche Sharlette Dense MD [facility] Pager [phone]  If 7PM-7AM, please contact night-coverage [email or web address] Password [facility]  06/06/2022, 3:45 PM
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
## 2022-06-06 NOTE — Progress Notes (Signed)
Pt assessment incomplete.

## 2022-06-06 NOTE — Progress Notes (Signed)
                                                  Against Medical Advice Patient at this time expresses desire to leave the Hospital immediately, patient has been warned that this is not Medically advisable at this time, and can result in Medical complications like Death and Disability, patient understands and accepts the risks involved and assumes full responsibilty of this decision.  This patient has also been advised that if they feel the need for further medical assistance to return to any available ER or dial [phone].  Informed by Nursing staff that this patient has left care and has signed the form  Against Medical Advice on 06/06/2022 at 9:12 PM  Chinita Greenland BSN MSNA MSN ACNPC-AG Acute Care Nurse Practitioner [facility]

## 2022-06-06 NOTE — ED Triage Notes (Signed)
C/o SOB, cough, and wheezing at rest Audible wheezing noted in triage.  Hx asthma.  Recently admitted and left AMA on 06/03/2022

## 2022-06-06 NOTE — ED Notes (Signed)
I walked with patient back and forth in her room and her saturations did not drop, however, she states that she feels more short of breath when she is climbing stairs or doing something more strenuous

## 2023-03-02 ENCOUNTER — Emergency Department (HOSPITAL_BASED_OUTPATIENT_CLINIC_OR_DEPARTMENT_OTHER)
Admission: EM | Admit: 2023-03-02 | Discharge: 2023-03-02 | Disposition: A | Discharge status: Home / Self Care | Payer: BLUE CROSS/BLUE SHIELD | Attending: Emergency Medicine | Admitting: Emergency Medicine

## 2023-03-02 DIAGNOSIS — Z20822 Contact with and (suspected) exposure to covid-19: Secondary | ICD-10-CM | POA: Insufficient documentation

## 2023-03-02 DIAGNOSIS — J101 Influenza due to other identified influenza virus with other respiratory manifestations: Secondary | ICD-10-CM | POA: Insufficient documentation

## 2023-03-02 DIAGNOSIS — R519 Headache, unspecified: Secondary | ICD-10-CM | POA: Diagnosis present

## 2023-03-02 HISTORY — DX: Essential (primary) hypertension: I10

## 2023-03-02 MED ORDER — ACETAMINOPHEN 500 MG PO TABS
1000.0000 mg | ORAL_TABLET | Freq: Once | ORAL | Status: AC
  Administered 2023-03-02: 1000 mg via ORAL
  Filled 2023-03-02: qty 2

## 2023-03-02 MED ORDER — IBUPROFEN 200 MG PO TABS
600.0000 mg | ORAL_TABLET | Freq: Once | ORAL | Status: AC
  Administered 2023-03-02: 600 mg via ORAL
  Filled 2023-03-02: qty 1

## 2023-03-02 MED ORDER — ONDANSETRON HCL 4 MG PO TABS
4.0000 mg | ORAL_TABLET | Freq: Four times a day (QID) | ORAL | 0 refills | Status: DC
  Filled 2023-03-02: qty 12, 3d supply, fill #0

## 2023-03-02 NOTE — ED Provider Notes (Signed)
 [place] EMERGENCY DEPARTMENT AT [facility] Provider Note   CSN: [id number] Arrival date & time: 03/02/23  1140     History  Chief Complaint  Patient presents with   Headache    Briana Wade is a 53 y.o. female. who presents to the ED for headaches body aches.  Symptoms started yesterday with generalized weakness sore throat headaches body aches.  No coughing or congestion.  Denies fevers chills nausea vomiting abdominal pain.   Headache      Home Medications Prior to Admission medications   Medication Sig Start Date End Date Taking? Authorizing Provider  ondansetron  (ZOFRAN ) 4 MG tablet Take 1 tablet (4 mg total) by mouth every 6 (six) hours. 03/02/23  Yes Jabree Pernice A, DO      Allergies    Patient has no known allergies.    Review of Systems   Review of Systems  Neurological:  Positive for headaches.    Physical Exam Updated Vital Signs BP (!) 184/104   Pulse (!) 105   Temp 99.1 F (37.3 C)   Resp 18   SpO2 94%  Physical Exam Vitals and nursing note reviewed.  HENT:     Head: Normocephalic and atraumatic.  Eyes:     Pupils: Pupils are equal, round, and reactive to light.  Cardiovascular:     Rate and Rhythm: Normal rate and regular rhythm.  Pulmonary:     Effort: Pulmonary effort is normal.     Breath sounds: Normal breath sounds.  Abdominal:     Palpations: Abdomen is soft.     Tenderness: There is no abdominal tenderness.  Skin:    General: Skin is warm and dry.  Neurological:     Mental Status: She is alert.  Psychiatric:        Mood and Affect: Mood normal.     ED Results / Procedures / Treatments   Labs (all labs ordered are listed, but only abnormal results are displayed) Labs Reviewed  RESP PANEL BY RT-PCR (RSV, FLU A&B, COVID)  RVPGX2 - Abnormal; Notable for the following components:      Result Value   Influenza A by PCR POSITIVE (*)    All other components within normal limits     EKG None  Radiology DG Chest 2 View Result Date: 03/02/2023 CLINICAL DATA:  Cough and body aches beginning yesterday. EXAM: CHEST - 2 VIEW COMPARISON:  None Available. FINDINGS: The heart size and mediastinal contours are within normal limits. Both lungs are clear. The visualized skeletal structures are unremarkable. IMPRESSION: No active cardiopulmonary disease. Electronically Signed   By: Norleen DELENA Kil M.D.   On: 03/02/2023 12:50    Procedures Procedures    Medications Ordered in ED Medications  ibuprofen  (ADVIL ) tablet 600 mg (600 mg Oral Given 03/02/23 1427)  acetaminophen  (TYLENOL ) tablet 1,000 mg (1,000 mg Oral Given 03/02/23 1427)    ED Course/ Medical Decision Making/ A&P                                 Medical Decision Making 53 year old female with history as above presenting for 2 days of cough congestion body aches headaches.  Benign physical exam with no focal neurologic deficits.  No sudden onset headache that would be worrisome for subarachnoid hemorrhage.  No significant respiratory symptoms and chest x-ray is clear making pneumonia less likely.  Tested positive for influenza A which explains her symptoms.  Feels  better after some Tylenol  Motrin .  Will discharge with Zofran  as she has had some nausea as well.  Instructed on symptomatic management and return precautions  Amount and/or Complexity of Data Reviewed Radiology: ordered.  Risk OTC drugs. Prescription drug management.           Final Clinical Impression(s) / ED Diagnoses Final diagnoses:  Influenza A    Rx / DC Orders ED Discharge Orders          Ordered    ondansetron  (ZOFRAN ) 4 MG tablet  Every 6 hours        03/02/23 1459              Pamella Ozell LABOR, DO 03/02/23 1627

## 2023-03-02 NOTE — Discharge Instructions (Signed)
 You are seen in the emerged from for headaches body aches nausea You tested positive for influenza A We have called in a prescription for Zofran  for you to pick up from pharmacy begin taking as directed for nausea and vomiting Return to the emergency department for severe pain trouble breathing or any other concerns Take Tylenol  or Motrin  as directed for fevers and discomfort Follow-up with your primary care doctor in 1 week for reevaluation

## 2023-03-04 ENCOUNTER — Encounter (HOSPITAL_COMMUNITY): Payer: Self-pay

## 2023-03-12 ENCOUNTER — Other Ambulatory Visit (HOSPITAL_BASED_OUTPATIENT_CLINIC_OR_DEPARTMENT_OTHER): Payer: Self-pay

## 2023-04-13 ENCOUNTER — Emergency Department (HOSPITAL_COMMUNITY)

## 2023-04-13 ENCOUNTER — Encounter (HOSPITAL_COMMUNITY): Payer: Self-pay | Admitting: Emergency Medicine

## 2023-04-13 ENCOUNTER — Other Ambulatory Visit: Payer: Self-pay

## 2023-04-13 ENCOUNTER — Emergency Department (HOSPITAL_COMMUNITY)
Admission: EM | Admit: 2023-04-13 | Discharge: 2023-04-13 | Disposition: A | Discharge status: Home / Self Care | Attending: Emergency Medicine | Admitting: Emergency Medicine

## 2023-04-13 DIAGNOSIS — R079 Chest pain, unspecified: Secondary | ICD-10-CM | POA: Diagnosis present

## 2023-04-13 DIAGNOSIS — R0789 Other chest pain: Secondary | ICD-10-CM | POA: Diagnosis not present

## 2023-04-13 LAB — BASIC METABOLIC PANEL
Anion gap: 7 (ref 5–15)
BUN: 13 mg/dL (ref 6–20)
CO2: 27 mmol/L (ref 22–32)
Calcium: 9.3 mg/dL (ref 8.9–10.3)
Chloride: 103 mmol/L (ref 98–111)
Creatinine, Ser: 0.89 mg/dL (ref 0.44–1.00)
GFR, Estimated: >60 mL/min (ref >60)
Glucose, Bld: 112 mg/dL — ABNORMAL HIGH (ref 70–99)
Potassium: 3.8 mmol/L (ref 3.5–5.1)
Sodium: 137 mmol/L (ref 135–145)

## 2023-04-13 LAB — TROPONIN I (HIGH SENSITIVITY)
Troponin I (High Sensitivity): 3 ng/L (ref <18)
Troponin I (High Sensitivity): 3 ng/L (ref <18)

## 2023-04-13 LAB — CBC
HCT: 39.3 % (ref 36.0–46.0)
Hemoglobin: 12.3 g/dL (ref 12.0–15.0)
MCH: 27 pg (ref 26.0–34.0)
MCHC: 31.3 g/dL (ref 30.0–36.0)
MCV: 86.2 fL (ref 80.0–100.0)
Platelets: 303 10*3/uL (ref 150–400)
RBC: 4.56 MIL/uL (ref 3.87–5.11)
RDW: 14.6 % (ref 11.5–15.5)
WBC: 6 10*3/uL (ref 4.0–10.5)
nRBC: 0 % (ref 0.0–0.2)

## 2023-04-13 LAB — HCG, SERUM, QUALITATIVE: Preg, Serum: NEGATIVE

## 2023-04-13 MED ORDER — IBUPROFEN 600 MG PO TABS: 600.0000 mg | ORAL_TABLET | Freq: Four times a day (QID) | ORAL | 0 refills | Status: DC | PRN

## 2023-04-13 MED ORDER — IBUPROFEN 800 MG PO TABS
800.0000 mg | ORAL_TABLET | Freq: Once | ORAL | Status: AC
  Administered 2023-04-13: 800 mg via ORAL
  Filled 2023-04-13: qty 1

## 2023-04-13 NOTE — ED Triage Notes (Signed)
 Patient arrives via POV for chest pain and right arm pain with tingling in fingers that started earlier this morning.  Denies vomiting. Nausea noted.

## 2023-04-13 NOTE — ED Provider Notes (Addendum)
 [place] EMERGENCY DEPARTMENT AT [facility] Provider Note   CSN: [id number] Arrival date & time: 04/13/23  1431     History  Chief Complaint  Patient presents with   Chest Pain    Briana Wade is a 53 y.o. female.  53 year old female presents with constant right-sided chest pain since this morning.  States that she woke with the symptoms.  Denies any injury.  Characterizes the pain as sharp and worse with any certain movements.  She is right-hand dominant.  Denies any fever, cough, congestion.  No exertional symptoms.  Denies any rashes.  No treatment use prior to arrival      Home Medications Prior to Admission medications   Medication Sig Start Date End Date Taking? Authorizing Provider  acetaminophen (TYLENOL) 325 MG tablet Take 2 tablets (650 mg total) by mouth every 6 (six) hours as needed. 06/14/21   Sloan Leiter, DO  amLODipine (NORVASC) 5 MG tablet Take 1 tablet (5 mg total) by mouth daily. Patient not taking: Reported on 06/01/2022 08/05/20 12/03/20  Elvin So C, PA-C  amoxicillin (AMOXIL) 875 MG tablet Take 875 mg by mouth 2 (two) times daily. Patient not taking: Reported on 06/06/2022 04/02/22   [provider]  amoxicillin-clavulanate (AUGMENTIN) 875-125 MG tablet Take 1 tablet by mouth every 12 (twelve) hours. Patient not taking: Reported on 06/01/2022 09/28/21   Rosezella Rumpf, PA-C  benazepril (LOTENSIN) 10 MG tablet Take 10 mg by mouth daily.    [provider]  diphenhydrAMINE (BENADRYL) 25 MG tablet Take 1 tablet (25 mg total) by mouth every 6 (six) hours as needed. Patient not taking: Reported on 06/01/2022 08/08/20   Arby Barrette, MD  docusate sodium (COLACE) 100 MG capsule Take 1 capsule (100 mg total) by mouth daily as needed for mild constipation. Patient not taking: Reported on 06/01/2022 09/28/21   Rosezella Rumpf, PA-C  ibuprofen (ADVIL) 600 MG tablet Take 1 tablet (600 mg total) by mouth every 6 (six) hours as  needed. 06/14/21   Sloan Leiter, DO  metoCLOPramide (REGLAN) 10 MG tablet Take 1 tablet (10 mg total) by mouth every 6 (six) hours. Patient not taking: Reported on 09/07/2020 08/08/20   Arby Barrette, MD  metoprolol tartrate (LOPRESSOR) 25 MG tablet Take 25 mg by mouth 2 (two) times daily. 04/01/20   [provider]  ondansetron (ZOFRAN) 4 MG tablet Take 1 tablet (4 mg total) by mouth every 6 (six) hours. Patient not taking: Reported on 06/01/2022 09/28/21   Rosezella Rumpf, PA-C  ondansetron (ZOFRAN) 4 MG tablet Take 1 tablet (4 mg total) by mouth every 6 (six) hours. 03/02/23   Royanne Foots, DO  oxyCODONE-acetaminophen (PERCOCET) 5-325 MG tablet Take 1 tablet by mouth every 6 (six) hours as needed. Patient not taking: Reported on 06/01/2022 09/28/21   Rosezella Rumpf, PA-C      Allergies    Lisinopril and Lactose intolerance (gi)    Review of Systems   Review of Systems  All other systems reviewed and are negative.   Physical Exam Updated Vital Signs BP (!) 173/98   Pulse 96   Temp 98.7 F (37.1 C) (Oral)   Resp 18   Ht 1.549 m (5\' 1" )   Wt 95.3 kg   SpO2 98%   BMI 39.68 kg/m  Physical Exam Vitals and nursing note reviewed.  Constitutional:      General: She is not in acute distress.    Appearance: Normal appearance.  She is well-developed. She is not toxic-appearing.  HENT:     Head: Normocephalic and atraumatic.  Eyes:     General: Lids are normal.     Conjunctiva/sclera: Conjunctivae normal.     Pupils: Pupils are equal, round, and reactive to light.  Neck:     Thyroid: No thyroid mass.     Trachea: No tracheal deviation.  Cardiovascular:     Rate and Rhythm: Normal rate and regular rhythm.     Heart sounds: Normal heart sounds. No murmur heard.    No gallop.  Pulmonary:     Effort: Pulmonary effort is normal. No respiratory distress.     Breath sounds: Normal breath sounds. No stridor. No decreased breath sounds, wheezing, rhonchi or rales.  Chest:     Abdominal:     General: There is no distension.     Palpations: Abdomen is soft.     Tenderness: There is no abdominal tenderness. There is no rebound.  Musculoskeletal:        General: No tenderness. Normal range of motion.     Cervical back: Normal range of motion and neck supple.  Skin:    General: Skin is warm and dry.     Findings: No abrasion or rash.  Neurological:     Mental Status: She is alert and oriented to person, place, and time. Mental status is at baseline.     GCS: GCS eye subscore is 4. GCS verbal subscore is 5. GCS motor subscore is 6.     Cranial Nerves: No cranial nerve deficit.     Sensory: No sensory deficit.     Motor: Motor function is intact.  Psychiatric:        Attention and Perception: Attention normal.        Speech: Speech normal.        Behavior: Behavior normal.    ED Results / Procedures / Treatments   Labs (all labs ordered are listed, but only abnormal results are displayed) Labs Reviewed  BASIC METABOLIC PANEL - Abnormal; Notable for the following components:      Result Value   Glucose, Bld 112 (*)    All other components within normal limits  CBC  HCG, SERUM, QUALITATIVE  TROPONIN I (HIGH SENSITIVITY)  TROPONIN I (HIGH SENSITIVITY)    EKG EKG Interpretation Date/Time:  Tuesday April 13 2023 14:38:31 EDT Ventricular Rate:  97 PR Interval:  146 QRS Duration:  74 QT Interval:  354 QTC Calculation: 450 R Axis:   -20  Text Interpretation: Sinus rhythm Abnormal R-wave progression, early transition Left ventricular hypertrophy Nonspecific T abnormalities, lateral leads Baseline wander in lead(s) I III aVL aVF No significant change since last tracing Confirmed by Lorre Nick ([id number]) on 04/13/2023 4:29:55 PM  Radiology No results found.  Procedures Procedures    Medications Ordered in ED Medications  ibuprofen (ADVIL) tablet 800 mg (has no administration in time range)    ED Course/ Medical Decision Making/ A&P                                  Medical Decision Making Amount and/or Complexity of Data Reviewed Labs: ordered. Radiology: ordered.  Risk Prescription drug management.   Patient is EKG shows sinus rhythm and is unchanged from prior.  Pain is reproducible along her right anterior chest wall.  Heart score is 1.  Troponins x 2 negative.  Low suspicion for PE.  Chest x-ray without acute findings.  Will discharge home        Final Clinical Impression(s) / ED Diagnoses Final diagnoses:  None    Rx / DC Orders ED Discharge Orders     None         Lorre Nick, MD 04/13/23 1806    Lorre Nick, MD 04/13/23 1806

## 2023-05-05 ENCOUNTER — Encounter (HOSPITAL_COMMUNITY): Payer: Self-pay

## 2023-05-05 ENCOUNTER — Ambulatory Visit (HOSPITAL_COMMUNITY)
Admission: EM | Admit: 2023-05-05 | Discharge: 2023-05-05 | Disposition: A | Discharge status: Home / Self Care | Payer: Self-pay | Attending: Nurse Practitioner | Admitting: Nurse Practitioner

## 2023-05-05 DIAGNOSIS — J069 Acute upper respiratory infection, unspecified: Secondary | ICD-10-CM

## 2023-05-05 DIAGNOSIS — J40 Bronchitis, not specified as acute or chronic: Secondary | ICD-10-CM

## 2023-05-05 DIAGNOSIS — J4541 Moderate persistent asthma with (acute) exacerbation: Secondary | ICD-10-CM

## 2023-05-05 DIAGNOSIS — R030 Elevated blood-pressure reading, without diagnosis of hypertension: Secondary | ICD-10-CM

## 2023-05-05 LAB — POCT RAPID STREP A (OFFICE): Rapid Strep A Screen: NEGATIVE

## 2023-05-05 MED ORDER — AMOXICILLIN-POT CLAVULANATE 875-125 MG PO TABS: 1.0000 | ORAL_TABLET | Freq: Two times a day (BID) | ORAL | 0 refills | Status: AC

## 2023-05-05 MED ORDER — ALBUTEROL SULFATE HFA 108 (90 BASE) MCG/ACT IN AERS: 2.0000 | INHALATION_SPRAY | RESPIRATORY_TRACT | 0 refills | Status: DC | PRN

## 2023-05-05 MED ORDER — PROMETHAZINE-DM 6.25-15 MG/5ML PO SYRP
5.0000 mL | ORAL_SOLUTION | Freq: Four times a day (QID) | ORAL | 0 refills | Status: DC | PRN
Start: 2023-05-05 — End: 2023-06-02

## 2023-05-05 MED ORDER — PREDNISONE 10 MG PO TABS: ORAL_TABLET | ORAL | 0 refills | Status: AC

## 2023-05-05 NOTE — ED Triage Notes (Signed)
 Coughing up mucous x 2 weeks.  Patient reports sore throat. Home Intervention: None

## 2023-05-05 NOTE — Discharge Instructions (Addendum)
 Recommend use of your Albuterol inhaler every 4 hours for the next 3 days while awake.  The prednisone will help you breathe better.  Use the cough syrup for symptom management and control the cough.  Follow up for re-evaluation should the shortness of breath worsen, chest pain develop, or you begin running a fever.  Ensure adequate rest and oral hydration.  Please recheck your blood pressure at home and follow-up with your primary care provider if it remains greater than 140/90.

## 2023-05-05 NOTE — ED Provider Notes (Signed)
 [facility]    CSN: [id number] Arrival date & time: 05/05/23  1942      History   Chief Complaint Chief Complaint  Patient presents with   Cough   Sore Throat    HPI Briana Wade is a 53 y.o. female.   Patient presents requesting evaluation for a sore throat this been ongoing for the past several weeks.  She also endorses a constant productive cough and often times feels short of breath.  States she has a history of asthma.  She been try to use her inhaler twice daily but is not seeing any improvement.  She has also tried some Benadryl.  No reported fever, chest pain, abdominal pain, vomiting, or diarrhea.  States she has been hospitalized in the past due to her asthma.  The history is provided by the patient.  Cough Associated symptoms: shortness of breath and sore throat   Associated symptoms: no chest pain, no chills, no fever, no headaches, no myalgias and no rash   Sore Throat Associated symptoms include shortness of breath. Pertinent negatives include no chest pain, no abdominal pain and no headaches.    Past Medical History:  Diagnosis Date   Abdominal pain, unspecified site    Allergy    Anemia    Asthma    Chlamydia    Diverticulitis    Esophageal reflux    Hiatal hernia    Hypertension    IBS (irritable bowel syndrome)    Insomnia    Lactose intolerance    Migraine equivalent 08/2014   minor right arm weakness in [facility]. neg CVA on imaging.    Other constipation    Personal history of colonic polyps 09/12/2010   tubular adenoma   Trichimoniasis    Tubular adenoma of colon    Umbilical hernia    Uterine fibroid    Uterine leiomyoma     Patient Active Problem List   Diagnosis Date Noted   Asthma exacerbation 06/03/2022   Acute asthma exacerbation 06/01/2022   Class II obesity 06/01/2022   Hypocalcemia 06/01/2022   Chest pain 06/01/2022   Diverticulosis of sigmoid colon 04/27/2016   Acute diverticulitis of intestine 04/27/2016    Diverticula of colon 05/21/2015   Asthma 05/21/2015   Acute diverticulitis 05/21/2015   Diverticulitis of sigmoid colon 06/02/2011   Nausea and vomiting 06/02/2011   Otitis 06/02/2011   HTN (hypertension) 06/02/2011   GERD (gastroesophageal reflux disease) 10/21/2010   Gluten intolerance 10/21/2010   History of colonic polyps 10/21/2010   Flatulence, eructation, and gas pain 09/12/2010   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   Irritable bowel syndrome 09/12/2010   Abdominal pain 08/26/2010   Bloating 08/26/2010   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   IBS (irritable bowel syndrome) 08/26/2010   CONSTIPATION, CHRONIC 05/11/2007   Abdominal pain 05/11/2007    Past Surgical History:  Procedure Laterality Date   APPENDECTOMY     COLONOSCOPY     DILATION AND CURETTAGE OF UTERUS     x2 for EAB   HYSTEROSCOPY WITH NOVASURE N/A 12/17/2015   Procedure: HYSTEROSCOPY WITH NOVASURE;  Surgeon: Lowndes Bing, MD;  Location: [facility];  Service: Gynecology;  Laterality: N/A;   TUBAL LIGATION      OB History     Gravida  7   Para  3   Term  3   Preterm  0   AB  4   Living         SAB  1   IAB  3   Ectopic  0   Multiple      Live Births           Obstetric Comments  svd x 3          Home Medications    Prior to Admission medications   Medication Sig Start Date End Date Taking? Authorizing Provider  albuterol (VENTOLIN HFA) 108 (90 Base) MCG/ACT inhaler Inhale 2 puffs into the lungs every 4 (four) hours as needed for wheezing or shortness of breath. 05/05/23  Yes Burgess Estelle, FNP  amoxicillin-clavulanate (AUGMENTIN) 875-125 MG tablet Take 1 tablet by mouth every 12 (twelve) hours for 10 days. 05/05/23 05/15/23 Yes Burgess Estelle, FNP  predniSONE (DELTASONE) 10 MG tablet Take 6 tablets (60 mg total) by mouth daily with breakfast for 1 day, THEN 5 tablets (50 mg total) daily with breakfast for 1 day, THEN 4 tablets  (40 mg total) daily with breakfast for 1 day, THEN 3 tablets (30 mg total) daily with breakfast for 1 day, THEN 2 tablets (20 mg total) daily with breakfast for 1 day, THEN 1 tablet (10 mg total) daily with breakfast for 1 day. 05/05/23 05/11/23 Yes Burgess Estelle, FNP  promethazine-dextromethorphan (PROMETHAZINE-DM) 6.25-15 MG/5ML syrup Take 5 mLs by mouth every 6 (six) hours as needed for cough. 05/05/23  Yes Burgess Estelle, FNP  acetaminophen (TYLENOL) 325 MG tablet Take 2 tablets (650 mg total) by mouth every 6 (six) hours as needed. 06/14/21   Sloan Leiter, DO  benazepril (LOTENSIN) 10 MG tablet Take 10 mg by mouth daily.    [provider]  ibuprofen (ADVIL) 600 MG tablet Take 1 tablet (600 mg total) by mouth every 6 (six) hours as needed. 06/14/21   Sloan Leiter, DO  ibuprofen (ADVIL) 600 MG tablet Take 1 tablet (600 mg total) by mouth every 6 (six) hours as needed. 04/13/23   Lorre Nick, MD  metoprolol tartrate (LOPRESSOR) 25 MG tablet Take 25 mg by mouth 2 (two) times daily. 04/01/20   [provider]    Family History Family History  Problem Relation Age of Onset   Hypertension Mother    Cancer Mother        not sure of type   Asthma Cousin    Asthma Son    Colon cancer Neg Hx    Esophageal cancer Neg Hx    Rectal cancer Neg Hx    Stomach cancer Neg Hx     Social History Social History   Tobacco Use   Smoking status: Never   Smokeless tobacco: Never  Vaping Use   Vaping status: Never Used  Substance Use Topics   Alcohol use: No   Drug use: No     Allergies   Lisinopril and Lactose intolerance (gi)   Review of Systems Review of Systems  Constitutional:  Negative for chills and fever.  HENT:  Positive for congestion, sinus pressure, sinus pain and sore throat.   Eyes:  Negative for pain and redness.  Respiratory:  Positive for cough and shortness of breath.   Cardiovascular:  Negative for chest pain and palpitations.  Gastrointestinal:  Negative  for abdominal pain, diarrhea and vomiting.  Genitourinary:  Negative for dysuria.  Musculoskeletal:  Negative for myalgias.  Skin:  Negative for rash.  Neurological:  Negative for dizziness and headaches.     Physical Exam Triage Vital Signs ED Triage Vitals [05/05/23 2005]  Encounter Vitals Group  BP (!) 152/101     Systolic BP Percentile      Diastolic BP Percentile      Pulse Rate 95     Resp 16     Temp 98.3 F (36.8 C)     Temp Source Oral     SpO2 95 %     Weight      Height      Head Circumference      Peak Flow      Pain Score      Pain Loc      Pain Education      Exclude from Growth Chart    No data found.  Updated Vital Signs BP (!) 152/100 (BP Location: Left Arm)   Pulse (!) 108   Temp 98.3 F (36.8 C) (Oral)   Resp 18   SpO2 98%   Visual Acuity Right Eye Distance:   Left Eye Distance:   Bilateral Distance:    Right Eye Near:   Left Eye Near:    Bilateral Near:     Physical Exam Vitals and nursing note reviewed.  HENT:     Head: Normocephalic.     Right Ear: There is impacted cerumen.     Left Ear: Tympanic membrane and ear canal normal.     Nose: Congestion present.     Mouth/Throat:     Mouth: Mucous membranes are moist.     Pharynx: No posterior oropharyngeal erythema.  Eyes:     Conjunctiva/sclera: Conjunctivae normal.     Pupils: Pupils are equal, round, and reactive to light.  Cardiovascular:     Rate and Rhythm: Regular rhythm. Tachycardia present.     Heart sounds: Normal heart sounds.  Pulmonary:     Effort: Pulmonary effort is normal. No respiratory distress.     Breath sounds: Normal breath sounds.     Comments: Diminished lung sounds throughout with expiratory wheezing bilaterally. Abdominal:     General: Bowel sounds are normal.  Skin:    General: Skin is warm and dry.  Neurological:     General: No focal deficit present.     Mental Status: She is alert and oriented to person, place, and time.  Psychiatric:         Mood and Affect: Mood normal.        Behavior: Behavior normal.        Thought Content: Thought content normal.        Judgment: Judgment normal.     UC Treatments / Results  Labs (all labs ordered are listed, but only abnormal results are displayed) Labs Reviewed  POCT RAPID STREP A (OFFICE) - Normal    EKG   Radiology No results found.  Procedures Procedures (including critical care time)  Medications Ordered in UC Medications - No data to display  Initial Impression / Assessment and Plan / UC Course  I have reviewed the triage vital signs and the nursing notes.  Pertinent labs & imaging results that were available during my care of the patient were reviewed by me and considered in my medical decision making (see chart for details).    Patient presents for evaluation of a sore throat, shortness of breath, and a productive cough-symptoms have been ongoing for 2 weeks.  On physical examination, she does have a constant cough, her lung sounds are diminished with expiratory wheezing throughout bilaterally.  I do feel that she is experiencing an asthma exacerbation in addition to URI symptoms.  At this  point, I feel that antibiotic therapy is reasonable.  Have also prescribed a short steroid taper, albuterol inhaler refill, and Promethazine DM cough syrup to help with symptom management.  I encouraged her to use her albuterol inhaler every 4 hours for the next 2 to 3 days.  Will defer imaging at today's visit-however, she is aware that she develops a fever, chest pain, worsening shortness of breath that we would consider upon reevaluation.  Ensure adequate rest and oral hydration.  Her blood pressure is slightly elevated today's visit-she does have a means to check her blood pressure at home.  She is aware to follow-up with her primary care provider if it remains greater than 140/90. Final Clinical Impressions(s) / UC Diagnoses   Final diagnoses:  Bronchitis  Moderate persistent  asthma with exacerbation  Upper respiratory tract infection, unspecified type  Elevated blood pressure reading     Discharge Instructions      Recommend use of your Albuterol inhaler every 4 hours for the next 3 days while awake.  The prednisone will help you breathe better.  Use the cough syrup for symptom management and control the cough.  Follow up for re-evaluation should the shortness of breath worsen, chest pain develop, or you begin running a fever.  Ensure adequate rest and oral hydration.  Please recheck your blood pressure at home and follow-up with your primary care provider if it remains greater than 140/90.     ED Prescriptions     Medication Sig Dispense Auth. Provider   amoxicillin-clavulanate (AUGMENTIN) 875-125 MG tablet Take 1 tablet by mouth every 12 (twelve) hours for 10 days. 20 tablet Burgess Estelle, FNP   predniSONE (DELTASONE) 10 MG tablet Take 6 tablets (60 mg total) by mouth daily with breakfast for 1 day, THEN 5 tablets (50 mg total) daily with breakfast for 1 day, THEN 4 tablets (40 mg total) daily with breakfast for 1 day, THEN 3 tablets (30 mg total) daily with breakfast for 1 day, THEN 2 tablets (20 mg total) daily with breakfast for 1 day, THEN 1 tablet (10 mg total) daily with breakfast for 1 day. 21 tablet Burgess Estelle, FNP   promethazine-dextromethorphan (PROMETHAZINE-DM) 6.25-15 MG/5ML syrup Take 5 mLs by mouth every 6 (six) hours as needed for cough. 118 mL Burgess Estelle, FNP   albuterol (VENTOLIN HFA) 108 (90 Base) MCG/ACT inhaler Inhale 2 puffs into the lungs every 4 (four) hours as needed for wheezing or shortness of breath. 18 g Burgess Estelle, FNP      PDMP not reviewed this encounter.   Burgess Estelle, FNP 05/05/23 2029

## 2023-06-02 ENCOUNTER — Other Ambulatory Visit: Payer: Self-pay

## 2023-06-02 ENCOUNTER — Encounter: Payer: Self-pay | Admitting: Physician Assistant

## 2023-06-02 ENCOUNTER — Ambulatory Visit: Payer: Self-pay | Admitting: Physician Assistant

## 2023-06-02 VITALS — BP 153/98 | HR 95

## 2023-06-02 DIAGNOSIS — R9389 Abnormal findings on diagnostic imaging of other specified body structures: Secondary | ICD-10-CM

## 2023-06-02 DIAGNOSIS — B9689 Other specified bacterial agents as the cause of diseases classified elsewhere: Secondary | ICD-10-CM

## 2023-06-02 DIAGNOSIS — J209 Acute bronchitis, unspecified: Secondary | ICD-10-CM

## 2023-06-02 DIAGNOSIS — I10 Essential (primary) hypertension: Secondary | ICD-10-CM

## 2023-06-02 LAB — AMB RESULTS CONSOLE CBG: Glucose: 104

## 2023-06-02 MED ORDER — PREDNISONE 20 MG PO TABS
ORAL_TABLET | ORAL | 0 refills | Status: AC
  Filled 2023-06-02: qty 13, 8d supply, fill #0

## 2023-06-02 MED ORDER — ALBUTEROL SULFATE HFA 108 (90 BASE) MCG/ACT IN AERS
2.0000 | INHALATION_SPRAY | RESPIRATORY_TRACT | 0 refills | Status: DC | PRN
Start: 2023-06-02 — End: 2023-07-06
  Filled 2023-06-02: qty 6.7, 17d supply, fill #0

## 2023-06-02 MED ORDER — AZITHROMYCIN 250 MG PO TABS
ORAL_TABLET | ORAL | 0 refills | Status: AC
  Filled 2023-06-02: qty 6, 5d supply, fill #0

## 2023-06-02 MED ORDER — ALBUTEROL SULFATE HFA 108 (90 BASE) MCG/ACT IN AERS
2.0000 | INHALATION_SPRAY | Freq: Once | RESPIRATORY_TRACT | Status: AC
  Administered 2023-06-02: 2 via RESPIRATORY_TRACT

## 2023-06-02 MED ORDER — HYDROCHLOROTHIAZIDE 25 MG PO TABS
25.0000 mg | ORAL_TABLET | Freq: Every day | ORAL | 1 refills | Status: DC
  Filled 2023-06-02: qty 30, 30d supply, fill #0

## 2023-06-02 MED ORDER — PROMETHAZINE-DM 6.25-15 MG/5ML PO SYRP
5.0000 mL | ORAL_SOLUTION | Freq: Four times a day (QID) | ORAL | 0 refills | Status: DC | PRN
  Filled 2023-06-02: qty 118, 6d supply, fill #0

## 2023-06-02 MED ORDER — BENZONATATE 100 MG PO CAPS
100.0000 mg | ORAL_CAPSULE | Freq: Three times a day (TID) | ORAL | 0 refills | Status: DC | PRN
  Filled 2023-06-02: qty 20, 4d supply, fill #0
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
## 2023-06-02 NOTE — Patient Instructions (Addendum)
 VISIT SUMMARY:  During today's visit, we discussed your persistent cough and sore throat, your hypertension management, and an abnormal endometrial thickness noted in a previous imaging study. We also talked about financial assistance options for your medications.  YOUR PLAN:  -COUGH WITH SPUTUM PRODUCTION: You have a persistent cough that produces clear and occasionally yellow mucus, along with a sore throat. We will try a different antibiotic, provide a prednisone  taper, and give you an albuterol  inhaler to help with your breathing. Additionally, you will have cough medication for both day and night relief. A chest x-ray has been ordered to further investigate the cause of your symptoms. We also referred you to a [facility] pharmacy for more affordable medication options.  -HYPERTENSION: Your blood pressure is not well-controlled because you are not consistently taking your medication. We discussed starting you on hydrochlorothiazide, which is a medication that helps lower blood pressure. You should take this medication in the morning. We will follow up in two weeks to see how you are responding to the new medication.  -ENDOMETRIAL THICKNESS IN POSTMENOPAUSAL STATE: An imaging study from September 2023 showed that the lining of your uterus is thicker than normal for someone who is postmenopausal. This needs further evaluation, so we are referring you to [facility] for a possible ultrasound.  -GENERAL HEALTH MAINTENANCE: We discussed options for financial assistance to help with the cost of your medications. You will be provided with an application for the [facility] financial assistance program.  INSTRUCTIONS:  Please follow up in two weeks to assess your response to the new hypertension medication. Additionally, make sure to complete the chest x-ray and follow up with [facility] for further evaluation of your endometrial thickness.  Acute Bronchitis,  Adult  Acute bronchitis is sudden inflammation of the main airways (bronchi) that come off the windpipe (trachea) in the lungs. The swelling causes the airways to get smaller and make more mucus than normal. This can make it hard to breathe and can cause coughing or noisy breathing (wheezing). Acute bronchitis may last several weeks. The cough may last longer. Allergies, asthma, and exposure to smoke may make the condition worse. What are the causes? This condition can be caused by germs and by substances that irritate the lungs, including: Cold and flu viruses. The most common cause of this condition is the virus that causes the common cold. Bacteria. This is less common. Breathing in substances that irritate the lungs, including: Smoke from cigarettes and other forms of tobacco. Dust and pollen. Fumes from household cleaning products, gases, or burned fuel. Indoor or outdoor air pollution. What increases the risk? The following factors may make you more likely to develop this condition: A weak body's defense system, also called the immune system. A condition that affects your lungs and breathing, such as asthma. What are the signs or symptoms? Common symptoms of this condition include: Coughing. This may bring up clear, yellow, or green mucus from your lungs (sputum). Wheezing. Runny or stuffy nose. Having too much mucus in your lungs (chest congestion). Shortness of breath. Aches and pains, including sore throat or chest. How is this diagnosed? This condition is usually diagnosed based on: Your symptoms and medical history. A physical exam. You may also have other tests, including tests to rule out other conditions, such as pneumonia. These tests include: A test of lung function. Test of a mucus sample to look for the presence of bacteria. Tests to check the oxygen level in  your blood. Blood tests. Chest X-ray. How is this treated? Most cases of acute bronchitis clear up over  time without treatment. Your health care provider may recommend: Drinking more fluids to help thin your mucus so it is easier to cough up. Taking inhaled medicine (inhaler) to improve air flow in and out of your lungs. Using a vaporizer or a humidifier. These are machines that add water to the air to help you breathe better. Taking a medicine that thins mucus and clears congestion (expectorant). Taking a medicine that prevents or stops coughing (cough suppressant). It is not common to take an antibiotic medicine for this condition. Follow these instructions at home:  Take over-the-counter and prescription medicines only as told by your health care provider. Use an inhaler, vaporizer, or humidifier as told by your health care provider. Take two teaspoons (10 mL) of honey at bedtime to lessen coughing at night. Drink enough fluid to keep your urine pale yellow. Do not use any products that contain nicotine or tobacco. These products include cigarettes, chewing tobacco, and vaping devices, such as e-cigarettes. If you need help quitting, ask your health care provider. Get plenty of rest. Return to your normal activities as told by your health care provider. Ask your health care provider what activities are safe for you. Keep all follow-up visits. This is important. How is this prevented? To lower your risk of getting this condition again: Wash your hands often with soap and water for at least 20 seconds. If soap and water are not available, use hand sanitizer. Avoid contact with people who have cold symptoms. Try not to touch your mouth, nose, or eyes with your hands. Avoid breathing in smoke or chemical fumes. Breathing smoke or chemical fumes will make your condition worse. Get the flu shot every year. Contact a health care provider if: Your symptoms do not improve after 2 weeks. You have trouble coughing up the mucus. Your cough keeps you awake at night. You have a fever. Get help right  away if you: Cough up blood. Feel pain in your chest. Have severe shortness of breath. Faint or keep feeling like you are going to faint. Have a severe headache. Have a fever or chills that get worse. These symptoms may represent a serious problem that is an emergency. Do not wait to see if the symptoms will go away. Get medical help right away. Call your local emergency services (911 in the [place] Do not drive yourself to the hospital. Summary Acute bronchitis is inflammation of the main airways (bronchi) that come off the windpipe (trachea) in the lungs. The swelling causes the airways to get smaller and make more mucus than normal. Drinking more fluids can help thin your mucus so it is easier to cough up. Take over-the-counter and prescription medicines only as told by your health care provider. Do not use any products that contain nicotine or tobacco. These products include cigarettes, chewing tobacco, and vaping devices, such as e-cigarettes. If you need help quitting, ask your health care provider. Contact a health care provider if your symptoms do not improve after 2 weeks. This information is not intended to replace advice given to you by your health care provider. Make sure you discuss any questions you have with your health care provider. Document Revised: 04/24/2021 Document Reviewed: 05/15/2020 Elsevier Patient Education  2024 [employer].

## 2023-06-02 NOTE — Progress Notes (Signed)
 Patient came to mobile screening at [facility] in regard to hypertension. Pt stated she is prescribed BP medication but has not been taking them consistently. The medication can give a headache effect. Pt is looking to possibly change medication to avoid side effects. BP was elevated 167/94>153/86. Non fasting glucose 104. We went over healthy lifestyle tips and recommended seeing provider about medication change. Housing and food SDOH indicated. Resources given.

## 2023-06-04 ENCOUNTER — Other Ambulatory Visit (HOSPITAL_COMMUNITY): Payer: Self-pay

## 2023-06-04 ENCOUNTER — Other Ambulatory Visit: Payer: Self-pay

## 2023-06-14 ENCOUNTER — Encounter (HOSPITAL_COMMUNITY): Payer: Self-pay

## 2023-06-14 ENCOUNTER — Other Ambulatory Visit: Payer: Self-pay

## 2023-06-14 ENCOUNTER — Emergency Department (HOSPITAL_COMMUNITY)
Admission: EM | Admit: 2023-06-14 | Discharge: 2023-06-14 | Discharge status: Against Medical Advice | Payer: Self-pay | Attending: Emergency Medicine | Admitting: Emergency Medicine

## 2023-06-14 DIAGNOSIS — K0889 Other specified disorders of teeth and supporting structures: Secondary | ICD-10-CM | POA: Insufficient documentation

## 2023-06-14 DIAGNOSIS — G43809 Other migraine, not intractable, without status migrainosus: Secondary | ICD-10-CM | POA: Insufficient documentation

## 2023-06-14 MED ORDER — PROCHLORPERAZINE EDISYLATE 10 MG/2ML IJ SOLN
10.0000 mg | Freq: Once | INTRAMUSCULAR | Status: DC
  Filled 2023-06-14: qty 2

## 2023-06-14 MED ORDER — DIPHENHYDRAMINE HCL 25 MG PO CAPS
25.0000 mg | ORAL_CAPSULE | Freq: Once | ORAL | Status: DC
  Filled 2023-06-14: qty 1

## 2023-06-14 MED ORDER — KETOROLAC TROMETHAMINE 15 MG/ML IJ SOLN
15.0000 mg | Freq: Once | INTRAMUSCULAR | Status: DC
  Filled 2023-06-14: qty 1

## 2023-06-14 NOTE — ED Provider Notes (Signed)
 [place] EMERGENCY DEPARTMENT AT [facility] Provider Note   CSN: [id number] Arrival date & time: 06/14/23  1336     History  Chief Complaint  Patient presents with   Migraine   Dental Pain    Janashia Parco is a 53 y.o. female.  53 year old female presents today for concern of migraine headache, dental pain.  History of migraine headaches.  Has not seen a dentist yet.  The history is provided by the patient. No language interpreter was used.       Home Medications Prior to Admission medications   Medication Sig Start Date End Date Taking? Authorizing Provider  acetaminophen  (TYLENOL ) 325 MG tablet Take 2 tablets (650 mg total) by mouth every 6 (six) hours as needed. 06/14/21   Teddi Favors, DO  albuterol  (VENTOLIN  HFA) 108 (90 Base) MCG/ACT inhaler Inhale 2 puffs into the lungs every 4 (four) hours as needed for wheezing or shortness of breath. 06/02/23   Mayers, Cari S, PA-C  benzonatate  (TESSALON ) 100 MG capsule Take 1-2 capsules (100-200 mg total) by mouth 3 (three) times daily as needed. 06/02/23   Mayers, Cari S, PA-C  hydrochlorothiazide  (HYDRODIURIL ) 25 MG tablet Take 1 tablet (25 mg total) by mouth daily. 06/02/23   Mayers, Cari S, PA-C  ibuprofen  (ADVIL ) 600 MG tablet Take 1 tablet (600 mg total) by mouth every 6 (six) hours as needed. Patient not taking: Reported on 06/02/2023 06/14/21   Russella Courts A, DO  ibuprofen  (ADVIL ) 600 MG tablet Take 1 tablet (600 mg total) by mouth every 6 (six) hours as needed. Patient not taking: Reported on 06/02/2023 04/13/23   Lind Repine, MD  promethazine -dextromethorphan  (PROMETHAZINE -DM) 6.25-15 MG/5ML syrup Take 5 mLs by mouth every 6 (six) hours as needed for cough. 06/02/23   Mayers, Cari S, PA-C      Allergies    Lisinopril and Lactose intolerance (gi)    Review of Systems   Review of Systems  Constitutional:  Negative for chills and fever.  HENT:  Positive for dental problem. Negative for drooling, trouble  swallowing and voice change.   Eyes:  Positive for photophobia. Negative for visual disturbance.  Neurological:  Positive for headaches.  All other systems reviewed and are negative.   Physical Exam Updated Vital Signs BP (!) 186/104   Pulse 92   Temp 97.7 F (36.5 C)   Resp 18   Ht 5\' 1"  (1.549 m)   Wt 95.3 kg   SpO2 97%   BMI 39.68 kg/m  Physical Exam Vitals and nursing note reviewed.  Constitutional:      General: She is not in acute distress.    Appearance: Normal appearance. She is not ill-appearing.  HENT:     Head: Normocephalic and atraumatic.     Nose: Nose normal.     Mouth/Throat:     Comments: No evidence of peritonsillar abscess, retropharyngeal abscess, or Ludwig's angina. Eyes:     Conjunctiva/sclera: Conjunctivae normal.  Cardiovascular:     Rate and Rhythm: Normal rate and regular rhythm.  Pulmonary:     Effort: Pulmonary effort is normal. No respiratory distress.  Musculoskeletal:        General: No deformity. Normal range of motion.  Skin:    Findings: No rash.  Neurological:     General: No focal deficit present.     Mental Status: She is alert.     ED Results / Procedures / Treatments   Labs (all labs ordered are listed, but  only abnormal results are displayed) Labs Reviewed - No data to display  EKG None  Radiology No results found.  Procedures Procedures    Medications Ordered in ED Medications  ketorolac  (TORADOL ) 15 MG/ML injection 15 mg (0 mg Intramuscular Hold 06/14/23 1506)  prochlorperazine  (COMPAZINE ) injection 10 mg (0 mg Intramuscular Hold 06/14/23 1506)  diphenhydrAMINE  (BENADRYL ) capsule 25 mg (0 mg Oral Hold 06/14/23 1506)    ED Course/ Medical Decision Making/ A&P                                 Medical Decision Making Risk Prescription drug management.   53 year old female presents today for concern of headache, dental pain.  No evidence of retropharyngeal abscess, Ludwig's angina, peritonsillar abscess.   History of migraine headaches.  This feels similar.  Has taken over-the-counter medicines over the past week without relief.  Offered migraine cocktail which she was in agreement with.  When nurse went in to administer these patient stated that she is not a fan of medicines and ultimately had eloped.   I was not able to evaluate patient prior to her eloping.   Final Clinical Impression(s) / ED Diagnoses Final diagnoses:  Other migraine without status migrainosus, not intractable    Rx / DC Orders ED Discharge Orders     None         Lucina Sabal, PA-C 06/14/23 1532    Mozell Arias, MD 06/14/23 1622

## 2023-06-14 NOTE — ED Triage Notes (Signed)
 Pt arrives via POV. Pt reports she has been experiencing dental pain and a migraine for the past week. Pt denies other symptoms. Pt AxOx4.

## 2023-06-14 NOTE — ED Provider Triage Note (Cosign Needed)
 Emergency Medicine Provider Triage Evaluation Note  Eshal Propps Beason , a 53 y.o. female  was evaluated in triage.  Pt complains of migraine headache, dental pain.  History of migraine headaches..  Review of Systems  Positive: As above Negative: As above  Physical Exam  BP (!) 186/104   Pulse 92   Temp 97.7 F (36.5 C)   Resp 18   Ht 5\' 1"  (1.549 m)   Wt 95.3 kg   SpO2 97%   BMI 39.68 kg/m  Gen:   Awake, no distress   Resp:  Normal effort  MSK:   Moves extremities without difficulty  Other:    Medical Decision Making  Medically screening exam initiated at 2:14 PM.  Appropriate orders placed.  Klohe Lovering Fukuda was informed that the remainder of the evaluation will be completed by another provider, this initial triage assessment does not replace that evaluation, and the importance of remaining in the ED until their evaluation is complete.    Lucina Sabal, PA-C 06/14/23 1415

## 2023-06-15 ENCOUNTER — Ambulatory Visit (HOSPITAL_COMMUNITY)

## 2023-06-22 ENCOUNTER — Telehealth: Payer: Self-pay | Admitting: General Practice

## 2023-06-22 NOTE — Telephone Encounter (Signed)
 Contacted pt to get her scheduled for follow up visit. Pt is scheduled for 5/28

## 2023-06-23 ENCOUNTER — Ambulatory Visit: Payer: Self-pay | Admitting: Physician Assistant

## 2023-06-23 ENCOUNTER — Encounter: Payer: Self-pay | Admitting: Physician Assistant

## 2023-06-23 ENCOUNTER — Other Ambulatory Visit: Payer: Self-pay

## 2023-06-23 VITALS — BP 167/100 | HR 85 | Ht 61.0 in | Wt 223.0 lb

## 2023-06-23 DIAGNOSIS — F439 Reaction to severe stress, unspecified: Secondary | ICD-10-CM

## 2023-06-23 DIAGNOSIS — Z13220 Encounter for screening for lipoid disorders: Secondary | ICD-10-CM

## 2023-06-23 DIAGNOSIS — R5383 Other fatigue: Secondary | ICD-10-CM

## 2023-06-23 DIAGNOSIS — Z1231 Encounter for screening mammogram for malignant neoplasm of breast: Secondary | ICD-10-CM

## 2023-06-23 DIAGNOSIS — Z5902 Unsheltered homelessness: Secondary | ICD-10-CM

## 2023-06-23 DIAGNOSIS — K0889 Other specified disorders of teeth and supporting structures: Secondary | ICD-10-CM

## 2023-06-23 DIAGNOSIS — R519 Headache, unspecified: Secondary | ICD-10-CM

## 2023-06-23 DIAGNOSIS — I10 Essential (primary) hypertension: Secondary | ICD-10-CM

## 2023-06-23 DIAGNOSIS — N6310 Unspecified lump in the right breast, unspecified quadrant: Secondary | ICD-10-CM

## 2023-06-23 DIAGNOSIS — F5104 Psychophysiologic insomnia: Secondary | ICD-10-CM

## 2023-06-23 MED ORDER — IBUPROFEN 600 MG PO TABS
600.0000 mg | ORAL_TABLET | Freq: Four times a day (QID) | ORAL | 0 refills | Status: DC | PRN
  Filled 2023-06-23: qty 30, 8d supply, fill #0

## 2023-06-23 MED ORDER — HYDROCHLOROTHIAZIDE 50 MG PO TABS
50.0000 mg | ORAL_TABLET | Freq: Every day | ORAL | 1 refills | Status: DC
  Filled 2023-06-23: qty 30, 30d supply, fill #0

## 2023-06-23 MED ORDER — PENICILLIN V POTASSIUM 500 MG PO TABS
500.0000 mg | ORAL_TABLET | Freq: Three times a day (TID) | ORAL | 0 refills | Status: AC
  Filled 2023-06-23: qty 30, 10d supply, fill #0

## 2023-06-23 MED ORDER — HYDROXYZINE HCL 10 MG PO TABS
10.0000 mg | ORAL_TABLET | Freq: Three times a day (TID) | ORAL | 0 refills | Status: DC | PRN
  Filled 2023-06-23: qty 60, 20d supply, fill #0
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
## 2023-06-23 NOTE — Patient Instructions (Addendum)
 VISIT SUMMARY:  During today's visit, we discussed your ongoing headaches, high blood pressure, sleep difficulties, and other health concerns. We reviewed your current medications and made some adjustments to better manage your symptoms. We also talked about ways to improve your overall health and access to healthcare services.  YOUR PLAN:  -UNCONTROLLED HYPERTENSION: Your blood pressure is still high despite taking your current medication. We have increased your dose of hydrochlorothiazide  to 50 mg daily to help lower your blood pressure to the target of 130/80 mmHg.  -HEADACHES: Your daily headaches may be caused by several factors including high blood pressure, dehydration, stress, sleep issues, vision needs, dental problems, or thyroid dysfunction. We have prescribed a stronger ibuprofen  for pain relief and ordered blood work to check your thyroid function.  -DEHYDRATION: Not drinking enough water can contribute to headaches and other health issues. Please try to drink four bottles of water daily to stay hydrated.  -SLEEP DISTURBANCE: You are having trouble sleeping, which is affecting your overall health. We have prescribed hydroxyzine 10 mg to take every 8 hours as needed, with the option to take two tablets at bedtime to help you sleep better.  -STRESS AND ANXIETY: Your high stress levels are affecting your headaches and sleep. Hydroxyzine will also help manage your stress and anxiety.  -DENTAL ISSUES: Your  teeth may be contributing to your headaches. We have prescribed penicillin to help manage any dental pain.  -GENERAL HEALTH MAINTENANCE: You are overdue for a mammogram since 2022. We have ordered a mammogram and will use scholarships to cover the cost. We will also arrange an appointment with a financial counselor to help you apply for Medicaid and find other resources.  Managing Stress, Adult Feeling a certain amount of stress is normal. Stress helps our body and mind get ready to  deal with the demands of life. Stress hormones can motivate you to do well at work and meet your responsibilities. But severe or long-term (chronic) stress can affect your mental and physical health. Chronic stress puts you at higher risk for: Anxiety and depression. Other health problems such as digestive problems, muscle aches, heart disease, high blood pressure, and stroke. What are the causes? Common causes of stress include: Demands from work, such as deadlines, feeling overworked, or having long hours. Pressures at home, such as money issues, disagreements with a spouse, or parenting issues. Pressures from major life changes, such as divorce, moving, loss of a loved one, or chronic illness. You may be at higher risk for stress-related problems if you: Do not get enough sleep. Are in poor health. Do not have emotional support. Have a mental health disorder such as anxiety or depression. How to recognize stress Stress can make you: Have trouble sleeping. Feel sad, anxious, irritable, or overwhelmed. Lose your appetite. Overeat or want to eat unhealthy foods. Want to use drugs or alcohol. Stress can also cause physical symptoms, such as: Sore, tense muscles, especially in the shoulders and neck. Headaches. Trouble breathing. A faster heart rate. Stomach pain, nausea, or vomiting. Diarrhea or constipation. Trouble concentrating. Follow these instructions at home: Eating and drinking Eat a healthy diet. This includes: Eating foods that are high in fiber, such as beans, whole grains, and fresh fruits and vegetables. Limiting foods that are high in fat and processed sugars, such as fried or sweet foods. Do not skip meals or overeat. Drink enough fluid to keep your urine pale yellow. Alcohol use Do not drink alcohol if: Your health care provider tells  you not to drink. You are pregnant, may be pregnant, or are planning to become pregnant. Drinking alcohol is a way some people try  to ease their stress. This can be dangerous, so if you drink alcohol: Limit how much you have to: 0-1 drink a day for women. 0-2 drinks a day for men. Know how much alcohol is in your drink. In the [place] one drink equals one 12 oz bottle of beer (355 mL), one 5 oz glass of wine (148 mL), or one 1 oz glass of hard liquor (44 mL). Activity  Include 30 minutes of exercise in your daily schedule. Exercise is a good stress reducer. Include time in your day for an activity that you find relaxing. Try taking a walk, going on a bike ride, reading a book, or listening to music. Schedule your time in a way that lowers stress, and keep a regular schedule. Focus on doing what is most important to get done. Lifestyle Identify the source of your stress and your reaction to it. See a therapist who can help you change unhelpful reactions. When there are stressful events: Talk about them with family, friends, or coworkers. Try to think realistically about stressful events and not ignore them or overreact. Try to find the positives in a stressful situation and not focus on the negatives. Cut back on responsibilities at work and home, if possible. Ask for help from friends or family members if you need it. Find ways to manage stress, such as: Mindfulness, meditation, or deep breathing. Yoga or tai chi. Progressive muscle relaxation. Spending time in nature. Doing art, playing music, or reading. Making time for fun activities. Spending time with family and friends. Get support from family, friends, or spiritual resources. General instructions Get enough sleep. Try to go to sleep and get up at about the same time every day. Take over-the-counter and prescription medicines only as told by your health care provider. Do not use any products that contain nicotine or tobacco. These products include cigarettes, chewing tobacco, and vaping devices, such as e-cigarettes. If you need help quitting, ask your health  care provider. Do not use drugs or smoke to deal with stress. Keep all follow-up visits. This is important. Where to find support Talk with your health care provider about stress management or finding a support group. Find a therapist to work with you on your stress management techniques. Where to find more information [employer] on Mental Illness: www.nami.org American Psychological Association: [employer]ice[employer].ca Contact a health care provider if: Your stress symptoms get worse. You are unable to manage your stress at home. You are struggling to stop using drugs or alcohol. Get help right away if: You may be a danger to yourself or others. You have any thoughts of death or suicide. Get help right awayif you feel like you may hurt yourself or others, or have thoughts about taking your own life. Go to your nearest emergency room or: Call 911. Call the National Suicide Prevention Lifeline at 779-273-4834 or 988 in the [place] This is open 24 hours a day. If you're a Veteran: Call 988 and press 1. This is open 24 hours a day. Text the [employer] at 248-748-1538. Summary Feeling a certain amount of stress is normal, but severe or long-term (chronic) stress can affect your mental and physical health. Chronic stress can put you at higher risk for anxiety, depression, and other health problems such as digestive problems, muscle aches, heart disease, high blood pressure, and stroke.  You may be at higher risk for stress-related problems if you do not get enough sleep, are in poor health, lack emotional support, or have a mental health disorder such as anxiety or depression. Identify the source of your stress and your reaction to it. Try talking about stressful events with family, friends, or coworkers, finding a coping method, or getting support from spiritual resources. If you need more help, talk with your health care provider about finding a support group or a mental health therapist. This  information is not intended to replace advice given to you by your health care provider. Make sure you discuss any questions you have with your health care provider. Document Revised: 08/27/2022 Document Reviewed: 08/06/2020 Elsevier Patient Education  2024 [employer].

## 2023-06-24 ENCOUNTER — Other Ambulatory Visit: Payer: Self-pay

## 2023-06-24 ENCOUNTER — Ambulatory Visit: Payer: Self-pay | Admitting: Physician Assistant

## 2023-06-24 DIAGNOSIS — E782 Mixed hyperlipidemia: Secondary | ICD-10-CM

## 2023-06-24 LAB — CBC WITH DIFFERENTIAL/PLATELET
Basophils Absolute: 0 10*3/uL (ref 0.0–0.2)
Basos: 0 %
EOS (ABSOLUTE): 0.2 10*3/uL (ref 0.0–0.4)
Eos: 4 %
Hematocrit: 39.8 % (ref 34.0–46.6)
Hemoglobin: 12.8 g/dL (ref 11.1–15.9)
Immature Grans (Abs): 0 10*3/uL (ref 0.0–0.1)
Immature Granulocytes: 0 %
Lymphocytes Absolute: 1.5 10*3/uL (ref 0.7–3.1)
Lymphs: 32 %
MCH: 27.3 pg (ref 26.6–33.0)
MCHC: 32.2 g/dL (ref 31.5–35.7)
MCV: 85 fL (ref 79–97)
Monocytes Absolute: 0.3 10*3/uL (ref 0.1–0.9)
Monocytes: 6 %
Neutrophils Absolute: 2.8 10*3/uL (ref 1.4–7.0)
Neutrophils: 58 %
Platelets: 280 10*3/uL (ref 150–450)
RBC: 4.69 x10E6/uL (ref 3.77–5.28)
RDW: 13.6 % (ref 11.7–15.4)
WBC: 4.9 10*3/uL (ref 3.4–10.8)

## 2023-06-24 LAB — LIPID PANEL
Chol/HDL Ratio: 4.4 ratio (ref 0.0–4.4)
Cholesterol, Total: 219 mg/dL — ABNORMAL HIGH (ref 100–199)
HDL: 50 mg/dL (ref >39)
LDL Chol Calc (NIH): 144 mg/dL — ABNORMAL HIGH (ref 0–99)
Triglycerides: 141 mg/dL (ref 0–149)
VLDL Cholesterol Cal: 25 mg/dL (ref 5–40)

## 2023-06-24 LAB — COMP. METABOLIC PANEL (12)
AST: 16 IU/L (ref 0–40)
Albumin: 4.4 g/dL (ref 3.8–4.9)
Alkaline Phosphatase: 124 IU/L — ABNORMAL HIGH (ref 44–121)
BUN/Creatinine Ratio: 10 (ref 9–23)
BUN: 8 mg/dL (ref 6–24)
Bilirubin Total: 0.4 mg/dL (ref 0.0–1.2)
Calcium: 9.4 mg/dL (ref 8.7–10.2)
Chloride: 100 mmol/L (ref 96–106)
Creatinine, Ser: 0.77 mg/dL (ref 0.57–1.00)
Globulin, Total: 2.8 g/dL (ref 1.5–4.5)
Glucose: 78 mg/dL (ref 70–99)
Potassium: 4 mmol/L (ref 3.5–5.2)
Sodium: 141 mmol/L (ref 134–144)
Total Protein: 7.2 g/dL (ref 6.0–8.5)
eGFR: 92 mL/min/{1.73_m2} (ref >59)

## 2023-06-24 LAB — TSH: TSH: 2.62 u[IU]/mL (ref 0.450–4.500)

## 2023-06-24 MED ORDER — ATORVASTATIN CALCIUM 10 MG PO TABS
10.0000 mg | ORAL_TABLET | Freq: Every day | ORAL | 2 refills | Status: AC
  Filled 2023-06-24: qty 30, 30d supply, fill #0

## 2023-06-30 ENCOUNTER — Other Ambulatory Visit: Payer: Self-pay

## 2023-07-06 ENCOUNTER — Other Ambulatory Visit: Payer: Self-pay

## 2023-07-06 ENCOUNTER — Encounter: Payer: Self-pay | Admitting: Physician Assistant

## 2023-07-06 ENCOUNTER — Ambulatory Visit: Admitting: Physician Assistant

## 2023-07-06 ENCOUNTER — Telehealth: Payer: Self-pay | Admitting: General Practice

## 2023-07-06 VITALS — BP 143/87 | HR 85

## 2023-07-06 DIAGNOSIS — Z5902 Unsheltered homelessness: Secondary | ICD-10-CM | POA: Insufficient documentation

## 2023-07-06 DIAGNOSIS — I10 Essential (primary) hypertension: Secondary | ICD-10-CM | POA: Diagnosis not present

## 2023-07-06 DIAGNOSIS — J321 Chronic frontal sinusitis: Secondary | ICD-10-CM

## 2023-07-06 DIAGNOSIS — J4520 Mild intermittent asthma, uncomplicated: Secondary | ICD-10-CM

## 2023-07-06 DIAGNOSIS — K0889 Other specified disorders of teeth and supporting structures: Secondary | ICD-10-CM

## 2023-07-06 DIAGNOSIS — F331 Major depressive disorder, recurrent, moderate: Secondary | ICD-10-CM | POA: Insufficient documentation

## 2023-07-06 DIAGNOSIS — F439 Reaction to severe stress, unspecified: Secondary | ICD-10-CM

## 2023-07-06 MED ORDER — CETIRIZINE HCL 10 MG PO TABS
10.0000 mg | ORAL_TABLET | Freq: Every day | ORAL | 11 refills | Status: AC
  Filled 2023-07-06: qty 30, 30d supply, fill #0

## 2023-07-06 MED ORDER — SERTRALINE HCL 50 MG PO TABS
50.0000 mg | ORAL_TABLET | Freq: Every day | ORAL | 3 refills | Status: AC
  Filled 2023-07-06: qty 30, 30d supply, fill #0

## 2023-07-06 MED ORDER — ALBUTEROL SULFATE HFA 108 (90 BASE) MCG/ACT IN AERS
2.0000 | INHALATION_SPRAY | RESPIRATORY_TRACT | 0 refills | Status: AC | PRN
  Filled 2023-07-06: qty 18, 17d supply, fill #0

## 2023-07-06 MED ORDER — METHYLPREDNISOLONE 4 MG PO TBPK
ORAL_TABLET | ORAL | 0 refills | Status: DC
  Filled 2023-07-06: qty 21, 6d supply, fill #0

## 2023-07-06 MED ORDER — FLUTICASONE PROPIONATE 50 MCG/ACT NA SUSP
2.0000 | Freq: Every day | NASAL | 6 refills | Status: AC
  Filled 2023-07-06: qty 16, 30d supply, fill #0
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
## 2023-07-06 NOTE — Patient Instructions (Signed)
 VISIT SUMMARY:  During your visit, we discussed your ongoing issues with blood pressure, chronic cough, dental pain, asthma, and depression. We reviewed your current medications and made some adjustments to better manage your symptoms. We also discussed your upcoming health screenings and financial counseling to help with your medical expenses.  YOUR PLAN:  -CHRONIC COUGH WITH POSTNASAL DRIP: Your chronic cough with clear sputum and postnasal drip is likely due to sinusitis or allergic rhinitis, which are conditions that cause inflammation in your nasal passages. We are starting you on Flonase nasal spray and Zyrtec  to help manage these symptoms. You will also be referred to an ENT specialist for further evaluation, and we are prescribing another course of oral steroids. Continue using your albuterol  inhaler as needed.  -HYPERTENSION: Your blood pressure has improved with the increased dose of hydrochlorothiazide , a medication that helps lower blood pressure. Continue taking hydrochlorothiazide  50 mg and monitor your blood pressure at home.  -DEPRESSION: Your depression, which is related to the loss of your job and home, can affect your mood and overall well-being. We are starting you on Zoloft to help manage your mood. Please monitor for any side effects and let us  know how you are feeling.  -GENERAL HEALTH MAINTENANCE: You are scheduled for a breast cancer screening and a PAP test on July 24th. It is important to attend these screenings. Additionally, you will be establishing care with Dr. Adan Holms on July 15th.  -FINANCIAL COUNSELING: We have referred you to financial counselor Constantino Demark to help you with applications for financial aid.   Cough, Adult Coughing is a reflex that clears your throat and airways (respiratory system). It helps heal and protect your lungs. It is normal to cough from time to time. A cough that happens with other symptoms or that lasts a long time may be a sign of a condition  that needs treatment. A short-term (acute) cough may only last 2-3 weeks. A long-term (chronic) cough may last 8 or more weeks. Coughing is often caused by: Diseases, such as: An infection of the respiratory system. Asthma or other heart or lung diseases. Gastroesophageal reflux. This is when acid comes back up from the stomach. Breathing in things that irritate your lungs. Allergies. Postnasal drip. This is when mucus runs down the back of your throat. Smoking. Some medicines. Follow these instructions at home: Medicines Take over-the-counter and prescription medicines only as told by your health care provider. Talk with your provider before you take cough medicine (cough suppressants). Eating and drinking Do not drink alcohol. Avoid caffeine. Drink enough fluid to keep your pee (urine) pale yellow. Lifestyle Avoid cigarette smoke. Do not use any products that contain nicotine or tobacco. These products include cigarettes, chewing tobacco, and vaping devices, such as e-cigarettes. If you need help quitting, ask your provider. Avoid things that make you cough. These may include perfumes, candles, cleaning products, or campfire smoke. General instructions  Watch for any changes to your cough. Tell your provider about them. Always cover your mouth when you cough. If the air is dry in your bedroom or home, use a cool mist vaporizer or humidifier. If your cough is worse at night, try to sleep in a semi-upright position. Rest as needed. Contact a health care provider if: You have new symptoms, or your symptoms get worse. You cough up pus. You have a fever that does not go away or a cough that does not get better after 2-3 weeks. You cannot control your cough with medicine, and  you are losing sleep. You have pain that gets worse or is not helped with medicine. You lose weight for no clear reason. You have night sweats. Get help right away if: You cough up blood. You have trouble  breathing. Your heart is beating very fast. These symptoms may be an emergency. Get help right away. Call 911. Do not wait to see if the symptoms will go away. Do not drive yourself to the hospital. This information is not intended to replace advice given to you by your health care provider. Make sure you discuss any questions you have with your health care provider. Document Revised: 09/12/2021 Document Reviewed: 09/12/2021 Elsevier Patient Education  2024 [employer].

## 2023-07-06 NOTE — Telephone Encounter (Signed)
 Contacted pt to inform her to come in for follow up visit. Pt is scheduled for 6/10

## 2023-07-06 NOTE — Progress Notes (Signed)
 Non fasting last meal at 11:00am

## 2023-07-08 ENCOUNTER — Ambulatory Visit

## 2023-07-12 ENCOUNTER — Ambulatory Visit

## 2023-07-14 ENCOUNTER — Other Ambulatory Visit: Payer: Self-pay

## 2023-07-22 ENCOUNTER — Encounter: Payer: Self-pay | Admitting: Physician Assistant

## 2023-08-09 ENCOUNTER — Telehealth: Payer: Self-pay | Admitting: Family Medicine

## 2023-08-09 NOTE — Telephone Encounter (Signed)
 Called pt to confirm appt.Pt did not answer and could not LVM.

## 2023-08-10 ENCOUNTER — Other Ambulatory Visit: Payer: Self-pay

## 2023-08-10 ENCOUNTER — Ambulatory Visit: Attending: Family Medicine | Admitting: Family Medicine

## 2023-08-10 ENCOUNTER — Encounter: Payer: Self-pay | Admitting: Family Medicine

## 2023-08-10 VITALS — BP 129/81 | HR 81 | Temp 98.2°F | Ht 61.0 in | Wt 225.0 lb

## 2023-08-10 DIAGNOSIS — R221 Localized swelling, mass and lump, neck: Secondary | ICD-10-CM

## 2023-08-10 DIAGNOSIS — I10 Essential (primary) hypertension: Secondary | ICD-10-CM

## 2023-08-10 DIAGNOSIS — J45909 Unspecified asthma, uncomplicated: Secondary | ICD-10-CM | POA: Diagnosis not present

## 2023-08-10 DIAGNOSIS — F5104 Psychophysiologic insomnia: Secondary | ICD-10-CM

## 2023-08-10 DIAGNOSIS — Z1211 Encounter for screening for malignant neoplasm of colon: Secondary | ICD-10-CM

## 2023-08-10 DIAGNOSIS — F331 Major depressive disorder, recurrent, moderate: Secondary | ICD-10-CM

## 2023-08-10 MED ORDER — HYDROXYZINE HCL 25 MG PO TABS
25.0000 mg | ORAL_TABLET | Freq: Every evening | ORAL | 1 refills | Status: DC | PRN
  Filled 2023-08-10: qty 30, 30d supply, fill #0

## 2023-08-10 MED ORDER — BUDESONIDE-FORMOTEROL FUMARATE 160-4.5 MCG/ACT IN AERO
2.0000 | INHALATION_SPRAY | Freq: Two times a day (BID) | RESPIRATORY_TRACT | 3 refills | Status: AC
  Filled 2023-08-10: qty 10.2, 30d supply, fill #0

## 2023-08-10 MED ORDER — AMLODIPINE BESYLATE 10 MG PO TABS
10.0000 mg | ORAL_TABLET | Freq: Every day | ORAL | 1 refills | Status: DC
  Filled 2023-08-10: qty 90, 90d supply, fill #0

## 2023-08-10 NOTE — Progress Notes (Signed)
 Subjective:  Patient ID: Briana Wade, female    DOB: [birth year]  Age: 53 y.o. MRN: [id number]  CC: Establish Care     Discussed the use of AI scribe software for clinical note transcription with the patient, who gave verbal consent to proceed.  History of Present Illness Briana Wade is a 53 year old female with a history of hypertension, hyperlipidemia asthma who presents with a chronic cough.  She experiences a chronic dry cough for several years, exacerbated by her asthma. She uses albuterol  as needed but is not on a regular maintenance inhaler. She has tried Zyrtec  and Flonase  but reduced her use due to drowsiness and lethargy.  Her hypertension is managed with amlodipine  10 mg, and she no longer takes hydrochlorothiazide . Her blood pressure was previously elevated at 167/100 at the visit to the mobile clinic but is normal today.  She has anxiety and depression, managed with sertraline  daily and hydroxyzine  as needed, though she reports difficulty sleeping and inconsistent use of hydroxyzine .  She expresses concern about a possible thyroid  issue due to a sensation of neck swelling. She has not had a recent primary care visit and recalls a past endoscopy without follow-up due to insurance issues.  Her high cholesterol is treated with atorvastatin , which she has not been taking consistently.    Past Medical History:  Diagnosis Date   Abdominal pain, unspecified site    Allergy     Anemia    Asthma    Chlamydia    Diverticulitis    Esophageal reflux    Hiatal hernia    Hypertension    IBS (irritable bowel syndrome)    Insomnia    Lactose intolerance    Migraine equivalent 08/2014   minor right arm weakness in [facility]. neg CVA on imaging.    Other constipation    Personal history of colonic polyps 09/12/2010   tubular adenoma   Trichimoniasis    Tubular adenoma of colon    Umbilical hernia    Uterine fibroid    Uterine leiomyoma     Past Surgical  History:  Procedure Laterality Date   APPENDECTOMY     COLONOSCOPY     DILATION AND CURETTAGE OF UTERUS     x2 for EAB   HYSTEROSCOPY WITH NOVASURE N/A 12/17/2015   Procedure: HYSTEROSCOPY WITH NOVASURE;  Surgeon: Bebe Furry, MD;  Location: [facility];  Service: Gynecology;  Laterality: N/A;   TUBAL LIGATION      Family History  Problem Relation Age of Onset   Hypertension Mother    Cancer Mother        not sure of type   Asthma Cousin    Asthma Son    Colon cancer Neg Hx    Esophageal cancer Neg Hx    Rectal cancer Neg Hx    Stomach cancer Neg Hx     Social History   Socioeconomic History   Marital status: Legally Separated    Spouse name: Not on file   Number of children: 3   Years of education: Not on file   Highest education level: Not on file  Occupational History   Occupation: Unemployed  Tobacco Use   Smoking status: Never   Smokeless tobacco: Never  Vaping Use   Vaping status: Never Used  Substance and Sexual Activity   Alcohol use: No   Drug use: No   Sexual activity: Yes    Birth control/protection: Surgical  Other Topics Concern   Not on file  Social History Narrative   ** Merged History Encounter **       Social Drivers of Health   Financial Resource Strain: Not on file  Food Insecurity: Food Insecurity Present (06/02/2023)   Hunger Vital Sign    Worried About Running Out of Food in the Last Year: Often true    Ran Out of Food in the Last Year: Often true  Transportation Needs: No Transportation Needs (06/02/2023)   PRAPARE - Administrator, Civil Service (Medical): No    Lack of Transportation (Non-Medical): No  Physical Activity: Not on file  Stress: Not on file  Social Connections: Not on file    Allergies  Allergen Reactions   Lisinopril Cough   Lactose Intolerance (Gi)     Outpatient Medications Prior to Visit  Medication Sig Dispense Refill   albuterol  (VENTOLIN  HFA) 108 (90 Base) MCG/ACT inhaler Inhale 2 puffs into  the lungs every 4 (four) hours as needed for wheezing or shortness of breath. 18 g 0   atorvastatin  (LIPITOR) 10 MG tablet Take 1 tablet (10 mg total) by mouth daily. 30 tablet 2   cetirizine  (ZYRTEC  ALLERGY ) 10 MG tablet Take 1 tablet (10 mg total) by mouth daily. 30 tablet 11   fluticasone  (FLONASE ) 50 MCG/ACT nasal spray Place 2 sprays into both nostrils daily. 16 g 6   ibuprofen  (ADVIL ) 600 MG tablet Take 1 tablet (600 mg total) by mouth every 6 (six) hours as needed. 30 tablet 0   sertraline  (ZOLOFT ) 50 MG tablet Take 1 tablet (50 mg total) by mouth daily. 30 tablet 3   acetaminophen  (TYLENOL ) 325 MG tablet Take 2 tablets (650 mg total) by mouth every 6 (six) hours as needed. 36 tablet 0   hydrochlorothiazide  (HYDRODIURIL ) 50 MG tablet Take 1 tablet (50 mg total) by mouth daily. 30 tablet 1   hydrOXYzine  (ATARAX ) 10 MG tablet Take 1 tablet (10 mg total) by mouth 3 (three) times daily as needed. 60 tablet 0   methylPREDNISolone  (MEDROL  DOSEPAK) 4 MG TBPK tablet Use per instructions on package 21 tablet 0   promethazine -dextromethorphan  (PROMETHAZINE -DM) 6.25-15 MG/5ML syrup Take 5 mLs by mouth every 6 (six) hours as needed for cough. 118 mL 0   No facility-administered medications prior to visit.     ROS Review of Systems  Constitutional:  Negative for activity change and appetite change.  HENT:  Negative for sinus pressure and sore throat.   Respiratory:  Positive for cough. Negative for chest tightness, shortness of breath and wheezing.   Cardiovascular:  Negative for chest pain and palpitations.  Gastrointestinal:  Negative for abdominal distention, abdominal pain and constipation.  Genitourinary: Negative.   Musculoskeletal: Negative.   Psychiatric/Behavioral:  Negative for behavioral problems and dysphoric mood.     Objective:  BP 129/81 (BP Location: Left Arm, Patient Position: Sitting, Cuff Size: Large)   Pulse 81   Temp 98.2 F (36.8 C)   Ht 5' 1 (1.549 m)   Wt 225 lb  (102.1 kg)   SpO2 99%   BMI 42.51 kg/m      08/10/2023   10:16 AM 07/06/2023    2:55 PM 06/23/2023    9:59 AM  BP/Weight  Systolic BP 129 143 167  Diastolic BP 81 87 100  Wt. (Lbs) 225  223  BMI 42.51 kg/m2  42.14 kg/m2      Physical Exam Constitutional:      Appearance: She is well-developed.  Cardiovascular:     Rate and  Rhythm: Normal rate.     Heart sounds: Normal heart sounds. No murmur heard. Pulmonary:     Effort: Pulmonary effort is normal.     Breath sounds: Normal breath sounds. No wheezing or rales.  Chest:     Chest wall: No tenderness.  Abdominal:     General: Bowel sounds are normal. There is no distension.     Palpations: Abdomen is soft. There is no mass.     Tenderness: There is no abdominal tenderness.  Musculoskeletal:        General: Normal range of motion.     Right lower leg: No edema.     Left lower leg: No edema.  Neurological:     Mental Status: She is alert and oriented to person, place, and time.  Psychiatric:        Mood and Affect: Mood normal.        Latest Ref Rng & Units 06/23/2023    1:03 PM 04/13/2023    2:50 PM 06/06/2022    1:08 PM  CMP  Glucose 70 - 99 mg/dL 78  887    BUN 6 - 24 mg/dL 8  13    Creatinine 9.42 - 1.00 mg/dL 9.22  9.10  9.30   Sodium 134 - 144 mmol/L 141  137    Potassium 3.5 - 5.2 mmol/L 4.0  3.8    Chloride 96 - 106 mmol/L 100  103    CO2 22 - 32 mmol/L  27    Calcium  8.7 - 10.2 mg/dL 9.4  9.3    Total Protein 6.0 - 8.5 g/dL 7.2     Total Bilirubin 0.0 - 1.2 mg/dL 0.4     Alkaline Phos 44 - 121 IU/L 124     AST 0 - 40 IU/L 16       Lipid Panel     Component Value Date/Time   CHOL 219 (H) 06/23/2023 1303   TRIG 141 06/23/2023 1303   HDL 50 06/23/2023 1303   CHOLHDL 4.4 06/23/2023 1303   LDLCALC 144 (H) 06/23/2023 1303    CBC    Component Value Date/Time   WBC 4.9 06/23/2023 1303   WBC 6.0 04/13/2023 1450   RBC 4.69 06/23/2023 1303   RBC 4.56 04/13/2023 1450   HGB 12.8 06/23/2023 1303    HCT 39.8 06/23/2023 1303   PLT 280 06/23/2023 1303   MCV 85 06/23/2023 1303   MCH 27.3 06/23/2023 1303   MCH 27.0 04/13/2023 1450   MCHC 32.2 06/23/2023 1303   MCHC 31.3 04/13/2023 1450   RDW 13.6 06/23/2023 1303   LYMPHSABS 1.5 06/23/2023 1303   MONOABS 0.9 06/02/2022 1130   EOSABS 0.2 06/23/2023 1303   BASOSABS 0.0 06/23/2023 1303    Lab Results  Component Value Date   HGBA1C 5.4 03/26/2020    Lab Results  Component Value Date   TSH 2.620 06/23/2023       Assessment & Plan Asthma/chronic Cough Chronic dry cough likely related to asthma. Symbicort  prescribed for maintenance therapy. - Prescribe Symbicort  for maintenance, to be used twice daily. - Evaluate the effectiveness of Symbicort  in three months.  Hypertension Blood pressure well-controlled with amlodipine  10 mg. Discontinued hydrochlorothiazide . - Discontinue hydrochlorothiazide . - Prescribe amlodipine  10 mg. - Ensure refills for amlodipine  are available.  Hyperlipidemia Elevated cholesterol levels require management with atorvastatin  and dietary changes. - Prescribe atorvastatin  for cholesterol management. - Advise on a low cholesterol diet.  Anxiety and Depression Anxiety and depression managed with sertraline   and hydroxyzine  for sleep. - Continue sertraline  for anxiety and depression. - Prescribe hydroxyzine  25 mg at night for sleep.  Anterior neck swelling Sensation of neck swelling and will need to exclude thyroid  enlargement. Normal thyroid  levels, ultrasound needed. -No swelling noticed on exam. - Order thyroid  ultrasound to evaluate for goiter.  Screening for colon cancer/general Health Maintenance Requires colonoscopy and follow-up for comprehensive health maintenance. - Refer for colonoscopy. - Schedule a follow-up in three months to assess the effectiveness of the new medication regimen.    Meds ordered this encounter  Medications   budesonide -formoterol  (SYMBICORT ) 160-4.5 MCG/ACT  inhaler    Sig: Inhale 2 puffs into the lungs 2 (two) times daily.    Dispense:  10.2 g    Refill:  3   hydrOXYzine  (ATARAX ) 25 MG tablet    Sig: Take 1 tablet (25 mg total) by mouth at bedtime as needed.    Dispense:  90 tablet    Refill:  1   amLODipine  (NORVASC ) 10 MG tablet    Sig: Take 1 tablet (10 mg total) by mouth daily.    Dispense:  90 tablet    Refill:  1    Follow-up: Return in about 3 months (around 11/10/2023) for Chronic medical conditions.       Corrina Sabin, MD, FAAFP. [facility] [place], [place] [phone]   08/10/2023, 12:08 PM

## 2023-08-10 NOTE — Patient Instructions (Signed)
 VISIT SUMMARY:  During your visit, we discussed your chronic cough, hypertension, high cholesterol, anxiety and depression, and a possible thyroid  issue. We have made some adjustments to your medications and recommended further evaluations to ensure comprehensive care.  YOUR PLAN:  -CHRONIC COUGH: Your chronic dry cough is likely related to your asthma. We have prescribed Symbicort  for maintenance therapy, which you should use twice daily. We will evaluate the effectiveness of this treatment in three months.  -HYPERTENSION: Your blood pressure is well-controlled with amlodipine  10 mg, so we have discontinued hydrochlorothiazide . Please continue taking amlodipine  10 mg daily, and ensure you have refills available.  -HYPERLIPIDEMIA: Your elevated cholesterol levels need to be managed with atorvastatin  and dietary changes. Please take atorvastatin  as prescribed and follow a low cholesterol diet.  -ANXIETY AND DEPRESSION: Your anxiety and depression are managed with sertraline  and hydroxyzine  for sleep. Continue taking sertraline  daily and use hydroxyzine  25 mg at night as needed for sleep.  -NECK SWELLING: The sensation of neck swelling suggests a possible thyroid  enlargement. We have ordered a thyroid  ultrasound to evaluate this further.  -GENERAL HEALTH MAINTENANCE: You need a colonoscopy for routine health maintenance. We will also schedule a follow-up in three months to assess the effectiveness of your new medication regimen.  INSTRUCTIONS:  Please schedule a thyroid  ultrasound and a colonoscopy as soon as possible. We will have a follow-up appointment in three months to review the effectiveness of your new medications and overall health.

## 2023-08-11 ENCOUNTER — Inpatient Hospital Stay
Admission: RE | Admit: 2023-08-11 | Discharge: 2023-08-11 | Discharge status: Home / Self Care | Source: Ambulatory Visit | Attending: Family Medicine | Admitting: Family Medicine

## 2023-08-11 DIAGNOSIS — R221 Localized swelling, mass and lump, neck: Secondary | ICD-10-CM

## 2023-08-12 ENCOUNTER — Encounter (HOSPITAL_COMMUNITY): Payer: Self-pay | Admitting: Oral Surgery

## 2023-08-12 ENCOUNTER — Other Ambulatory Visit: Payer: Self-pay

## 2023-08-12 NOTE — H&P (Signed)
  Patient: Briana Wade  PID: [id number]  DOB: 1970-04-27  SEX: Female   Patient referred by DDS for extraction teeth # 2, 5, 18.  CC: Pain upper right, lower left.    Attempted surgery in office 08/04/2023 but unable to obtain IV access.  Past Medical History:  High Blood Pressure, Asthma, High Cholesterol, Morbid Obesity, Dental anxiety   Medications: Flonase , Zyrtec , Meclizine, Medrol  Dose Pack, Ventolin , HCTZ, Amoxicillin , Motrin    Allergies:     NKDA    Surgeries:   Endoscopy         Social History       Smoking:            Alcohol: Drug use:                             Exam: BMI 41. Caries teeth # 2, 5, 18.  No purulence, edema, fluctuance, trismus. Oral cancer screening negative. Pharynx clear. Mallampati 1. No lymphadenopathy.  Panorex: Caries teeth # 2, 5, 18.   Assessment:  ASA  3. Non-restorable teeth#  2, 5, 18.            Plan: Extraction Teeth # 2, 5, 18. GA, Day surgery. Rx: none             Risks and complications explained. Questions answered.   Glendia EMERSON Primrose, DMD

## 2023-08-12 NOTE — Anesthesia Preprocedure Evaluation (Addendum)
 Anesthesia Evaluation    Reviewed: Allergy  & Precautions, Patient's Chart, lab work & pertinent test results  History of Anesthesia Complications Negative for: history of anesthetic complications  Airway        Dental   Pulmonary asthma           Cardiovascular hypertension, Pt. on medications      Neuro/Psych  Headaches   Depression       GI/Hepatic Neg liver ROS, hiatal hernia,GERD  Medicated,,  Endo/Other    Class 3 obesity  Renal/GU negative Renal ROS  negative genitourinary   Musculoskeletal negative musculoskeletal ROS (+)    Abdominal   Peds  Hematology negative hematology ROS (+)   Anesthesia Other Findings Dental caries  Reproductive/Obstetrics negative OB ROS                              Anesthesia Physical Anesthesia Plan  ASA: 2  Anesthesia Plan: General   Post-op Pain Management: Minimal or no pain anticipated   Induction: Intravenous  PONV Risk Score and Plan: 3 and Treatment may vary due to age or medical condition, Ondansetron , Dexamethasone  and Midazolam   Airway Management Planned: Nasal ETT  Additional Equipment: None  Intra-op Plan:   Post-operative Plan: Extubation in OR  Informed Consent:   Plan Discussed with:   Anesthesia Plan Comments: (Patient arrived in preop for scheduled surgery and per RN report was dyspneic and said she couldn't breath without gasping. On my arrival she has already used her albuterol  inhaler and is feeling better but is using a fan by her face. She has had multiple ED visits for cough over the past few years. She saw her PCP 3 days ago who prescribed Symbicort  which she has not yet picked up from the pharmacy. We discussed that she is not medically optimized and that I do not advise elective surgery in this scenario. She understands and plans to pick up her prescribed inhaler and see her PCP before rescheduling. Law[place], MD)          Anesthesia Quick Evaluation

## 2023-08-12 NOTE — Progress Notes (Signed)
 SDW call  Patient was given pre-op instructions over the phone. Patient verbalized understanding of instructions provided.     PCP - Dr. Corrina Sabin Cardiologist - denies Pulmonary:    PPM/ICD - denies Device Orders - na Rep Notified - na   Chest x-ray - 04/13/2023 EKG -  04/13/2023 Stress Test - ECHO - 06/11/2022, [facility] Cardiac Cath -  PFT: 01/13/2019  Sleep Study/sleep apnea/CPAP: denies  Non-diabetic   Blood Thinner Instructions: denies Aspirin  Instructions:denies   ERAS Protcol - NPO  Anesthesia review: No   Patient denies shortness of breath, fever, cough and chest pain over the phone call  Your procedure is scheduled on Friday August 13, 2023  Report to [facility] Main Entrance A at 0800  A.M., then check in with the Admitting office.  Call this number if you have problems the morning of surgery:  [phone]   If you have any questions prior to your surgery date call [phone]: Open Monday-Friday 8am-4pm If you experience any cold or flu symptoms such as cough, fever, chills, shortness of breath, etc. between now and your scheduled surgery, please notify us  at the above number    Remember:  Do not eat or drink after midnight the night before your surgery  Take these medicines the morning of surgery with A SIP OF WATER:  Amlodipine , atorvastatin , zyrtec , omeprazole   As needed: Albuterol , flonase   As of today, STOP taking any Aspirin  (unless otherwise instructed by your surgeon) Aleve , Naproxen , Ibuprofen , Motrin , Advil , Goody's, BC's, all herbal medications, fish oil, and all vitamins.

## 2023-08-13 ENCOUNTER — Encounter (HOSPITAL_COMMUNITY): Payer: Self-pay | Admitting: Anesthesiology

## 2023-08-13 ENCOUNTER — Ambulatory Visit (HOSPITAL_COMMUNITY)
Admission: RE | Admit: 2023-08-13 | Discharge: 2023-08-13 | Disposition: A | Discharge status: Home / Self Care | Attending: Oral Surgery | Admitting: Oral Surgery

## 2023-08-13 ENCOUNTER — Encounter (HOSPITAL_COMMUNITY)
Admission: RE | Disposition: A | Discharge status: Home / Self Care | Payer: Self-pay | Source: Home / Self Care | Attending: Oral Surgery

## 2023-08-13 ENCOUNTER — Encounter (HOSPITAL_COMMUNITY): Payer: Self-pay | Admitting: Oral Surgery

## 2023-08-13 DIAGNOSIS — I10 Essential (primary) hypertension: Secondary | ICD-10-CM | POA: Insufficient documentation

## 2023-08-13 DIAGNOSIS — K0889 Other specified disorders of teeth and supporting structures: Secondary | ICD-10-CM | POA: Diagnosis present

## 2023-08-13 DIAGNOSIS — Z539 Procedure and treatment not carried out, unspecified reason: Secondary | ICD-10-CM | POA: Insufficient documentation

## 2023-08-13 DIAGNOSIS — J45909 Unspecified asthma, uncomplicated: Secondary | ICD-10-CM | POA: Diagnosis not present

## 2023-08-13 DIAGNOSIS — Z6841 Body Mass Index (BMI) 40.0 and over, adult: Secondary | ICD-10-CM | POA: Diagnosis not present

## 2023-08-13 SURGERY — DENTAL RESTORATION/EXTRACTIONS
Anesthesia: General

## 2023-08-13 MED ORDER — ORAL CARE MOUTH RINSE: 15.0000 mL | Freq: Once | OROMUCOSAL | Status: DC

## 2023-08-13 MED ORDER — CEFAZOLIN SODIUM-DEXTROSE 2-4 GM/100ML-% IV SOLN: 2.0000 g | INTRAVENOUS | Status: DC

## 2023-08-13 MED ORDER — CHLORHEXIDINE GLUCONATE 0.12 % MT SOLN: 15.0000 mL | Freq: Once | OROMUCOSAL | Status: DC

## 2023-08-13 MED ORDER — LACTATED RINGERS IV SOLN: INTRAVENOUS | Status: DC

## 2023-08-13 NOTE — Progress Notes (Signed)
 Pt called out stating that she was having difficulty breathing. Pt states that she has to yawn to catch her breath. VSS. O2 99% on RA. Pt used rescue inhaler to assist with acute attack. Dr. Paul called to room. After further discussion, best plan is to cancel procedure today until asthma is better controlled per anesthesia. Pt reluctantly verbalized understanding. No PIV initiated. Pt to be discharged home with son.   Joesph LOISE Stake, RN

## 2023-08-16 ENCOUNTER — Ambulatory Visit: Payer: Self-pay | Admitting: Family Medicine

## 2023-08-19 ENCOUNTER — Ambulatory Visit

## 2023-08-19 ENCOUNTER — Encounter

## 2023-08-19 ENCOUNTER — Other Ambulatory Visit

## 2023-09-02 NOTE — Progress Notes (Signed)
 Pt attended 06/02/2023 screening event with BP of 153/86 and blood sugar was 104. At event pt did indicate food, housing, & IPV SDOH needs. Pt also noted that she is not a smoker and listed Medicaid as his insurance at the event.   Per initial f/u pt was reached out via phone and pt stated that she was recently able to get food stamps and just moved into an apartment. Pt stated the stamps do not come in for a few more weeks and would like to be sent food resources along with housing resources to assist with rent. Pt verbally consented to being sent resources and was emailed housing & food resources via [place].   Pt also noted during call that she is established with PCP.  Per chart review pt does have a PCP (Corrina Sabin; [facility]), insurance, and is not a smoker. Pt's last appt with PCP was 08/10/2023. Pt does indicate food and housing SDOH needs at this time.  Additional pt f/u to be scheduled at this time per health equity protocol.

## 2023-09-09 NOTE — Progress Notes (Deleted)
 Ellouise Console, PA-C [address] [place], [place]  [address] Phone: [phone]   Gastroenterology Consultation  Referring Provider:     No ref. provider found Primary Care Physician:  Delbert Clam, MD Primary Gastroenterologist:  Ellouise Console, PA-C / Dr. Gordy Starch  Reason for Consultation:     Discussed repeat colonoscopy        HPI:   Briana Wade is a 53 y.o. y/o female referred for consultation & management  by Delbert Clam, MD.    Patient last saw Dr. Starch in our office 09/2017 for follow-up of GERD.  Currently takes omeprazole  40 Mg once daily.  Previous history of adenomatous colon polyps on colonoscopy in 2012.  Last repeat colonoscopy 2015 showed no polyps.  Past history of diverticulitis -last episode confirmed on CT in 2020.  History of IBS and chronic constipation.  Current symptoms:  01/2013 last colonoscopy by Dr. Jakie: Mild diverticulosis. Internal hemorrhoids.  No polyps.  Excellent prep.  01/2013 last EGD by Dr. Jakie: 4 cm hiatal hernia.  Linear erosions and severe free reflux in the esophagus noted.  Evidence of chronic GERD.  Normal stomach and duodenum.  Past Medical History:  Diagnosis Date   Abdominal pain, unspecified site    Allergy     Anemia    Asthma    Chlamydia    Diverticulitis    Esophageal reflux    Hiatal hernia    Hypertension    IBS (irritable bowel syndrome)    Insomnia    Lactose intolerance    Migraine equivalent 08/2014   minor right arm weakness in [facility]. neg CVA on imaging.    Other constipation    Personal history of colonic polyps 09/12/2010   tubular adenoma   Trichimoniasis    Tubular adenoma of colon    Umbilical hernia    Uterine fibroid    Uterine leiomyoma     Past Surgical History:  Procedure Laterality Date   APPENDECTOMY     COLONOSCOPY     DILATION AND CURETTAGE OF UTERUS     x2 for EAB   HYSTEROSCOPY WITH NOVASURE N/A 12/17/2015   Procedure: HYSTEROSCOPY WITH NOVASURE;   Surgeon: Bebe Furry, MD;  Location: [facility];  Service: Gynecology;  Laterality: N/A;   TUBAL LIGATION      Prior to Admission medications   Medication Sig Start Date End Date Taking? Authorizing Provider  albuterol  (VENTOLIN  HFA) 108 (90 Base) MCG/ACT inhaler Inhale 2 puffs into the lungs every 4 (four) hours as needed for wheezing or shortness of breath. 07/06/23   Mayers, Cari S, PA-C  amLODipine  (NORVASC ) 10 MG tablet Take 1 tablet (10 mg total) by mouth daily. 08/10/23   Newlin, Enobong, MD  atorvastatin  (LIPITOR) 10 MG tablet Take 1 tablet (10 mg total) by mouth daily. 06/24/23   Mayers, Cari S, PA-C  budesonide -formoterol  (SYMBICORT ) 160-4.5 MCG/ACT inhaler Inhale 2 puffs into the lungs 2 (two) times daily. 08/10/23   Newlin, Enobong, MD  cetirizine  (ZYRTEC  ALLERGY ) 10 MG tablet Take 1 tablet (10 mg total) by mouth daily. 07/06/23   Mayers, Cari S, PA-C  diphenhydrAMINE  HCl (BENADRYL  ALLERGY  PO) Take 1 tablet by mouth daily as needed (Sleep).    [provider]  fluticasone  (FLONASE ) 50 MCG/ACT nasal spray Place 2 sprays into both nostrils daily. Patient taking differently: Place 2 sprays into both nostrils daily as needed for allergies or rhinitis. 07/06/23   Mayers, Cari S, PA-C  hydrOXYzine  (ATARAX ) 25  MG tablet Take 1 tablet (25 mg total) by mouth at bedtime as needed. 08/10/23   Newlin, Enobong, MD  ibuprofen  (ADVIL ) 600 MG tablet Take 1 tablet (600 mg total) by mouth every 6 (six) hours as needed. 06/23/23   Mayers, Cari S, PA-C  omeprazole  (PRILOSEC) 40 MG capsule Take 40 mg by mouth daily. 07/22/23 07/21/24  [provider]  sertraline  (ZOLOFT ) 50 MG tablet Take 1 tablet (50 mg total) by mouth daily. Patient not taking: Reported on 08/12/2023 07/06/23   Mayers, Kirk RAMAN, PA-C    Family History  Problem Relation Age of Onset   Hypertension Mother    Cancer Mother        not sure of type   Asthma Cousin    Asthma Son    Colon cancer Neg Hx    Esophageal cancer Neg Hx     Rectal cancer Neg Hx    Stomach cancer Neg Hx      Social History   Tobacco Use   Smoking status: Never   Smokeless tobacco: Never  Vaping Use   Vaping status: Never Used  Substance Use Topics   Alcohol use: No   Drug use: No    Allergies as of 09/10/2023 - Reviewed 08/13/2023  Allergen Reaction Noted   Lisinopril Cough 06/02/2022   Lactose intolerance (gi)  06/07/2017    Review of Systems:    All systems reviewed and negative except where noted in HPI.   Physical Exam:  There were no vitals taken for this visit. No LMP recorded. Patient has had an ablation.  General:   Alert,  Well-developed, well-nourished, pleasant and cooperative in NAD Lungs:  Respirations even and unlabored.  Clear throughout to auscultation.   No wheezes, crackles, or rhonchi. No acute distress. Heart:  Regular rate and rhythm; no murmurs, clicks, rubs, or gallops. Abdomen:  Normal bowel sounds.  No bruits.  Soft, and non-distended without masses, hepatosplenomegaly or hernias noted.  No Tenderness.  No guarding or rebound tenderness.    Neurologic:  Alert and oriented x3;  grossly normal neurologically. Psych:  Alert and cooperative. Normal mood and affect.  Imaging Studies: US  THYROID  Result Date: 08/15/2023 CLINICAL DATA:  anterior neck swelling, concern for thyroid  enlargement EXAM: THYROID  ULTRASOUND TECHNIQUE: Ultrasound examination of the thyroid  gland and adjacent soft tissues was performed. COMPARISON:  None Available. FINDINGS: Parenchymal Echotexture: Moderately heterogenous Isthmus: 8 mm Right lobe: 4.2 x 2.0 x 1.9 cm Left lobe: 3.6 x 1.7 x 1.6 cm _________________________________________________________ Estimated total number of nodules >/= 1 cm: 0 Number of spongiform nodules >/=  2 cm not described below (TR1): 0 Number of mixed cystic and solid nodules >/= 1.5 cm not described below (TR2): 0 _________________________________________________________ Nonspecific diffuse thyroid   heterogeneity without hypervascularity. No focal abnormality, nodule or mass. Overall normal gland size. No regional adenopathy. IMPRESSION: Nonspecific diffuse thyroid  heterogeneity. No focal abnormality, nodule or mass. Electronically Signed   By: CHRISTELLA.  Shick M.D.   On: 08/15/2023 18:38    Labs: CBC    Component Value Date/Time   WBC 4.9 06/23/2023 1303   WBC 6.0 04/13/2023 1450   RBC 4.69 06/23/2023 1303   RBC 4.56 04/13/2023 1450   HGB 12.8 06/23/2023 1303   HCT 39.8 06/23/2023 1303   PLT 280 06/23/2023 1303   MCV 85 06/23/2023 1303    CMP     Component Value Date/Time   NA 141 06/23/2023 1303   K 4.0 06/23/2023 1303   CL 100 06/23/2023  1303   CO2 27 04/13/2023 1450   GLUCOSE 78 06/23/2023 1303   GLUCOSE 112 (H) 04/13/2023 1450   BUN 8 06/23/2023 1303   CREATININE 0.77 06/23/2023 1303   CALCIUM  9.4 06/23/2023 1303   PROT 7.2 06/23/2023 1303   ALBUMIN 4.4 06/23/2023 1303   AST 16 06/23/2023 1303   ALT 20 06/01/2022 0329   ALKPHOS 124 (H) 06/23/2023 1303   BILITOT 0.4 06/23/2023 1303   GFRNONAA >60 04/13/2023 1450   GFRAA >60 02/07/2018 1101    Assessment and Plan:   Briana Wade is a 53 y.o. y/o female has been referred for ***  Follow up ***  Ellouise Console, PA-C

## 2023-09-10 ENCOUNTER — Ambulatory Visit: Admitting: Physician Assistant

## 2023-09-16 ENCOUNTER — Telehealth: Payer: Self-pay | Admitting: Family Medicine

## 2023-09-16 NOTE — Telephone Encounter (Signed)
 Called patient and left message about scheduling from referral.

## 2023-09-17 ENCOUNTER — Ambulatory Visit (INDEPENDENT_AMBULATORY_CARE_PROVIDER_SITE_OTHER)

## 2023-09-17 ENCOUNTER — Other Ambulatory Visit: Payer: Self-pay

## 2023-09-17 ENCOUNTER — Ambulatory Visit (HOSPITAL_COMMUNITY)
Admission: EM | Admit: 2023-09-17 | Discharge: 2023-09-17 | Disposition: A | Discharge status: Home / Self Care | Attending: Emergency Medicine | Admitting: Emergency Medicine

## 2023-09-17 ENCOUNTER — Encounter (HOSPITAL_COMMUNITY): Payer: Self-pay

## 2023-09-17 DIAGNOSIS — R079 Chest pain, unspecified: Secondary | ICD-10-CM | POA: Diagnosis not present

## 2023-09-17 NOTE — Discharge Instructions (Addendum)
 Staff will call you if there is anything abnormal on your xray. I have placed a referral to cardiology. They will call you to make an appointment  Please go to the emergency department if symptoms worsen. If you develop chest pain, worsening chest pressure, shortness of breath, abdominal pain, nausea and vomiting, dizziness, weakness, etc

## 2023-09-17 NOTE — ED Provider Notes (Signed)
 [facility]    CSN: [id number] Arrival date & time: 09/17/23  1629      History   Chief Complaint Chief Complaint  Patient presents with   Chest Pain    HPI Briana Wade is a 53 y.o. female.  Patient here with symptoms she is unable to describe She is a poor historian Reports about a week of generalized chest discomfort. She denies pain sensation. Comes and goes. About 6/10 at its worst. She has been having chest pain on and off for a long time. Has seen her PCP about this. She maybe feels short of breath sometimes. When describing her chest pain she points to the entire chest area, and her epigastric area.  She's had swelling in her legs on and off as well This episode of swelling for about a week. Legs feel heavy  Has asthma. Denies any wheezing HTN. On amlodipine    Denies HF history   Past Medical History:  Diagnosis Date   Abdominal pain, unspecified site    Allergy     Anemia    Asthma    Chlamydia    Diverticulitis    Esophageal reflux    Hiatal hernia    Hypertension    IBS (irritable bowel syndrome)    Insomnia    Lactose intolerance    Migraine equivalent 08/2014   minor right arm weakness in [facility]. neg CVA on imaging.    Other constipation    Personal history of colonic polyps 09/12/2010   tubular adenoma   Trichimoniasis    Tubular adenoma of colon    Umbilical hernia    Uterine fibroid    Uterine leiomyoma     Patient Active Problem List   Diagnosis Date Noted   Moderate episode of recurrent major depressive disorder (HCC) 07/06/2023   Unsheltered homelessness 07/06/2023   Asthma exacerbation 06/03/2022   Acute asthma exacerbation 06/01/2022   Class II obesity 06/01/2022   Hypocalcemia 06/01/2022   Chest pain 06/01/2022   Diverticulosis of sigmoid colon 04/27/2016   Acute diverticulitis of intestine 04/27/2016   Diverticula of colon 05/21/2015   Asthma 05/21/2015   Acute diverticulitis 05/21/2015   Diverticulitis  of sigmoid colon 06/02/2011   Nausea and vomiting 06/02/2011   Otitis 06/02/2011   HTN (hypertension) 06/02/2011   GERD (gastroesophageal reflux disease) 10/21/2010   Gluten intolerance 10/21/2010   History of colonic polyps 10/21/2010   Flatulence, eructation, and gas pain 09/12/2010   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   Irritable bowel syndrome 09/12/2010   Abdominal pain 08/26/2010   Bloating 08/26/2010   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   IBS (irritable bowel syndrome) 08/26/2010   CONSTIPATION, CHRONIC 05/11/2007   Abdominal pain 05/11/2007    Past Surgical History:  Procedure Laterality Date   APPENDECTOMY     COLONOSCOPY     DILATION AND CURETTAGE OF UTERUS     x2 for EAB   HYSTEROSCOPY WITH NOVASURE N/A 12/17/2015   Procedure: HYSTEROSCOPY WITH NOVASURE;  Surgeon: Bebe Furry, MD;  Location: [facility];  Service: Gynecology;  Laterality: N/A;   TUBAL LIGATION      OB History     Gravida  7   Para  3   Term  3   Preterm  0   AB  4   Living         SAB  1   IAB  3   Ectopic  0   Multiple      Live  Births           Obstetric Comments  svd x 3          Home Medications    Prior to Admission medications   Medication Sig Start Date End Date Taking? Authorizing Provider  albuterol  (VENTOLIN  HFA) 108 (90 Base) MCG/ACT inhaler Inhale 2 puffs into the lungs every 4 (four) hours as needed for wheezing or shortness of breath. 07/06/23   Mayers, Cari S, PA-C  amLODipine  (NORVASC ) 10 MG tablet Take 1 tablet (10 mg total) by mouth daily. 08/10/23   Newlin, Enobong, MD  atorvastatin  (LIPITOR) 10 MG tablet Take 1 tablet (10 mg total) by mouth daily. 06/24/23   Mayers, Cari S, PA-C  budesonide -formoterol  (SYMBICORT ) 160-4.5 MCG/ACT inhaler Inhale 2 puffs into the lungs 2 (two) times daily. 08/10/23   Newlin, Enobong, MD  cetirizine  (ZYRTEC  ALLERGY ) 10 MG tablet Take 1 tablet (10 mg total) by mouth  daily. 07/06/23   Mayers, Cari S, PA-C  diphenhydrAMINE  HCl (BENADRYL  ALLERGY  PO) Take 1 tablet by mouth daily as needed (Sleep).    [provider]  fluticasone  (FLONASE ) 50 MCG/ACT nasal spray Place 2 sprays into both nostrils daily. Patient taking differently: Place 2 sprays into both nostrils daily as needed for allergies or rhinitis. 07/06/23   Mayers, Cari S, PA-C  hydrOXYzine  (ATARAX ) 25 MG tablet Take 1 tablet (25 mg total) by mouth at bedtime as needed. 08/10/23   Newlin, Enobong, MD  ibuprofen  (ADVIL ) 600 MG tablet Take 1 tablet (600 mg total) by mouth every 6 (six) hours as needed. 06/23/23   Mayers, Cari S, PA-C  omeprazole  (PRILOSEC) 40 MG capsule Take 40 mg by mouth daily. 07/22/23 07/21/24  [provider]  sertraline  (ZOLOFT ) 50 MG tablet Take 1 tablet (50 mg total) by mouth daily. 07/06/23   Mayers, Kirk RAMAN, PA-C    Family History Family History  Problem Relation Age of Onset   Hypertension Mother    Cancer Mother        not sure of type   Asthma Cousin    Asthma Son    Colon cancer Neg Hx    Esophageal cancer Neg Hx    Rectal cancer Neg Hx    Stomach cancer Neg Hx     Social History Social History   Tobacco Use   Smoking status: Never   Smokeless tobacco: Never  Vaping Use   Vaping status: Never Used  Substance Use Topics   Alcohol use: No   Drug use: No     Allergies   Lisinopril and Lactose intolerance (gi)   Review of Systems Review of Systems As per HPI   Physical Exam Triage Vital Signs ED Triage Vitals  Encounter Vitals Group     BP 09/17/23 1721 (!) 147/102     Girls Systolic BP Percentile --      Girls Diastolic BP Percentile --      Boys Systolic BP Percentile --      Boys Diastolic BP Percentile --      Pulse Rate 09/17/23 1721 95     Resp 09/17/23 1721 18     Temp 09/17/23 1721 99.1 F (37.3 C)     Temp Source 09/17/23 1721 Oral     SpO2 09/17/23 1721 100 %     Weight --      Height --      Head Circumference --       Peak Flow --  Pain Score 09/17/23 1724 6     Pain Loc --      Pain Education --      Exclude from Growth Chart --    No data found.  Updated Vital Signs BP (!) 157/96 (BP Location: Left Arm)   Pulse 88   Temp 98.3 F (36.8 C) (Oral)   Resp 16   LMP  (LMP Unknown)   SpO2 98%   Visual Acuity Right Eye Distance:   Left Eye Distance:   Bilateral Distance:    Right Eye Near:   Left Eye Near:    Bilateral Near:     Physical Exam Vitals and nursing note reviewed.  Constitutional:      General: She is not in acute distress.    Appearance: She is not ill-appearing.  HENT:     Head: Atraumatic.     Mouth/Throat:     Mouth: Mucous membranes are moist.     Pharynx: Oropharynx is clear.  Eyes:     Extraocular Movements: Extraocular movements intact.     Right eye: No nystagmus.     Left eye: No nystagmus.     Conjunctiva/sclera: Conjunctivae normal.     Pupils: Pupils are equal, round, and reactive to light.  Cardiovascular:     Rate and Rhythm: Normal rate and regular rhythm.     Pulses: Normal pulses.          Radial pulses are 2+ on the right side and 2+ on the left side.       Dorsalis pedis pulses are 2+ on the right side and 2+ on the left side.     Heart sounds: Normal heart sounds.     Comments: 1+ edema in bilat lower extremities, ankles to mid-shin. Not pitting  Pulmonary:     Effort: Pulmonary effort is normal.     Breath sounds: Normal breath sounds.  Abdominal:     Tenderness: There is no abdominal tenderness.     Comments: Habitus limits exam  Musculoskeletal:        General: Normal range of motion.     Cervical back: Normal range of motion.     Right lower leg: 1+ Edema present.     Left lower leg: 1+ Edema present.  Neurological:     General: No focal deficit present.     Mental Status: She is alert and oriented to person, place, and time.     Cranial Nerves: Cranial nerves 2-12 are intact. No cranial nerve deficit.     Sensory: No sensory  deficit.     Motor: No weakness or pronator drift.     Coordination: Romberg sign negative. Coordination normal.     Gait: Gait normal.      UC Treatments / Results  Labs (all labs ordered are listed, but only abnormal results are displayed) Labs Reviewed - No data to display  EKG   Radiology DG Chest 2 View Result Date: 09/17/2023 CLINICAL DATA:  Chest pain.  Shortness of breath. EXAM: CHEST - 2 VIEW COMPARISON:  04/13/2023. FINDINGS: The heart size and mediastinal contours are within normal limits. No focal consolidation, pleural effusion, or pneumothorax. No acute osseous abnormality. IMPRESSION: No acute cardiopulmonary findings. Electronically Signed   By: Harrietta Sherry M.D.   On: 09/17/2023 19:22    Procedures Procedures (including critical care time)  Medications Ordered in UC Medications - No data to display  Initial Impression / Assessment and Plan / UC Course  I have reviewed  the triage vital signs and the nursing notes.  Pertinent labs & imaging results that were available during my care of the patient were reviewed by me and considered in my medical decision making (see chart for details).  Vague symptoms that patient cannot further describe Vitals are overall stable. BP elevation 157/96 Neurologically intact  EKG normal sinus, ventricular rate of 95 bpm. No ST or T wave abnormality noted. Compared with prior readings from 03/2023 and 05/2022  At this time, no red flags. Discussed with week of symptoms, and intermittent for several months, less concern about acute cardiac issue. Chest xray obtained. I have placed a referral to cardiology.  Patient reports she is leaving the clinic now because her son is locked out of the house. Before she left, we had discussed any new or different symptoms including chest pain, shortness of breath, weakness, needs to be evaluated in the emergency department. She was agreeable to plan.  CXR results after patient had left. No  cardiopulmonary abnormality. Images independently reviewed by me, agree with radiology interpretation.  Final Clinical Impressions(s) / UC Diagnoses   Final diagnoses:  Chest pain, unspecified type     Discharge Instructions      Staff will call you if there is anything abnormal on your xray. I have placed a referral to cardiology. They will call you to make an appointment  Please go to the emergency department if symptoms worsen. If you develop chest pain, worsening chest pressure, shortness of breath, abdominal pain, nausea and vomiting, dizziness, weakness, etc     ED Prescriptions   None    PDMP not reviewed this encounter.   Audrena Talaga, PA-C 09/17/23 2044

## 2023-09-17 NOTE — ED Triage Notes (Signed)
 Patient here today with c/o left side chest pains and swelling in her legs X 1 week. Patient takes Amlodipine .

## 2023-09-28 ENCOUNTER — Encounter: Payer: Self-pay | Admitting: Obstetrics and Gynecology

## 2023-10-07 ENCOUNTER — Ambulatory Visit
Admission: RE | Admit: 2023-10-07 | Discharge: 2023-10-07 | Disposition: A | Discharge status: Home / Self Care | Source: Ambulatory Visit | Attending: Obstetrics and Gynecology | Admitting: Obstetrics and Gynecology

## 2023-10-07 DIAGNOSIS — N6310 Unspecified lump in the right breast, unspecified quadrant: Secondary | ICD-10-CM

## 2023-10-15 ENCOUNTER — Encounter (HOSPITAL_COMMUNITY): Payer: Self-pay

## 2023-10-15 ENCOUNTER — Ambulatory Visit (HOSPITAL_COMMUNITY)
Admission: EM | Admit: 2023-10-15 | Discharge: 2023-10-15 | Disposition: A | Discharge status: Home / Self Care | Attending: Internal Medicine | Admitting: Internal Medicine

## 2023-10-15 DIAGNOSIS — R0789 Other chest pain: Secondary | ICD-10-CM

## 2023-10-15 MED ORDER — CYCLOBENZAPRINE HCL 5 MG PO TABS: 5.0000 mg | ORAL_TABLET | Freq: Three times a day (TID) | ORAL | 0 refills | Status: AC | PRN

## 2023-10-15 MED ORDER — PREDNISONE 20 MG PO TABS: 40.0000 mg | ORAL_TABLET | Freq: Every day | ORAL | 0 refills | Status: AC

## 2023-10-15 NOTE — ED Provider Notes (Signed)
 [facility]    CSN: [id number] Arrival date & time: 10/15/23  1948      History   Chief Complaint No chief complaint on file.   HPI Briana Wade is a 53 y.o. female.   53 year old female presents urgent care with persistent chest pain.  She reports that this has been going on for months.  She has seen urgent care as well as her primary care provider in the past for this.  She reports that every time she comes in she gets an EKG which does not show any changes.  The chest pain is over her right breast.  She has had a recent mammogram which did not show any acute findings the breast.  She is very frustrated by this.  The chest pain has not changed recently.  It has not worsened today.     Past Medical History:  Diagnosis Date   Abdominal pain, unspecified site    Allergy     Anemia    Asthma    Chlamydia    Diverticulitis    Esophageal reflux    Hiatal hernia    Hypertension    IBS (irritable bowel syndrome)    Insomnia    Lactose intolerance    Migraine equivalent 08/2014   minor right arm weakness in [facility]. neg CVA on imaging.    Other constipation    Personal history of colonic polyps 09/12/2010   tubular adenoma   Trichimoniasis    Tubular adenoma of colon    Umbilical hernia    Uterine fibroid    Uterine leiomyoma     Patient Active Problem List   Diagnosis Date Noted   Moderate episode of recurrent major depressive disorder (HCC) 07/06/2023   Unsheltered homelessness 07/06/2023   Asthma exacerbation 06/03/2022   Acute asthma exacerbation 06/01/2022   Class II obesity 06/01/2022   Hypocalcemia 06/01/2022   Chest pain 06/01/2022   Diverticulosis of sigmoid colon 04/27/2016   Acute diverticulitis of intestine 04/27/2016   Diverticula of colon 05/21/2015   Asthma 05/21/2015   Acute diverticulitis 05/21/2015   Diverticulitis of sigmoid colon 06/02/2011   Nausea and vomiting 06/02/2011   Otitis 06/02/2011   HTN (hypertension)  06/02/2011   GERD (gastroesophageal reflux disease) 10/21/2010   Gluten intolerance 10/21/2010   History of colonic polyps 10/21/2010   Flatulence, eructation, and gas pain 09/12/2010   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   Irritable bowel syndrome 09/12/2010   Abdominal pain 08/26/2010   Bloating 08/26/2010   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   IBS (irritable bowel syndrome) 08/26/2010   CONSTIPATION, CHRONIC 05/11/2007   Abdominal pain 05/11/2007    Past Surgical History:  Procedure Laterality Date   APPENDECTOMY     COLONOSCOPY     DILATION AND CURETTAGE OF UTERUS     x2 for EAB   HYSTEROSCOPY WITH NOVASURE N/A 12/17/2015   Procedure: HYSTEROSCOPY WITH NOVASURE;  Surgeon: Bebe Furry, MD;  Location: [facility];  Service: Gynecology;  Laterality: N/A;   TUBAL LIGATION      OB History     Gravida  7   Para  3   Term  3   Preterm  0   AB  4   Living         SAB  1   IAB  3   Ectopic  0   Multiple      Live Births           Obstetric Comments  svd x 3          Home Medications    Prior to Admission medications   Medication Sig Start Date End Date Taking? Authorizing Provider  albuterol  (VENTOLIN  HFA) 108 (90 Base) MCG/ACT inhaler Inhale 2 puffs into the lungs every 4 (four) hours as needed for wheezing or shortness of breath. 07/06/23  Yes Mayers, Cari S, PA-C  amLODipine  (NORVASC ) 10 MG tablet Take 1 tablet (10 mg total) by mouth daily. 08/10/23  Yes Newlin, Enobong, MD  atorvastatin  (LIPITOR) 10 MG tablet Take 1 tablet (10 mg total) by mouth daily. 06/24/23  Yes Mayers, Cari S, PA-C  budesonide -formoterol  (SYMBICORT ) 160-4.5 MCG/ACT inhaler Inhale 2 puffs into the lungs 2 (two) times daily. 08/10/23  Yes Newlin, Enobong, MD  cetirizine  (ZYRTEC  ALLERGY ) 10 MG tablet Take 1 tablet (10 mg total) by mouth daily. 07/06/23  Yes Mayers, Cari S, PA-C  cyclobenzaprine  (FLEXERIL ) 5 MG tablet Take 1 tablet  (5 mg total) by mouth every 8 (eight) hours as needed for muscle spasms. 10/15/23  Yes Elham Fini A, PA-C  diphenhydrAMINE  HCl (BENADRYL  ALLERGY  PO) Take 1 tablet by mouth daily as needed (Sleep).   Yes [provider]  fluticasone  (FLONASE ) 50 MCG/ACT nasal spray Place 2 sprays into both nostrils daily. Patient taking differently: Place 2 sprays into both nostrils daily as needed for allergies or rhinitis. 07/06/23  Yes Mayers, Cari S, PA-C  hydrOXYzine  (ATARAX ) 25 MG tablet Take 1 tablet (25 mg total) by mouth at bedtime as needed. 08/10/23  Yes Newlin, Enobong, MD  ibuprofen  (ADVIL ) 600 MG tablet Take 1 tablet (600 mg total) by mouth every 6 (six) hours as needed. 06/23/23  Yes Mayers, Cari S, PA-C  predniSONE  (DELTASONE ) 20 MG tablet Take 2 tablets (40 mg total) by mouth daily with breakfast for 5 days. 10/15/23 10/20/23 Yes Arial Galligan A, PA-C  sertraline  (ZOLOFT ) 50 MG tablet Take 1 tablet (50 mg total) by mouth daily. 07/06/23  Yes Mayers, Cari S, PA-C  omeprazole  (PRILOSEC) 40 MG capsule Take 40 mg by mouth daily. 07/22/23 07/21/24  [provider]    Family History Family History  Problem Relation Age of Onset   Hypertension Mother    Cancer Mother        not sure of type   Asthma Cousin    Asthma Son    Colon cancer Neg Hx    Esophageal cancer Neg Hx    Rectal cancer Neg Hx    Stomach cancer Neg Hx     Social History Social History   Tobacco Use   Smoking status: Never   Smokeless tobacco: Never  Vaping Use   Vaping status: Never Used  Substance Use Topics   Alcohol use: No   Drug use: No     Allergies   Lisinopril and Lactose intolerance (gi)   Review of Systems Review of Systems  Constitutional:  Negative for chills and fever.  HENT:  Negative for ear pain and sore throat.   Eyes:  Negative for pain and visual disturbance.  Respiratory:  Negative for cough and shortness of breath.   Cardiovascular:  Positive for chest pain. Negative for  palpitations.  Gastrointestinal:  Negative for abdominal pain and vomiting.  Genitourinary:  Negative for dysuria and hematuria.  Musculoskeletal:  Negative for arthralgias and back pain.  Skin:  Negative for color change and rash.  Neurological:  Negative for seizures and syncope.  All other systems reviewed and are negative.  Physical Exam Triage Vital Signs ED Triage Vitals [10/15/23 1958]  Encounter Vitals Group     BP (!) 160/83     Girls Systolic BP Percentile      Girls Diastolic BP Percentile      Boys Systolic BP Percentile      Boys Diastolic BP Percentile      Pulse Rate 97     Resp 20     Temp 98.3 F (36.8 C)     Temp Source Oral     SpO2 98 %     Weight      Height      Head Circumference      Peak Flow      Pain Score      Pain Loc      Pain Education      Exclude from Growth Chart    No data found.  Updated Vital Signs BP (!) 160/83 (BP Location: Right Arm)   Pulse 97   Temp 98.3 F (36.8 C) (Oral)   Resp 20   LMP  (LMP Unknown)   SpO2 98%   Visual Acuity Right Eye Distance:   Left Eye Distance:   Bilateral Distance:    Right Eye Near:   Left Eye Near:    Bilateral Near:     Physical Exam Vitals and nursing note reviewed.  Constitutional:      General: She is not in acute distress.    Appearance: She is well-developed.  HENT:     Head: Normocephalic and atraumatic.  Eyes:     Conjunctiva/sclera: Conjunctivae normal.  Cardiovascular:     Rate and Rhythm: Normal rate and regular rhythm.     Heart sounds: No murmur heard. Pulmonary:     Effort: Pulmonary effort is normal. No respiratory distress.     Breath sounds: Normal breath sounds.  Abdominal:     Palpations: Abdomen is soft.     Tenderness: There is no abdominal tenderness.  Musculoskeletal:        General: No swelling.     Cervical back: Neck supple.  Skin:    General: Skin is warm and dry.     Capillary Refill: Capillary refill takes less than 2 seconds.   Neurological:     Mental Status: She is alert.  Psychiatric:        Mood and Affect: Mood normal.      UC Treatments / Results  Labs (all labs ordered are listed, but only abnormal results are displayed) Labs Reviewed  COMPREHENSIVE METABOLIC PANEL WITH GFR  CBC    EKG   Radiology No results found.  Procedures Procedures (including critical care time)  Medications Ordered in UC Medications - No data to display  Initial Impression / Assessment and Plan / UC Course  I have reviewed the triage vital signs and the nursing notes.  Pertinent labs & imaging results that were available during my care of the patient were reviewed by me and considered in my medical decision making (see chart for details).     Other chest pain  Persistent chest pain.  EKG done today shows no acute changes.  Physical exam and vital signs are reassuring.  Chest pain could be from multiple sources and likely will need a more in-depth workup through your primary care provider.  We have drawn labs today including complete blood count and complete metabolic panel.  These will take approximately 24 hours for final results.  If there is any abnormalities that  need further treatment we will contact you.  Symptoms could be from a musculoskeletal source and we can try a course of prednisone  as well as a muscle relaxer to see if this will improve symptoms.  Recommend contacting your primary care provider and schedule an appointment.  We have placed a referral and attached the information regarding cardiology.  You will need to contact them on Monday to schedule an appointment.  We will treat today with the following: Prednisone  40 mg (2 tablets) once daily for 5 days. Take this in the morning.  This is a steroid to help with inflammation and pain. Flexeril  5 mg every 8 hours as needed for muscle spasms.  Use caution as this medication can cause drowsiness. Can try using moist heat on the area to see if this  improves symptoms If symptoms worsen then recommend going to the emergency room where more advanced workup can be obtained   Final Clinical Impressions(s) / UC Diagnoses   Final diagnoses:  Other chest pain     Discharge Instructions      Persistent chest pain.  EKG done today shows no acute changes.  Chest pain could be from multiple sources and likely will need a more in-depth workup through your primary care provider.  We have drawn labs today including complete blood count and complete metabolic panel.  These will take approximately 24 hours for final results.  If there is any abnormalities that need further treatment we will contact you.  Symptoms could be from a musculoskeletal source and we can try a course of prednisone  as well as a muscle relaxer to see if this will improve symptoms.  Recommend contacting your primary care provider and schedule an appointment.  We have placed a referral and attached the information regarding cardiology.  You will need to contact them on Monday to schedule an appointment.  We will treat today with the following: Prednisone  40 mg (2 tablets) once daily for 5 days. Take this in the morning.  This is a steroid to help with inflammation and pain. Flexeril  5 mg every 8 hours as needed for muscle spasms.  Use caution as this medication can cause drowsiness. Can try using moist heat on the area to see if this improves symptoms If symptoms worsen then recommend going to the emergency room where more advanced workup can be obtained    ED Prescriptions     Medication Sig Dispense Auth. Provider   cyclobenzaprine  (FLEXERIL ) 5 MG tablet Take 1 tablet (5 mg total) by mouth every 8 (eight) hours as needed for muscle spasms. 30 tablet Meet [place] A, PA-C   predniSONE  (DELTASONE ) 20 MG tablet Take 2 tablets (40 mg total) by mouth daily with breakfast for 5 days. 10 tablet Teresa Almarie LABOR, [place]      PDMP not reviewed this encounter.   Avleen, [place], [place] 10/15/23 2026

## 2023-10-15 NOTE — ED Triage Notes (Signed)
 Patient presents with right side chest pain that started earlier today. Patient states her chest pain is a recurring issue for her.

## 2023-10-15 NOTE — Discharge Instructions (Addendum)
 Persistent chest pain.  EKG done today shows no acute changes.  Chest pain could be from multiple sources and likely will need a more in-depth workup through your primary care provider.  We have attempted to draw labs today including complete blood count and complete metabolic panel but we were unable to get a vein for this. Symptoms could be from a musculoskeletal source and we can try a course of prednisone  as well as a muscle relaxer to see if this will improve symptoms.  Recommend contacting your primary care provider and schedule an appointment.  We have placed a referral and attached the information regarding cardiology.  You will need to contact them on Monday to schedule an appointment.  We will treat today with the following: Prednisone  40 mg (2 tablets) once daily for 5 days. Take this in the morning.  This is a steroid to help with inflammation and pain. Flexeril  5 mg every 8 hours as needed for muscle spasms.  Use caution as this medication can cause drowsiness. Can try using moist heat on the area to see if this improves symptoms If symptoms worsen then recommend going to the emergency room where more advanced workup can be obtained

## 2023-10-22 NOTE — Progress Notes (Signed)
 The patient attended a screening event on 06/02/2023 where her screening results were a BP of 153/86 and a blood glucose of 104. At the event the patient noted she is not a smoker, did not list a PCP, and listed Medicaid for insurance. Patient indicated food, housing, and IPV SDOH needs at the time of the event.   Per chart review (patient does have a PCP (Corrina Sabin, MD) and has Medicaid for insurance. The patients last appt with her PCP was on 08/10/2023 and no future appt at this time. Patient has food, housing, and IPV SDOH needs.  CHW attempted to reach patient via phone, vm left for patient to call back. Letter along with food and housing resources mailed. An additional follow up will be done in according to the health equity team's protocol.

## 2023-11-09 ENCOUNTER — Ambulatory Visit: Admitting: Physician Assistant

## 2023-11-09 ENCOUNTER — Encounter: Payer: Self-pay | Admitting: Physician Assistant

## 2023-11-09 VITALS — BP 130/82 | HR 100 | Ht 60.0 in | Wt 227.4 lb

## 2023-11-09 DIAGNOSIS — R1013 Epigastric pain: Secondary | ICD-10-CM

## 2023-11-09 DIAGNOSIS — K219 Gastro-esophageal reflux disease without esophagitis: Secondary | ICD-10-CM

## 2023-11-09 DIAGNOSIS — Z1211 Encounter for screening for malignant neoplasm of colon: Secondary | ICD-10-CM

## 2023-11-09 DIAGNOSIS — K589 Irritable bowel syndrome without diarrhea: Secondary | ICD-10-CM

## 2023-11-09 NOTE — Progress Notes (Signed)
 Ellouise Console, PA-C [address] [place], [place]  [address] Phone: [phone]   Gastroenterology Consultation  Referring Provider:     Delbert Clam, MD Primary Care Physician:  Delbert Clam, MD Primary Gastroenterologist:  Ellouise Console, PA-C / Dr. Gordy Starch  Reason for Consultation:     GERD.        HPI:   Tonetta Napoles is a 53 y.o. y/o female referred for consultation & management  by Newlin, Enobong, MD.    Patient is here to discuss acid reflux, chronic cough, globus sensation, epigastric pain.  She had an episode of dark stool a year ago but none since.  Denies any recent rectal bleeding.  She would like to talk about repeat colonoscopy and EGD.  She reports having EGD done a year ago at [facility] medical [facility] in [facility].  Results are unavailable.  Has had multiple ED visits for noncardiac chest pain and diagnosed with GERD.  She has been on omeprazole , pantoprazole , and Prevacid sporadically.  Not currently taking any PPIs.  Has not given consistent with medication.  She denies dysphagia.  She had a negative colonoscopy through our office in 2015.  She reports having another colonoscopy at a different GI office in [place] since then.  We are requesting GI records from other GI offices.  01/2013 EGD by Dr. Jakie: 4 cm Hiatal Hernia; GERD with Erosive Esophagitis.  Normal duodenum and stomach.  01/2013 Colonoscopy: Mild Diverticulosis.  No polyps.  Incidental internal hemorrhoids.  10 year repeat.  Past Medical History:  Diagnosis Date   Abdominal pain, unspecified site    Allergy     Anemia    Asthma    Chlamydia    Diverticulitis    Esophageal reflux    Hiatal hernia    Hypertension    IBS (irritable bowel syndrome)    Insomnia    Lactose intolerance    Migraine equivalent 08/2014   minor right arm weakness in [facility]. neg CVA on imaging.    Other constipation    Personal history of colonic polyps 09/12/2010   tubular adenoma    Trichimoniasis    Tubular adenoma of colon    Umbilical hernia    Uterine fibroid    Uterine leiomyoma     Past Surgical History:  Procedure Laterality Date   APPENDECTOMY     COLONOSCOPY     DILATION AND CURETTAGE OF UTERUS     x2 for EAB   HYSTEROSCOPY WITH NOVASURE N/A 12/17/2015   Procedure: HYSTEROSCOPY WITH NOVASURE;  Surgeon: Bebe Furry, MD;  Location: [facility];  Service: Gynecology;  Laterality: N/A;   TUBAL LIGATION      Prior to Admission medications   Medication Sig Start Date End Date Taking? Authorizing Provider  albuterol  (VENTOLIN  HFA) 108 (90 Base) MCG/ACT inhaler Inhale 2 puffs into the lungs every 4 (four) hours as needed for wheezing or shortness of breath. 07/06/23   Mayers, Cari S, PA-C  amLODipine  (NORVASC ) 10 MG tablet Take 1 tablet (10 mg total) by mouth daily. 08/10/23   Newlin, Enobong, MD  atorvastatin  (LIPITOR) 10 MG tablet Take 1 tablet (10 mg total) by mouth daily. 06/24/23   Mayers, Cari S, PA-C  budesonide -formoterol  (SYMBICORT ) 160-4.5 MCG/ACT inhaler Inhale 2 puffs into the lungs 2 (two) times daily. 08/10/23   Newlin, Enobong, MD  cetirizine  (ZYRTEC  ALLERGY ) 10 MG tablet Take 1 tablet (10 mg total) by mouth daily. 07/06/23   Mayers, Kirk RAMAN, PA-C  cyclobenzaprine  (FLEXERIL ) 5 MG tablet Take 1 tablet (5 mg total) by mouth every 8 (eight) hours as needed for muscle spasms. 10/15/23   Sinatra, Elizabeth A, PA-C  diphenhydrAMINE  HCl (BENADRYL  ALLERGY  PO) Take 1 tablet by mouth daily as needed (Sleep).    [provider]  fluticasone  (FLONASE ) 50 MCG/ACT nasal spray Place 2 sprays into both nostrils daily. Patient taking differently: Place 2 sprays into both nostrils daily as needed for allergies or rhinitis. 07/06/23   Mayers, Cari S, PA-C  hydrOXYzine  (ATARAX ) 25 MG tablet Take 1 tablet (25 mg total) by mouth at bedtime as needed. 08/10/23   Newlin, Enobong, MD  ibuprofen  (ADVIL ) 600 MG tablet Take 1 tablet (600 mg total) by mouth every 6 (six) hours as  needed. 06/23/23   Mayers, Cari S, PA-C  omeprazole  (PRILOSEC) 40 MG capsule Take 40 mg by mouth daily. 07/22/23 07/21/24  [provider]  sertraline  (ZOLOFT ) 50 MG tablet Take 1 tablet (50 mg total) by mouth daily. 07/06/23   Mayers, Kirk RAMAN, PA-C    Family History  Problem Relation Age of Onset   Hypertension Mother    Pancreatic cancer Mother    Asthma Son    Asthma Cousin    Colon cancer Neg Hx    Esophageal cancer Neg Hx    Rectal cancer Neg Hx    Stomach cancer Neg Hx      Social History   Tobacco Use   Smoking status: Never   Smokeless tobacco: Never  Vaping Use   Vaping status: Never Used  Substance Use Topics   Alcohol use: No   Drug use: No    Allergies as of 11/09/2023 - Review Complete 11/09/2023  Allergen Reaction Noted   Lisinopril Cough 06/02/2022   Lactose intolerance (gi)  06/07/2017    Review of Systems:    All systems reviewed and negative except where noted in HPI.   Physical Exam:  BP 130/82 (BP Location: Left Arm, Patient Position: Sitting, Cuff Size: Large)   Pulse 100   Ht 5' (1.524 m) Comment: height measured without shoes  Wt 227 lb 6 oz (103.1 kg)   BMI 44.41 kg/m  No LMP recorded. Patient has had an ablation.  General:   Alert,  Well-developed, well-nourished, pleasant and cooperative in NAD Lungs:  Respirations even and unlabored.  Clear throughout to auscultation.   No wheezes, crackles, or rhonchi. No acute distress. Heart:  Regular rate and rhythm; no murmurs, clicks, rubs, or gallops. Abdomen:  Normal bowel sounds.  No bruits.  Soft, and non-distended without masses, hepatosplenomegaly or hernias noted.  No Tenderness.  No guarding or rebound tenderness.    Neurologic:  Alert and oriented x3;  grossly normal neurologically. Psych:  Alert and cooperative. Normal mood and affect.  Imaging Studies: No results found.  Labs: CBC    Component Value Date/Time   WBC 4.9 06/23/2023 1303   WBC 6.0 04/13/2023 1450   RBC 4.69  06/23/2023 1303   RBC 4.56 04/13/2023 1450   HGB 12.8 06/23/2023 1303   HCT 39.8 06/23/2023 1303   PLT 280 06/23/2023 1303   MCV 85 06/23/2023 1303    CMP     Component Value Date/Time   NA 141 06/23/2023 1303   K 4.0 06/23/2023 1303   CL 100 06/23/2023 1303   CO2 27 04/13/2023 1450   GLUCOSE 78 06/23/2023 1303   GLUCOSE 112 (H) 04/13/2023 1450   BUN 8 06/23/2023 1303   CREATININE 0.77 06/23/2023  1303   CALCIUM  9.4 06/23/2023 1303   PROT 7.2 06/23/2023 1303   ALBUMIN 4.4 06/23/2023 1303   AST 16 06/23/2023 1303   ALT 20 06/01/2022 0329   ALKPHOS 124 (H) 06/23/2023 1303   BILITOT 0.4 06/23/2023 1303   GFRNONAA >60 04/13/2023 1450   GFRAA >60 02/07/2018 1101    Assessment and Plan:   Shawnie Nicole is a 53 y.o. y/o female has been referred for:   1.  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  She had EGD 1 year ago at [facility] medical GI in [facility].  Noncompliant with taking GERD medication. - I am requesting GI records from [facility] in [facility].  Request all EGD and colonoscopy reports in the past 10 years with pathology. - Treatment of GERD discussed at length. - Encouraged her to take omeprazole  40 mg once daily every day consistently, 30 minutes before meal. - She can also take OTC antacid or Pepcid  as needed for breakthrough GERD symptoms. - Recommend Lifestyle Modifications to prevent Acid Reflux.  Rec. Avoid coffee, sodas, peppermint, garlic, onions, alcohol, citrus fruits, chocolate, tomatoes, fatty and spicey foods.  Avoid eating 2-3 hours before bedtime.    2.  Colon cancer screening: Negative colonoscopy in 2015.  She reports having another colonoscopy done in [facility] by Dr. Kristie a few years ago, and I am requesting GI records. - Request all colonoscopy and EGD records from Dr. Kristie in [place]. - After GI records are reviewed, then we can decide appropriate timing for next colonoscopy.  3.  Asthmatic bronchitis  with shortness of breath -I encouraged patient to follow-up with her PCP and get referral to pulmonologist.   Follow up in 6 weeks with TG.  Ellouise Console, PA-C

## 2023-11-09 NOTE — Patient Instructions (Signed)
 Please follow up sooner if symptoms increase or worsen  Due to recent changes in healthcare laws, you may see the results of your imaging and laboratory studies on MyChart before your provider has had a chance to review them.  We understand that in some cases there may be results that are confusing or concerning to you. Not all laboratory results come back in the same time frame and the provider may be waiting for multiple results in order to interpret others.  Please give us  48 hours in order for your provider to thoroughly review all the results before contacting the office for clarification of your results.   Thank you for trusting me with your gastrointestinal care!   Ellouise Console, PA-C _______________________________________________________  If your blood pressure at your visit was 140/90 or greater, please contact your primary care physician to follow up on this.  _______________________________________________________  If you are age 54 or older, your body mass index should be between 23-30. Your Body mass index is 44.41 kg/m. If this is out of the aforementioned range listed, please consider follow up with your Primary Care Provider.  If you are age 17 or younger, your body mass index should be between 19-25. Your Body mass index is 44.41 kg/m. If this is out of the aformentioned range listed, please consider follow up with your Primary Care Provider.   ________________________________________________________  The [facility] providers would like to encourage you to use MYCHART to communicate with providers for non-urgent requests or questions.  Due to long hold times on the telephone, sending your provider a message by [facility] may be a faster and more efficient way to get a response.  Please allow 48 business hours for a response.  Please remember that this is for non-urgent requests.  _______________________________________________________

## 2023-11-23 ENCOUNTER — Telehealth: Payer: Self-pay | Admitting: Physician Assistant

## 2023-11-23 NOTE — Telephone Encounter (Signed)
 I received and reviewed patient's medical records from [facility].   11/2021 last colonoscopy showed: 10 mm benign colon mucosal polyp removed from transverse colon and 3 mm tubular adenoma polyp removed from rectum.  Small internal hemorrhoids.  5-year repeat colonoscopy will be due 11/2026.  Please put in colonoscopy recall.   06/2022 last EGD showed LA grade B esophagitis.  Medium hiatal hernia.  Previous gastric ulcer had healed.  Gastritis.  Biopsies negative for H. pylori or dysplasia.  No follow-up EGD was recommended.  Continue current plan to take omeprazole  40 mg once daily every day for chronic GERD.   Ellouise Console, PA-C

## 2023-12-14 ENCOUNTER — Ambulatory Visit: Admitting: Family Medicine

## 2023-12-14 ENCOUNTER — Telehealth: Payer: Self-pay | Admitting: Family Medicine

## 2023-12-14 NOTE — Telephone Encounter (Signed)
 Called pt and left vm to call office back. Pt missed acute appt; will need to reschedule with pcp at [facility]

## 2024-02-08 ENCOUNTER — Ambulatory Visit: Attending: Family Medicine | Admitting: Family Medicine

## 2024-02-08 ENCOUNTER — Emergency Department (HOSPITAL_COMMUNITY): Admission: EM | Admit: 2024-02-08 | Discharge: 2024-02-08 | Discharge status: Against Medical Advice

## 2024-02-08 ENCOUNTER — Encounter: Payer: Self-pay | Admitting: Family Medicine

## 2024-02-08 VITALS — BP 139/84 | HR 97 | Temp 98.2°F | Ht 60.0 in | Wt 230.6 lb

## 2024-02-08 DIAGNOSIS — H527 Unspecified disorder of refraction: Secondary | ICD-10-CM

## 2024-02-08 DIAGNOSIS — F419 Anxiety disorder, unspecified: Secondary | ICD-10-CM

## 2024-02-08 DIAGNOSIS — Z13228 Encounter for screening for other metabolic disorders: Secondary | ICD-10-CM | POA: Diagnosis not present

## 2024-02-08 DIAGNOSIS — R1084 Generalized abdominal pain: Secondary | ICD-10-CM | POA: Diagnosis not present

## 2024-02-08 DIAGNOSIS — G44209 Tension-type headache, unspecified, not intractable: Secondary | ICD-10-CM

## 2024-02-08 DIAGNOSIS — R9431 Abnormal electrocardiogram [ECG] [EKG]: Secondary | ICD-10-CM | POA: Diagnosis not present

## 2024-02-08 DIAGNOSIS — I10 Essential (primary) hypertension: Secondary | ICD-10-CM | POA: Diagnosis not present

## 2024-02-08 DIAGNOSIS — R0789 Other chest pain: Secondary | ICD-10-CM | POA: Diagnosis not present

## 2024-02-08 MED ORDER — AMLODIPINE BESYLATE 10 MG PO TABS: 10.0000 mg | ORAL_TABLET | Freq: Every day | ORAL | 1 refills | Status: AC

## 2024-02-08 MED ORDER — HYDROXYZINE HCL 25 MG PO TABS: 25.0000 mg | ORAL_TABLET | Freq: Every evening | ORAL | 0 refills | Status: DC | PRN

## 2024-02-08 NOTE — Progress Notes (Signed)
 "  Subjective:  Patient ID: Briana Wade, female    DOB: Apr 09, 1970  Age: 54 y.o. MRN: [id number]  CC: Medical Management of Chronic Issues (Abdominal pain, referral to gastro/Headaches/Wants to have thyroid  levels checked)     Discussed the use of AI scribe software for clinical note transcription with the patient, who gave verbal consent to proceed.  History of Present Illness Briana Wade is a 54 year old female with a history of hypertension, hyperlipidemia, asthma who presents with chest discomfort and abdominal pain.  She describes chest discomfort as a grabbing pressure in the chest that led her to the ER earlier this morning.  She left prior to being evaluated at the ED.  She has had similar episodes before but did not seek care. She is worried about fluid around her heart.  She has intermittent abdominal pain, often after eating certain foods. The pain is mild but abnormal for her.  Nuys presence of nausea, vomiting, diarrhea, hematochezia, excessive burping or bloating, reflux.  Her mother died from pancreatic cancer, which increases her concern about these symptoms. She had a colonoscopy several years ago and feels she may be due for repeat screening. She is interested in an endoscopy to evaluate her symptoms. Last colonoscopy was at [facility] in 2023 with 2 polyps resected and diverticulitis noted on exam.  Next colonoscopy recommended in 3 to 5 years per notes.  She has almost daily headaches that worsen with light and improve with covering her head or wearing sunglasses. She denies nausea or vomiting. She has migraines in the past but has not had a recent migraine-level episode and thinks the current headaches may be related to needing glasses.  She has difficulty sleeping and often uses sleeping pills to fall asleep. She lives alone and does not know if she snores She had a thyroid  ultrasound in July of last year because she thought she had an enlarged  thyroid  and would like repeat thyroid  blood tests again today even though her last thyroid  labs last year were normal because she feels 'something is wrong'.    Past Medical History:  Diagnosis Date   Abdominal pain, unspecified site    Allergy     Anemia    Asthma    Chlamydia    Diverticulitis    Esophageal reflux    Hiatal hernia    Hypertension    IBS (irritable bowel syndrome)    Insomnia    Lactose intolerance    Migraine equivalent 08/2014   minor right arm weakness in [facility]. neg CVA on imaging.    Other constipation    Personal history of colonic polyps 09/12/2010   tubular adenoma   Trichimoniasis    Tubular adenoma of colon    Umbilical hernia    Uterine fibroid    Uterine leiomyoma     Past Surgical History:  Procedure Laterality Date   APPENDECTOMY     COLONOSCOPY     DILATION AND CURETTAGE OF UTERUS     x2 for EAB   HYSTEROSCOPY WITH NOVASURE N/A 12/17/2015   Procedure: HYSTEROSCOPY WITH NOVASURE;  Surgeon: Bebe Furry, MD;  Location: [facility];  Service: Gynecology;  Laterality: N/A;   TUBAL LIGATION      Family History  Problem Relation Age of Onset   Hypertension Mother    Pancreatic cancer Mother    Asthma Son    Asthma Cousin    Colon cancer Neg Hx    Esophageal cancer Neg Hx  Rectal cancer Neg Hx    Stomach cancer Neg Hx     Social History   Socioeconomic History   Marital status: Legally Separated    Spouse name: Not on file   Number of children: 3   Years of education: Not on file   Highest education level: Not on file  Occupational History   Occupation: Unemployed  Tobacco Use   Smoking status: Never   Smokeless tobacco: Never  Vaping Use   Vaping status: Never Used  Substance and Sexual Activity   Alcohol use: No   Drug use: No   Sexual activity: Yes    Birth control/protection: Surgical  Other Topics Concern   Not on file  Social History Narrative   ** Merged History Encounter **       Social Drivers of Health    Tobacco Use: Low Risk (02/08/2024)   Patient History    Smoking Tobacco Use: Never    Smokeless Tobacco Use: Never    Passive Exposure: Not on file  Financial Resource Strain: Not on file  Food Insecurity: Food Insecurity Present (06/02/2023)   Hunger Vital Sign    Worried About Radiation Protection Practitioner of Food in the Last Year: Often true    Ran Out of Food in the Last Year: Often true  Transportation Needs: No Transportation Needs (06/02/2023)   PRAPARE - Administrator, Civil Service (Medical): No    Lack of Transportation (Non-Medical): No  Physical Activity: Not on file  Stress: Not on file  Social Connections: Not on file  Depression (PHQ2-9): High Risk (02/08/2024)   Depression (PHQ2-9)    PHQ-2 Score: 15  Alcohol Screen: Not on file  Housing: High Risk (06/02/2023)   Housing Stability Vital Sign    Unable to Pay for Housing in the Last Year: Yes    Number of Times Moved in the Last Year: 0    Homeless in the Last Year: No  Utilities: Not At Risk (06/02/2023)   AHC Utilities    Threatened with loss of utilities: No  Health Literacy: Not on file    Allergies[1]  Outpatient Medications Prior to Visit  Medication Sig Dispense Refill   amLODipine  (NORVASC ) 10 MG tablet Take 1 tablet (10 mg total) by mouth daily. 90 tablet 1   albuterol  (VENTOLIN  HFA) 108 (90 Base) MCG/ACT inhaler Inhale 2 puffs into the lungs every 4 (four) hours as needed for wheezing or shortness of breath. (Patient not taking: Reported on 02/08/2024) 18 g 0   atorvastatin  (LIPITOR) 10 MG tablet Take 1 tablet (10 mg total) by mouth daily. (Patient not taking: Reported on 02/08/2024) 30 tablet 2   budesonide -formoterol  (SYMBICORT ) 160-4.5 MCG/ACT inhaler Inhale 2 puffs into the lungs 2 (two) times daily. (Patient not taking: Reported on 02/08/2024) 10.2 g 3   cetirizine  (ZYRTEC  ALLERGY ) 10 MG tablet Take 1 tablet (10 mg total) by mouth daily. (Patient not taking: Reported on 02/08/2024) 30 tablet 11   cyclobenzaprine   (FLEXERIL ) 5 MG tablet Take 1 tablet (5 mg total) by mouth every 8 (eight) hours as needed for muscle spasms. (Patient not taking: Reported on 02/08/2024) 30 tablet 0   fluticasone  (FLONASE ) 50 MCG/ACT nasal spray Place 2 sprays into both nostrils daily. (Patient not taking: Reported on 02/08/2024) 16 g 6   omeprazole  (PRILOSEC) 40 MG capsule Take 40 mg by mouth daily. (Patient not taking: Reported on 02/08/2024)     sertraline  (ZOLOFT ) 50 MG tablet Take 1 tablet (50 mg total)  by mouth daily. (Patient not taking: Reported on 02/08/2024) 30 tablet 3   No facility-administered medications prior to visit.     ROS Review of Systems  Constitutional:  Positive for fatigue. Negative for activity change and appetite change.  HENT:  Negative for sinus pressure and sore throat.   Respiratory:  Negative for chest tightness, shortness of breath and wheezing.   Cardiovascular:  Negative for chest pain and palpitations.  Gastrointestinal:  Positive for abdominal distention. Negative for abdominal pain and constipation.  Genitourinary: Negative.   Musculoskeletal: Negative.   Neurological:  Positive for headaches.  Psychiatric/Behavioral:  Positive for sleep disturbance. Negative for behavioral problems and dysphoric mood.     Objective:  BP 139/84   Pulse 97   Temp 98.2 F (36.8 C) (Oral)   Ht 5' (1.524 m)   Wt 230 lb 9.6 oz (104.6 kg)   SpO2 97%   BMI 45.04 kg/m      02/08/2024    2:49 PM 02/08/2024    2:06 PM 11/09/2023    2:18 PM  BP/Weight  Systolic BP 139 155 130  Diastolic BP 84 95 82  Wt. (Lbs)  230.6 227.38  BMI  45.04 kg/m2 44.41 kg/m2      Physical Exam Constitutional:      Appearance: She is well-developed.  Cardiovascular:     Rate and Rhythm: Normal rate.     Heart sounds: Normal heart sounds. No murmur heard. Pulmonary:     Effort: Pulmonary effort is normal.     Breath sounds: Normal breath sounds. No wheezing or rales.  Chest:     Chest wall: No tenderness.   Abdominal:     General: Bowel sounds are normal. There is no distension.     Palpations: Abdomen is soft. There is no mass.     Tenderness: There is no abdominal tenderness.  Musculoskeletal:        General: Normal range of motion.     Right lower leg: No edema.     Left lower leg: No edema.  Neurological:     Mental Status: She is alert and oriented to person, place, and time.  Psychiatric:        Mood and Affect: Mood normal.        Latest Ref Rng & Units 06/23/2023    1:03 PM 04/13/2023    2:50 PM 06/06/2022    1:08 PM  CMP  Glucose 70 - 99 mg/dL 78  887    BUN 6 - 24 mg/dL 8  13    Creatinine 9.42 - 1.00 mg/dL 9.22  9.10  9.30   Sodium 134 - 144 mmol/L 141  137    Potassium 3.5 - 5.2 mmol/L 4.0  3.8    Chloride 96 - 106 mmol/L 100  103    CO2 22 - 32 mmol/L  27    Calcium  8.7 - 10.2 mg/dL 9.4  9.3    Total Protein 6.0 - 8.5 g/dL 7.2     Total Bilirubin 0.0 - 1.2 mg/dL 0.4     Alkaline Phos 44 - 121 IU/L 124     AST 0 - 40 IU/L 16       Lipid Panel     Component Value Date/Time   CHOL 219 (H) 06/23/2023 1303   TRIG 141 06/23/2023 1303   HDL 50 06/23/2023 1303   CHOLHDL 4.4 06/23/2023 1303   LDLCALC 144 (H) 06/23/2023 1303    CBC    Component Value  Date/Time   WBC 4.9 06/23/2023 1303   WBC 6.0 04/13/2023 1450   RBC 4.69 06/23/2023 1303   RBC 4.56 04/13/2023 1450   HGB 12.8 06/23/2023 1303   HCT 39.8 06/23/2023 1303   PLT 280 06/23/2023 1303   MCV 85 06/23/2023 1303   MCH 27.3 06/23/2023 1303   MCH 27.0 04/13/2023 1450   MCHC 32.2 06/23/2023 1303   MCHC 31.3 04/13/2023 1450   RDW 13.6 06/23/2023 1303   LYMPHSABS 1.5 06/23/2023 1303   MONOABS 0.9 06/02/2022 1130   EOSABS 0.2 06/23/2023 1303   BASOSABS 0.0 06/23/2023 1303    Lab Results  Component Value Date   HGBA1C 5.4 03/26/2020   Lab Results  Component Value Date   TSH 2.620 06/23/2023        Assessment & Plan Chest discomfort  Intermittent chest discomfort with EKG showing slight ST  segment depression in lateral leads -Need to rule out possible ischemia. - Ordered stress test to evaluate -Anxiety could also be playing a role - Referred to cardiologist for further evaluation.  Primary hypertension Blood pressure slightly elevated but improved upon repeat. No changes to antihypertensive regimen necessary. -Counseled on blood pressure goal of less than 130/80, low-sodium, DASH diet, medication compliance, 150 minutes of moderate intensity exercise per week. Discussed medication compliance, adverse effects.   Tension-type headache Daily headaches, possibly tension-type, relieved by pressure or wearing glasses. Possible need for corrective lenses. - Referred to ophthalmologist for eye examination. - Low suspicion for migraines but if symptoms persist consider migraine treatment  Anxiety disorder Anxiety may contribute to symptoms. Discussed potential use of anxiety medication before bed, but she prefers non-medication management. - Offered anxiety medication for use before bed if needed. - Prescribed hydroxyzine  as needed  Screening for metabolic disorder Previous thyroid  ultrasound showed nonspecific diffuse changes. Thyroid  function needs re-evaluation. - Ordered thyroid  function tests.  Abdominal pain Intermittent abdominal pain worsened after meals.  No red flags on history or exam. Family history of pancreatic cancer raises concern patient would like to be seen by GI - Referred to gastroenterologist for further evaluation.  General health maintenance Last colonoscopy was at [facility] in 2023 with next colonoscopy recommended in 3-5 years.       Meds ordered this encounter  Medications   amLODipine  (NORVASC ) 10 MG tablet    Sig: Take 1 tablet (10 mg total) by mouth daily.    Dispense:  90 tablet    Refill:  1   hydrOXYzine  (ATARAX ) 25 MG tablet    Sig: Take 1 tablet (25 mg total) by mouth at bedtime as needed for anxiety.    Dispense:  30  tablet    Refill:  0    Follow-up: Return in about 6 months (around 08/07/2024) for Chronic medical conditions.       Corrina Sabin, MD, FAAFP. [facility] [place], [place] [phone]   02/08/2024, 4:31 PM    [1]  Allergies Allergen Reactions   Lisinopril Cough   Lactose Intolerance (Gi)    "

## 2024-02-08 NOTE — Patient Instructions (Signed)
 VISIT SUMMARY:  During your visit, we discussed your chest discomfort, abdominal pain, headaches, anxiety, and thyroid  health. We also reviewed your general health maintenance needs.  YOUR PLAN:  -CHEST DISCOMFORT WITH POSSIBLE MYOCARDIAL ISCHEMIA: Your chest discomfort may be due to reduced blood flow to the heart. We have ordered a stress test to evaluate your heart's blood supply and referred you to a cardiologist for further evaluation.  -PRIMARY HYPERTENSION: Your blood pressure is slightly elevated but has improved. No changes to your current blood pressure medication are necessary at this time.  -TENSION-TYPE HEADACHE: Your daily headaches may be tension-type headaches, which are often relieved by pressure or wearing glasses. We have referred you to an ophthalmologist for an eye examination to determine if you need corrective lenses.  -ANXIETY DISORDER: Your anxiety may be contributing to your symptoms, including difficulty sleeping. We discussed the potential use of anxiety medication before bed, but you prefer non-medication management. Anxiety medication is available if needed.  -ANTERIOR NECK SWELLING WITH THYROID  DYSFUNCTION EVALUATION: You have had previous thyroid  ultrasound showing nonspecific changes. We have ordered thyroid  function tests to re-evaluate your thyroid  health.  -ABDOMINAL PAIN WITH GASTROINTESTINAL EVALUATION: Your intermittent abdominal pain, especially after meals, and family history of pancreatic cancer are concerning. We have referred you to a gastroenterologist for further evaluation.  -GENERAL HEALTH MAINTENANCE: Due to your family history of pancreatic cancer and previous colonic polyps, we recommend a colonoscopy to screen for any potential issues.  INSTRUCTIONS:  Please follow up with the cardiologist for your chest discomfort and complete the stress test as ordered. Schedule an appointment with the ophthalmologist for an eye examination. Follow up with  the gastroenterologist for your abdominal pain evaluation. Complete the thyroid  function tests as ordered. Schedule a colonoscopy for routine screening.

## 2024-02-08 NOTE — ED Notes (Signed)
 Patient asking for wait time. RN told patient RN unable to give wait time. Patient decided to leave.

## 2024-02-09 ENCOUNTER — Ambulatory Visit: Admitting: Physician Assistant

## 2024-02-09 ENCOUNTER — Ambulatory Visit: Payer: Self-pay | Admitting: Family Medicine

## 2024-02-09 LAB — CBC WITH DIFFERENTIAL/PLATELET
Basophils Absolute: 0 x10E3/uL (ref 0.0–0.2)
Basos: 1 %
EOS (ABSOLUTE): 0.2 x10E3/uL (ref 0.0–0.4)
Eos: 3 %
Hematocrit: 40.4 % (ref 34.0–46.6)
Hemoglobin: 13 g/dL (ref 11.1–15.9)
Immature Grans (Abs): 0 x10E3/uL (ref 0.0–0.1)
Immature Granulocytes: 0 %
Lymphocytes Absolute: 1.5 x10E3/uL (ref 0.7–3.1)
Lymphs: 21 %
MCH: 27.1 pg (ref 26.6–33.0)
MCHC: 32.2 g/dL (ref 31.5–35.7)
MCV: 84 fL (ref 79–97)
Monocytes Absolute: 0.3 x10E3/uL (ref 0.1–0.9)
Monocytes: 5 %
Neutrophils Absolute: 5.1 x10E3/uL (ref 1.4–7.0)
Neutrophils: 70 %
Platelets: 347 x10E3/uL (ref 150–450)
RBC: 4.79 x10E6/uL (ref 3.77–5.28)
RDW: 12.8 % (ref 11.7–15.4)
WBC: 7.2 x10E3/uL (ref 3.4–10.8)

## 2024-02-09 LAB — TSH: TSH: 2.02 u[IU]/mL (ref 0.450–4.500)

## 2024-02-09 LAB — CMP14+EGFR
ALT: 15 IU/L (ref 0–32)
AST: 22 IU/L (ref 0–40)
Albumin: 4.5 g/dL (ref 3.8–4.9)
Alkaline Phosphatase: 133 IU/L (ref 49–135)
BUN/Creatinine Ratio: 14 (ref 9–23)
BUN: 11 mg/dL (ref 6–24)
Bilirubin Total: 0.3 mg/dL (ref 0.0–1.2)
CO2: 27 mmol/L (ref 20–29)
Calcium: 9.6 mg/dL (ref 8.7–10.2)
Chloride: 99 mmol/L (ref 96–106)
Creatinine, Ser: 0.79 mg/dL (ref 0.57–1.00)
Globulin, Total: 3 g/dL (ref 1.5–4.5)
Glucose: 125 mg/dL — ABNORMAL HIGH (ref 70–99)
Potassium: 4.6 mmol/L (ref 3.5–5.2)
Sodium: 138 mmol/L (ref 134–144)
Total Protein: 7.5 g/dL (ref 6.0–8.5)
eGFR: 89 mL/min/1.73

## 2024-02-09 LAB — T3: T3, Total: 122 ng/dL (ref 71–180)

## 2024-02-09 LAB — T4, FREE: Free T4: 1.04 ng/dL (ref 0.82–1.77)

## 2024-02-10 NOTE — Progress Notes (Unsigned)
 "  Office Visit    Patient Name: Briana Wade Date of Encounter: 02/10/2024  Primary Care Provider:  Delbert Clam, MD Primary Cardiologist:  None  Chief Complaint    ***  Past Medical History    Past Medical History:  Diagnosis Date   Abdominal pain, unspecified site    Allergy     Anemia    Asthma    Chlamydia    Diverticulitis    Esophageal reflux    Hiatal hernia    Hypertension    IBS (irritable bowel syndrome)    Insomnia    Lactose intolerance    Migraine equivalent 08/2014   minor right arm weakness in [facility]. neg CVA on imaging.    Other constipation    Personal history of colonic polyps 09/12/2010   tubular adenoma   Trichimoniasis    Tubular adenoma of colon    Umbilical hernia    Uterine fibroid    Uterine leiomyoma    Past Surgical History:  Procedure Laterality Date   APPENDECTOMY     COLONOSCOPY     DILATION AND CURETTAGE OF UTERUS     x2 for EAB   HYSTEROSCOPY WITH NOVASURE N/A 12/17/2015   Procedure: HYSTEROSCOPY WITH NOVASURE;  Surgeon: Bebe Furry, MD;  Location: [facility];  Service: Gynecology;  Laterality: N/A;   TUBAL LIGATION      Allergies  Allergies[1]   Labs/Other Studies Reviewed    The following studies were reviewed today: ***    Recent Labs: 02/08/2024: ALT 15; BUN 11; Creatinine, Ser 0.79; Hemoglobin 13.0; Platelets 347; Potassium 4.6; Sodium 138; TSH 2.020  Recent Lipid Panel    Component Value Date/Time   CHOL 219 (H) 06/23/2023 1303   TRIG 141 06/23/2023 1303   HDL 50 06/23/2023 1303   CHOLHDL 4.4 06/23/2023 1303   LDLCALC 144 (H) 06/23/2023 1303    History of Present Illness    54 year old female with a history of hypertension, hyperlipidemia, asthma and anxiety who presents for heart first clinic new patient evaluation.  Previously evaluated by cardiology, Dr. Thomasena of [facility] cardiovascular in the setting of chest pain, last seen 2022.  Ischemic evaluation was deferred due to financial  constraints.  She saw her PCP on 02/08/2024 and reported intermittent chest discomfort (she presented to the ED prior to appt, but left prior to being seen due to wait times).  EKG showed ST segment depression in lateral leads.  She was referred to cardiology for further evaluation.  She presents today for heart first clinic new patient evaluation.   CC: -Initial onset:  -Frequency/Duration:  -Associated symptoms:  -Aggravating/alleviating factors:  -Syncope/near syncope: -Prior cardiac history: -Prior ECG:  -Prior workup: -Prior treatment:  -Possible medication interactions:  -Caffeine:  -Alcohol:  -Tobacco: -OTC supplements:  -Comorbidities:  -Exercise level:  -Labs:  -Cardiac ROS: no chest pain, no shortness of breath, no PND, no orthopnea, no LE edema. She denies any headaches. -Family history:   -Specialists:   Home Medications    Current Outpatient Medications  Medication Sig Dispense Refill   albuterol  (VENTOLIN  HFA) 108 (90 Base) MCG/ACT inhaler Inhale 2 puffs into the lungs every 4 (four) hours as needed for wheezing or shortness of breath. (Patient not taking: Reported on 02/08/2024) 18 g 0   amLODipine  (NORVASC ) 10 MG tablet Take 1 tablet (10 mg total) by mouth daily. 90 tablet 1   atorvastatin  (LIPITOR) 10 MG tablet Take 1 tablet (10 mg total) by mouth daily. (Patient not taking: Reported on  02/08/2024) 30 tablet 2   budesonide -formoterol  (SYMBICORT ) 160-4.5 MCG/ACT inhaler Inhale 2 puffs into the lungs 2 (two) times daily. (Patient not taking: Reported on 02/08/2024) 10.2 g 3   cetirizine  (ZYRTEC  ALLERGY ) 10 MG tablet Take 1 tablet (10 mg total) by mouth daily. (Patient not taking: Reported on 02/08/2024) 30 tablet 11   cyclobenzaprine  (FLEXERIL ) 5 MG tablet Take 1 tablet (5 mg total) by mouth every 8 (eight) hours as needed for muscle spasms. (Patient not taking: Reported on 02/08/2024) 30 tablet 0   fluticasone  (FLONASE ) 50 MCG/ACT nasal spray Place 2 sprays into both  nostrils daily. (Patient not taking: Reported on 02/08/2024) 16 g 6   hydrOXYzine  (ATARAX ) 25 MG tablet Take 1 tablet (25 mg total) by mouth at bedtime as needed for anxiety. 30 tablet 0   omeprazole  (PRILOSEC) 40 MG capsule Take 40 mg by mouth daily. (Patient not taking: Reported on 02/08/2024)     sertraline  (ZOLOFT ) 50 MG tablet Take 1 tablet (50 mg total) by mouth daily. (Patient not taking: Reported on 02/08/2024) 30 tablet 3   No current facility-administered medications for this visit.     Review of Systems    ***.  All other systems reviewed and are otherwise negative except as noted above.    Physical Exam    VS:  There were no vitals taken for this visit. , BMI There is no height or weight on file to calculate BMI.     GEN: Well nourished, well developed, in no acute distress. HEENT: normal. Neck: Supple, no JVD, carotid bruits, or masses. Cardiac: RRR, no murmurs, rubs, or gallops. No clubbing, cyanosis, edema.  Radials/DP/PT 2+ and equal bilaterally.  Respiratory:  Respirations regular and unlabored, clear to auscultation bilaterally. GI: Soft, nontender, nondistended, BS + x 4. MS: no deformity or atrophy. Skin: warm and dry, no rash. Neuro:  Strength and sensation are intact. Psych: Normal affect.  Accessory Clinical Findings    ECG personally reviewed by me today -    - no acute changes.   Lab Results  Component Value Date   WBC 7.2 02/08/2024   HGB 13.0 02/08/2024   HCT 40.4 02/08/2024   MCV 84 02/08/2024   PLT 347 02/08/2024   Lab Results  Component Value Date   CREATININE 0.79 02/08/2024   BUN 11 02/08/2024   NA 138 02/08/2024   K 4.6 02/08/2024   CL 99 02/08/2024   CO2 27 02/08/2024   Lab Results  Component Value Date   ALT 15 02/08/2024   AST 22 02/08/2024   ALKPHOS 133 02/08/2024   BILITOT 0.3 02/08/2024   Lab Results  Component Value Date   CHOL 219 (H) 06/23/2023   HDL 50 06/23/2023   LDLCALC 144 (H) 06/23/2023   TRIG 141 06/23/2023    CHOLHDL 4.4 06/23/2023    Lab Results  Component Value Date   HGBA1C 5.4 03/26/2020    Assessment & Plan    1.  ***  No BP recorded.  {Refresh Note OR Click here to enter BP  :1}***   Damien JAYSON Braver, NP 02/10/2024, 4:15 PM       [1]  Allergies Allergen Reactions   Lisinopril Cough   Lactose Intolerance (Gi)    "

## 2024-02-10 NOTE — Progress Notes (Addendum)
 Pt attended 06/02/2023 screening event with BP of 153/86 and blood sugar was 104. At event pt did indicate food, housing, & IPV SDOH needs. Pt also noted that she is not a smoker and listed Medicaid as her insurance at the event.   Per 6 month f/u pt was reached out via phone and left vm.  Per chart review pt does have a PCP (Briana Wade;  [facility]), insurance, and is not a smoker. Pt's last appt with PCP was 02/08/2024 and has an upcoming appt on 08/07/2024. Pt does indicate food and housing SDOH needs at this time.  02/10/2024 (UPDATE 3:22PM) - Pt called back and stated she would like to be sent specific resources for help with paying rent & updated food resources. Pt gave verbal consent to being sent resources via FindHelp.  No additional pt f/u to be scheduled at this time per health equity protocol.

## 2024-02-11 ENCOUNTER — Ambulatory Visit: Admitting: Nurse Practitioner
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
## 2024-02-14 ENCOUNTER — Encounter: Payer: Self-pay | Admitting: General Practice

## 2024-02-14 ENCOUNTER — Ambulatory Visit: Attending: General Practice | Admitting: General Practice

## 2024-02-14 VITALS — BP 160/104 | HR 100 | Ht 61.0 in | Wt 232.0 lb

## 2024-02-14 DIAGNOSIS — E782 Mixed hyperlipidemia: Secondary | ICD-10-CM | POA: Insufficient documentation

## 2024-02-14 DIAGNOSIS — K219 Gastro-esophageal reflux disease without esophagitis: Secondary | ICD-10-CM | POA: Insufficient documentation

## 2024-02-14 DIAGNOSIS — F419 Anxiety disorder, unspecified: Secondary | ICD-10-CM | POA: Diagnosis not present

## 2024-02-14 DIAGNOSIS — I10 Essential (primary) hypertension: Secondary | ICD-10-CM | POA: Diagnosis not present

## 2024-02-14 DIAGNOSIS — R0789 Other chest pain: Secondary | ICD-10-CM | POA: Insufficient documentation

## 2024-02-14 DIAGNOSIS — R072 Precordial pain: Secondary | ICD-10-CM | POA: Insufficient documentation

## 2024-02-14 MED ORDER — METOPROLOL TARTRATE 100 MG PO TABS: 100.0000 mg | ORAL_TABLET | Freq: Once | ORAL | 0 refills | Status: DC

## 2024-02-14 NOTE — Patient Instructions (Addendum)
 Medication Instructions:  Your physician recommends that you continue on your current medications as directed. Please refer to the Current Medication list given to you today.  *If you need a refill on your cardiac medications before your next appointment, please call your pharmacy*  Lab Work: None ordered  If you have labs (blood work) drawn today and your tests are completely normal, you will receive your results only by: MyChart Message (if you have MyChart) OR A paper copy in the mail If you have any lab test that is abnormal or we need to change your treatment, we will call you to review the results.  Testing/Procedures: Your physician has requested that you have an echocardiogram. Echocardiography is a painless test that uses sound waves to create images of your heart. It provides your doctor with information about the size and shape of your heart and how well your hearts chambers and valves are working. This procedure takes approximately one hour. There are no restrictions for this procedure. Please do NOT wear cologne, perfume, aftershave, or lotions (deodorant is allowed). Please arrive 15 minutes prior to your appointment time.  Please note: We ask at that you not bring children with you during ultrasound (echo/ vascular) testing. Due to room size and safety concerns, children are not allowed in the ultrasound rooms during exams. Our front office staff cannot provide observation of children in our lobby area while testing is being conducted. An adult accompanying a patient to their appointment will only be allowed in the ultrasound room at the discretion of the ultrasound technician under special circumstances. We apologize for any inconvenience.   Your physician has requested that you have cardiac CT. Cardiac computed tomography (CT) is a painless test that uses an x-ray machine to take clear, detailed pictures of your heart. For further information please visit [email or web address]  Please follow instruction sheet  BELOW:    Your cardiac CT will be scheduled at one of the below locations:   [facility] [address] [place], [place] [address] [phone] (Severe contrast allergies only)  OR   [facility] [address] [place], [place] [address] [phone]  OR   [facility] [address] [place], [place] [address] [phone]  OR   [facility] and Vascular Tower [address]  [place], [place] [address]  OR   [facility] [address] [place], [place] [phone]  If scheduled at [facility], please arrive at the [facility] and Children's Entrance (Entrance C2) of [facility] 30 minutes prior to test start time. You can use the FREE valet parking offered at entrance C (encouraged to control the heart rate for the test)  Proceed to the [facility] Radiology Department (first floor) to check-in and test prep.  All radiology patients and guests should use entrance C2 at [facility], accessed from [facility], even though the hospital's physical address listed is [address].  If scheduled at the Heart and Vascular Tower at [address], please enter the parking lot using the [address] entrance and use the FREE valet service at the patient drop-off area. Enter the building and check-in with registration on the main floor.  If scheduled at [facility], please arrive to the Heart and Vascular Center 15 mins early for check-in and test prep.  There is spacious parking and easy access to the radiology department from the [facility] Heart and Vascular entrance.  Please enter here and check-in with the desk attendant.   If scheduled at [facility], please arrive 30 minutes early for check-in and test prep.  Please follow these instructions carefully (unless otherwise directed):  An IV will be required  for this test and Nitroglycerin  will be given.    On the Night Before the Test: Be sure to Drink plenty of water. Do not consume any caffeinated/decaffeinated beverages or chocolate 12 hours prior to your test. Do not take any antihistamines 12 hours prior to your test.    On the Day of the Test: Drink plenty of water until 1 hour prior to the test. Do not eat any food 1 hour prior to test. You may take your regular medications prior to the test.  Take metoprolol  (Lopressor ) 100 MG two hours prior to test. THIS HAS BEEN SENT TO YOUR PHARMACY [facility]- please wear underwire-free bra if available, avoid dresses & tight clothing       After the Test: Drink plenty of water. After receiving IV contrast, you may experience a mild flushed feeling. This is normal. On occasion, you may experience a mild rash up to 24 hours after the test. This is not dangerous. If this occurs, you can take Benadryl  25 mg, Zyrtec , Claritin, or Allegra and increase your fluid intake. (Patients taking Tikosyn should avoid Benadryl , and may take Zyrtec , Claritin, or Allegra) If you experience trouble breathing, this can be serious. If it is severe call 911 IMMEDIATELY. If it is mild, please call our office.  We will call to schedule your test 2-4 weeks out understanding that some insurance companies will need an authorization prior to the service being performed.   For more information and frequently asked questions, please visit our website : [email or web address]  For non-scheduling related questions, please contact the cardiac imaging nurse navigator should you have any questions/concerns: Cardiac Imaging Nurse Navigators Direct Office Dial: [phone]   For scheduling needs, including cancellations and rescheduling, please call Brittany, [phone].    Your physician has requested that you have an echocardiogram. Echocardiography is a painless test that uses sound waves to create images of  your heart. It provides your doctor with information about the size and shape of your heart and how well your hearts chambers and valves are working. This procedure takes approximately one hour. There are no restrictions for this procedure. Please do NOT wear cologne, perfume, aftershave, or lotions (deodorant is allowed). Please arrive 15 minutes prior to your appointment time.  Please note: We ask at that you not bring children with you during ultrasound (echo/ vascular) testing. Due to room size and safety concerns, children are not allowed in the ultrasound rooms during exams. Our front office staff cannot provide observation of children in our lobby area while testing is being conducted. An adult accompanying a patient to their appointment will only be allowed in the ultrasound room at the discretion of the ultrasound technician under special circumstances. We apologize for any inconvenience.   Follow-Up: At [facility], you and your health needs are our priority.  As part of our continuing mission to provide you with exceptional heart care, our providers are all part of one team.  This team includes your primary Cardiologist (physician) and Advanced Practice Providers or APPs (Physician Assistants and Nurse Practitioners) who all work together to provide you with the care you need, when you need it.  Your next appointment:   2 month(s)  Provider:   Georganna Archer, MD or  Josefa Beauvais, NP          We recommend signing up for the patient portal called MyChart.  Sign up information is provided on this After Visit Summary.  MyChart is used to connect with patients for Virtual Visits (Telemedicine).  Patients are able to view lab/test results, encounter notes, upcoming appointments, etc.  Non-urgent messages can be sent to your provider as well.   To learn more about what you can do with MyChart, go to [email or web address].   Other Instructions

## 2024-02-22 ENCOUNTER — Encounter (HOSPITAL_COMMUNITY): Payer: Self-pay

## 2024-02-24 ENCOUNTER — Other Ambulatory Visit (HOSPITAL_COMMUNITY): Payer: Self-pay | Admitting: *Deleted

## 2024-02-24 ENCOUNTER — Ambulatory Visit (HOSPITAL_COMMUNITY)
Admission: RE | Admit: 2024-02-24 | Discharge: 2024-02-24 | Disposition: A | Discharge status: Home / Self Care | Source: Ambulatory Visit | Attending: Cardiology | Admitting: Cardiology

## 2024-02-24 DIAGNOSIS — R0789 Other chest pain: Secondary | ICD-10-CM

## 2024-02-24 DIAGNOSIS — R072 Precordial pain: Secondary | ICD-10-CM

## 2024-02-24 MED ORDER — NITROGLYCERIN 0.4 MG SL SUBL: 0.8000 mg | SUBLINGUAL_TABLET | Freq: Once | SUBLINGUAL | Status: DC

## 2024-02-24 MED ORDER — METOPROLOL TARTRATE 100 MG PO TABS: 100.0000 mg | ORAL_TABLET | Freq: Once | ORAL | 0 refills | Status: AC

## 2024-02-24 MED ORDER — DILTIAZEM HCL 25 MG/5ML IV SOLN: 10.0000 mg | INTRAVENOUS | Status: DC | PRN

## 2024-03-02 ENCOUNTER — Encounter (HOSPITAL_COMMUNITY): Payer: Self-pay

## 2024-03-03 ENCOUNTER — Ambulatory Visit (HOSPITAL_COMMUNITY)

## 2024-03-03 ENCOUNTER — Other Ambulatory Visit: Payer: Self-pay | Admitting: Family Medicine

## 2024-03-08 ENCOUNTER — Ambulatory Visit: Admitting: Physician Assistant

## 2024-03-15 ENCOUNTER — Ambulatory Visit (HOSPITAL_COMMUNITY)

## 2024-03-16 ENCOUNTER — Ambulatory Visit (HOSPITAL_COMMUNITY)

## 2024-04-14 ENCOUNTER — Ambulatory Visit: Admitting: Student in an Organized Health Care Education/Training Program

## 2024-08-07 ENCOUNTER — Ambulatory Visit: Payer: Self-pay | Admitting: Family Medicine
